# Patient Record
Sex: Female | Born: 1960 | Race: White | Hispanic: No | Marital: Married | Smoking: Never smoker
Health system: Southern US, Community
[De-identification: ages and names within clinical notes are randomized; demographics above are authoritative.]

## PROBLEM LIST (undated history)

## (undated) DIAGNOSIS — C50919 Malignant neoplasm of unspecified site of unspecified female breast: Secondary | ICD-10-CM

## (undated) DIAGNOSIS — D259 Leiomyoma of uterus, unspecified: Secondary | ICD-10-CM

## (undated) DIAGNOSIS — F419 Anxiety disorder, unspecified: Secondary | ICD-10-CM

## (undated) DIAGNOSIS — R9431 Abnormal electrocardiogram [ECG] [EKG]: Secondary | ICD-10-CM

## (undated) DIAGNOSIS — R002 Palpitations: Secondary | ICD-10-CM

## (undated) DIAGNOSIS — Z803 Family history of malignant neoplasm of breast: Secondary | ICD-10-CM

## (undated) DIAGNOSIS — Z973 Presence of spectacles and contact lenses: Secondary | ICD-10-CM

## (undated) DIAGNOSIS — W57XXXA Bitten or stung by nonvenomous insect and other nonvenomous arthropods, initial encounter: Secondary | ICD-10-CM

## (undated) DIAGNOSIS — E079 Disorder of thyroid, unspecified: Secondary | ICD-10-CM

## (undated) DIAGNOSIS — M1990 Unspecified osteoarthritis, unspecified site: Secondary | ICD-10-CM

## (undated) DIAGNOSIS — Z923 Personal history of irradiation: Secondary | ICD-10-CM

## (undated) DIAGNOSIS — E785 Hyperlipidemia, unspecified: Secondary | ICD-10-CM

## (undated) DIAGNOSIS — S70361A Insect bite (nonvenomous), right thigh, initial encounter: Secondary | ICD-10-CM

## (undated) DIAGNOSIS — R232 Flushing: Secondary | ICD-10-CM

## (undated) DIAGNOSIS — K219 Gastro-esophageal reflux disease without esophagitis: Secondary | ICD-10-CM

## (undated) HISTORY — DX: Family history of malignant neoplasm of breast: Z80.3

## (undated) HISTORY — DX: Unspecified osteoarthritis, unspecified site: M19.90

## (undated) HISTORY — DX: Leiomyoma of uterus, unspecified: D25.9

## (undated) HISTORY — DX: Insect bite (nonvenomous), right thigh, initial encounter: S70.361A

## (undated) HISTORY — DX: Palpitations: R00.2

## (undated) HISTORY — DX: Flushing: R23.2

## (undated) HISTORY — DX: Hyperlipidemia, unspecified: E78.5

## (undated) HISTORY — DX: Abnormal electrocardiogram (ECG) (EKG): R94.31

## (undated) HISTORY — DX: Bitten or stung by nonvenomous insect and other nonvenomous arthropods, initial encounter: W57.XXXA

## (undated) HISTORY — PX: DILATION AND CURETTAGE OF UTERUS: SHX78

## (undated) HISTORY — DX: Disorder of thyroid, unspecified: E07.9

## (undated) HISTORY — PX: THYROIDECTOMY, PARTIAL: SHX18

## (undated) HISTORY — DX: Malignant neoplasm of unspecified site of unspecified female breast: C50.919

## (undated) HISTORY — DX: Anxiety disorder, unspecified: F41.9

---

## 1999-05-02 HISTORY — PX: THYROIDECTOMY, PARTIAL: SHX18

## 2004-05-01 HISTORY — PX: ENDOMETRIAL ABLATION: SHX621

## 2007-05-02 DIAGNOSIS — R9431 Abnormal electrocardiogram [ECG] [EKG]: Secondary | ICD-10-CM

## 2007-05-02 DIAGNOSIS — R002 Palpitations: Secondary | ICD-10-CM

## 2007-05-02 HISTORY — DX: Palpitations: R00.2

## 2007-05-02 HISTORY — DX: Abnormal electrocardiogram (ECG) (EKG): R94.31

## 2008-04-16 ENCOUNTER — Ambulatory Visit: Payer: Self-pay | Admitting: Cardiology

## 2008-04-21 ENCOUNTER — Encounter: Payer: Self-pay | Admitting: Cardiology

## 2008-04-30 ENCOUNTER — Ambulatory Visit: Payer: Self-pay | Admitting: Cardiology

## 2008-05-25 ENCOUNTER — Ambulatory Visit: Payer: Self-pay | Admitting: Cardiology

## 2009-02-03 DIAGNOSIS — R5381 Other malaise: Secondary | ICD-10-CM

## 2009-02-03 DIAGNOSIS — R9431 Abnormal electrocardiogram [ECG] [EKG]: Secondary | ICD-10-CM | POA: Insufficient documentation

## 2009-02-03 DIAGNOSIS — R5383 Other fatigue: Secondary | ICD-10-CM

## 2009-02-03 DIAGNOSIS — R002 Palpitations: Secondary | ICD-10-CM

## 2009-02-04 ENCOUNTER — Ambulatory Visit: Payer: Self-pay | Admitting: Cardiology

## 2009-02-04 DIAGNOSIS — F411 Generalized anxiety disorder: Secondary | ICD-10-CM | POA: Insufficient documentation

## 2010-09-13 NOTE — Assessment & Plan Note (Signed)
[facility]                          [facility] OFFICE NOTE   NAME:[name], [name]                          MRN:          [id number]  DATE:05/25/2008                            DOB:          [birth year]    HISTORY OF PRESENT ILLNESS:  The patient is a pleasant 50 year old  female who we recently saw for palpitations.  The patient wore a 24-hour  Holter monitor which demonstrated PVCs, but no atrial fibrillation.  She  was also started on propranolol 40 mg b.i.d.  She has noticed  significant improvement in her palpitations.  She states they are far  less frequent and not as forceful in its nature.  She also is not able  to cope with this realizing that she has no underlying heart disease.  Her stress echocardiographic study was essentially within normal limits.   MEDICATIONS:  1. Citalopram 20 mg p.o. daily.  2. Propranolol 40 mg p.o. b.i.d.   PHYSICAL EXAMINATION:  VITAL SIGNS:  Blood pressure 105/63, heart rate  60, weight 145 pounds.  NECK:  Normal carotid upstroke and no carotid bruits.  LUNGS:  Clear breath sounds bilaterally.  HEART:  Regular rate and rhythm with normal S1 and S2.  No murmur, rubs,  or gallops.  ABDOMEN:  Soft.  EXTREMITIES:  No cyanosis, clubbing, or edema.   PROBLEM LIST:  1. Palpitations.      a.     Improved.      b.     Secondary to premature ventricular contractions.  2. Mild anxiety disorder/mood disorder, treated by her gynecologist      with selective serotonin reuptake inhibitor.  3. Nonspecific EKG abnormality.   PLAN:  1. The patient has no evidence of ischemia.  She has no significant      abnormalities by stress echo.  2. The patient has significantly improved in regard to her symptoms      with propranolol and will continue this for the next 6 months.  At      that point in time, we will try to wean her dose.  3. No further immediate followup is required, and the patient will      call us on an  as-needed basis.     Learta Codding, MD,FACC  Electronically Signed   GED/MedQ  DD: 05/25/2008  DT: 05/25/2008  Job #: [id number]

## 2010-09-13 NOTE — Assessment & Plan Note (Signed)
[facility]                          [facility] OFFICE NOTE   NAME:Maclay, Dazja                          MRN:          [id number]  DATE:04/16/2008                            DOB:          08-17-1960    REASON FOR CONSULTATION:  Palpitations.   HISTORY OF PRESENT ILLNESS:  The patient is a very pleasant 50 year old  female with no prior significant cardiovascular history.  The patient  states that over the last several months she has felt extremely tired  and weak.  She does not experience any hot flashes, however.  Her main  complaint has been palpitations that occurred since this summer.  She  states that at times she feels her heart racing which is usually short  lived without any other symptoms, but other times she feels flip-flop  of her heart with extra heartbeats and pauses suggesting PVCs.  She has  no chest pain, no shortness of breath, orthopnea, or PND.  She does  complain of generalized fatigue and not lack of energy.  She also  reports to me left arm pain radiating down the left arm, but this is  positional and not at all related to exertion.  She also reported  gastroesophageal reflux disease after she had a holiday party yesterday,  which is an unusual symptom for her.  She was referred to my office per  the request of Dr. Cleotis Nipper when she reported palpitations.   ALLERGIES:  No known drug allergies.   MEDICATIONS:  Citalopram 20 mg p.o. daily for mood disorder.   PAST MEDICAL HISTORY:  Partial thyroidectomy, C-section, endometrial  ablation.   SOCIAL HISTORY:  The patient's father had angina and had in 1986 bypass  surgery, eventually died from aortic aneurysm and also had significant  dementia.  Two maternal aunts and an uncle have atrial fibrillation.  Mother is still alive at age 38.   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  She states that she has been under significant amount of stress  over the  last couple of months.  This was the reason that she was started on  citalopram by her gynecologist.   REVIEW OF SYSTEMS:  As per HPI.  No nausea or vomiting.  No fevers,  chills, no melena, no hematuria, no dysuria, frequency, positive for  fatigue, and weakness.  No fever or chills.   PHYSICAL EXAMINATION:  VITAL SIGNS:  Blood pressure 139/85, heart rate  is 81 beats per minute, weight 143 pounds.  GENERAL:  Well-nourished, white female in no apparent distress.  HEENT:  Pupils are equal, conjunctivae clear.  NECK:  Supple.  No carotid bruits.  LUNGS:  Clear breath sounds bilaterally.  HEART:  Regular rate and rhythm with occasional extrasystoles, but  normal S1 and S2 with no pathological murmurs.  ABDOMEN:  Soft and nontender.  No rebound or guarding.  Good bowel  sound.  EXTREMITIES:  No cyanosis, clubbing, or edema.  NEUROLOGIC:  The patient is alert, oriented, and grossly nonfocal.   A 12-lead electrocardiogram demonstrates normal sinus rhythm  with  nonspecific ST-segment depression and occasional PVCs.   PROBLEM LIST:  1. Palpitations.      a.     Rule out atrial fibrillation, unlikely.      b.     Premature ventricular contractions, possibly related to       anxiety.  2. Mild anxiety disorder/mood disorder treated by her gynecologist.  3. Three abnormal electrocardiogram with ST depression.  4. Premature ventricular contractions.   PLAN:  1. I suspect the patient's symptoms may be fed by her anxiety.  She      mainly has PVCs documented, but we will apply a 24-hour Holter      monitor to make sure that she does not have any bursts of atrial      fibrillation.  2. We will start the patient a trial of propranolol 40 mg p.o. b.i.d.      which also would help with her anxiety.  3. Given her abnormal electrocardiogram, will undergo stress      echocardiographic testing, but I suspect these are nonspecific ST      segment changes that were seen on the  electrocardiogram.      Learta Codding, MD,FACC  Electronically Signed    GED/MedQ  DD: 04/16/2008  DT: 04/17/2008  Job #: [id number]   cc:   Zelphia Cairo, MD  Ceasar Mons A. Cleotis Nipper, M.D.

## 2010-10-27 ENCOUNTER — Encounter: Payer: Self-pay | Admitting: Cardiology

## 2011-01-26 NOTE — Patient Instructions (Addendum)
   Your procedure is scheduled on: Tuesday  Enter through the Main Entrance of [facility] at: 6am Pick up the phone at the desk and dial [phone] and inform us of your arrival  Please call this number if you have any problems the morning of surgery: [phone]  Remember: Do not eat food after midnight  Do not drink clear liquids after:midnight Take these medicines the morning of surgery with a SIP OF WATER:none  Do not wear jewelry, make-up, or FINGER nail polish Do not wear lotions, powders, or perfumes.  You may wear deodorant. Do not shave 48 hours prior to surgery. Do not bring valuables to the hospital. Leave suitcase in the car. After Surgery it may be brought to your room. For patients being admitted to the hospital, checkout time is 11:00am the day of discharge.     Remember to use your hibiclens as instructed.Please shower with 1/2 bottle the evening before your surgery and the other 1/2 bottle the morning of surgery.

## 2011-01-30 ENCOUNTER — Encounter (HOSPITAL_COMMUNITY): Payer: Self-pay

## 2011-01-30 ENCOUNTER — Encounter (HOSPITAL_COMMUNITY)
Admission: RE | Admit: 2011-01-30 | Discharge: 2011-01-30 | Disposition: A | Discharge status: Home / Self Care | Payer: BC Managed Care – PPO | Source: Ambulatory Visit | Attending: Obstetrics and Gynecology | Admitting: Obstetrics and Gynecology

## 2011-01-30 HISTORY — DX: Gastro-esophageal reflux disease without esophagitis: K21.9

## 2011-01-30 HISTORY — DX: Unspecified osteoarthritis, unspecified site: M19.90

## 2011-01-30 LAB — CBC
HCT: 39.3 % (ref 36.0–46.0)
MCHC: 34.6 g/dL (ref 30.0–36.0)
MCV: 94 fL (ref 78.0–100.0)
RDW: 12.5 % (ref 11.5–15.5)

## 2011-02-06 MED ORDER — DEXTROSE 5 % IV SOLN
1.0000 g | INTRAVENOUS | Status: AC
  Administered 2011-02-07: 1 g via INTRAVENOUS
  Filled 2011-02-06: qty 1

## 2011-02-07 ENCOUNTER — Encounter (HOSPITAL_COMMUNITY): Payer: Self-pay | Admitting: Obstetrics and Gynecology

## 2011-02-07 ENCOUNTER — Ambulatory Visit (HOSPITAL_COMMUNITY): Payer: BC Managed Care – PPO | Admitting: Anesthesiology

## 2011-02-07 ENCOUNTER — Encounter (HOSPITAL_COMMUNITY): Payer: Self-pay | Admitting: Anesthesiology

## 2011-02-07 ENCOUNTER — Encounter (HOSPITAL_COMMUNITY)
Admission: RE | Disposition: A | Discharge status: Home Health | Payer: Self-pay | Source: Ambulatory Visit | Attending: Obstetrics and Gynecology

## 2011-02-07 ENCOUNTER — Ambulatory Visit (HOSPITAL_COMMUNITY)
Admission: RE | Admit: 2011-02-07 | Discharge: 2011-02-08 | Disposition: A | Discharge status: Home Health | Payer: BC Managed Care – PPO | Source: Ambulatory Visit | Attending: Obstetrics and Gynecology | Admitting: Obstetrics and Gynecology

## 2011-02-07 ENCOUNTER — Other Ambulatory Visit: Payer: Self-pay | Admitting: Obstetrics and Gynecology

## 2011-02-07 DIAGNOSIS — N8000 Endometriosis of the uterus, unspecified: Secondary | ICD-10-CM | POA: Insufficient documentation

## 2011-02-07 DIAGNOSIS — Z01818 Encounter for other preprocedural examination: Secondary | ICD-10-CM | POA: Insufficient documentation

## 2011-02-07 DIAGNOSIS — Z01812 Encounter for preprocedural laboratory examination: Secondary | ICD-10-CM | POA: Insufficient documentation

## 2011-02-07 DIAGNOSIS — D251 Intramural leiomyoma of uterus: Secondary | ICD-10-CM | POA: Insufficient documentation

## 2011-02-07 DIAGNOSIS — D219 Benign neoplasm of connective and other soft tissue, unspecified: Secondary | ICD-10-CM

## 2011-02-07 DIAGNOSIS — D252 Subserosal leiomyoma of uterus: Secondary | ICD-10-CM | POA: Insufficient documentation

## 2011-02-07 HISTORY — PX: LAPAROSCOPIC ASSISTED VAGINAL HYSTERECTOMY: SHX5398

## 2011-02-07 HISTORY — PX: ABDOMINAL HYSTERECTOMY: SHX81

## 2011-02-07 LAB — TYPE AND SCREEN: Antibody Screen: NEGATIVE

## 2011-02-07 LAB — ABO/RH: ABO/RH(D): A POS

## 2011-02-07 SURGERY — HYSTERECTOMY, VAGINAL, LAPAROSCOPY-ASSISTED
Anesthesia: General

## 2011-02-07 MED ORDER — ONDANSETRON HCL 4 MG/2ML IJ SOLN: 4.0000 mg | Freq: Four times a day (QID) | INTRAMUSCULAR | Status: DC | PRN

## 2011-02-07 MED ORDER — LACTATED RINGERS IV SOLN
INTRAVENOUS | Status: DC
  Administered 2011-02-07 (×2): via INTRAVENOUS

## 2011-02-07 MED ORDER — DEXTROSE-NACL 5-0.9 % IV SOLN
INTRAVENOUS | Status: DC
  Administered 2011-02-07: 17:00:00 via INTRAVENOUS

## 2011-02-07 MED ORDER — NEOSTIGMINE METHYLSULFATE 1 MG/ML IJ SOLN
INTRAMUSCULAR | Status: DC | PRN
  Administered 2011-02-07: 2 mg via INTRAVENOUS

## 2011-02-07 MED ORDER — LACTATED RINGERS IR SOLN
Status: DC | PRN
  Administered 2011-02-07: 3000 mL

## 2011-02-07 MED ORDER — HYDROMORPHONE HCL 1 MG/ML IJ SOLN
1.0000 mg | INTRAMUSCULAR | Status: DC | PRN
  Administered 2011-02-07: 1 mg via INTRAVENOUS
  Filled 2011-02-07: qty 1

## 2011-02-07 MED ORDER — KETOROLAC TROMETHAMINE 30 MG/ML IJ SOLN
30.0000 mg | Freq: Four times a day (QID) | INTRAMUSCULAR | Status: AC
  Administered 2011-02-07: 30 mg via INTRAVENOUS
  Filled 2011-02-07: qty 1

## 2011-02-07 MED ORDER — SCOPOLAMINE 1 MG/3DAYS TD PT72
MEDICATED_PATCH | TRANSDERMAL | Status: AC
  Filled 2011-02-07: qty 1

## 2011-02-07 MED ORDER — DEXAMETHASONE SODIUM PHOSPHATE 10 MG/ML IJ SOLN
INTRAMUSCULAR | Status: AC
  Filled 2011-02-07: qty 1

## 2011-02-07 MED ORDER — ROCURONIUM BROMIDE 100 MG/10ML IV SOLN
INTRAVENOUS | Status: DC | PRN
  Administered 2011-02-07: 50 mg via INTRAVENOUS

## 2011-02-07 MED ORDER — HYDROMORPHONE HCL 1 MG/ML IJ SOLN
INTRAMUSCULAR | Status: AC
  Filled 2011-02-07: qty 1

## 2011-02-07 MED ORDER — DOCUSATE SODIUM 100 MG PO CAPS
100.0000 mg | ORAL_CAPSULE | Freq: Every day | ORAL | Status: DC
  Filled 2011-02-07: qty 1

## 2011-02-07 MED ORDER — KETOROLAC TROMETHAMINE 30 MG/ML IJ SOLN
INTRAMUSCULAR | Status: DC | PRN
  Administered 2011-02-07: 30 mg via INTRAVENOUS

## 2011-02-07 MED ORDER — MIDAZOLAM HCL 5 MG/5ML IJ SOLN
INTRAMUSCULAR | Status: DC | PRN
  Administered 2011-02-07: 2 mg via INTRAVENOUS

## 2011-02-07 MED ORDER — PROPOFOL 10 MG/ML IV EMUL
INTRAVENOUS | Status: AC
  Filled 2011-02-07: qty 20

## 2011-02-07 MED ORDER — ROCURONIUM BROMIDE 50 MG/5ML IV SOLN
INTRAVENOUS | Status: AC
  Filled 2011-02-07: qty 1

## 2011-02-07 MED ORDER — SCOPOLAMINE 1 MG/3DAYS TD PT72
1.0000 | MEDICATED_PATCH | TRANSDERMAL | Status: DC
  Administered 2011-02-07: 1.5 mg via TRANSDERMAL

## 2011-02-07 MED ORDER — LIDOCAINE HCL (CARDIAC) 20 MG/ML IV SOLN
INTRAVENOUS | Status: AC
  Filled 2011-02-07: qty 5

## 2011-02-07 MED ORDER — LIDOCAINE HCL (CARDIAC) 20 MG/ML IV SOLN
INTRAVENOUS | Status: DC | PRN
  Administered 2011-02-07: 50 mg via INTRAVENOUS

## 2011-02-07 MED ORDER — MEPERIDINE HCL 25 MG/ML IJ SOLN
25.0000 mg | Freq: Once | INTRAMUSCULAR | Status: AC
  Administered 2011-02-07: 25 mg via INTRAVENOUS

## 2011-02-07 MED ORDER — OXYCODONE-ACETAMINOPHEN 5-325 MG PO TABS: 1.0000 | ORAL_TABLET | ORAL | Status: DC | PRN

## 2011-02-07 MED ORDER — SIMETHICONE 80 MG PO CHEW
80.0000 mg | CHEWABLE_TABLET | Freq: Four times a day (QID) | ORAL | Status: DC | PRN
  Administered 2011-02-08: 80 mg via ORAL

## 2011-02-07 MED ORDER — CITALOPRAM HYDROBROMIDE 20 MG PO TABS
20.0000 mg | ORAL_TABLET | Freq: Every day | ORAL | Status: DC
  Filled 2011-02-07 (×2): qty 1

## 2011-02-07 MED ORDER — ONDANSETRON HCL 4 MG/2ML IJ SOLN
INTRAMUSCULAR | Status: AC
  Filled 2011-02-07: qty 2

## 2011-02-07 MED ORDER — ONDANSETRON HCL 4 MG/2ML IJ SOLN
INTRAMUSCULAR | Status: DC | PRN
  Administered 2011-02-07: 4 mg via INTRAVENOUS

## 2011-02-07 MED ORDER — BUPIVACAINE HCL (PF) 0.25 % IJ SOLN
INTRAMUSCULAR | Status: DC | PRN
  Administered 2011-02-07: 10 mL

## 2011-02-07 MED ORDER — FENTANYL CITRATE 0.05 MG/ML IJ SOLN
INTRAMUSCULAR | Status: DC | PRN
  Administered 2011-02-07: 50 ug via INTRAVENOUS
  Administered 2011-02-07 (×2): 100 ug via INTRAVENOUS

## 2011-02-07 MED ORDER — DEXAMETHASONE SODIUM PHOSPHATE 10 MG/ML IJ SOLN
INTRAMUSCULAR | Status: DC | PRN
  Administered 2011-02-07: 10 mg via INTRAVENOUS

## 2011-02-07 MED ORDER — PROPOFOL 10 MG/ML IV EMUL
INTRAVENOUS | Status: DC | PRN
  Administered 2011-02-07: 150 mg via INTRAVENOUS

## 2011-02-07 MED ORDER — IBUPROFEN 600 MG PO TABS
600.0000 mg | ORAL_TABLET | Freq: Four times a day (QID) | ORAL | Status: DC | PRN
  Administered 2011-02-08: 600 mg via ORAL
  Filled 2011-02-07: qty 1

## 2011-02-07 MED ORDER — FENTANYL CITRATE 0.05 MG/ML IJ SOLN
INTRAMUSCULAR | Status: AC
  Filled 2011-02-07: qty 5

## 2011-02-07 MED ORDER — MENTHOL 3 MG MT LOZG: 1.0000 | LOZENGE | OROMUCOSAL | Status: DC | PRN

## 2011-02-07 MED ORDER — MORPHINE SULFATE 4 MG/ML IJ SOLN: 0.0500 mg/kg | INTRAMUSCULAR | Status: DC | PRN

## 2011-02-07 MED ORDER — GLYCOPYRROLATE 0.2 MG/ML IJ SOLN
INTRAMUSCULAR | Status: DC | PRN
  Administered 2011-02-07: .4 mg via INTRAVENOUS

## 2011-02-07 MED ORDER — ONDANSETRON HCL 4 MG PO TABS: 4.0000 mg | ORAL_TABLET | Freq: Four times a day (QID) | ORAL | Status: DC | PRN

## 2011-02-07 MED ORDER — MEPERIDINE HCL 25 MG/ML IJ SOLN
INTRAMUSCULAR | Status: AC
  Administered 2011-02-07: 25 mg via INTRAVENOUS
  Filled 2011-02-07: qty 1

## 2011-02-07 MED ORDER — MIDAZOLAM HCL 2 MG/2ML IJ SOLN
INTRAMUSCULAR | Status: AC
  Filled 2011-02-07: qty 2

## 2011-02-07 MED ORDER — HYDROMORPHONE HCL 1 MG/ML IJ SOLN
INTRAMUSCULAR | Status: DC | PRN
  Administered 2011-02-07: 1 mg via INTRAVENOUS

## 2011-02-07 SURGICAL SUPPLY — 54 items
CABLE HIGH FREQUENCY MONO STRZ (ELECTRODE) IMPLANT
CANISTER SUCTION 2500CC (MISCELLANEOUS) ×2 IMPLANT
CHLORAPREP W/TINT 26ML (MISCELLANEOUS) ×4 IMPLANT
CLOTH BEACON ORANGE TIMEOUT ST (SAFETY) ×2 IMPLANT
CONT PATH 16OZ SNAP LID 3702 (MISCELLANEOUS) ×2 IMPLANT
COVER TABLE BACK 60X90 (DRAPES) ×2 IMPLANT
DECANTER SPIKE VIAL GLASS SM (MISCELLANEOUS) IMPLANT
DERMABOND ADVANCED (GAUZE/BANDAGES/DRESSINGS) ×2
DERMABOND ADVANCED .7 DNX12 (GAUZE/BANDAGES/DRESSINGS) ×2 IMPLANT
DRAPE UTILITY XL STRL (DRAPES) ×2 IMPLANT
ELECT LIGASURE SHORT 9 REUSE (ELECTRODE) ×2 IMPLANT
ELECT REM PT RETURN 9FT ADLT (ELECTROSURGICAL)
ELECTRODE REM PT RTRN 9FT ADLT (ELECTROSURGICAL) IMPLANT
FORCEPS CUTTING 33CM 5MM (CUTTING FORCEPS) IMPLANT
GLOVE BIO SURGEON STRL SZ 6.5 (GLOVE) ×4 IMPLANT
GLOVE BIOGEL PI IND STRL 6.5 (GLOVE) ×1 IMPLANT
GLOVE BIOGEL PI IND STRL 7.0 (GLOVE) ×2 IMPLANT
GLOVE BIOGEL PI INDICATOR 6.5 (GLOVE) ×1
GLOVE BIOGEL PI INDICATOR 7.0 (GLOVE) ×2
GOWN PREVENTION PLUS LG XLONG (DISPOSABLE) ×8 IMPLANT
HEMOSTAT SURGICEL 4X8 (HEMOSTASIS) IMPLANT
NEEDLE HYPO 25X1 1.5 SAFETY (NEEDLE) IMPLANT
NS IRRIG 1000ML POUR BTL (IV SOLUTION) ×2 IMPLANT
PACK ABDOMINAL GYN (CUSTOM PROCEDURE TRAY) ×2 IMPLANT
PACK LAVH (CUSTOM PROCEDURE TRAY) ×2 IMPLANT
PAD OB MATERNITY 4.3X12.25 (PERSONAL CARE ITEMS) ×2 IMPLANT
SEALER TISSUE G2 CVD JAW 45CM (ENDOMECHANICALS) ×2 IMPLANT
SET IRRIG TUBING LAPAROSCOPIC (IRRIGATION / IRRIGATOR) IMPLANT
SLEEVE Z-THREAD 5X100MM (TROCAR) IMPLANT
SPONGE LAP 18X18 X RAY DECT (DISPOSABLE) ×4 IMPLANT
STAPLER VISISTAT 35W (STAPLE) ×2 IMPLANT
SUT CHROMIC 2 0 TIES 18 (SUTURE) IMPLANT
SUT MNCRL 0 MO-4 VIOLET 18 CR (SUTURE) ×3 IMPLANT
SUT MON AB 2-0 CT1 36 (SUTURE) ×2 IMPLANT
SUT MON AB-0 CT1 36 (SUTURE) ×4 IMPLANT
SUT MONOCRYL 0 MO 4 18  CR/8 (SUTURE) ×3
SUT PDS AB 0 CTX 60 (SUTURE) ×2 IMPLANT
SUT PLAIN 2 0 (SUTURE)
SUT PLAIN 2 0 XLH (SUTURE) IMPLANT
SUT PLAIN ABS 2-0 54XMFL TIE (SUTURE) IMPLANT
SUT VIC AB 0 CT1 18XCR BRD8 (SUTURE) ×4 IMPLANT
SUT VIC AB 0 CT1 8-18 (SUTURE) ×4
SUT VIC AB 3-0 PS2 18 (SUTURE) ×1
SUT VIC AB 3-0 PS2 18XBRD (SUTURE) ×1 IMPLANT
SUT VIC AB 3-0 X1 27 (SUTURE) IMPLANT
SUT VICRYL 0 TIES 12 18 (SUTURE) ×2 IMPLANT
SUT VICRYL 0 UR6 27IN ABS (SUTURE) ×2 IMPLANT
SYR CONTROL 10ML LL (SYRINGE) IMPLANT
TOWEL OR 17X24 6PK STRL BLUE (TOWEL DISPOSABLE) ×4 IMPLANT
TRAY FOLEY CATH 14FR (SET/KITS/TRAYS/PACK) ×2 IMPLANT
TROCAR Z-THREAD BLADED 5X100MM (TROCAR) ×2 IMPLANT
TROCAR Z-THREAD FIOS 11X100 BL (TROCAR) ×2 IMPLANT
WARMER LAPAROSCOPE (MISCELLANEOUS) ×2 IMPLANT
WATER STERILE IRR 1000ML POUR (IV SOLUTION) ×2 IMPLANT

## 2011-02-07 NOTE — Anesthesia Postprocedure Evaluation (Signed)
  Anesthesia Post-op Note  Patient: [name] [name], [name] [name]  Procedure(s) Performed:  LAPAROSCOPIC ASSISTED VAGINAL HYSTERECTOMY; HYSTERECTOMY ABDOMINAL  Patient Location: 316  Anesthesia Type: General  Level of Consciousness: awake, alert  and oriented  Airway and Oxygen Therapy: Patient Spontanous Breathing  Post-op Pain: mild  Post-op Assessment: Post-op Vital signs reviewed and Patient's Cardiovascular Status Stable  Post-op Vital Signs: Reviewed and stable  Complications: No apparent anesthesia complications

## 2011-02-07 NOTE — Progress Notes (Signed)
Day of Surgery Procedure(s): LAPAROSCOPIC ASSISTED VAGINAL HYSTERECTOMY HYSTERECTOMY ABDOMINAL  Subjective: Patient reports tolerating PO.    Objective: I have reviewed patient's vital signs and intake and output.  General: alert and cooperative GI: soft, non-tender; bowel sounds normal; no masses,  no organomegaly  Assessment: s/p Procedure(s): LAPAROSCOPIC ASSISTED VAGINAL HYSTERECTOMY HYSTERECTOMY ABDOMINAL: stable, progressing well and tolerating diet  Plan: Advance diet Encourage ambulation Advance to PO medication  LOS: 0 days    Dionisios Ricci 02/07/2011, 5:35 PM

## 2011-02-07 NOTE — H&P (Signed)
50 yo w/ fibroid uterus presents today for surgical mngt.  PMHx:  Neg PSHx:  Partial thyroidectomy, c-section, SVD All:  None Meds:  None FHx:  N/a Shx:  Neg tobacco, drug use  [place]:  Multiple fibroids, largest 6.4cm.  Normal ovaries  A/P:  Symptomatic fibroids, plan for hysterectomy.  R/b/a d/w pt.  Informed consent.

## 2011-02-07 NOTE — Op Note (Signed)
02/07/2011  9:00 AM  PATIENT:  [name] [name]  50 y.o. female  PRE-OPERATIVE DIAGNOSIS:  fibroid uterus  POST-OPERATIVE DIAGNOSIS:  fibroid uterus  PROCEDURE:  Procedure(s): LAPAROSCOPIC ASSISTED VAGINAL HYSTERECTOMY   SURGEON:  Zelphia Cairo   PHYSICIAN ASSISTANT:  Richarda Overlie  ANESTHESIA:   general  OR FLUID I/O:  Total I/O In: 1000 [I.V.:1000] Out: 400 [Urine:200; Blood:200]  BLOOD ADMINISTERED:none  DRAINS: none   LOCAL MEDICATIONS USED:  MARCAINE  SPECIMEN:  Source of Specimen:  uterus and cervix  DISPOSITION OF SPECIMEN:  PATHOLOGY  COUNTS:  YES  TOURNIQUET:  * No tourniquets in log *  DICTATION: .Other Dictation: Dictation Number [id number]  PLAN OF CARE: Admit for overnight observation  PATIENT DISPOSITION:  PACU - hemodynamically stable.   Delay start of Pharmacological VTE agent (>24hrs) due to surgical blood loss or risk of bleeding:  no

## 2011-02-07 NOTE — Anesthesia Preprocedure Evaluation (Signed)
Anesthesia Evaluation  Name, MR# and DOB Patient awake  General Assessment Comment  Reviewed: Allergy & Precautions, H&P , NPO status , Patient's Chart, lab work & pertinent test results  Airway Mallampati: I TM Distance: >3 FB Neck ROM: full    Dental No notable dental hx. (+) Teeth Intact   Pulmonary    Pulmonary exam normal       Cardiovascular     Neuro/Psych Negative Neurological ROS     GI/Hepatic negative GI ROS Neg liver ROS  GERD Medicated  Endo/Other  Negative Endocrine ROS  Renal/GU negative Renal ROS  Genitourinary negative   Musculoskeletal negative musculoskeletal ROS (+)   Abdominal Normal abdominal exam  (+)   Peds  Hematology negative hematology ROS (+)   Anesthesia Other Findings   Reproductive/Obstetrics negative OB ROS                           Anesthesia Physical Anesthesia Plan  ASA: II  Anesthesia Plan: General   Post-op Pain Management:    Induction: Intravenous  Airway Management Planned: Oral ETT  Additional Equipment:   Intra-op Plan:   Post-operative Plan:   Informed Consent: I have reviewed the patients History and Physical, chart, labs and discussed the procedure including the risks, benefits and alternatives for the proposed anesthesia with the patient or authorized representative who has indicated his/her understanding and acceptance.   Dental Advisory Given  Plan Discussed with: CRNA  Anesthesia Plan Comments:         Anesthesia Quick Evaluation

## 2011-02-07 NOTE — Anesthesia Procedure Notes (Signed)
Procedure Name: Intubation Date/Time: 02/07/2011 7:32 AM Performed by: Madison Hickman Pre-anesthesia Checklist: Patient identified, Patient being monitored, Emergency Drugs available, Timeout performed and Suction available Patient Re-evaluated:Patient Re-evaluated prior to inductionOxygen Delivery Method: Circle System Utilized Preoxygenation: Pre-oxygenation with 100% oxygen Intubation Type: IV induction Ventilation: Mask ventilation without difficulty Laryngoscope Size: Mac and 3 Grade View: Grade I Tube type: Oral Tube size: 7.0 mm Number of attempts: 1 Airway Equipment and Method: stylet Placement Confirmation: ETT inserted through vocal cords under direct vision,  breath sounds checked- equal and bilateral and positive ETCO2 Secured at: 20 cm Tube secured with: Tape Dental Injury: Teeth and Oropharynx as per pre-operative assessment

## 2011-02-07 NOTE — Anesthesia Postprocedure Evaluation (Signed)
  Anesthesia Post-op Note  Patient: [name] [name]  Procedure(s) Performed:  LAPAROSCOPIC ASSISTED VAGINAL HYSTERECTOMY; HYSTERECTOMY ABDOMINAL  Patient is awake and responsive. Pain and nausea are reasonably well controlled. Vital signs are stable and clinically acceptable. Oxygen saturation is clinically acceptable. There are no apparent anesthetic complications at this time. Patient is ready for discharge.

## 2011-02-07 NOTE — Transfer of Care (Signed)
Immediate Anesthesia Transfer of Care Note  Patient: [name] [name]  Procedure(s) Performed:  LAPAROSCOPIC ASSISTED VAGINAL HYSTERECTOMY; HYSTERECTOMY ABDOMINAL  Patient Location: PACU  Anesthesia Type: General  Level of Consciousness: awake, alert  and sedated  Airway & Oxygen Therapy: Patient Spontanous Breathing and Patient connected to nasal cannula oxygen  Post-op Assessment: Report given to PACU RN and Post -op Vital signs reviewed and stable  Post vital signs: Reviewed and stable  Complications: No apparent anesthesia complications

## 2011-02-07 NOTE — Op Note (Signed)
[name] [name], [name] [name]                 ACCOUNT NO.:  [id number]  MEDICAL RECORD NO.:  [id number]  LOCATION:  9316                          FACILITY:  [facility]  PHYSICIAN:  Zelphia Cairo, MD    DATE OF BIRTH:  Sep 04, 1960  DATE OF PROCEDURE:  02/07/2011 DATE OF DISCHARGE:                              OPERATIVE REPORT   PREOPERATIVE DIAGNOSIS:  Symptomatic fibroids.  POSTOPERATIVE DIAGNOSIS:  Symptomatic fibroids, path pending.  PROCEDURE:  Laparoscopic-assisted vaginal hysterectomy.  SURGEON:  Zelphia Cairo, MD  ASSISTANT:  Duke Salvia. Marcelle Overlie, MD  ESTIMATED BLOOD LOSS:  200 mL.  SPECIMEN:  Uterus and cervix to pathology.  ANESTHESIA:  General.  URINE OUTPUT:  Clear.  COMPLICATIONS:  None.  CONDITION:  Stable to recovery room.  PROCEDURE IN DETAIL:  [name] [name] was taken to the operating room.  After informed consent was obtained, she was given general anesthesia and placed in the dorsal lithotomy position using Allen stirrups, prepped and draped in sterile fashion, and a Foley catheter was inserted sterilely.  Bivalve speculum was placed in the vagina and a single-tooth tenaculum was placed on the anterior lip of the cervix.  A Hulka clamp was placed on the cervix.  Single-tooth tenaculum and speculum were removed and our attention was turned to the abdomen.  Marcaine 0.25% was used to provide local anesthesia at the site of the infraumbilical skin incision.  A small infraumbilical skin incision was then made with a scalpel and extended bluntly to the level of the fascia using a Kelly clamp.  Optical trocar was then inserted under direct visualization.  Once intraperitoneal placement was confirmed, CO2 was turned on and the abdomen and pelvis were insufflated.  A survey of the pelvis revealed normal-appearing ovaries bilaterally and enlarged fibroid uterus.  The EnSeal device was then placed through the operative port of the operative laparoscope and the right utero-ovarian  ligament was grasped, cauterized, and cut using the EnSeal device.  The fallopian tube was then grasped, cauterized, and cut with the EnSeal.  This incision was extended down the broad ligament to the level of the round ligament staying just adjacent to the uterus.  The round ligament was grasped, cauterized, and cut using the EnSeal device.  Excellent hemostasis was noted and this procedure was repeated on the opposite side.  Once good hemostasis was noted bilaterally,  all instruments were removed from the abdomen.  CO2 was turned off and our attention was turned to the vagina.  Hulka clamp was removed from the cervix.  A weighted speculum was placed in the posterior vagina and a Deaver was placed anteriorly.  The cervix was grasped with a toothed tenaculum.  A circumferential incision was made around the cervix using the Bovie and the posterior cul-de-sac was entered sharply with curved Mayo scissors.  Bilateral uterosacral ligaments were grasped using curved Haney clamps, cut with curved Mayo scissors, and suture ligated.  The anterior cul-de-sac was entered sharply and a Deaver was placed anteriorly.  The LigaSure device was then used to cauterize bilateral uterine arteries and cardinal ligaments.  The remaining uterine pedicles were then grasped and cauterized with the LigaSure and cut with curved Mayo scissors. Hemostasis  was assured bilaterally.  The posterior uterus was grasped with a thyroid tenaculum and delivered through the posterior cul-de-sac. The uterus and fundus were morcellated until the entire uterus was able to be delivered through the vaginal incision.  One remaining pedicle on the patient's right was grasped with a curved Heaney clamp, and the uterus was transected.  This pedicle was doubly ligated using Monocryl. Uterus and cervix were passed off to be sent to pathology.  The bowel was packed away using a moist lap sponge.  The posterior vaginal cuff was  reapproximated using 0-Monocryl in a running locked fashion. Modified McCall stitch was then placed using a Monocryl.  The remainder of the vaginal cuff was then closed using a series of figure-of-eight stitches of 2-0 Monocryl.  Once the cuff was reapproximated, excellent hemostasis was noted.  All instruments were removed from the vagina and our attention was turned to the abdomen.  The abdomen was reinsufflated with CO2 and all pedicles and the vaginal cuff were reinspected. Hemostasis was assured.  All trocars were then removed from the abdomen. A deep stitch was placed in the infraumbilical skin incision.  The skin was closed with Vicryl.  Dermabond was placed over the skin incision. The patient was extubated and taken to the recovery room in stable condition.  Sponge, lap, needle, and instrument counts were correct x2.     Zelphia Cairo, MD     GA/MEDQ  D:  02/07/2011  T:  02/07/2011  Job:  [id number]

## 2011-02-08 ENCOUNTER — Encounter (HOSPITAL_COMMUNITY): Payer: Self-pay | Admitting: Obstetrics and Gynecology

## 2011-02-08 LAB — CBC
HCT: 34.1 % — ABNORMAL LOW (ref 36.0–46.0)
MCHC: 33.4 g/dL (ref 30.0–36.0)
Platelets: 225 10*3/uL (ref 150–400)
RDW: 12.6 % (ref 11.5–15.5)
WBC: 12.9 10*3/uL — ABNORMAL HIGH (ref 4.0–10.5)

## 2011-02-08 MED ORDER — IBUPROFEN 600 MG PO TABS: 600.0000 mg | ORAL_TABLET | Freq: Four times a day (QID) | ORAL | Status: AC | PRN

## 2011-02-08 MED ORDER — OXYCODONE-ACETAMINOPHEN 5-325 MG PO TABS: 1.0000 | ORAL_TABLET | ORAL | Status: AC | PRN

## 2011-02-08 NOTE — Progress Notes (Signed)
1 Day Post-Op Procedure(s): LAPAROSCOPIC ASSISTED VAGINAL HYSTERECTOMY HYSTERECTOMY ABDOMINAL  Subjective: Patient reports tolerating PO, + flatus and no problems voiding.    Objective: I have reviewed patient's vital signs, intake and output and labs.  General: alert and cooperative Resp: clear to auscultation bilaterally Cardio: regular rate and rhythm GI: soft, non-tender; bowel sounds normal; no masses,  no organomegaly Vaginal Bleeding: none  Assessment: s/p Procedure(s): LAPAROSCOPIC ASSISTED VAGINAL HYSTERECTOMY  progressing well  Plan: Advance diet Encourage ambulation Advance to PO medication Discharge home  LOS: 1 day    [name] [name] 02/08/2011, 8:50 AM

## 2011-02-08 NOTE — Discharge Instructions (Signed)
Hysterectomy °A hysterectomy is a procedure where your womb (uterus) is surgically taken out. It will no longer be possible to have menstrual periods or to become pregnant. Removal of the tubes and ovaries (bilateral salpingo-oopherectomy) can be done during this operation as well.  °· An abdominal hysterectomy is done through a large cut (incision) in the abdomen made by the surgeon.  °· A vaginal hysterectomy is done through the vagina. There are no abdominal incisions, but there will be incisions on the inside of the vagina.  °· A laparoscopic assisted vaginal hysterectomy is done through 2 or 3 small incisions in the abdomen, but the uterus is removed and passed through the vagina.  °Women who are going to have a hysterectomy should be tested first to make sure there is no cancer of the cervix or in the uterus. °INDICATIONS FOR HYSTERECTOMY: °· Persistent abnormal bleeding.  °· Lasting (chronic) pelvic pain.  °· Endometriosis. This is when the lining of the uterus (endometrium) is misplaced outside of its normal location.  °· Adenomyosis. This is when the endometrium tissue grows in the muscle of the uterus.  °· Uterine prolapse. This is when the uterus falls down into the vagina.  °· Cancer of the uterus or cervix that requires a radical hysterectomy, removal of the uterus, tubes, ovaries and surrounding lymph nodes.  °RISKS  °All surgeries can have risks. Some of these risks are: °· A lot of bleeding. °· Injury to surrounding organs.  °· Infection.  · Blood clots of the leg, heart or lung. °· Problems with anesthesia.  °· Early menopause.   °BEFORE SURGERY °· Do not take aspirin or blood thinners for a week before surgery, or as directed by your caregiver.  °· Do not eat or drink anything after midnight the night before surgery, or as directed by your caregiver.  °· Let your caregiver know if you get a cold or other infectious problems before surgery.  °· If you are being admitted the day of surgery, you  should be present *** before your procedure or as told by your caregiver. Check in at the admissions desk for filling out necessary forms if you are not preregistered. There may be consent forms to sign prior to the procedure. If friends or family come with you, there is a waiting area for them while you are having your procedure.  °LET YOUR CAREGIVERS KNOW ABOUT THE FOLLOWING: °· Allergies (especially to medicines). °· Medications taken including herbs, eye drops, over the counter medications, and creams.  °· Use of steroids (by mouth or creams).  °· Past problems with anesthetics or novocaine.  · Possibility of pregnancy, if this applies. °· History of blood clots (thrombophlebitis).  °· History of bleeding or blood problems.  °· Past surgery.  °· Other health problems.   °PROCEDURE °· An IV (intravenous) will be placed in your arm. You will be given a drug to make you sleep (anesthetic) during surgery. You may be given a shot in the spine (spinal anesthesia) that will numb your body from the waist down. This will keep you pain free during surgery.  °· When you wake from surgery, you will have the IV and a long, narrow, hollow tube (Foley catheter) draining the bladder for one or two days after surgery. This will make passing your urine easier. It also helps by keeping your bladder empty during surgery.  °· After surgery, you will be taken to the recovery area where a nurse will watch and check   your progress. Once you wake up, stable and taking fluids well, without other problems, you will be allowed to return to your room. Usually you will remain in the hospital 3 to 5 days. You may be given a medicine that kills germs (antibiotic) during and after the surgery and when you go home. Pain medication will be ordered by your caregiver while you are in the hospital and when you go home.  °HOME CARE INSTRUCTIONS °Healing will take time. You will have discomfort, tenderness, swelling and bruising at the operative site  for a couple of weeks. This is normal and will get better as time goes on.  °· Only take over-the-counter or prescription medicines for pain, discomfort or fever as directed by your caregiver.  °· Do not take aspirin. It can cause bleeding.  °· Do not drive when taking pain medication.  °· Follow your caregiver’s advice regarding diet, exercise, lifting, driving and general activities.  °· Resume your usual diet as directed and allowed.  °· Get plenty of rest and sleep.  °· Do not douche, use tampons, or have sexual intercourse until your caregiver gives you permission.  °· Change your bandages (dressings) as directed.  °· Take your temperature twice a day. Write it down.  °· Your caregiver may recommend showers instead of baths for a few weeks.  °· Do not drink alcohol until your caregiver gives you permission.  °· If you develop constipation, you may take a mild laxative with your caregiver’s permission. Bran foods and drinking fluids helps with constipation problems.  °· Try to have someone home with you for a week or two to help with the household activities.  °· Make sure you and your family understands everything about your operation and recovery.  °· Do not sign any legal documents until you feel normal again.  °· Keep all your follow-up appointments as recommended by your caregiver.  °SEEK MEDICAL CARE IF: °· There is swelling, redness or increasing pain in the wound area.  °· Pus is coming from the wound.  °· You notice a bad smell from the wound or surgical dressing.  °· You have pain, redness and swelling from the intravenous site.  °· The wound is breaking open (the edges are not staying together).  °· You feel dizzy or feel like fainting.  °· You develop pain or bleeding when you urinate.  °· You develop diarrhea.  °· You develop nausea and vomiting.  °· You develop abnormal vaginal discharge.  °· You develop a rash.  °· You have any type of abnormal reaction or develop an allergy to your medication.    °· You need stronger pain medication for your pain.  °SEEK IMMEDIATE MEDICAL CARE: °· You develop a temperature of *** or higher.  °· You develop abdominal pain.  °· You develop chest pain.  °· You develop shortness of breath.  °· You pass out.  °· You develop pain, swelling or redness of your leg.  °· You develop heavy vaginal bleeding with or without blood clots.  °Document Released: 10/11/2000 Document Re-Released: 05/09/2009 °[employer]® Patient Information ©2011 [employer]. °

## 2011-02-08 NOTE — Discharge Summary (Signed)
Physician Discharge Summary  Patient ID: [name] [name] MRN: [id number] DOB/AGE: 1960/06/08 50 y.o.  Admit date: 02/07/2011 Discharge date: 02/08/2011  Admission Diagnoses: fibroids  Discharge Diagnoses:  Active Problems:  Fibroids   Discharged Condition: stable  Hospital Course: was admitted for surgical mngt of fibroids.  Pain was initially controlled with IV medications.  Foley catheter was in place.  As pt was able to ambulate, foley catheter was discontinued.  As diet was advanced, pt was able to tolerate PO meds.  POD1, pt was passing flatus, ambulating and tolerating a regular diet  Consults: none  Significant Diagnostic Studies: labs: CBC stable  Treatments: IV hydration, analgesia:  and surgery: LAVH  Discharge Exam: Blood pressure 108/64, pulse 79, temperature 98.2 F (36.8 C), temperature source Oral, resp. rate 18, height 5\' 6"  (1.676 m), weight 68.04 kg (150 lb), SpO2 95.00%. see progress note  Disposition: stable  Current Discharge Medication List    START taking these medications   Details  ibuprofen (ADVIL,MOTRIN) 600 MG tablet Take 1 tablet (600 mg total) by mouth every 6 (six) hours as needed for pain. Qty: 30 tablet, Refills: 1    oxyCODONE-acetaminophen (PERCOCET) 5-325 MG per tablet Take 1-2 tablets by mouth every 3 (three) hours as needed (moderate to severe pain (when tolerating fluids)). Qty: 30 tablet, Refills: 0      CONTINUE these medications which have NOT CHANGED   Details  calcium carbonate-magnesium hydroxide (ROLAIDS) 334 MG CHEW Chew 1 tablet by mouth 2 (two) times daily as needed. For indigestion     OMEPRAZOLE PO Take 1 capsule by mouth daily as needed. For heartburn/reflux     citalopram (CELEXA) 20 MG tablet Take 20 mg by mouth daily. For mood disorder.     propranolol (INDERAL) 40 MG tablet Take 40 mg by mouth 2 (two) times daily.         Follow-up Information    Follow up in 2 weeks.          Signed: Gwynne Kemnitz 02/08/2011, 8:54 AM

## 2013-07-24 ENCOUNTER — Other Ambulatory Visit: Payer: Self-pay | Admitting: Obstetrics and Gynecology

## 2013-07-24 DIAGNOSIS — N63 Unspecified lump in unspecified breast: Secondary | ICD-10-CM

## 2013-08-01 ENCOUNTER — Ambulatory Visit
Admission: RE | Admit: 2013-08-01 | Discharge: 2013-08-01 | Disposition: A | Discharge status: Home / Self Care | Payer: BC Managed Care – PPO | Source: Ambulatory Visit | Attending: Obstetrics and Gynecology | Admitting: Obstetrics and Gynecology

## 2013-08-01 ENCOUNTER — Other Ambulatory Visit: Payer: Self-pay | Admitting: Obstetrics and Gynecology

## 2013-08-01 DIAGNOSIS — N63 Unspecified lump in unspecified breast: Secondary | ICD-10-CM

## 2013-08-04 ENCOUNTER — Other Ambulatory Visit: Payer: BC Managed Care – PPO

## 2013-08-07 ENCOUNTER — Ambulatory Visit
Admission: RE | Admit: 2013-08-07 | Discharge: 2013-08-07 | Disposition: A | Discharge status: Home / Self Care | Payer: BC Managed Care – PPO | Source: Ambulatory Visit | Attending: Obstetrics and Gynecology | Admitting: Obstetrics and Gynecology

## 2013-08-07 ENCOUNTER — Other Ambulatory Visit: Payer: Self-pay | Admitting: Obstetrics and Gynecology

## 2013-08-07 DIAGNOSIS — N63 Unspecified lump in unspecified breast: Secondary | ICD-10-CM

## 2013-08-07 DIAGNOSIS — C50919 Malignant neoplasm of unspecified site of unspecified female breast: Secondary | ICD-10-CM

## 2013-08-07 HISTORY — DX: Malignant neoplasm of unspecified site of unspecified female breast: C50.919

## 2013-08-08 ENCOUNTER — Other Ambulatory Visit: Payer: Self-pay | Admitting: Obstetrics and Gynecology

## 2013-08-08 DIAGNOSIS — C50919 Malignant neoplasm of unspecified site of unspecified female breast: Secondary | ICD-10-CM

## 2013-08-11 ENCOUNTER — Telehealth: Payer: Self-pay | Admitting: *Deleted

## 2013-08-11 DIAGNOSIS — C50212 Malignant neoplasm of upper-inner quadrant of left female breast: Secondary | ICD-10-CM | POA: Insufficient documentation

## 2013-08-11 DIAGNOSIS — Z170 Estrogen receptor positive status [ER+]: Secondary | ICD-10-CM

## 2013-08-11 NOTE — Telephone Encounter (Signed)
Left message for a return phone call to schedule patient for [facility] 08/20/13.  Awaiting patient response. 

## 2013-08-11 NOTE — Telephone Encounter (Signed)
Confirmed [facility] for 08/20/13 at 12N .  Instructions and contact information given.

## 2013-08-15 ENCOUNTER — Ambulatory Visit
Admission: RE | Admit: 2013-08-15 | Discharge: 2013-08-15 | Disposition: A | Discharge status: Home / Self Care | Payer: BC Managed Care – PPO | Source: Ambulatory Visit | Attending: Obstetrics and Gynecology | Admitting: Obstetrics and Gynecology

## 2013-08-15 DIAGNOSIS — C50919 Malignant neoplasm of unspecified site of unspecified female breast: Secondary | ICD-10-CM

## 2013-08-15 MED ORDER — GADOBENATE DIMEGLUMINE 529 MG/ML IV SOLN: 14.0000 mL | Freq: Once | INTRAVENOUS | Status: AC | PRN

## 2013-08-18 ENCOUNTER — Other Ambulatory Visit: Payer: Self-pay | Admitting: Obstetrics and Gynecology

## 2013-08-18 DIAGNOSIS — R928 Other abnormal and inconclusive findings on diagnostic imaging of breast: Secondary | ICD-10-CM

## 2013-08-19 ENCOUNTER — Other Ambulatory Visit (HOSPITAL_COMMUNITY): Payer: Self-pay | Admitting: Diagnostic Radiology

## 2013-08-19 ENCOUNTER — Ambulatory Visit
Admission: RE | Admit: 2013-08-19 | Discharge: 2013-08-19 | Disposition: A | Discharge status: Home / Self Care | Payer: BC Managed Care – PPO | Source: Ambulatory Visit | Attending: Obstetrics and Gynecology | Admitting: Obstetrics and Gynecology

## 2013-08-19 ENCOUNTER — Other Ambulatory Visit: Payer: BC Managed Care – PPO

## 2013-08-19 ENCOUNTER — Encounter (INDEPENDENT_AMBULATORY_CARE_PROVIDER_SITE_OTHER): Payer: Self-pay

## 2013-08-19 DIAGNOSIS — R928 Other abnormal and inconclusive findings on diagnostic imaging of breast: Secondary | ICD-10-CM

## 2013-08-20 ENCOUNTER — Encounter: Payer: Self-pay | Admitting: Oncology

## 2013-08-20 ENCOUNTER — Ambulatory Visit (HOSPITAL_BASED_OUTPATIENT_CLINIC_OR_DEPARTMENT_OTHER): Payer: BC Managed Care – PPO | Admitting: Oncology

## 2013-08-20 ENCOUNTER — Ambulatory Visit (HOSPITAL_BASED_OUTPATIENT_CLINIC_OR_DEPARTMENT_OTHER): Payer: BC Managed Care – PPO | Admitting: General Surgery

## 2013-08-20 ENCOUNTER — Ambulatory Visit: Payer: BC Managed Care – PPO

## 2013-08-20 ENCOUNTER — Other Ambulatory Visit (HOSPITAL_BASED_OUTPATIENT_CLINIC_OR_DEPARTMENT_OTHER): Payer: BC Managed Care – PPO

## 2013-08-20 ENCOUNTER — Ambulatory Visit: Payer: BC Managed Care – PPO | Attending: General Surgery | Admitting: Physical Therapy

## 2013-08-20 ENCOUNTER — Ambulatory Visit
Admission: RE | Admit: 2013-08-20 | Discharge: 2013-08-20 | Disposition: A | Discharge status: Home / Self Care | Payer: BC Managed Care – PPO | Source: Ambulatory Visit | Attending: Radiation Oncology | Admitting: Radiation Oncology

## 2013-08-20 VITALS — BP 139/87 | HR 80 | Temp 98.5°F | Resp 18 | Ht 66.0 in | Wt 153.3 lb

## 2013-08-20 DIAGNOSIS — C773 Secondary and unspecified malignant neoplasm of axilla and upper limb lymph nodes: Secondary | ICD-10-CM

## 2013-08-20 DIAGNOSIS — C50919 Malignant neoplasm of unspecified site of unspecified female breast: Secondary | ICD-10-CM

## 2013-08-20 DIAGNOSIS — C50212 Malignant neoplasm of upper-inner quadrant of left female breast: Secondary | ICD-10-CM

## 2013-08-20 DIAGNOSIS — IMO0001 Reserved for inherently not codable concepts without codable children: Secondary | ICD-10-CM | POA: Insufficient documentation

## 2013-08-20 DIAGNOSIS — Z170 Estrogen receptor positive status [ER+]: Secondary | ICD-10-CM

## 2013-08-20 DIAGNOSIS — C50219 Malignant neoplasm of upper-inner quadrant of unspecified female breast: Secondary | ICD-10-CM

## 2013-08-20 LAB — CBC WITH DIFFERENTIAL/PLATELET
BASO%: 0.6 % (ref 0.0–2.0)
BASOS ABS: 0 10*3/uL (ref 0.0–0.1)
EOS%: 1.4 % (ref 0.0–7.0)
Eosinophils Absolute: 0.1 10*3/uL (ref 0.0–0.5)
HEMATOCRIT: 39.1 % (ref 34.8–46.6)
HEMOGLOBIN: 13.3 g/dL (ref 11.6–15.9)
LYMPH#: 2 10*3/uL (ref 0.9–3.3)
LYMPH%: 29.7 % (ref 14.0–49.7)
MCH: 31.8 pg (ref 25.1–34.0)
MCHC: 34 g/dL (ref 31.5–36.0)
MCV: 93.5 fL (ref 79.5–101.0)
MONO#: 0.5 10*3/uL (ref 0.1–0.9)
MONO%: 7.8 % (ref 0.0–14.0)
NEUT#: 4 10*3/uL (ref 1.5–6.5)
NEUT%: 60.5 % (ref 38.4–76.8)
PLATELETS: 274 10*3/uL (ref 145–400)
RBC: 4.18 10*6/uL (ref 3.70–5.45)
RDW: 13 % (ref 11.2–14.5)
WBC: 6.6 10*3/uL (ref 3.9–10.3)

## 2013-08-20 LAB — COMPREHENSIVE METABOLIC PANEL (CC13)
ALT: 18 U/L (ref 0–55)
ANION GAP: 8 meq/L (ref 3–11)
AST: 15 U/L (ref 5–34)
Albumin: 3.8 g/dL (ref 3.5–5.0)
Alkaline Phosphatase: 64 U/L (ref 40–150)
BILIRUBIN TOTAL: 0.49 mg/dL (ref 0.20–1.20)
BUN: 9.9 mg/dL (ref 7.0–26.0)
CALCIUM: 8.7 mg/dL (ref 8.4–10.4)
CHLORIDE: 107 meq/L (ref 98–109)
CO2: 24 mEq/L (ref 22–29)
CREATININE: 0.8 mg/dL (ref 0.6–1.1)
Glucose: 114 mg/dl (ref 70–140)
Potassium: 3.5 mEq/L (ref 3.5–5.1)
Sodium: 138 mEq/L (ref 136–145)
Total Protein: 6.9 g/dL (ref 6.4–8.3)

## 2013-08-20 NOTE — Progress Notes (Signed)
Chief Complaint: New diagnosis of breast cancer  History:    [name] [name] is a 53 y.o. postmenopausal female referred by Dr. 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 The patient states that approximately 2 weeks ago she was able to feel what she thought was a subtle lump  Under her left arm. She subsequently saw her primary physician and was referred for further imaging and workup.  Subsequent imaging included diagnostic mamogram showing a 5 mm area of asymmetry in the inner left breast and ultrasound showing ill-defined area of shadowing at the 9:00 position in the left breast 4 cm from the nipple measuring 1.3 cm as well as to left axillary lymph nodes with cortical thickening.   A ultrasound guided biopsy was performed on 08/07/2013 with pathology revealing infiltrating lobular carcinoma of the breast.  Following this ultrasound-guided core biopsy was performed of an abnormal axillary lymph node which was positive for metastatic mammary carcinoma. Subsequent breast MR has been performed revealing a 1.5 cm mass in the medial breast as well as previously noted abnormal axillary lymph nodes.  She is seen now in multidisciplinary breast clinic for initial treatment planning.  She has experienced a  lump on exam in the axilla as above and no other symptoms such as pain or nipple discharge or skin changes..  She does not have a personal history of any previous breast problems.  She does have a strong family history of breast cancer in her mother and 3 maternal aunts.  Findings at that time were the following:  Tumor size: 1.5 cm  Tumor grade: 1  Estrogen Receptor: positive Progesterone Receptor: positive  Her-2 neu: negative  Lymph node status: positive Neurovascular invasion: not mentioned a biopsy Lymphatic invasion: not mentioned on biopsy  Past Medical History  Diagnosis Date  . Palpitations 2009    Improved; Secondary to premature ventricular  contractions. No problems since-pt states was caring for ailing father, anxiety  . Anxiety disorder     Mild: treated by her gynecologist with selective serotonin reuptake inhibitor  . Nonspecific abnormal electrocardiogram (ECG) (EKG) 2009  . GERD (gastroesophageal reflux disease)     rolaids  . Arthritis   . Hot flashes     Past Surgical History  Procedure Laterality Date  . Thyroidectomy, partial    . Cesarean section    . Endometrial ablation    . Dilation and curettage of uterus    . Laparoscopic assisted vaginal hysterectomy  02/07/2011    Procedure: LAPAROSCOPIC ASSISTED VAGINAL HYSTERECTOMY;  Surgeon: [name];  Location: [facility];  Service: Gynecology;  Laterality: N/A;  . Abdominal hysterectomy  02/07/2011    Procedure: HYSTERECTOMY ABDOMINAL;  Surgeon: [name];  Location: [facility];  Service: Gynecology;  Laterality: N/A;    Current Outpatient Prescriptions  Medication Sig Dispense Refill  . calcium carbonate-magnesium hydroxide (ROLAIDS) 334 MG CHEW Chew 1 tablet by mouth 2 (two) times daily as needed. For indigestion       . citalopram (CELEXA) 20 MG tablet Take 20 mg by mouth daily. For mood disorder.       Marland Kitchen OMEPRAZOLE PO Take 1 capsule by mouth daily as needed. For heartburn/reflux       . propranolol (INDERAL) 40 MG tablet Take 40 mg by mouth 2 (two) times daily.         No current facility-administered medications for this visit.    Family History  Problem Relation Age of Onset  . Angina Father   .  Aortic aneurysm Father   . Dementia Father   . Arrhythmia Maternal Aunt     A fib  . Breast cancer Maternal Aunt   . Hypertension Other   . Arrhythmia Other     Uncle- a fib  . Arrhythmia Maternal Aunt     A fib  . Breast cancer Maternal Aunt   . Breast cancer Mother   . Colon cancer Maternal Grandmother   . Colon cancer Maternal Grandfather     History   Social History  . Marital Status: Married    Spouse Name: N/A    Number of Children:  N/A  . Years of Education: N/A   Occupational History  . [employer] deeds     Full time   Social History Main Topics  . Smoking status: Never Smoker   . Smokeless tobacco: Not on file  . Alcohol Use: Yes     Comment: Occasionally  . Drug Use: No  . Sexual Activity: Not on file   Other Topics Concern  . Not on file   Social History Narrative  . No narrative on file     Review of Systems A comprehensive review of systems was negative.     Objective:  There were no vitals taken for this visit.  General: Alert, well-developed Caucasian female, in no distress Skin: Warm and dry without rash or infection. HEENT: No palpable masses or thyromegaly. Sclera nonicteric. Pupils equal round and reactive. Oropharynx clear. Breasts: moderate breast size bilaterally. Low in the left axilla is a small palpable mass, possibly 1 cm and may be related to postbiopsy change. Breast tissue a somewhat dense bilaterally but I cannot feel any definite mass in either breast. No skin changes or nipple inversion. Lymph nodes: No cervical, supraclavicular, or inguinal nodes palpable. Lungs: Breath sounds clear and equal without increased work of breathing Cardiovascular: Regular rate and rhythm without murmur. No JVD or edema. Peripheral pulses intact. Abdomen: Nondistended. Soft and nontender. No masses palpable. No organomegaly. No palpable hernias. Extremities: No edema or joint swelling or deformity. No chronic venous stasis changes. Neurologic: Alert and fully oriented. Gait normal.   Laboratory data:  CBC:  Lab Results  Component Value Date   WBC 6.6 08/20/2013   WBC 12.9* 02/08/2011   RBC 4.18 08/20/2013   RBC 3.59* 02/08/2011   HGB 13.3 08/20/2013   HGB 11.4* 02/08/2011   HCT 39.1 08/20/2013   HCT 34.1* 02/08/2011   PLT 274 08/20/2013   PLT 225 02/08/2011  ]  [facility] Labs:  Lab Results  Component Value Date   NA 138 08/20/2013   K 3.5 08/20/2013   CO2 24 08/20/2013   BUN 9.9 08/20/2013    CREATININE 0.8 08/20/2013   CALCIUM 8.7 08/20/2013   PROT 6.9 08/20/2013   BILITOT 0.49 08/20/2013   ALKPHOS 64 08/20/2013   AST 15 08/20/2013   ALT 18 08/20/2013     Assessment  53 y.o. female with a new diagnosis of cancer of the the left breast upper inner quadrant.  Clinical IIB, estrogen receptor positive, Her2/Neu protein/oncogene negative and Ki 67 - growth factor low. I discussed with the patient and family members present today initial surgical treatment options. We discussed options of breast conservation with lumpectomy or total mastectomy and sentinal lymph node biopsy/dissection. Options for reconstruction were discussed. We discussed the possibility of bilateral total mastectomy should she be genetically positive. Her strong family history genetic testing has been recommended and she desires to proceed with  this. At this point she is unsure if she would want breast conservation or not she be genetically negative and she is not completely sure that she would want bilateral mastectomy if she is genetically positive.Surgical risks including anesthetic complications, cardiorespiratory complications, bleeding, infection, wound healing complications, blood clots, lymphedema, local and distant recurrence and possible need for further surgery based on the final pathology was discussed and understood.  Chemotherapy, hormonal therapy and radiation therapy have been discussed.   Plan Proceed with genetic testing. I will see her back in the office following this for definitive surgical planning. I believe she would be a candidate for breast conservation should she decide to pursue this although we discussed that occasionally lobular carcinoma is under represented on imaging and can lead to a higher rate of positive margins.   [name] MD, FACS  08/20/2013, 2:47 PM

## 2013-08-20 NOTE — Progress Notes (Signed)
[facility]  Telephone:([phone]) [phone] [phone]     ID: [name] [name] Number OB: 1960-12-19  MR#: [id number]  PSA#:[id number]  PCP: Ignatius Specking., MD GYN:  Zelphia Cairo SU: Glenna Fellows OTHER MD: Dorothy Puffer  CHIEF COMPLAINT: "I noted a bump in my left axilla".  BREAST CANCER HISTORY: Making noted a change, like a small hard pea in her left axilla. She brought this to Dr. Vickey Sages attention and bilateral diagnostic mammography and left ultrasonography at the breast Center for 07/18/2013 showed a small area of asymmetry in the inner left breast measuring perhaps 5 mm which was not palpable by physical exam. In the right breast there was some loosely grouped calcifications spanning up to 0.7 cm. Ultrasound of the left breast confirmed an ill-defined area of shadowing at the 9:00 position measuring up to 1.3 cm. There was also a 4 mm circumscribed hypoechoic mass at the 10:00 position. This is felt to be probably benign. In the left axilla there were 2 lymph nodes with slightly increased cortical thickening, although the fatty hila were maintained. These were not related to the patient's feeling of a "pea like mass" in her left axilla, which had by then pretty much resolved.   On 08/07/2013 the patient underwent left breast needle core biopsy and this showed ([id number]) and invasive lobular carcinoma (E-cadherin negative) estrogen receptor 70% positive with strong staining intensity, progesterone receptor 70% positive and strong staining intensity, with a proliferation marker of 7% and no HER-2 amplification, the signals ratio being 0.94 in the number per cell 1.50.  Bilateral breast MRI 08/15/2013 found an irregular spiculated mass in the left breast measuring approximately 1.5 cm. There was no abnormal enhancement of the pectoralis. There were at least 2 left axillary lymph nodes and showed focal cortical thickening. There was a single left internal mammary lymph node at the  level of the biopsy-proven malignancy which was not pathologic in appearance. The right breast and axilla were benign.  The patient's subsequent history is as detailed below   INTERVAL HISTORY:  [name] [name] was evaluated at the multidisciplinary breast cancer clinic accompanied by her husband [name] [name], her son [name] [name], and her daughter [name] [name]   REVIEW OF SYSTEMS: Aside from the mass itself, there were no specific symptoms leading to the diagnostic mammogram. The patient denies unusual headaches, visual changes, nausea, vomiting, stiff neck, dizziness, or gait imbalance. There has been no cough, phlegm production, or pleurisy, no chest pain or pressure, and no change in bowel or bladder habits. The patient denies fever, rash, bleeding, unexplained fatigue or unexplained weight loss. The patient complains of mild pain sometimes in her hands and feet, which is not more persistent or intense than prior. She has mild heartburn. She has moderate hot flashes. A detailed review of systems was otherwise entirely negative.  PAST MEDICAL HISTORY: Past Medical History  Diagnosis Date  . Palpitations 2009    Improved; Secondary to premature ventricular contractions. No problems since-pt states was caring for ailing father, anxiety  . Anxiety disorder     Mild: treated by her gynecologist with selective serotonin reuptake inhibitor  . Nonspecific abnormal electrocardiogram (ECG) (EKG) 2009  . GERD (gastroesophageal reflux disease)     rolaids  . Arthritis     PAST SURGICAL HISTORY: Past Surgical History  Procedure Laterality Date  . Thyroidectomy, partial    . Cesarean section    . Endometrial ablation    . Dilation and curettage of uterus    . Laparoscopic assisted vaginal  hysterectomy  02/07/2011    Procedure: LAPAROSCOPIC ASSISTED VAGINAL HYSTERECTOMY;  Surgeon: Zelphia Cairo;  Location: [facility];  Service: Gynecology;  Laterality: N/A;  . Abdominal hysterectomy  02/07/2011    Procedure: HYSTERECTOMY  ABDOMINAL;  Surgeon: Zelphia Cairo;  Location: [facility];  Service: Gynecology;  Laterality: N/A;    FAMILY HISTORY Family History  Problem Relation Age of Onset  . Angina Father   . Aortic aneurysm Father   . Dementia Father   . Arrhythmia Maternal Aunt     A fib  . Hypertension Other   . Arrhythmia Other     Uncle- a fib  . Arrhythmia Maternal Aunt     A fib  The patient's father died from heart disease in the setting of dementia at the age of 1. The patient's mother died at the age of 26. She had been diagnosed with breast cancer at the age of 38. The patient is a single child. Her mother had 4 sisters, 3 of whom had breast cancer diagnosed at the age of 33, 23, and 57. There is also history of colorectal cancer on the maternal side of the family   GYNECOLOGIC HISTORY:   menarche age 19, first live birth age 27, the patient is GX P2. She underwent a hysterectomy without salpingo-oophorectomy in 2012. She took birth control pills approximately 3 years remotely, with no significant complications   SOCIAL HISTORY:  The patient works as a Systems developer. Her husband [name] [name]         . Son [name] [name] lives in [place] where he works as a Wellsite geologist. Daughter [name] [name] this in [place], where she works as an Public librarian in a [employer]. The patient has no grandchildren. She is a Engineer, water.    ADVANCED DIRECTIVES: Not in place   HEALTH MAINTENANCE: History  Substance Use Topics  . Smoking status: Never Smoker   . Smokeless tobacco: Not on file  . Alcohol Use: Yes     Comment: Occasionally     Colonoscopy:2010   PAP:2012   Bone density:2012   Lipid panel:  No Known Allergies  Current Outpatient Prescriptions  Medication Sig Dispense Refill  . calcium carbonate-magnesium hydroxide (ROLAIDS) 334 MG CHEW Chew 1 tablet by mouth 2 (two) times daily as needed. For indigestion       . citalopram (CELEXA) 20 MG tablet Take 20 mg by mouth daily. For mood disorder.       Marland Kitchen OMEPRAZOLE PO  Take 1 capsule by mouth daily as needed. For heartburn/reflux       . propranolol (INDERAL) 40 MG tablet Take 40 mg by mouth 2 (two) times daily.         No current facility-administered medications for this visit.    OBJECTIVE: middle-aged white woman who appears younger than stated age  36 Vitals:   08/20/13 1247  BP: 139/87  Pulse: 80  Temp: 98.5 F (36.9 C)  Resp: 18     Body mass index is 24.75 kg/(m^2).    ECOG FS:0 - Asymptomatic  Ocular: Sclerae unicteric, pupils equal, round and reactive to light Ear-nose-throat: Oropharynx clear,  teeth in good repair  Lymphatic: No cervical or supraclavicular adenopathy Lungs no rales or rhonchi, good excursion bilaterally Heart regular rate and rhythm, no murmur appreciated Abd soft, nontender, positive bowel sounds MSK no focal spinal tenderness, no joint edema Neuro: non-focal, well-oriented, appropriate affect Breasts: The right breast is unremarkable. The left breast is status post recent biopsy. There is minimal  ecchymosis. I do not see any suspicious skin or nipple changes I do not palpate a well-defined mass. The left axilla is entirely benign.   LAB RESULTS:  CMP     Component Value Date/Time   NA 138 08/20/2013 1232   K 3.5 08/20/2013 1232   CO2 24 08/20/2013 1232   GLUCOSE 114 08/20/2013 1232   BUN 9.9 08/20/2013 1232   CREATININE 0.8 08/20/2013 1232   CALCIUM 8.7 08/20/2013 1232   PROT 6.9 08/20/2013 1232   ALBUMIN 3.8 08/20/2013 1232   AST 15 08/20/2013 1232   ALT 18 08/20/2013 1232   ALKPHOS 64 08/20/2013 1232   BILITOT 0.49 08/20/2013 1232    I No results found for this basename: SPEP, UPEP,  kappa and lambda light chains    Lab Results  Component Value Date   WBC 6.6 08/20/2013   NEUTROABS 4.0 08/20/2013   HGB 13.3 08/20/2013   HCT 39.1 08/20/2013   MCV 93.5 08/20/2013   PLT 274 08/20/2013      Chemistry      Component Value Date/Time   NA 138 08/20/2013 1232   K 3.5 08/20/2013 1232   CO2 24 08/20/2013 1232    BUN 9.9 08/20/2013 1232   CREATININE 0.8 08/20/2013 1232      Component Value Date/Time   CALCIUM 8.7 08/20/2013 1232   ALKPHOS 64 08/20/2013 1232   AST 15 08/20/2013 1232   ALT 18 08/20/2013 1232   BILITOT 0.49 08/20/2013 1232       No results found for this basename: LABCA2    No components found with this basename: LABCA125    No results found for this basename: INR,  in the last 168 hours  Urinalysis No results found for this basename: colorurine, appearanceur, labspec, phurine, glucoseu, hgbur, bilirubinur, ketonesur, proteinur, urobilinogen, nitrite, leukocytesur    STUDIES: Mr Breast Bilateral W Wo Contrast  08/15/2013   CLINICAL DATA:  53 year old female with new diagnosis of 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  LABS:  None current  EXAM: BILATERAL BREAST MRI WITH AND WITHOUT CONTRAST  TECHNIQUE: Multiplanar, multisequence MR images of both breasts were obtained prior to and following the intravenous administration of 59ml of MultiHance.  THREE-DIMENSIONAL MR IMAGE RENDERING ON INDEPENDENT WORKSTATION:  Three-dimensional MR images were rendered by post-processing of the original MR data on an independent workstation. The three-dimensional MR images were interpreted, and findings are reported in the following complete MRI report for this study. Three dimensional images were evaluated at the independent DynaCad workstation  COMPARISON:  08/01/2013 diagnostic mammogram and ultrasound.  FINDINGS: Breast composition: c:  Heterogeneous fibroglandular tissue  Background parenchymal enhancement: Marked background parenchymal enhancement.  Right breast: No abnormal mass or definite abnormal enhancement.  Left breast: An irregular enhancing mass with spiculated margins is identified in the far medial and posterior left breast with associated metallic clip artifact consistent with biopsy-proven malignancy. Exact dimensions are difficult to ascertain given the extensive  background parenchymal enhancement, though this mass measures approximately 1.5 x 1.5 x 1.5 cm. No abnormal enhancement of the pectoralis muscles identified.  Lymph nodes: The right axillary and right internal mammary lymph node chains are unremarkable. There is a single left internal mammary lymph node identified at the level of the biopsy-proven malignancy, though this is nonpathologic in appearance. There are at least 2 left axillary lymph nodes that demonstrate focal cortical thickening or loss of reniform shape. This may be secondary to inflammation related to recent left breast  biopsy, though nodal disease cannot be completely excluded.  Ancillary findings:  None.  IMPRESSION: 1. Biopsy-proven malignancy in the far medial left breast measures approximately 1.5 cm in maximal diameter.  2. Left axillary adenopathy is suspicious for nodal disease versus reactive inflammation.  3.  No MR findings of malignancy in the right breast.  RECOMMENDATION: 1. Recommend surgical consultation for biopsy proven malignancy in the medial left breast. Patient is scheduled for multidisciplinary Clinic on 08/20/2013.  2. Left axillary adenopathy is suspicious for nodal disease versus reactive inflammation. If indicated, second-look left axillary ultrasound and possible biopsy may be performed.  BI-RADS CATEGORY  4: Suspicious.   Electronically Signed   By: Raymondo Band   On: 08/15/2013 14:31   Mm Digital Diagnostic Bilat  08/01/2013   CLINICAL DATA:  Patient with a left axillary lump.  EXAM: DIGITAL DIAGNOSTIC  BILATERAL MAMMOGRAM WITH CAD  ULTRASOUND LEFT BREAST  COMPARISON:  08/24/2011  ACR Breast Density Category b: There are scattered areas of fibroglandular density.  FINDINGS: A 5 mm asymmetry is seen in the inner middle third of the left breast. This persists with spot compression imaging. In the right breast, there are loosely grouped faint punctate calcifications spanning up to 7 mm. No linear or branching forms. No  associated mass or architectural distortion.  Mammographic images were processed with CAD.  On physical exam, no palpable abnormality is identified in the area of mammographic concern in the left breast or in the left axilla.  Targeted ultrasound of the left breast demonstrates an ill-defined area of shadowing at 9 o'clock, 4 cm from the nipple. This spans up to 1.3 cm. Additionally, there is a circumscribed oval hypoechoic mass at 10 o'clock, 4 cm from the nipple. This measures 4 x 4 x 3 mm. No internal vascularity is identified. Examination of the left axilla demonstrate 2 lymph nodes with top normal cortical thickness (3 mm). Fatty hila are maintained. These lymph nodes do not correlate with the patient's area of palpable concern.  IMPRESSION: 1. Ill-defined area of shadowing at 9 o'clock in the left breast. Top-normal cortical thickness of 2 left axillary lymph nodes. 2. Probably benign left breast mass at 10 o'clock, measuring up to 4 mm. 3. Probably benign right breast calcifications.  RECOMMENDATION: Ultrasound-guided biopsy of the area of ill defined shadowing in the left breast is recommended. This has been scheduled for 08/07/2013. Given that the left axillary lymph nodes are top normal in cortical thickness, biopsy is not recommended unless pathology results from the left breast biopsy demonstrate malignancy. If biopsy results are benign, short-term follow-up of the probably benign findings listed above is recommended.  I have discussed the findings and recommendations with the patient. Results were also provided in writing at the conclusion of the visit. If applicable, a reminder letter will be sent to the patient regarding the next appointment.  BI-RADS CATEGORY  4: Suspicious abnormality - biopsy should be considered.   Electronically Signed   By: Jerene Dilling M.D.   On: 08/01/2013 17:55   Mm Digital Diagnostic Unilat L  08/07/2013   CLINICAL DATA:  Left breast mass  EXAM: POST-BIOPSY CLIP  PLACEMENT LEFT DIAGNOSTIC MAMMOGRAM  COMPARISON:  Previous exams.  FINDINGS: Films are performed following ultrasound guided biopsy of a left breast mass. Images show a ribbon type clip in association with the mass in the 9 o'clock position of the left breast, posteriorly.  IMPRESSION: Adequate clip placement following ultrasound-guided left breast biopsy.  Final Assessment: Post  Procedure Mammograms for Marker Placement   Electronically Signed   By: Duke Salvia M.D.   On: 08/07/2013 16:24   US Breast Ltd Uni Left Inc Axilla  08/19/2013   CLINICAL DATA:  Abnormal lymph node seen on MRI.  EXAM: ULTRASOUND OF THE LEFT AXILLA  COMPARISON:  Multiple priors  FINDINGS: On physical exam,I palpate normal tissue in the left axilla.  Ultrasound is performed, showing a lymph node with a diffusely thickened cortex in the area of MRI concern.  IMPRESSION: Diffuse cortical thickening, left axillary lymph node.  RECOMMENDATION: A biopsy is done and reported separately.  I have discussed the findings and recommendations with the patient. Results were also provided in writing at the conclusion of the visit. If applicable, a reminder letter will be sent to the patient regarding the next appointment.  BI-RADS CATEGORY  4: Suspicious.   Electronically Signed   By: Duke Salvia M.D.   On: 08/19/2013 11:04   US Breast Ltd Uni Left Inc Axilla  08/01/2013   CLINICAL DATA:  Patient with a left axillary lump.  EXAM: DIGITAL DIAGNOSTIC  BILATERAL MAMMOGRAM WITH CAD  ULTRASOUND LEFT BREAST  COMPARISON:  08/24/2011  ACR Breast Density Category b: There are scattered areas of fibroglandular density.  FINDINGS: A 5 mm asymmetry is seen in the inner middle third of the left breast. This persists with spot compression imaging. In the right breast, there are loosely grouped faint punctate calcifications spanning up to 7 mm. No linear or branching forms. No associated mass or architectural distortion.  Mammographic images were processed  with CAD.  On physical exam, no palpable abnormality is identified in the area of mammographic concern in the left breast or in the left axilla.  Targeted ultrasound of the left breast demonstrates an ill-defined area of shadowing at 9 o'clock, 4 cm from the nipple. This spans up to 1.3 cm. Additionally, there is a circumscribed oval hypoechoic mass at 10 o'clock, 4 cm from the nipple. This measures 4 x 4 x 3 mm. No internal vascularity is identified. Examination of the left axilla demonstrate 2 lymph nodes with top normal cortical thickness (3 mm). Fatty hila are maintained. These lymph nodes do not correlate with the patient's area of palpable concern.  IMPRESSION: 1. Ill-defined area of shadowing at 9 o'clock in the left breast. Top-normal cortical thickness of 2 left axillary lymph nodes. 2. Probably benign left breast mass at 10 o'clock, measuring up to 4 mm. 3. Probably benign right breast calcifications.  RECOMMENDATION: Ultrasound-guided biopsy of the area of ill defined shadowing in the left breast is recommended. This has been scheduled for 08/07/2013. Given that the left axillary lymph nodes are top normal in cortical thickness, biopsy is not recommended unless pathology results from the left breast biopsy demonstrate malignancy. If biopsy results are benign, short-term follow-up of the probably benign findings listed above is recommended.  I have discussed the findings and recommendations with the patient. Results were also provided in writing at the conclusion of the visit. If applicable, a reminder letter will be sent to the patient regarding the next appointment.  BI-RADS CATEGORY  4: Suspicious abnormality - biopsy should be considered.   Electronically Signed   By: Donavan Burnet M.D.   On: 08/01/2013 17:55   [place] Lt Breast Bx W Loc Dev 1st Lesion Img Bx Spec US Guide  08/19/2013   CLINICAL DATA:  Questionable left axillary lymph node. Known left-sided breast cancer  EXAM: ULTRASOUND GUIDED  LEFT  BREAST CORE NEEDLE BIOPSY  COMPARISON:  Previous exams.  FINDINGS: I met with the patient and we discussed the procedure of ultrasound-guided biopsy, including benefits and alternatives. We discussed the high likelihood of a successful procedure. We discussed the risks of the procedure, including infection, bleeding, tissue injury, and inadequate sampling. Informed written consent was given. The usual time-out protocol was performed immediately prior to the procedure.  Using sterile technique and 2% Lidocaine as local anesthetic, under direct ultrasound visualization, a 14 gauge spring-loaded device was used to perform biopsy of the lymph node using a lateral approach.  IMPRESSION: Ultrasound guided biopsy of a left axillary lymph node. No apparent complications.   Electronically Signed   By: Duke Salvia M.D.   On: 08/19/2013 11:05   [place] Lt Breast Bx W Loc Dev 1st Lesion Img Bx Spec US Guide  08/08/2013   ADDENDUM REPORT: 08/08/2013 13:27  ADDENDUM: Biopsy results revealed invasive ductal carcinoma and carcinoma in situ. This is concordant with the imaging findings. This is discussed with patient by phone on 08/08/2013 at 1:25 p.m. An MRI scheduled on 08/15/2013 at 11:45 a.m. The patient is scheduled for multidisciplinary Clinic on 08/20/2013. She states that biopsy site is healing well without significant pain or bruising.   Electronically Signed   By: Duke Salvia M.D.   On: 08/08/2013 13:27   08/08/2013   CLINICAL DATA:  Left breast mass  EXAM: ULTRASOUND GUIDED LEFT BREAST CORE NEEDLE BIOPSY  COMPARISON:  Previous exams.  FINDINGS: I met with the patient and we discussed the procedure of ultrasound-guided biopsy, including benefits and alternatives. We discussed the high likelihood of a successful procedure. We discussed the risks of the procedure, including infection, bleeding, tissue injury, clip migration, and inadequate sampling. Informed written consent was given. The usual time-out  protocol was performed immediately prior to the procedure.  Using sterile technique and 2% Lidocaine as local anesthetic, under direct ultrasound visualization, a 14 gauge spring-loaded device was used to perform biopsy of the mass using a medial approach. At the conclusion of the procedure a tissue marker clip was deployed into the biopsy cavity. Follow up 2 view mammogram was performed and dictated separately.  IMPRESSION: Ultrasound guided biopsy of a left breast mass. No apparent complications.  Electronically Signed: By: Duke Salvia M.D. On: 08/07/2013 16:23    ASSESSMENT: 53 y.o. [place] woman status post left breast biopsy for 01/18/2014 showing invasive lobular carcinoma (E-cadherin negative), estrogen receptor 70% positive, progesterone receptor 70% positive, with an MIB-1 of 7%, and no HER-2 amplification  (1) ultrasound-guided biopsy of a suspicious lymph node in the left axilla 08/19/2012 was positive  PLAN: We spent the better part of today's hour-long appointment discussing the biology of breast cancer in general, and the specifics of the patient's tumor in particular. Nickey's situation is complex. She has a stage IIB invasive lobular breast cancer which types out as a luminal a. In general we don't do chemotherapy for luminal As, but of course she has a luminal a-like tumor (80% chance of actually being luminal a). In addition, this is a lobular tumor, and the data we use is largely based on ductal tumors . Any statement carried over from. ductal  to lobular cases  has a little bit  of  wobblr in it.  We discussed all this and the fact that chemotherapy is much more effective in rapidly growing rather than very slow growing tumors like hers. Nevertheless, NCCN guidelines clearly call for chemotherapy in this  situation.  I think she would be excellent candidate for the SWOG S1007 study, and I am sending an Oncotype on the expectation that it is going to be low. We are going to give  her a copy of the consent form today, for her to read and discuss with her family further. She will see me again in a couple of weeks after her definitive surgery. By then we should be able to decide whether a) she agrees to be treated according to the standard of care, which would be chemotherapy, b) refuses chemotherapy given the fact that this is a luminal a like tumor and chemotherapy is expected to be less effective, or c) she chooses to participate in the SWOG study, which is my recommendation.  Kenzy has a pretty good understanding of the overall plan (she is still trying to the just the fact that this was not a ductal breast cancer), and she agrees with it. She knows a goal of treatment or cases cure. She also notes her prognosis is good. She will call with any problems that may develop before next visit here.     Chauncey Cruel, MD   08/20/2013 2:01 PM

## 2013-08-20 NOTE — Progress Notes (Signed)
Checked in new pt with no financial concerns. °

## 2013-08-21 ENCOUNTER — Telehealth: Payer: Self-pay | Admitting: Oncology

## 2013-08-21 NOTE — Telephone Encounter (Signed)
lvm for pt regarding to April adn June appt...Marland KitchenMailed pt appt sched/avs adn letter

## 2013-08-21 NOTE — Progress Notes (Signed)
[facility] Psychosocial Distress Screening Clinical Social Work  Clinical Social Work met with Patient and family at breast clinic.  The patient scored a 6 on the Psychosocial Distress Thermometer which indicates moderate distress. Clinical Social Worker Intern spoke with to assess for distress and other psychosocial needs. Patient was given written information about Patient and Family support services.  Patient agrees to contact if further assistance is needed.   Clinical Social Worker follow up needed: no  If yes, follow up plan:   Amy S. Poole Clinical Social Work Intern [facility] [phone]       

## 2013-08-25 ENCOUNTER — Other Ambulatory Visit (INDEPENDENT_AMBULATORY_CARE_PROVIDER_SITE_OTHER): Payer: Self-pay | Admitting: General Surgery

## 2013-08-25 ENCOUNTER — Telehealth: Payer: Self-pay | Admitting: *Deleted

## 2013-08-25 ENCOUNTER — Telehealth (INDEPENDENT_AMBULATORY_CARE_PROVIDER_SITE_OTHER): Payer: Self-pay | Admitting: General Surgery

## 2013-08-25 DIAGNOSIS — C50919 Malignant neoplasm of unspecified site of unspecified female breast: Secondary | ICD-10-CM

## 2013-08-25 NOTE — Telephone Encounter (Signed)
Called patient had left a message regarding scheduling her surgery

## 2013-08-25 NOTE — Telephone Encounter (Signed)
Pt called to discuss email she sent me to forward to the physicians.  I discussed with her how oncotype testing would be sent and the difference b/t local and systemic treatment.  Informed pt that Dr. Lear Ng office will call to schedule surgery.  Encourage pt to call with further questions.  Contact information given.

## 2013-08-27 NOTE — Progress Notes (Signed)
Radiation Oncology         [phone]) [phone] ________________________________  Name: [name] [name] MRN: [id number]  Date: 08/20/2013  DOB: 1961-01-10  UM:PNTI,RWERX B., MD  Hoxworth, Darene Lamer, MD     REFERRING PHYSICIAN: Excell Seltzer Darene Lamer, MD   DIAGNOSIS: The encounter diagnosis was Breast cancer of upper-inner quadrant of left female breast.   HISTORY OF PRESENT ILLNESS::[name] [name] is a 53 y.o. female who is seen for an initial consultation visit. The patient is seen today in multidisciplinary breast clinic for a new diagnosis of invasive mammary carcinoma of the left breast. The patient indicates that she noticed a palpable abnormality within the left breast approximately 3 weeks ago. She has no history of prior breast issues or biopsies. The patient proceeded to undergo further workup which included a diagnostic mammogram. This showed an area of asymmetry within the inner aspect of the left breast which was concerning. She then proceeded to undergo an ultrasound which showed an ill-defined area measuring 1.3 cm. This was suspicious for possible malignancy. Abnormal lymph nodes were also present within the left axilla.   The patient underwent an ultrasound-guided biopsy. This returned positive for invasive lobular carcinoma. Receptor studies have indicated that the tumor is ER positive and PR positive as well as HER-2/neu negative. The Ki-67 staining was 7%. The patient has also undergone and ultrasound-guided biopsy of a suspicious left axillary lymph node and this returned positive for carcinoma.  The patient has undergone an MRI scan of the breasts bilaterally. This revealed findings consistent with biopsy-proven malignancy within the medial aspect of the left breast. This measured 1.5 cm in maximum diameter. Left-sided axillary adenopathy was present which was suspicious for nodal disease. No concerning findings on the right.   PREVIOUS RADIATION THERAPY: No   PAST MEDICAL HISTORY:  has  a past medical history of Palpitations (2009); Anxiety disorder; Nonspecific abnormal electrocardiogram (ECG) (EKG) (2009); GERD (gastroesophageal reflux disease); Arthritis; and Hot flashes.     PAST SURGICAL HISTORY: Past Surgical History  Procedure Laterality Date  . Thyroidectomy, partial    . Cesarean section    . Endometrial ablation    . Dilation and curettage of uterus    . Laparoscopic assisted vaginal hysterectomy  02/07/2011    Procedure: LAPAROSCOPIC ASSISTED VAGINAL HYSTERECTOMY;  Surgeon: [name];  Location: [facility];  Service: Gynecology;  Laterality: N/A;  . Abdominal hysterectomy  02/07/2011    Procedure: HYSTERECTOMY ABDOMINAL;  Surgeon: [name];  Location: [facility];  Service: Gynecology;  Laterality: N/A;     FAMILY HISTORY: family history includes Angina in her father; Aortic aneurysm in her father; Arrhythmia in her maternal aunt, maternal aunt, and other; Breast cancer in her maternal aunt, maternal aunt, and mother; Colon cancer in her maternal grandfather and maternal grandmother; Dementia in her father; Hypertension in her other.   SOCIAL HISTORY:  reports that she has never smoked. She does not have any smokeless tobacco history on file. She reports that she drinks alcohol. She reports that she does not use illicit drugs.   ALLERGIES: Review of patient's allergies indicates no known allergies.   MEDICATIONS:  Current Outpatient Prescriptions  Medication Sig Dispense Refill  . calcium carbonate-magnesium hydroxide (ROLAIDS) 334 MG CHEW Chew 1 tablet by mouth 2 (two) times daily as needed. For indigestion       . citalopram (CELEXA) 20 MG tablet Take 20 mg by mouth daily. For mood disorder.       Marland Kitchen OMEPRAZOLE PO Take 1 capsule  by mouth daily as needed. For heartburn/reflux       . propranolol (INDERAL) 40 MG tablet Take 40 mg by mouth 2 (two) times daily.         No current facility-administered medications for this encounter.     REVIEW OF  SYSTEMS:  A 15 point review of systems is documented in the electronic medical record. This was obtained by the nursing staff. However, I reviewed this with the patient to discuss relevant findings and make appropriate changes.  Pertinent items are noted in HPI.    PHYSICAL EXAM:  vitals were not taken for this visit.  ECOG = 0  0 - Asymptomatic (Fully active, able to carry on all predisease activities without restriction)  1 - Symptomatic but completely ambulatory (Restricted in physically strenuous activity but ambulatory and able to carry out work of a light or sedentary nature. For example, light housework, office work)  2 - Symptomatic, <50% in bed during the day (Ambulatory and capable of all self care but unable to carry out any work activities. Up and about more than 50% of waking hours)  3 - Symptomatic, >50% in bed, but not bedbound (Capable of only limited self-care, confined to bed or chair 50% or more of waking hours)  4 - Bedbound (Completely disabled. Cannot carry on any self-care. Totally confined to bed or chair)  5 - Death   Eustace Pen MM, Creech RH, Tormey DC, et al. [phone]). "Toxicity and response criteria of the [facility]". [place] Oncol. 5 (6): 649-55  General: Well-developed, in no acute distress HEENT: Normocephalic, atraumatic; oral cavity clear Neck: Supple without any lymphadenopathy Cardiovascular: Regular rate and rhythm Respiratory: Clear to auscultation bilaterally Breasts:  I do not feel any discrete mass within the left breast. Small nodule is present within the lower left axilla. Negative exam on the right. GI: Soft, nontender, normal bowel sounds Extremities: No edema present Neuro: No focal deficits     LABORATORY DATA:  Lab Results  Component Value Date   WBC 6.6 08/20/2013   HGB 13.3 08/20/2013   HCT 39.1 08/20/2013   MCV 93.5 08/20/2013   PLT 274 08/20/2013   Lab Results  Component Value Date   NA 138 08/20/2013   K  3.5 08/20/2013   CO2 24 08/20/2013   Lab Results  Component Value Date   ALT 18 08/20/2013   AST 15 08/20/2013   ALKPHOS 64 08/20/2013   BILITOT 0.49 08/20/2013      RADIOGRAPHY: Mr Breast Bilateral W Wo Contrast  08/15/2013   CLINICAL DATA:  53 year old female with new diagnosis of 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  LABS:  None current  EXAM: BILATERAL BREAST MRI WITH AND WITHOUT CONTRAST  TECHNIQUE: Multiplanar, multisequence MR images of both breasts were obtained prior to and following the intravenous administration of 31m of MultiHance.  THREE-DIMENSIONAL MR IMAGE RENDERING ON INDEPENDENT WORKSTATION:  Three-dimensional MR images were rendered by post-processing of the original MR data on an independent workstation. The three-dimensional MR images were interpreted, and findings are reported in the following complete MRI report for this study. Three dimensional images were evaluated at the independent DynaCad workstation  COMPARISON:  08/01/2013 diagnostic mammogram and ultrasound.  FINDINGS: Breast composition: c:  Heterogeneous fibroglandular tissue  Background parenchymal enhancement: Marked background parenchymal enhancement.  Right breast: No abnormal mass or definite abnormal enhancement.  Left breast: An irregular enhancing mass with spiculated margins is identified in the far  medial and posterior left breast with associated metallic clip artifact consistent with biopsy-proven malignancy. Exact dimensions are difficult to ascertain given the extensive background parenchymal enhancement, though this mass measures approximately 1.5 x 1.5 x 1.5 cm. No abnormal enhancement of the pectoralis muscles identified.  Lymph nodes: The right axillary and right internal mammary lymph node chains are unremarkable. There is a single left internal mammary lymph node identified at the level of the biopsy-proven malignancy, though this is nonpathologic in appearance. There are at least 2  left axillary lymph nodes that demonstrate focal cortical thickening or loss of reniform shape. This may be secondary to inflammation related to recent left breast biopsy, though nodal disease cannot be completely excluded.  Ancillary findings:  None.  IMPRESSION: 1. Biopsy-proven malignancy in the far medial left breast measures approximately 1.5 cm in maximal diameter.  2. Left axillary adenopathy is suspicious for nodal disease versus reactive inflammation.  3.  No MR findings of malignancy in the right breast.  RECOMMENDATION: 1. Recommend surgical consultation for biopsy proven malignancy in the medial left breast. Patient is scheduled for multidisciplinary Clinic on 08/20/2013.  2. Left axillary adenopathy is suspicious for nodal disease versus reactive inflammation. If indicated, second-look left axillary ultrasound and possible biopsy may be performed.  BI-RADS CATEGORY  4: Suspicious.   Electronically Signed   By: Andres Shad   On: 08/15/2013 14:31   Mm Digital Diagnostic Bilat  08/01/2013   CLINICAL DATA:  Patient with a left axillary lump.  EXAM: DIGITAL DIAGNOSTIC  BILATERAL MAMMOGRAM WITH CAD  ULTRASOUND LEFT BREAST  COMPARISON:  08/24/2011  ACR Breast Density Category b: There are scattered areas of fibroglandular density.  FINDINGS: A 5 mm asymmetry is seen in the inner middle third of the left breast. This persists with spot compression imaging. In the right breast, there are loosely grouped faint punctate calcifications spanning up to 7 mm. No linear or branching forms. No associated mass or architectural distortion.  Mammographic images were processed with CAD.  On physical exam, no palpable abnormality is identified in the area of mammographic concern in the left breast or in the left axilla.  Targeted ultrasound of the left breast demonstrates an ill-defined area of shadowing at 9 o'clock, 4 cm from the nipple. This spans up to 1.3 cm. Additionally, there is a circumscribed oval hypoechoic  mass at 10 o'clock, 4 cm from the nipple. This measures 4 x 4 x 3 mm. No internal vascularity is identified. Examination of the left axilla demonstrate 2 lymph nodes with top normal cortical thickness (3 mm). Fatty hila are maintained. These lymph nodes do not correlate with the patient's area of palpable concern.  IMPRESSION: 1. Ill-defined area of shadowing at 9 o'clock in the left breast. Top-normal cortical thickness of 2 left axillary lymph nodes. 2. Probably benign left breast mass at 10 o'clock, measuring up to 4 mm. 3. Probably benign right breast calcifications.  RECOMMENDATION: Ultrasound-guided biopsy of the area of ill defined shadowing in the left breast is recommended. This has been scheduled for 08/07/2013. Given that the left axillary lymph nodes are top normal in cortical thickness, biopsy is not recommended unless pathology results from the left breast biopsy demonstrate malignancy. If biopsy results are benign, short-term follow-up of the probably benign findings listed above is recommended.  I have discussed the findings and recommendations with the patient. Results were also provided in writing at the conclusion of the visit. If applicable, a reminder letter will be sent to  the patient regarding the next appointment.  BI-RADS CATEGORY  4: Suspicious abnormality - biopsy should be considered.   Electronically Signed   By: Donavan Burnet M.D.   On: 08/01/2013 17:55   Mm Digital Diagnostic Unilat L  08/07/2013   CLINICAL DATA:  Left breast mass  EXAM: POST-BIOPSY CLIP PLACEMENT LEFT DIAGNOSTIC MAMMOGRAM  COMPARISON:  Previous exams.  FINDINGS: Films are performed following ultrasound guided biopsy of a left breast mass. Images show a ribbon type clip in association with the mass in the 9 o'clock position of the left breast, posteriorly.  IMPRESSION: Adequate clip placement following ultrasound-guided left breast biopsy.  Final Assessment: Post Procedure Mammograms for Marker Placement    Electronically Signed   By: Duke Salvia M.D.   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 This is concordant with the imaging findings. Susa Raring, RN, called results to the [facility] as the patient had an appointment in the [facility] today, 08/20/2013.   Electronically Signed   By: Ulyess Blossom M.D.   On: 08/20/2013 14:58   08/20/2013   CLINICAL DATA:  Abnormal lymph node seen on MRI.  EXAM: ULTRASOUND OF THE LEFT AXILLA  COMPARISON:  Multiple priors  FINDINGS: On physical exam,I palpate normal tissue in the left axilla.  Ultrasound is performed, showing a lymph node with a diffusely thickened cortex in the area of MRI concern.  IMPRESSION: Diffuse cortical thickening, left axillary lymph node.  RECOMMENDATION: A biopsy is done and reported separately.  I have discussed the findings and recommendations with the patient. Results were also provided in writing at the conclusion of the visit. If applicable, a reminder letter will be sent to the patient regarding the next appointment.  BI-RADS CATEGORY  4: Suspicious.  Electronically Signed: By: Duke Salvia M.D. On: 08/19/2013 11:04   US Breast Ltd Uni Left Inc Axilla  08/01/2013   CLINICAL DATA:  Patient with a left axillary lump.  EXAM: DIGITAL DIAGNOSTIC  BILATERAL MAMMOGRAM WITH CAD  ULTRASOUND LEFT BREAST  COMPARISON:  08/24/2011  ACR Breast Density Category b: There are scattered areas of fibroglandular density.  FINDINGS: A 5 mm asymmetry is seen in the inner middle third of the left breast. This persists with spot compression imaging. In the right breast, there are loosely grouped faint punctate calcifications spanning up to 7 mm. No linear or branching forms. No associated mass or architectural distortion.  Mammographic images were processed with CAD.  On  physical exam, no palpable abnormality is identified in the area of mammographic concern in the left breast or in the left axilla.  Targeted ultrasound of the left breast demonstrates an ill-defined area of shadowing at 9 o'clock, 4 cm from the nipple. This spans up to 1.3 cm. Additionally, there is a circumscribed oval hypoechoic mass at 10 o'clock, 4 cm from the nipple. This measures 4 x 4 x 3 mm. No internal vascularity is identified. Examination of the left axilla demonstrate 2 lymph nodes with top normal cortical thickness (3 mm). Fatty hila are maintained. These lymph nodes do not correlate with the patient's area of palpable concern.  IMPRESSION: 1. Ill-defined area of shadowing at 9 o'clock in the left breast. Top-normal cortical thickness of 2 left axillary lymph nodes. 2. Probably benign left breast mass at 10 o'clock, measuring up to 4 mm. 3.  Probably benign right breast calcifications.  RECOMMENDATION: Ultrasound-guided biopsy of the area of ill defined shadowing in the left breast is recommended. This has been scheduled for 08/07/2013. Given that the left axillary lymph nodes are top normal in cortical thickness, biopsy is not recommended unless pathology results from the left breast biopsy demonstrate malignancy. If biopsy results are benign, short-term follow-up of the probably benign findings listed above is recommended.  I have discussed the findings and recommendations with the patient. Results were also provided in writing at the conclusion of the visit. If applicable, a reminder letter will be sent to the patient regarding the next appointment.  BI-RADS CATEGORY  4: Suspicious abnormality - biopsy should be considered.   Electronically Signed   By: Donavan Burnet M.D.   On: 08/01/2013 17:55   [place] Lt Breast Bx W Loc Dev 1st Lesion Img Bx Spec US Guide  08/19/2013   CLINICAL DATA:  Questionable left axillary lymph node. Known left-sided breast cancer  EXAM: ULTRASOUND GUIDED LEFT BREAST CORE  NEEDLE BIOPSY  COMPARISON:  Previous exams.  FINDINGS: I met with the patient and we discussed the procedure of ultrasound-guided biopsy, including benefits and alternatives. We discussed the high likelihood of a successful procedure. We discussed the risks of the procedure, including infection, bleeding, tissue injury, and inadequate sampling. Informed written consent was given. The usual time-out protocol was performed immediately prior to the procedure.  Using sterile technique and 2% Lidocaine as local anesthetic, under direct ultrasound visualization, a 14 gauge spring-loaded device was used to perform biopsy of the lymph node using a lateral approach.  IMPRESSION: Ultrasound guided biopsy of a left axillary lymph node. No apparent complications.   Electronically Signed   By: Duke Salvia M.D.   On: 08/19/2013 11:05   [place] Lt Breast Bx W Loc Dev 1st Lesion Img Bx Spec US Guide  08/08/2013   ADDENDUM REPORT: 08/08/2013 13:27  ADDENDUM: Biopsy results revealed invasive ductal carcinoma and carcinoma in situ. This is concordant with the imaging findings. This is discussed with patient by phone on 08/08/2013 at 1:25 p.m. An MRI scheduled on 08/15/2013 at 11:45 a.m. The patient is scheduled for multidisciplinary Clinic on 08/20/2013. She states that biopsy site is healing well without significant pain or bruising.   Electronically Signed   By: Duke Salvia M.D.   On: 08/08/2013 13:27   08/08/2013   CLINICAL DATA:  Left breast mass  EXAM: ULTRASOUND GUIDED LEFT BREAST CORE NEEDLE BIOPSY  COMPARISON:  Previous exams.  FINDINGS: I met with the patient and we discussed the procedure of ultrasound-guided biopsy, including benefits and alternatives. We discussed the high likelihood of a successful procedure. We discussed the risks of the procedure, including infection, bleeding, tissue injury, clip migration, and inadequate sampling. Informed written consent was given. The usual time-out protocol was performed  immediately prior to the procedure.  Using sterile technique and 2% Lidocaine as local anesthetic, under direct ultrasound visualization, a 14 gauge spring-loaded device was used to perform biopsy of the mass using a medial approach. At the conclusion of the procedure a tissue marker clip was deployed into the biopsy cavity. Follow up 2 view mammogram was performed and dictated separately.  IMPRESSION: Ultrasound guided biopsy of a left breast mass. No apparent complications.  Electronically Signed: By: Duke Salvia M.D. On: 08/07/2013 16:23       IMPRESSION: The patient has a new diagnosis of invasive lobular carcinoma of the left breast. At this time she has  a diagnosis of T1 N1 M0 invasive mammary carcinoma. The patient had her case discussed in breast conference this morning and she is seen today in multidisciplinary breast clinic. The patient has discussed possible surgical options in clinic today. Options related to breast conservation treatment versus mastectomy were discussed with her by the surgeon. The patient also has a strong family history of breast cancer and has been referred for genetic testing. This may help to guide her decision making process. With regards to systemic treatment, an Oncotype test has been ordered.  I discussed with the patient a potential role for radiation treatment in this setting. I discussed with her the standard role for radiation treatment to improve local and regional control in the setting of breast conservation treatment. We also discussed the potential benefit of postmastectomy radiation treatment as well. I would recommend this and the patient's case given her age and positive nodal status at this time. We discussed the possible side effects and risks of treatment. All of her questions were answered.   PLAN: The patient is going to proceed with genetic testing and further testing in terms of Oncotype. She will followup initially with medical oncology and  surgery to make final decisions. I look forward to seeing the patient postoperatively to review her status at that time and to coordinate an anticipated course of adjuvant radiation treatment.      ________________________________   Jodelle Gross, MD, PhD

## 2013-08-28 ENCOUNTER — Encounter: Payer: Self-pay | Admitting: Genetic Counselor

## 2013-08-28 ENCOUNTER — Other Ambulatory Visit: Payer: BC Managed Care – PPO

## 2013-08-28 ENCOUNTER — Telehealth (INDEPENDENT_AMBULATORY_CARE_PROVIDER_SITE_OTHER): Payer: Self-pay

## 2013-08-28 ENCOUNTER — Ambulatory Visit (HOSPITAL_BASED_OUTPATIENT_CLINIC_OR_DEPARTMENT_OTHER): Payer: BC Managed Care – PPO | Admitting: Genetic Counselor

## 2013-08-28 DIAGNOSIS — Z803 Family history of malignant neoplasm of breast: Secondary | ICD-10-CM | POA: Insufficient documentation

## 2013-08-28 DIAGNOSIS — IMO0002 Reserved for concepts with insufficient information to code with codable children: Secondary | ICD-10-CM

## 2013-08-28 DIAGNOSIS — C50919 Malignant neoplasm of unspecified site of unspecified female breast: Secondary | ICD-10-CM

## 2013-08-28 NOTE — Progress Notes (Signed)
Patient Name: [name] [name] Patient Age: 53 y.o. Encounter Date: 08/28/2013  Referring Physician: [name], MD [place], [place] [address]  Primary Care Provider: [name]., MD   [name] [name], a 52 y.o. female, is being seen at the [facility] due to a personal and family history of breast cancer.  She presents to clinic today to discuss the possibility of a hereditary predisposition to cancer and discuss whether genetic testing is warranted.  HISTORY OF PRESENT ILLNESS: [name] [name] was recently diagnosed with left breast cancer (invasive lobular carcinoma) at the age of 29. The breast tumor is ER/PR positive and HER2 negative. Surgical decisions are not yet finalized.   Past Medical History  Diagnosis Date  . Palpitations 2009    Improved; Secondary to premature ventricular contractions. No problems since-pt states was caring for ailing father, anxiety  . Anxiety disorder     Mild: treated by her gynecologist with selective serotonin reuptake inhibitor  . Nonspecific abnormal electrocardiogram (ECG) (EKG) 2009  . GERD (gastroesophageal reflux disease)     rolaids  . Arthritis   . Hot flashes   . Malignant neoplasm of breast (female), unspecified site   . Family history of malignant neoplasm of breast     Past Surgical History  Procedure Laterality Date  . Thyroidectomy, partial    . Cesarean section    . Endometrial ablation    . Dilation and curettage of uterus    . Laparoscopic assisted vaginal hysterectomy  02/07/2011    Procedure: LAPAROSCOPIC ASSISTED VAGINAL HYSTERECTOMY;  Surgeon: [name];  Location: [facility];  Service: Gynecology;  Laterality: N/A;  . Abdominal hysterectomy  02/07/2011    Procedure: HYSTERECTOMY ABDOMINAL;  Surgeon: [name];  Location: [facility];  Service: Gynecology;  Laterality: N/A;    History   Social History  . Marital Status: Married    Spouse Name: N/A    Number of Children: N/A  . Years of Education:  N/A   Occupational History  . [employer] of deeds     Full time   Social History Main Topics  . Smoking status: Never Smoker   . Smokeless tobacco: Not on file  . Alcohol Use: Yes     Comment: Occasionally  . Drug Use: No  . Sexual Activity: Not on file   Other Topics Concern  . Not on file   Social History Narrative  . No narrative on file     FAMILY HISTORY:   During the visit, a 4-generation pedigree was obtained. Significant diagnoses include the following:  Family History  Problem Relation Age of Onset  . Angina Father   . Aortic aneurysm Father   . Dementia Father   . Arrhythmia Maternal Aunt     A fib  . Breast cancer Maternal Aunt 56    currently 37  . Hypertension Other   . Arrhythmia Other     Uncle- a fib  . Arrhythmia Maternal Aunt     A fib  . Breast cancer Maternal Aunt     dx 82s; currently 50s  . Breast cancer Mother     dx 36s. Died at 41  . Colon cancer Maternal Grandmother     dx 28s; died at 15  . Colon cancer Maternal Grandfather     dx 49s; died in 76s  . Cancer Paternal Uncle     unknown primary in early 24s  . Breast cancer Maternal Aunt     dx 15s;  died in 69s    Additionally, it is important to note that her father had no sisters.  [name] [name] ancestry is English. There is no known Jewish ancestry and no consanguinity.  ASSESSMENT AND PLAN: [name] [name] is a 53 y.o. female with a personal and family history of breast cancer. This history is suggestive of a hereditary predisposition to cancer even though the ages at diagnosis were not particularly young. Of her mother's generation, there are 4 out of 5 women with breast cancer. We reviewed the characteristics, features and inheritance patterns of hereditary cancer syndromes. We also discussed genetic testing, including the process of testing, insurance coverage and implications of results.   [name] [name] wished to pursue genetic testing and a blood sample will be sent to [facility] for analysis of the 5 genes on the BRCAplus panel (BRCA1, BRCA2, CDH1, PTEN, and p53). If no mutations are detected, we have asked the lab to reflex to BreastNext, which includes 12 other moderately penetrant genes. Results from [name] should be available in two weeks or less. We asked for a STAT turn-around time due to [name] [name] wanting to make surgical decisions. We discussed the implications of a positive, negative and/ or Variant of Uncertain Significance (VUS) result. Once we have results of each test, we will contact her and address implications for her as well as address genetic testing for at-risk family members, if needed.    We encouraged [name] to remain in contact with Cancer Genetics annually so that we can update the family history and inform her of any changes in cancer genetics and testing that may be of benefit for this family. [name] questions were answered to her satisfaction today.   Thank you for the referral and allowing [place] to share in the care of your patient.   The patient was seen for a total of 35 minutes, greater than 50% of which was spent face-to-face counseling. This patient was discussed with the referring provider who agrees with the above.

## 2013-08-28 NOTE — Telephone Encounter (Signed)
Called and spoke to patient to give pre-op appointment for 09/05/13 @ 8:45 am w/Dr. Excell Seltzer.

## 2013-09-01 ENCOUNTER — Telehealth (INDEPENDENT_AMBULATORY_CARE_PROVIDER_SITE_OTHER): Payer: Self-pay | Admitting: General Surgery

## 2013-09-01 NOTE — Telephone Encounter (Signed)
Tried to call pt re question, no answer

## 2013-09-03 ENCOUNTER — Other Ambulatory Visit (INDEPENDENT_AMBULATORY_CARE_PROVIDER_SITE_OTHER): Payer: Self-pay | Admitting: General Surgery

## 2013-09-03 DIAGNOSIS — C50919 Malignant neoplasm of unspecified site of unspecified female breast: Secondary | ICD-10-CM

## 2013-09-05 ENCOUNTER — Encounter (HOSPITAL_BASED_OUTPATIENT_CLINIC_OR_DEPARTMENT_OTHER): Payer: Self-pay | Admitting: *Deleted

## 2013-09-05 ENCOUNTER — Other Ambulatory Visit: Payer: BC Managed Care – PPO

## 2013-09-05 ENCOUNTER — Encounter (INDEPENDENT_AMBULATORY_CARE_PROVIDER_SITE_OTHER): Payer: Self-pay | Admitting: General Surgery

## 2013-09-05 ENCOUNTER — Ambulatory Visit (INDEPENDENT_AMBULATORY_CARE_PROVIDER_SITE_OTHER): Payer: BC Managed Care – PPO | Admitting: General Surgery

## 2013-09-05 VITALS — BP 118/72 | HR 66 | Temp 98.3°F | Resp 16 | Ht 67.0 in | Wt 152.8 lb

## 2013-09-05 DIAGNOSIS — C50212 Malignant neoplasm of upper-inner quadrant of left female breast: Secondary | ICD-10-CM

## 2013-09-05 DIAGNOSIS — C50219 Malignant neoplasm of upper-inner quadrant of unspecified female breast: Secondary | ICD-10-CM

## 2013-09-05 NOTE — Progress Notes (Signed)
Chief complaint: Followup left breast cancer  History: Patient returns for followup of her clinical stage IIA(T1 N1) invasive lobular cancer of the left breast. She has gone through genetic testing but the results are pending. She had questions regarding surgical treatment and we brought her in to discuss these. She was still questioning whether she would want bilateral mastectomy based on genetic testing. We went over the pros and cons of this in detail. She understands there is no survival benefit bilateral mastectomy. We discussed the possible various risks of genetic mutations. After our discussion she feels that she was to go ahead with treatment of her primary cancer regardless of genetic testing, with breast conservation. We discussed the procedure again in detail of needle localized lumpectomy and axillary dissection. We discussed risks of lymphedema and bleeding and infection in possible need for further surgery of margins were positive. All her questions were answered  Exam: Breasts: Again noted is a probable but very subtle medial left breast mass. I cannot feel any axillary adenopathy.  Assessment and plan: Stage II a invasive lobular cancer of the left breast. We plan to proceed with needle localized lumpectomy and axillary dissection week. Adjuvant treatment will depend on the final pathologic stage and Oncotype. All of her and her husband's questions were answered.

## 2013-09-08 ENCOUNTER — Encounter: Payer: Self-pay | Admitting: *Deleted

## 2013-09-09 ENCOUNTER — Ambulatory Visit
Admission: RE | Admit: 2013-09-09 | Discharge: 2013-09-09 | Disposition: A | Discharge status: Home / Self Care | Payer: BC Managed Care – PPO | Source: Ambulatory Visit | Attending: General Surgery | Admitting: General Surgery

## 2013-09-09 ENCOUNTER — Ambulatory Visit (HOSPITAL_BASED_OUTPATIENT_CLINIC_OR_DEPARTMENT_OTHER)
Admission: RE | Admit: 2013-09-09 | Discharge: 2013-09-10 | Disposition: A | Discharge status: Home / Self Care | Payer: BC Managed Care – PPO | Source: Ambulatory Visit | Attending: General Surgery | Admitting: General Surgery

## 2013-09-09 ENCOUNTER — Ambulatory Visit (HOSPITAL_BASED_OUTPATIENT_CLINIC_OR_DEPARTMENT_OTHER): Payer: BC Managed Care – PPO | Admitting: Anesthesiology

## 2013-09-09 ENCOUNTER — Other Ambulatory Visit (INDEPENDENT_AMBULATORY_CARE_PROVIDER_SITE_OTHER): Payer: Self-pay | Admitting: General Surgery

## 2013-09-09 ENCOUNTER — Encounter (HOSPITAL_BASED_OUTPATIENT_CLINIC_OR_DEPARTMENT_OTHER): Payer: Self-pay | Admitting: *Deleted

## 2013-09-09 ENCOUNTER — Encounter (HOSPITAL_BASED_OUTPATIENT_CLINIC_OR_DEPARTMENT_OTHER): Payer: BC Managed Care – PPO | Admitting: Anesthesiology

## 2013-09-09 ENCOUNTER — Encounter (HOSPITAL_BASED_OUTPATIENT_CLINIC_OR_DEPARTMENT_OTHER)
Admission: RE | Disposition: A | Discharge status: Home / Self Care | Payer: Self-pay | Source: Ambulatory Visit | Attending: General Surgery

## 2013-09-09 DIAGNOSIS — C50919 Malignant neoplasm of unspecified site of unspecified female breast: Secondary | ICD-10-CM

## 2013-09-09 DIAGNOSIS — R928 Other abnormal and inconclusive findings on diagnostic imaging of breast: Secondary | ICD-10-CM

## 2013-09-09 DIAGNOSIS — C773 Secondary and unspecified malignant neoplasm of axilla and upper limb lymph nodes: Secondary | ICD-10-CM | POA: Insufficient documentation

## 2013-09-09 DIAGNOSIS — Z79899 Other long term (current) drug therapy: Secondary | ICD-10-CM | POA: Insufficient documentation

## 2013-09-09 DIAGNOSIS — C50912 Malignant neoplasm of unspecified site of left female breast: Secondary | ICD-10-CM

## 2013-09-09 DIAGNOSIS — K219 Gastro-esophageal reflux disease without esophagitis: Secondary | ICD-10-CM | POA: Insufficient documentation

## 2013-09-09 DIAGNOSIS — Z170 Estrogen receptor positive status [ER+]: Secondary | ICD-10-CM | POA: Insufficient documentation

## 2013-09-09 HISTORY — PX: BREAST LUMPECTOMY: SHX2

## 2013-09-09 HISTORY — DX: Malignant neoplasm of unspecified site of unspecified female breast: C50.919

## 2013-09-09 HISTORY — PX: BREAST LUMPECTOMY WITH NEEDLE LOCALIZATION AND AXILLARY LYMPH NODE DISSECTION: SHX5758

## 2013-09-09 SURGERY — BREAST LUMPECTOMY WITH NEEDLE LOCALIZATION AND AXILLARY LYMPH NODE DISSECTION
Anesthesia: General | Site: Breast | Laterality: Left

## 2013-09-09 MED ORDER — ONDANSETRON HCL 4 MG/2ML IJ SOLN
INTRAMUSCULAR | Status: DC | PRN
  Administered 2013-09-09: 4 mg via INTRAVENOUS

## 2013-09-09 MED ORDER — LACTATED RINGERS IV SOLN
INTRAVENOUS | Status: DC
  Administered 2013-09-09 (×2): via INTRAVENOUS

## 2013-09-09 MED ORDER — CHLORHEXIDINE GLUCONATE 4 % EX LIQD: 1.0000 "application " | Freq: Once | CUTANEOUS | Status: DC

## 2013-09-09 MED ORDER — DEXAMETHASONE SODIUM PHOSPHATE 4 MG/ML IJ SOLN
INTRAMUSCULAR | Status: DC | PRN
  Administered 2013-09-09: 10 mg via INTRAVENOUS

## 2013-09-09 MED ORDER — BUPIVACAINE-EPINEPHRINE (PF) 0.5% -1:200000 IJ SOLN
INTRAMUSCULAR | Status: AC
  Filled 2013-09-09: qty 30

## 2013-09-09 MED ORDER — MIDAZOLAM HCL 5 MG/5ML IJ SOLN
INTRAMUSCULAR | Status: DC | PRN
  Administered 2013-09-09: 2 mg via INTRAVENOUS

## 2013-09-09 MED ORDER — MIDAZOLAM HCL 2 MG/2ML IJ SOLN
INTRAMUSCULAR | Status: AC
  Filled 2013-09-09: qty 2

## 2013-09-09 MED ORDER — FENTANYL CITRATE 0.05 MG/ML IJ SOLN
INTRAMUSCULAR | Status: AC
  Filled 2013-09-09: qty 6

## 2013-09-09 MED ORDER — FENTANYL CITRATE 0.05 MG/ML IJ SOLN
INTRAMUSCULAR | Status: AC
  Filled 2013-09-09: qty 2

## 2013-09-09 MED ORDER — HYDROMORPHONE HCL PF 1 MG/ML IJ SOLN
INTRAMUSCULAR | Status: AC
  Filled 2013-09-09: qty 1

## 2013-09-09 MED ORDER — FENTANYL CITRATE 0.05 MG/ML IJ SOLN
INTRAMUSCULAR | Status: DC | PRN
  Administered 2013-09-09 (×2): 50 ug via INTRAVENOUS

## 2013-09-09 MED ORDER — SUCCINYLCHOLINE CHLORIDE 20 MG/ML IJ SOLN
INTRAMUSCULAR | Status: AC
  Filled 2013-09-09: qty 1

## 2013-09-09 MED ORDER — SCOPOLAMINE 1 MG/3DAYS TD PT72
1.0000 | MEDICATED_PATCH | TRANSDERMAL | Status: DC
  Administered 2013-09-09: 1.5 mg via TRANSDERMAL

## 2013-09-09 MED ORDER — HEPARIN SODIUM (PORCINE) 5000 UNIT/ML IJ SOLN
5000.0000 [IU] | Freq: Three times a day (TID) | INTRAMUSCULAR | Status: DC
  Administered 2013-09-09 – 2013-09-10 (×2): 5000 [IU] via SUBCUTANEOUS

## 2013-09-09 MED ORDER — HEPARIN SODIUM (PORCINE) 5000 UNIT/ML IJ SOLN
INTRAMUSCULAR | Status: AC
  Filled 2013-09-09: qty 1

## 2013-09-09 MED ORDER — OXYCODONE-ACETAMINOPHEN 5-325 MG PO TABS
1.0000 | ORAL_TABLET | ORAL | Status: DC | PRN
  Administered 2013-09-09 (×2): 1 via ORAL
  Filled 2013-09-09 (×2): qty 1

## 2013-09-09 MED ORDER — PROPOFOL 10 MG/ML IV BOLUS
INTRAVENOUS | Status: DC | PRN
  Administered 2013-09-09: 200 mg via INTRAVENOUS

## 2013-09-09 MED ORDER — CEFAZOLIN SODIUM-DEXTROSE 2-3 GM-% IV SOLR
2.0000 g | INTRAVENOUS | Status: AC
  Administered 2013-09-09: 2 g via INTRAVENOUS

## 2013-09-09 MED ORDER — SCOPOLAMINE 1 MG/3DAYS TD PT72: 1.0000 | MEDICATED_PATCH | TRANSDERMAL | Status: DC

## 2013-09-09 MED ORDER — MORPHINE SULFATE 2 MG/ML IJ SOLN: 2.0000 mg | INTRAMUSCULAR | Status: DC | PRN

## 2013-09-09 MED ORDER — LIDOCAINE HCL (CARDIAC) 20 MG/ML IV SOLN
INTRAVENOUS | Status: DC | PRN
Start: 2013-09-09 — End: 2013-09-09
  Administered 2013-09-09: 50 mg via INTRAVENOUS

## 2013-09-09 MED ORDER — BUPIVACAINE-EPINEPHRINE (PF) 0.5% -1:200000 IJ SOLN
INTRAMUSCULAR | Status: DC | PRN
  Administered 2013-09-09: 20 mL

## 2013-09-09 MED ORDER — OXYCODONE HCL 5 MG PO TABS: 5.0000 mg | ORAL_TABLET | Freq: Once | ORAL | Status: AC | PRN

## 2013-09-09 MED ORDER — BUPIVACAINE-EPINEPHRINE (PF) 0.25% -1:200000 IJ SOLN
INTRAMUSCULAR | Status: AC
  Filled 2013-09-09: qty 30

## 2013-09-09 MED ORDER — HEPARIN SODIUM (PORCINE) 5000 UNIT/ML IJ SOLN: 5000.0000 [IU] | Freq: Three times a day (TID) | INTRAMUSCULAR | Status: DC

## 2013-09-09 MED ORDER — FENTANYL CITRATE 0.05 MG/ML IJ SOLN
50.0000 ug | INTRAMUSCULAR | Status: DC | PRN
  Administered 2013-09-09: 100 ug via INTRAVENOUS

## 2013-09-09 MED ORDER — ONDANSETRON HCL 4 MG PO TABS: 4.0000 mg | ORAL_TABLET | Freq: Four times a day (QID) | ORAL | Status: DC | PRN

## 2013-09-09 MED ORDER — ONDANSETRON HCL 4 MG/2ML IJ SOLN: 4.0000 mg | Freq: Four times a day (QID) | INTRAMUSCULAR | Status: DC | PRN

## 2013-09-09 MED ORDER — LACTATED RINGERS IV SOLN
INTRAVENOUS | Status: DC
  Administered 2013-09-09: 13:00:00 via INTRAVENOUS

## 2013-09-09 MED ORDER — OXYCODONE HCL 5 MG/5ML PO SOLN: 5.0000 mg | Freq: Once | ORAL | Status: AC | PRN

## 2013-09-09 MED ORDER — HYDROMORPHONE HCL PF 1 MG/ML IJ SOLN
0.2500 mg | INTRAMUSCULAR | Status: DC | PRN
  Administered 2013-09-09 (×3): 0.5 mg via INTRAVENOUS

## 2013-09-09 MED ORDER — PROPOFOL 10 MG/ML IV BOLUS
INTRAVENOUS | Status: AC
  Filled 2013-09-09: qty 20

## 2013-09-09 MED ORDER — SCOPOLAMINE 1 MG/3DAYS TD PT72
MEDICATED_PATCH | TRANSDERMAL | Status: AC
  Filled 2013-09-09: qty 1

## 2013-09-09 MED ORDER — OXYCODONE-ACETAMINOPHEN 5-325 MG PO TABS: 1.0000 | ORAL_TABLET | ORAL | Status: DC | PRN

## 2013-09-09 MED ORDER — MIDAZOLAM HCL 2 MG/2ML IJ SOLN
1.0000 mg | INTRAMUSCULAR | Status: DC | PRN
  Administered 2013-09-09: 2 mg via INTRAVENOUS

## 2013-09-09 SURGICAL SUPPLY — 53 items
APPLIER CLIP 9.375 MED OPEN (MISCELLANEOUS) ×9
BINDER BREAST LRG (GAUZE/BANDAGES/DRESSINGS) ×3 IMPLANT
BLADE 15 SAFETY STRL DISP (BLADE) ×3 IMPLANT
BLADE SURG 15 STRL LF DISP TIS (BLADE) ×1 IMPLANT
BLADE SURG 15 STRL SS (BLADE) ×2
CANISTER SUCT 1200ML W/VALVE (MISCELLANEOUS) ×3 IMPLANT
CHLORAPREP W/TINT 26ML (MISCELLANEOUS) ×3 IMPLANT
CLIP APPLIE 9.375 MED OPEN (MISCELLANEOUS) ×3 IMPLANT
COVER MAYO STAND STRL (DRAPES) ×3 IMPLANT
COVER TABLE BACK 60X90 (DRAPES) ×3 IMPLANT
DECANTER SPIKE VIAL GLASS SM (MISCELLANEOUS) IMPLANT
DERMABOND ADVANCED (GAUZE/BANDAGES/DRESSINGS) ×4
DERMABOND ADVANCED .7 DNX12 (GAUZE/BANDAGES/DRESSINGS) ×2 IMPLANT
DEVICE DUBIN W/COMP PLATE 8390 (MISCELLANEOUS) ×3 IMPLANT
DRAIN CHANNEL 19F RND (DRAIN) ×3 IMPLANT
DRAIN HEMOVAC 1/8 X 5 (WOUND CARE) IMPLANT
DRAPE LAPAROSCOPIC ABDOMINAL (DRAPES) ×3 IMPLANT
DRAPE UTILITY XL STRL (DRAPES) ×3 IMPLANT
ELECT COATED BLADE 2.86 ST (ELECTRODE) ×3 IMPLANT
ELECT REM PT RETURN 9FT ADLT (ELECTROSURGICAL) ×3
ELECTRODE REM PT RTRN 9FT ADLT (ELECTROSURGICAL) ×1 IMPLANT
EVACUATOR SILICONE 100CC (DRAIN) ×3 IMPLANT
GLOVE BIO SURGEON STRL SZ7 (GLOVE) ×3 IMPLANT
GLOVE BIO SURGEON STRL SZ7.5 (GLOVE) ×3 IMPLANT
GLOVE BIOGEL PI IND STRL 7.5 (GLOVE) ×1 IMPLANT
GLOVE BIOGEL PI IND STRL 8 (GLOVE) ×2 IMPLANT
GLOVE BIOGEL PI INDICATOR 7.5 (GLOVE) ×2
GLOVE BIOGEL PI INDICATOR 8 (GLOVE) ×4
GLOVE SS BIOGEL STRL SZ 7.5 (GLOVE) ×2 IMPLANT
GLOVE SUPERSENSE BIOGEL SZ 7.5 (GLOVE) ×4
GOWN STRL REUS W/ TWL LRG LVL3 (GOWN DISPOSABLE) ×1 IMPLANT
GOWN STRL REUS W/ TWL XL LVL3 (GOWN DISPOSABLE) ×2 IMPLANT
GOWN STRL REUS W/TWL LRG LVL3 (GOWN DISPOSABLE) ×2
GOWN STRL REUS W/TWL XL LVL3 (GOWN DISPOSABLE) ×4
KIT MARKER MARGIN INK (KITS) ×3 IMPLANT
NEEDLE HYPO 25X1 1.5 SAFETY (NEEDLE) ×3 IMPLANT
PACK BASIN DAY SURGERY FS (CUSTOM PROCEDURE TRAY) ×3 IMPLANT
PENCIL BUTTON HOLSTER BLD 10FT (ELECTRODE) ×3 IMPLANT
PIN SAFETY STERILE (MISCELLANEOUS) ×3 IMPLANT
SLEEVE SCD COMPRESS KNEE MED (MISCELLANEOUS) ×3 IMPLANT
SPONGE LAP 4X18 X RAY DECT (DISPOSABLE) ×6 IMPLANT
STAPLER VISISTAT 35W (STAPLE) ×3 IMPLANT
SUT ETHILON 3 0 PS 1 (SUTURE) ×3 IMPLANT
SUT MNCRL AB 4-0 PS2 18 (SUTURE) ×6 IMPLANT
SUT VICRYL 3-0 CR8 SH (SUTURE) ×6 IMPLANT
SUT VICRYL AB 3 0 TIES (SUTURE) ×3 IMPLANT
SYR BULB 3OZ (MISCELLANEOUS) IMPLANT
SYR CONTROL 10ML LL (SYRINGE) ×3 IMPLANT
TOWEL OR 17X24 6PK STRL BLUE (TOWEL DISPOSABLE) ×6 IMPLANT
TOWEL OR NON WOVEN STRL DISP B (DISPOSABLE) ×3 IMPLANT
TUBE CONNECTING 20'X1/4 (TUBING) ×1
TUBE CONNECTING 20X1/4 (TUBING) ×2 IMPLANT
YANKAUER SUCT BULB TIP NO VENT (SUCTIONS) ×6 IMPLANT

## 2013-09-09 NOTE — Discharge Instructions (Signed)
Post Anesthesia Home Care Instructions  Activity: Get plenty of rest for the remainder of the day. A responsible adult should stay with you for 24 hours following the procedure.  For the next 24 hours, DO NOT: -Drive a car -Paediatric nurse -Drink alcoholic beverages -Take any medication unless instructed by your physician -Make any legal decisions or sign important papers.  Meals: Start with liquid foods such as gelatin or soup. Progress to regular foods as tolerated. Avoid greasy, spicy, heavy foods. If nausea and/or vomiting occur, drink only clear liquids until the nausea and/or vomiting subsides. Call your physician if vomiting continues.  Special Instructions/Symptoms: Your throat may feel dry or sore from the anesthesia or the breathing tube placed in your throat during surgery. If this causes discomfort, gargle with warm salt water. The discomfort should disappear within 24 hours.   [facility] [phone]  LUMPECTOMY: POST OP INSTRUCTIONS  Always review your discharge instruction sheet given to you by the facility where your surgery was performed. IF YOU HAVE DISABILITY OR FAMILY LEAVE FORMS, YOU MUST BRING THEM TO THE OFFICE FOR PROCESSING.   DO NOT GIVE THEM TO YOUR DOCTOR. A prescription for pain medication may be given to you upon discharge.  Take your pain medication as prescribed, if needed.  If narcotic pain medicine is not needed, then you may take acetaminophen (Tylenol) or ibuprofen (Advil) as needed. 1. Take your usually prescribed medications unless otherwise directed. 2. If you need a refill on your pain medication, please contact your pharmacy.  They will contact our office to request authorization.  Prescriptions will not be filled after 5pm or on week-ends. 3. You should follow a light diet the first few days after arrival home, such as soup and crackers, etc.  Resume your normal diet the day after surgery. 4. Most patients will  experience some swelling and bruising on the chest and underarm.  Ice packs will help.  Swelling and bruising can take several days to resolve.  5. It is common to experience some constipation if taking pain medication after surgery.  Increasing fluid intake and taking a stool softener (such as Colace) will usually help or prevent this problem from occurring.  A mild laxative (Milk of Magnesia or Miralax) should be taken according to package instructions if there are no bowel movements after 48 hours. 6. Unless discharge instructions indicate otherwise, leave your bandage dry and in place until your next appointment in 3-5 days.  You may take a limited sponge bath.  No tube baths or showers until the drains are removed.  You may have steri-strips (small skin tapes) in place directly over the incision.  These strips should be left on the skin for 7-10 days.  If your surgeon used skin glue on the incision, you may shower in 24 hours.  The glue will flake off over the next 2-3 weeks.  Any sutures or staples will be removed at the office during your follow-up visit. 7. DRAINS:  If you have drains in place, it is important to keep a list of the amount of drainage produced each day in your drains.  Before leaving the hospital, you should be instructed on drain care.  Call our office if you have any questions about your drains. 8. ACTIVITIES:  You may resume regular (light) daily activities beginning the next day--such as daily self-care, walking, climbing stairs--gradually increasing activities as tolerated.  You may have sexual intercourse when it is comfortable.  Refrain from any heavy  lifting or straining until approved by your doctor. a. You may drive when you are no longer taking prescription pain medication, you can comfortably wear a seatbelt, and you can safely maneuver your car and apply brakes. b. RETURN TO WORK:  __________________________________________________________ 9. You should see your doctor in  the office for a follow-up appointment approximately 3-5 days after your surgery.  Your doctors nurse will typically make your follow-up appointment when she calls you with your pathology report.  Expect your pathology report 2-3 business days after your surgery.  You may call to check if you do not hear from [place] after three days.   10. OTHER INSTRUCTIONS: ______________________________________________________________________________________________ ____________________________________________________________________________________________   WHEN TO CALL YOUR DOCTOR: 1. Fever over 101.0 2. Nausea and/or vomiting 3. Extreme swelling or bruising 4. Continued bleeding from incision. 5. Increased pain, redness, or drainage from the incision.   The clinic staff is available to answer your questions during regular business hours.  Please dont hesitate to call and ask to speak to one of the nurses for clinical concerns.  If you have a medical emergency, go to the nearest emergency room or call 911.  A surgeon from [facility] is always on call at the hospital. [address], [place], [place], [place]  [address] ? [address] [place], [place], [place]   [address] [phone] ? [phone] ? FAX [phone] About my Jackson-Pratt Bulb Drain  What is a Jackson-Pratt bulb? A Jackson-Pratt is a soft, round device used to collect drainage. It is connected to a long, thin drainage catheter, which is held in place by one or two small stiches near your surgical incision site. When the bulb is squeezed, it forms a vacuum, forcing the drainage to empty into the bulb.  Emptying the Jackson-Pratt bulb- To empty the bulb: 1. Release the plug on the top of the bulb. 2. Pour the bulb's contents into a measuring container which your nurse will provide. 3. Record the time emptied and amount of drainage. Empty the drain(s) as often as your     doctor or nurse recommends.  Date                   Time                    Amount (Drain 1)                 Amount (Drain 2)  _____________________________________________________________________  _____________________________________________________________________  _____________________________________________________________________  _____________________________________________________________________  _____________________________________________________________________  _____________________________________________________________________  _____________________________________________________________________  _____________________________________________________________________  Squeezing the Jackson-Pratt Bulb- To squeeze the bulb: 1. Make sure the plug at the top of the bulb is open. 2. Squeeze the bulb tightly in your fist. You will hear air squeezing from the bulb. 3. Replace the plug while the bulb is squeezed. 4. Use a safety pin to attach the bulb to your clothing. This will keep the catheter from     pulling at the bulb insertion site.  When to call your doctor- Call your doctor if:  Drain site becomes red, swollen or hot.  You have a fever greater than 101 degrees F.  There is oozing at the drain site.  Drain falls out (apply a guaze bandage over the drain hole and secure it with tape).  Drainage increases daily not related to activity patterns. (You will usually have more drainage when you are active than when you are resting.)  Drainage has a bad odor.

## 2013-09-09 NOTE — Op Note (Signed)
Preoperative Diagnosis: left breast cancer  Postoprative Diagnosis: left breast cancer  Procedure: Procedure(s): LEFT BREAST LUMPECTOMY WITH NEEDLE LOCALIZATION AND LEFT AXILLARY LYMPH NODE DISSECTION   Surgeon: Excell Seltzer T   Assistants:  None   Anesthesia:  General LMA anesthesia  Indications: patient is a 53 year old female with a recent diagnosis of stage IIA  (T1 N1) invasive lobular carcinoma of the left breast. After extensive workup and discussion regarding treatment and risks detailed elsewhere we elect to proceed with left breast lumpectomy, needle localized, and left axillary dissection as her initial surgical treatment. Following accurate needle localization at the breast center the patient is brought to the operating room for this procedure.  Procedure Detail:  Patient was brought to the operating room, placed in the supine position on the operating table, and laryngeal mask general anesthesia induced. The left breast and axilla and upper arm and chest were widely sterilely prepped and draped. She received preoperative IV antibiotics. PAS were in place. Patient time out was performed and correct procedure verified. The lumpectomy was approached initially. I made a curvilinear transversely oriented incision in the medial left breast over the area of the tumor and dissection was carried down through the subcutaneous tissue to the breast capsule. The tumor was located fairly posteriorly. The wire was brought into the incision. I was able to palpate an indistinct firm area. A generous lumpectomy was performed around this area encompassing the shaft and tip of the wire in order to obtain negative margins. The mass appeared posterior on imaging and felt posterior and I took the fascia off the chest wall as the posterior margin and the specimen was removed. It was inked for orientation. Specimen mammography was obtained showing the wire and clip well within the specimen although on 2  views it appeared somewhat closer to the superior than other margins. I therefore took an additional 1/2 cm superior margin which was oriented and sent as a separate specimen. The wound was irrigated and hemostasis obtained. The cavity was marked with clips. I mobilized the breast tissue off of the chest wall inferiorly and superiorly to bring it into the lumpectomy cavity and minimize the dead space. The breast tissue was closed with interrupted 3-0 Vicryl and subcutaneous was closed with interrupted 3-0 Vicryl and the skin with subcuticular 4-0 Monocryl and Dermabond. Attention was then turned to the axilla. A transverse incision was made in the mid axilla between the pectoralis and latissimus muscles. Dissection was carried down through the subcutaneous tissue. Dissection was deepened down to the pectoralis major anteriorly and its lateral border was defined. I came across the tail of Spence of the breast just below the low axilla. There was a firm palpable node in the low axilla, likely the previously biopsied lymph node. Posteriorly dissection was deepened down to the anterior border of the latissimus which was defined along its length. The pectoralis was retracted medially and the clavipectoral fascia incised. The pectoralis minor was retracted medially. Beginning at the medial border the pectoralis minor lymphatic and fibrofatty tissue was stripped down inferiorly and laterally. The axillary vein was identified early in the dissection was carefully protected. Dissection progressed laterally and perforating branches of the axillary artery and vein were divided between clips. The dissection continued laterally and attachments anterior lateral to the axillary vein were divided with cautery and the dissection was deepened laterally and the thoracodorsal vessels and nerve root were identified and protected. The specimen was dissected off the chest wall inferiorly. Identified what appeared  to be the long thoracic  nerve in appropriate position along the chest wall opposite the thoracodorsal nerve but was unable to get I nerve twitched from this. However it appeared anatomically correct and this was left with some surrounding tissue against the medial chest wall. All fibrofatty tissue between the 2 structures down to the subscapularis was swept inferiorly. There didn't appear to be a few firm slightly enlarged lymph nodes in the specimen. Dissection was carried inferiorly and final attachments along this radius and latissimus were divided and the specimen removed. The wound was thoroughly irrigated and hemostasis assured. A 19 Blake closed suction drain was left through a separate stab wound in the axilla. The subcutaneous tissue was closed with interrupted 3-0 Vicryl and the skin with subcuticular 4-0 Monocryl and Dermabond. Sponge needles and counts were correct. Gauze dressing was placed around the drain.    Findings: As above  Estimated Blood Loss:  Minimal         Drains: 19 round Blake drain in axilla  Blood Given: none          Specimens: #1 left breast lumpectomy    #2 further superior margin left breast lumpectomy     #3 left axillary contents        Complications:  * No complications entered in OR log *         Disposition: PACU - hemodynamically stable.         Condition: stable

## 2013-09-09 NOTE — Anesthesia Preprocedure Evaluation (Signed)
Anesthesia Evaluation  Patient identified by MRN, date of birth, ID band Patient awake    Reviewed: Allergy & Precautions, H&P , NPO status , Patient's Chart, lab work & pertinent test results  Airway Mallampati: I TM Distance: >3 FB Neck ROM: Full    Dental  (+) Teeth Intact, Dental Advisory Given   Pulmonary  breath sounds clear to auscultation        Cardiovascular Rhythm:Regular Rate:Normal     Neuro/Psych    GI/Hepatic GERD-  Medicated and Controlled,  Endo/Other    Renal/GU      Musculoskeletal   Abdominal   Peds  Hematology   Anesthesia Other Findings   Reproductive/Obstetrics                           Anesthesia Physical Anesthesia Plan  ASA: II  Anesthesia Plan: General   Post-op Pain Management:    Induction: Intravenous  Airway Management Planned: LMA  Additional Equipment:   Intra-op Plan:   Post-operative Plan: Extubation in OR  Informed Consent: I have reviewed the patients History and Physical, chart, labs and discussed the procedure including the risks, benefits and alternatives for the proposed anesthesia with the patient or authorized representative who has indicated his/her understanding and acceptance.   Dental advisory given  Plan Discussed with: Anesthesiologist, CRNA and Surgeon  Anesthesia Plan Comments:         Anesthesia Quick Evaluation

## 2013-09-09 NOTE — H&P (View-Only) (Signed)
Chief complaint: Followup left breast cancer  History: Patient returns for followup of her clinical stage IIA(T1 N1) invasive lobular cancer of the left breast. She has gone through genetic testing but the results are pending. She had questions regarding surgical treatment and we brought her in to discuss these. She was still questioning whether she would want bilateral mastectomy based on genetic testing. We went over the pros and cons of this in detail. She understands there is no survival benefit bilateral mastectomy. We discussed the possible various risks of genetic mutations. After our discussion she feels that she was to go ahead with treatment of her primary cancer regardless of genetic testing, with breast conservation. We discussed the procedure again in detail of needle localized lumpectomy and axillary dissection. We discussed risks of lymphedema and bleeding and infection in possible need for further surgery of margins were positive. All her questions were answered  Exam: Breasts: Again noted is a probable but very subtle medial left breast mass. I cannot feel any axillary adenopathy.  Assessment and plan: Stage II a invasive lobular cancer of the left breast. We plan to proceed with needle localized lumpectomy and axillary dissection week. Adjuvant treatment will depend on the final pathologic stage and Oncotype. All of her and her husband's questions were answered. 

## 2013-09-09 NOTE — Anesthesia Procedure Notes (Addendum)
Procedure Name: LMA Insertion Date/Time: 09/09/2013 9:54 AM Performed by: [name] Ripple D Pre-anesthesia Checklist: Patient identified, Emergency Drugs available, Suction available and Patient being monitored Patient Re-evaluated:Patient Re-evaluated prior to inductionOxygen Delivery Method: Circle System Utilized Preoxygenation: Pre-oxygenation with 100% oxygen Intubation Type: IV induction Ventilation: Mask ventilation without difficulty LMA: LMA inserted LMA Size: 4.0 Number of attempts: 1 Airway Equipment and Method: bite block Placement Confirmation: positive ETCO2 Tube secured with: Tape Dental Injury: Teeth and Oropharynx as per pre-operative assessment    Anesthesia Regional Block:  Pectoralis block  Pre-Anesthetic Checklist: ,, timeout performed, Correct Patient, Correct Site, Correct Laterality, Correct Procedure, Correct Position, site marked, Risks and benefits discussed,  Surgical consent,  Pre-op evaluation,  At surgeon's request and post-op pain management  Laterality: Left and Upper  Prep: chloraprep       Needles:  Injection technique: Single-shot  Needle Type: Echogenic Needle     Needle Length: 9cm 9 cm Needle Gauge: 21 and 21 G    Additional Needles:  Procedures: ultrasound guided (picture in chart) Pectoralis block Narrative:  Start time: 09/09/2013 9:31 AM End time: 09/09/2013 9:39 AM Injection made incrementally with aspirations every 5 mL.  Performed by: Personally  Anesthesiologist: Lorrene Reid, MD

## 2013-09-09 NOTE — Transfer of Care (Signed)
Immediate Anesthesia Transfer of Care Note  Patient: [name] [name]  Procedure(s) Performed: Procedure(s): LEFT BREAST LUMPECTOMY WITH NEEDLE LOCALIZATION AND LEFT AXILLARY LYMPH NODE DISSECTION (Left)  Patient Location: PACU  Anesthesia Type:General and Regional  Level of Consciousness: awake, alert  and oriented  Airway & Oxygen Therapy: Patient Spontanous Breathing and Patient connected to face mask oxygen  Post-op Assessment: Report given to PACU RN and Post -op Vital signs reviewed and stable  Post vital signs: Reviewed and stable  Complications: No apparent anesthesia complications

## 2013-09-09 NOTE — Anesthesia Postprocedure Evaluation (Signed)
  Anesthesia Post-op Note  Patient: Management consultant  Procedure(s) Performed: Procedure(s): LEFT BREAST LUMPECTOMY WITH NEEDLE LOCALIZATION AND LEFT AXILLARY LYMPH NODE DISSECTION (Left)  Patient Location: PACU  Anesthesia Type:General and GA combined with regional for post-op pain  Level of Consciousness: awake, alert  and oriented  Airway and Oxygen Therapy: Patient Spontanous Breathing  Post-op Pain: mild  Post-op Assessment: Post-op Vital signs reviewed  Post-op Vital Signs: Reviewed  Last Vitals:  Filed Vitals:   09/09/13 1330  BP: 135/84  Pulse: 68  Temp: 36.6 C  Resp: 14    Complications: No apparent anesthesia complications

## 2013-09-09 NOTE — Progress Notes (Signed)
Assisted Dr. Crews with left, ultrasound guided, pectoralis block. Side rails up, monitors on throughout procedure. See vital signs in flow sheet. Tolerated Procedure well. 

## 2013-09-09 NOTE — Interval H&P Note (Signed)
History and Physical Interval Note:  09/09/2013 9:18 AM  [name] [name]  has presented today for surgery, with the diagnosis of left breast cancer  The various methods of treatment have been discussed with the patient and family. After consideration of risks, benefits and other options for treatment, the patient has consented to  Procedure(s): LEFT BREAST LUMPECTOMY WITH NEEDLE LOCALIZATION AND LEFT AXILLARY NODE DISSECTION (Left) as a surgical intervention .  The patient's history has been reviewed, patient examined, no change in status, stable for surgery.  I have reviewed the patient's chart and labs.  Questions were answered to the patient's satisfaction.     Darene Lamer Amari Burnsworth

## 2013-09-10 ENCOUNTER — Encounter: Payer: Self-pay | Admitting: Genetic Counselor

## 2013-09-10 MED ORDER — HEPARIN SODIUM (PORCINE) 5000 UNIT/ML IJ SOLN
INTRAMUSCULAR | Status: AC
  Filled 2013-09-10: qty 1

## 2013-09-10 NOTE — Progress Notes (Signed)
Referring Physician:  Excell Seltzer, MD   [name] [name] was called today to discuss the first of her genetic test results. Please see the Genetics note from her visit on 08/28/13.   GENETIC TESTING: At the time of [name]' visit, we recommended she pursue genetic testing of multiple genes. Given the use of this information for surgical management, BRCAplus was ordered first. This test includs sequencing and deletion/duplication analysis of BRCA1, BRCA2, CDH1, PTEN, and p53 genes, and was performed at [facility]. Testing was normal and did not reveal a mutation in these genes. Testing for the other genes on the BreastNext panel is pending. Once results are obtained, [name] [name] will be called again.   [name] [name] stated that she had a lumpectomy on 09/09/13.   Steele Berg, MS, [place]  Certified Genetic Counseor  phone: [phone]  [email or web address]

## 2013-09-11 ENCOUNTER — Encounter (HOSPITAL_BASED_OUTPATIENT_CLINIC_OR_DEPARTMENT_OTHER): Payer: Self-pay | Admitting: General Surgery

## 2013-09-12 ENCOUNTER — Telehealth (INDEPENDENT_AMBULATORY_CARE_PROVIDER_SITE_OTHER): Payer: Self-pay | Admitting: General Surgery

## 2013-09-12 NOTE — Telephone Encounter (Signed)
Called the patient and discussed path report  

## 2013-09-15 ENCOUNTER — Telehealth: Payer: Self-pay | Admitting: *Deleted

## 2013-09-15 ENCOUNTER — Other Ambulatory Visit: Payer: Self-pay | Admitting: *Deleted

## 2013-09-15 DIAGNOSIS — C50212 Malignant neoplasm of upper-inner quadrant of left female breast: Secondary | ICD-10-CM

## 2013-09-15 NOTE — Telephone Encounter (Signed)
Called and spoke with patient to discuss starting chemotherapy.  Informed her she would need staging scans, echo and chemotherapy class.  I have put these orders in per Dr. Jana Hakim. Briefly discussed port a cath insertion and she states she will be seeing Dr. Excell Seltzer tomorrow.  I have sent him and his nurse an in basket to discuss further with her.  She is somewhat anxious but eager to get started.  Encouraged her to call with any needs or concerns.

## 2013-09-16 ENCOUNTER — Telehealth: Payer: Self-pay | Admitting: Oncology

## 2013-09-16 ENCOUNTER — Encounter (INDEPENDENT_AMBULATORY_CARE_PROVIDER_SITE_OTHER): Payer: Self-pay | Admitting: General Surgery

## 2013-09-16 ENCOUNTER — Ambulatory Visit (INDEPENDENT_AMBULATORY_CARE_PROVIDER_SITE_OTHER): Payer: BC Managed Care – PPO | Admitting: General Surgery

## 2013-09-16 VITALS — BP 114/74 | HR 71 | Temp 97.1°F | Resp 16 | Wt 151.2 lb

## 2013-09-16 DIAGNOSIS — Z09 Encounter for follow-up examination after completed treatment for conditions other than malignant neoplasm: Secondary | ICD-10-CM

## 2013-09-16 NOTE — Progress Notes (Signed)
History: Patient returns for her first postop check following left breast lumpectomy and axillary dissection. She's got a long well postoperatively without complication. Drain remains in place it is still draining about 20 cc per day of serosanguineous fluid and we elected to leave it in place today.  Exam: BP 114/74  Pulse 71  Temp(Src) 97.1 F (36.2 C) (Temporal)  Resp 16  Wt 151 lb 3.2 oz (68.584 kg) General: Appears well Breasts: Incision healing nicely without complication. Left axillary incision clean. Drainage of serosanguineous  We have previously discussed her pathology: Diagnosis 1. Breast, lumpectomy, Left - INVASIVE LOBULAR CARCINOMA, SEE COMMENT. - POSITIVE FOR LYMPH VASCULAR INVASION. - POSITIVE FOR PERINEURAL INVASION. - INVASIVE TUMOR IS 1 MM FROM NEAREST MARGIN (ANTERIOR). 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 - PREVIOUS BIOPSY SITE. - SEE TUMOR SYNOPTIC TEMPLATE BELOW. 2. Breast, lumpectomy, Left additional superior margin - LOBULAR NEOPLASIA (ATYPICAL LOBULAR HYPERPLASIA), SEE COMMENT. - MICROCALCIFICATIONS IDENTIFIED. 3. Lymph nodes, regional resection, Left axilla contents - EIGHT TOTAL LYMPH NODES, POSITIVE FOR MAMMARY CARCINOMA (8/23) SEE COMMENT - SIX LYMPH NODES, POSITIVE FOR METASTATIC MAMMARY CARCINOMA. - INTRANODAL TUMOR DEPOSITS ARE 3 MM, 8 MM, 8 MM, 8MM, 9 MM AND 10 MM. - EXTRACAPSULAR TUMOR EXTENSION IDENTIFIED. - TWO LYMPH NODES, POSITIVE FOR MICROMETASTATIC MAMMARY CARCINOMA. - ONE LYMPH NODE, POSITIVE FOR ISOLATED TUMOR CELLS.   Assessment and plan: Doing well following lumpectomy and axillary dissection as above. Leave drain for now. Dr. Magrinat is planning chemotherapy a Port-A-Cath was requested. We have scheduled this for June 1. I discussed the procedure in detail the patient and her husband including the indications and nature of the procedure as well as risks of bleeding, infection, anesthetic  complications, pneumothorax, DVT or catheter occlusion or displacement. All their questions were answered. I will see her back next week to consider drain removal 

## 2013-09-16 NOTE — Telephone Encounter (Signed)
, °

## 2013-09-16 NOTE — Telephone Encounter (Signed)
s/w pt re appt for ched 6/2 and confirmed 6/5. f/u also confirmed w/pt she has been contacted re pet/ct. pt aware i will call agian re echo. echo to managed care for preauth. left for linda - she will return tomorrow.

## 2013-09-17 ENCOUNTER — Telehealth: Payer: Self-pay | Admitting: Oncology

## 2013-09-17 ENCOUNTER — Encounter: Payer: Self-pay | Admitting: Oncology

## 2013-09-17 NOTE — Telephone Encounter (Signed)
lmonvm for pt re echo appt for 6/2 @ 10am. @ [facility]. also moved ched time on 6/2 from 5pm to 12pm. pt was made aware that i would call again w/echo appt and adjust ched if needed to accomadate as many appts on the same day as poss (per pt). confirmed 6/5 appt and mailed schedule. per ebony in managed care preauth# for echo [id number].

## 2013-09-18 ENCOUNTER — Encounter: Payer: Self-pay | Admitting: Oncology

## 2013-09-18 ENCOUNTER — Other Ambulatory Visit: Payer: Self-pay | Admitting: Oncology

## 2013-09-24 ENCOUNTER — Encounter (INDEPENDENT_AMBULATORY_CARE_PROVIDER_SITE_OTHER): Payer: Self-pay | Admitting: General Surgery

## 2013-09-24 ENCOUNTER — Ambulatory Visit (HOSPITAL_COMMUNITY)
Admission: RE | Admit: 2013-09-24 | Discharge: 2013-09-24 | Disposition: A | Discharge status: Home / Self Care | Payer: BC Managed Care – PPO | Source: Ambulatory Visit | Attending: Oncology | Admitting: Oncology

## 2013-09-24 ENCOUNTER — Encounter (HOSPITAL_COMMUNITY): Payer: Self-pay

## 2013-09-24 ENCOUNTER — Encounter (HOSPITAL_BASED_OUTPATIENT_CLINIC_OR_DEPARTMENT_OTHER): Payer: Self-pay | Admitting: *Deleted

## 2013-09-24 ENCOUNTER — Encounter (HOSPITAL_COMMUNITY)
Admission: RE | Admit: 2013-09-24 | Discharge: 2013-09-24 | Disposition: A | Discharge status: Home / Self Care | Payer: BC Managed Care – PPO | Source: Ambulatory Visit | Attending: Oncology | Admitting: Oncology

## 2013-09-24 ENCOUNTER — Ambulatory Visit (INDEPENDENT_AMBULATORY_CARE_PROVIDER_SITE_OTHER): Payer: BC Managed Care – PPO | Admitting: General Surgery

## 2013-09-24 VITALS — BP 122/72 | HR 84 | Temp 97.6°F | Ht 67.0 in | Wt 151.0 lb

## 2013-09-24 DIAGNOSIS — C50212 Malignant neoplasm of upper-inner quadrant of left female breast: Secondary | ICD-10-CM

## 2013-09-24 DIAGNOSIS — N200 Calculus of kidney: Secondary | ICD-10-CM | POA: Insufficient documentation

## 2013-09-24 DIAGNOSIS — C779 Secondary and unspecified malignant neoplasm of lymph node, unspecified: Secondary | ICD-10-CM | POA: Insufficient documentation

## 2013-09-24 DIAGNOSIS — C50919 Malignant neoplasm of unspecified site of unspecified female breast: Secondary | ICD-10-CM | POA: Insufficient documentation

## 2013-09-24 DIAGNOSIS — IMO0002 Reserved for concepts with insufficient information to code with codable children: Secondary | ICD-10-CM | POA: Insufficient documentation

## 2013-09-24 DIAGNOSIS — Y836 Removal of other organ (partial) (total) as the cause of abnormal reaction of the patient, or of later complication, without mention of misadventure at the time of the procedure: Secondary | ICD-10-CM | POA: Insufficient documentation

## 2013-09-24 DIAGNOSIS — Z9010 Acquired absence of unspecified breast and nipple: Secondary | ICD-10-CM | POA: Insufficient documentation

## 2013-09-24 DIAGNOSIS — C50219 Malignant neoplasm of upper-inner quadrant of unspecified female breast: Secondary | ICD-10-CM

## 2013-09-24 LAB — GLUCOSE, CAPILLARY: Glucose-Capillary: 95 mg/dL (ref 70–99)

## 2013-09-24 MED ORDER — FLUDEOXYGLUCOSE F - 18 (FDG) INJECTION
8.2000 | Freq: Once | INTRAVENOUS | Status: AC | PRN
Start: 2013-09-24 — End: 2013-09-24
  Administered 2013-09-24: 8.2 via INTRAVENOUS

## 2013-09-24 MED ORDER — IOHEXOL 300 MG/ML  SOLN
80.0000 mL | Freq: Once | INTRAMUSCULAR | Status: AC | PRN
  Administered 2013-09-24: 80 mL via INTRAVENOUS

## 2013-09-24 NOTE — Progress Notes (Signed)
                                                                                                                                                                                                                                                                                                                                                                                                                                                                                                                                                                                                                                                                     History: The patient returns for further followup status post left breast lumpectomy and axillary lymph node dissection. Her JP drainage is now minimal.  Exam: BP 122/72  Pulse 84  Temp(Src) 97.6 F (36.4 C)  Ht 5\' 7"  (1.702 m)  Wt 151 lb (68.493 kg)  BMI 23.64 kg/m2 Her wounds are nicely healed. I removed the JP drain. No evidence of infection.  Assessment and plan: Doing well following surgery as above. Port-A-Cath placement is planned next week. All her questions were answered.

## 2013-09-25 ENCOUNTER — Other Ambulatory Visit: Payer: BC Managed Care – PPO

## 2013-09-26 ENCOUNTER — Telehealth (INDEPENDENT_AMBULATORY_CARE_PROVIDER_SITE_OTHER): Payer: Self-pay

## 2013-09-26 ENCOUNTER — Encounter: Payer: Self-pay | Admitting: Genetic Counselor

## 2013-09-26 NOTE — Telephone Encounter (Signed)
CDS calling requesting surgery orders be placed in epic for Surgery. I advised her I will send request to Dr Excell Seltzer.

## 2013-09-26 NOTE — Progress Notes (Signed)
Referring Physician: Benjamin Hoxworth, MD   Ms. Rumpf was called today to discuss genetic test results. Please see the Genetics note from her visit on 08/28/13.  GENETIC TESTING: At the time of Ms. Berrios' visit, we recommended she pursue genetic testing of multiple genes. We started with the BRCAplus panel and proceeded to the BreastNext panel since the first test was negative. These tests, which included sequencing and deletion/duplication analysis of a total of 17 genes, were performed at [facility]. Testing was normal and did not reveal a mutation in any of these genes. The genes tested were ATM, BARD1, BRCA1, BRCA2, BRIP1, CDH1, CHEK2, MRE11A, MUTYH, NBN, NF1, PALB2, PTEN, RAD50, RAD51C, RAD51D, and TP53.  We discussed with Ms. Spohr that since the current tests are not perfect, it is possible there may be a gene mutation that current testing cannot detect, but that chance is small. We also discussed that it is possible that a different genetic factor, which is not on this panel or has not yet been discovered, is responsible for the cancer diagnoses in the family. While all the breast cancers seem to be occuring post-menopause, 4 of the 5 woomen in her mother's generation had breast cancer.    CANCER SCREENING: Given Ms. Vogler' personal and family histories, we must interpret these negative results with some caution. Families with features suggestive of hereditary risk tend to have multiple family members with cancer, diagnosed in multiple generations. Ms. Hynes' family exhibits this and her result may simply reflect our limited knowledge of all genes involved in hereditary breast cancer risk. We will defer her cancer screenings to her overseeing providers.  FAMILY MEMBERS: Women in the family are at increased risk of developing breast cancer, over the general population risk, simply due to the family history. We recommended they have a yearly mammogram beginning at age 40, a yearly clinical  breast exam, and perform monthly breast self-exams. A gynecologic exam is recommended yearly. Colon cancer screening is recommended to begin by age 50.  Lastly, we discussed with Ms. Recker that cancer genetics is a rapidly advancing field and it is possible that new genetic tests will be appropriate for her in the future. We encouraged her to remain in contact with us on an annual basis so we can update her personal and family histories, and let her know of advances in cancer genetics that may benefit the family. Our contact number was provided. Ms. Vaccaro's questions were answered to her satisfaction today, and she knows she is welcome to call anytime with additional questions.    Ofri Leitner, MS, CGC Certified Genetic Counseor phone: [phone] [email or web address]  

## 2013-09-28 ENCOUNTER — Other Ambulatory Visit: Payer: Self-pay | Admitting: Oncology

## 2013-09-29 ENCOUNTER — Encounter (HOSPITAL_BASED_OUTPATIENT_CLINIC_OR_DEPARTMENT_OTHER): Payer: BC Managed Care – PPO | Admitting: Anesthesiology

## 2013-09-29 ENCOUNTER — Encounter (HOSPITAL_BASED_OUTPATIENT_CLINIC_OR_DEPARTMENT_OTHER)
Admission: RE | Disposition: A | Discharge status: Home / Self Care | Payer: Self-pay | Source: Ambulatory Visit | Attending: General Surgery

## 2013-09-29 ENCOUNTER — Encounter (HOSPITAL_BASED_OUTPATIENT_CLINIC_OR_DEPARTMENT_OTHER): Payer: Self-pay

## 2013-09-29 ENCOUNTER — Ambulatory Visit (HOSPITAL_BASED_OUTPATIENT_CLINIC_OR_DEPARTMENT_OTHER)
Admission: RE | Admit: 2013-09-29 | Discharge: 2013-09-29 | Disposition: A | Discharge status: Home / Self Care | Payer: BC Managed Care – PPO | Source: Ambulatory Visit | Attending: General Surgery | Admitting: General Surgery

## 2013-09-29 ENCOUNTER — Other Ambulatory Visit (INDEPENDENT_AMBULATORY_CARE_PROVIDER_SITE_OTHER): Payer: Self-pay | Admitting: General Surgery

## 2013-09-29 ENCOUNTER — Ambulatory Visit (HOSPITAL_COMMUNITY): Payer: BC Managed Care – PPO

## 2013-09-29 ENCOUNTER — Ambulatory Visit (HOSPITAL_BASED_OUTPATIENT_CLINIC_OR_DEPARTMENT_OTHER): Payer: BC Managed Care – PPO | Admitting: Anesthesiology

## 2013-09-29 DIAGNOSIS — K219 Gastro-esophageal reflux disease without esophagitis: Secondary | ICD-10-CM | POA: Insufficient documentation

## 2013-09-29 DIAGNOSIS — Z87891 Personal history of nicotine dependence: Secondary | ICD-10-CM | POA: Insufficient documentation

## 2013-09-29 DIAGNOSIS — C50212 Malignant neoplasm of upper-inner quadrant of left female breast: Secondary | ICD-10-CM

## 2013-09-29 DIAGNOSIS — C50919 Malignant neoplasm of unspecified site of unspecified female breast: Secondary | ICD-10-CM | POA: Insufficient documentation

## 2013-09-29 HISTORY — DX: Presence of spectacles and contact lenses: Z97.3

## 2013-09-29 HISTORY — PX: PORTACATH PLACEMENT: SHX2246

## 2013-09-29 SURGERY — INSERTION, TUNNELED CENTRAL VENOUS DEVICE, WITH PORT
Anesthesia: General | Site: Chest | Laterality: Right

## 2013-09-29 MED ORDER — FENTANYL CITRATE 0.05 MG/ML IJ SOLN
INTRAMUSCULAR | Status: AC
  Filled 2013-09-29: qty 4

## 2013-09-29 MED ORDER — ONDANSETRON HCL 4 MG/2ML IJ SOLN
INTRAMUSCULAR | Status: DC | PRN
  Administered 2013-09-29: 4 mg via INTRAVENOUS

## 2013-09-29 MED ORDER — PROPOFOL 10 MG/ML IV BOLUS
INTRAVENOUS | Status: DC | PRN
  Administered 2013-09-29: 150 mg via INTRAVENOUS

## 2013-09-29 MED ORDER — LIDOCAINE HCL (CARDIAC) 20 MG/ML IV SOLN
INTRAVENOUS | Status: DC | PRN
  Administered 2013-09-29: 30 mg via INTRAVENOUS

## 2013-09-29 MED ORDER — HEPARIN SOD (PORK) LOCK FLUSH 100 UNIT/ML IV SOLN
INTRAVENOUS | Status: AC
  Filled 2013-09-29: qty 5

## 2013-09-29 MED ORDER — MIDAZOLAM HCL 2 MG/2ML IJ SOLN: 1.0000 mg | INTRAMUSCULAR | Status: DC | PRN

## 2013-09-29 MED ORDER — MIDAZOLAM HCL 2 MG/2ML IJ SOLN: 0.5000 mg | Freq: Once | INTRAMUSCULAR | Status: DC | PRN

## 2013-09-29 MED ORDER — PROPOFOL 10 MG/ML IV BOLUS
INTRAVENOUS | Status: AC
Start: 2013-09-29 — End: 2013-09-29
  Filled 2013-09-29: qty 20

## 2013-09-29 MED ORDER — LACTATED RINGERS IV SOLN
INTRAVENOUS | Status: DC
  Administered 2013-09-29 (×2): via INTRAVENOUS

## 2013-09-29 MED ORDER — OXYCODONE HCL 5 MG PO TABS: 5.0000 mg | ORAL_TABLET | Freq: Once | ORAL | Status: DC | PRN

## 2013-09-29 MED ORDER — MIDAZOLAM HCL 2 MG/2ML IJ SOLN
INTRAMUSCULAR | Status: AC
  Filled 2013-09-29: qty 2

## 2013-09-29 MED ORDER — BUPIVACAINE-EPINEPHRINE 0.25% -1:200000 IJ SOLN
INTRAMUSCULAR | Status: DC | PRN
  Administered 2013-09-29: 13 mL

## 2013-09-29 MED ORDER — OXYCODONE HCL 5 MG PO TABS: 5.0000 mg | ORAL_TABLET | Freq: Four times a day (QID) | ORAL | Status: DC | PRN

## 2013-09-29 MED ORDER — LIDOCAINE HCL (PF) 1 % IJ SOLN
INTRAMUSCULAR | Status: AC
  Filled 2013-09-29: qty 30

## 2013-09-29 MED ORDER — DEXAMETHASONE SODIUM PHOSPHATE 4 MG/ML IJ SOLN
INTRAMUSCULAR | Status: DC | PRN
  Administered 2013-09-29: 8 mg via INTRAVENOUS

## 2013-09-29 MED ORDER — FENTANYL CITRATE 0.05 MG/ML IJ SOLN
INTRAMUSCULAR | Status: DC | PRN
  Administered 2013-09-29: 100 ug via INTRAVENOUS

## 2013-09-29 MED ORDER — HEPARIN (PORCINE) IN NACL 2-0.9 UNIT/ML-% IJ SOLN
INTRAMUSCULAR | Status: DC | PRN
  Administered 2013-09-29: 1 via INTRAVENOUS

## 2013-09-29 MED ORDER — PROMETHAZINE HCL 25 MG/ML IJ SOLN: 6.2500 mg | INTRAMUSCULAR | Status: DC | PRN

## 2013-09-29 MED ORDER — CEFAZOLIN SODIUM-DEXTROSE 2-3 GM-% IV SOLR
INTRAVENOUS | Status: AC
  Filled 2013-09-29: qty 50

## 2013-09-29 MED ORDER — MEPERIDINE HCL 25 MG/ML IJ SOLN: 6.2500 mg | INTRAMUSCULAR | Status: DC | PRN

## 2013-09-29 MED ORDER — CEFAZOLIN SODIUM-DEXTROSE 2-3 GM-% IV SOLR
2.0000 g | INTRAVENOUS | Status: AC
  Administered 2013-09-29: 2 g via INTRAVENOUS

## 2013-09-29 MED ORDER — SODIUM BICARBONATE 4 % IV SOLN
INTRAVENOUS | Status: AC
  Filled 2013-09-29: qty 5

## 2013-09-29 MED ORDER — MIDAZOLAM HCL 5 MG/5ML IJ SOLN
INTRAMUSCULAR | Status: DC | PRN
  Administered 2013-09-29: 2 mg via INTRAVENOUS

## 2013-09-29 MED ORDER — OXYCODONE HCL 5 MG/5ML PO SOLN: 5.0000 mg | Freq: Once | ORAL | Status: DC | PRN

## 2013-09-29 MED ORDER — HEPARIN (PORCINE) IN NACL 2-0.9 UNIT/ML-% IJ SOLN
INTRAMUSCULAR | Status: AC
  Filled 2013-09-29: qty 500

## 2013-09-29 MED ORDER — HEPARIN SOD (PORK) LOCK FLUSH 100 UNIT/ML IV SOLN
INTRAVENOUS | Status: DC | PRN
  Administered 2013-09-29: 500 [IU] via INTRAVENOUS

## 2013-09-29 MED ORDER — FENTANYL CITRATE 0.05 MG/ML IJ SOLN: 25.0000 ug | INTRAMUSCULAR | Status: DC | PRN

## 2013-09-29 MED ORDER — FENTANYL CITRATE 0.05 MG/ML IJ SOLN: 50.0000 ug | INTRAMUSCULAR | Status: DC | PRN

## 2013-09-29 MED ORDER — BUPIVACAINE-EPINEPHRINE (PF) 0.25% -1:200000 IJ SOLN
INTRAMUSCULAR | Status: AC
  Filled 2013-09-29: qty 30

## 2013-09-29 SURGICAL SUPPLY — 41 items
BAG DECANTER FOR FLEXI CONT (MISCELLANEOUS) ×3 IMPLANT
BANDAGE ADH SHEER 1  50/CT (GAUZE/BANDAGES/DRESSINGS) ×3 IMPLANT
BLADE 15 SAFETY STRL DISP (BLADE) ×3 IMPLANT
CHLORAPREP W/TINT 26ML (MISCELLANEOUS) ×3 IMPLANT
COVER MAYO STAND STRL (DRAPES) ×3 IMPLANT
COVER TABLE BACK 60X90 (DRAPES) ×3 IMPLANT
DECANTER SPIKE VIAL GLASS SM (MISCELLANEOUS) IMPLANT
DERMABOND ADVANCED (GAUZE/BANDAGES/DRESSINGS) ×2
DERMABOND ADVANCED .7 DNX12 (GAUZE/BANDAGES/DRESSINGS) ×1 IMPLANT
DRAPE C-ARM 42X72 X-RAY (DRAPES) ×3 IMPLANT
DRAPE LAPAROTOMY T 102X78X121 (DRAPES) ×3 IMPLANT
DRAPE UTILITY XL STRL (DRAPES) ×3 IMPLANT
ELECT REM PT RETURN 9FT ADLT (ELECTROSURGICAL) ×3
ELECTRODE REM PT RTRN 9FT ADLT (ELECTROSURGICAL) ×1 IMPLANT
GLOVE BIOGEL PI IND STRL 8 (GLOVE) ×1 IMPLANT
GLOVE BIOGEL PI INDICATOR 8 (GLOVE) ×2
GLOVE SS BIOGEL STRL SZ 7.5 (GLOVE) ×1 IMPLANT
GLOVE SUPERSENSE BIOGEL SZ 7.5 (GLOVE) ×2
GLOVE SURG SS PI 7.0 STRL IVOR (GLOVE) ×3 IMPLANT
GOWN STRL REUS W/ TWL LRG LVL3 (GOWN DISPOSABLE) ×1 IMPLANT
GOWN STRL REUS W/ TWL XL LVL3 (GOWN DISPOSABLE) ×1 IMPLANT
GOWN STRL REUS W/TWL LRG LVL3 (GOWN DISPOSABLE) ×2
GOWN STRL REUS W/TWL XL LVL3 (GOWN DISPOSABLE) ×2
IV CATH PLACEMENT UNIT 16 GA (IV SOLUTION) IMPLANT
IV KIT MINILOC 20X1 SAFETY (NEEDLE) IMPLANT
KIT BARDPORT ISP (Port) IMPLANT
KIT PORT POWER 8FR ISP CVUE (Catheter) ×3 IMPLANT
NEEDLE HYPO 22GX1.5 SAFETY (NEEDLE) ×3 IMPLANT
NEEDLE HYPO 25X1 1.5 SAFETY (NEEDLE) ×3 IMPLANT
PACK BASIN DAY SURGERY FS (CUSTOM PROCEDURE TRAY) ×3 IMPLANT
PENCIL BUTTON HOLSTER BLD 10FT (ELECTRODE) ×3 IMPLANT
SET SHEATH INTRODUCER 10FR (MISCELLANEOUS) IMPLANT
SHEATH COOK PEEL AWAY SET 9F (SHEATH) IMPLANT
SLEEVE SCD COMPRESS KNEE MED (MISCELLANEOUS) ×3 IMPLANT
SUT MON AB 4-0 PC3 18 (SUTURE) ×3 IMPLANT
SUT PROLENE 2 0 CT2 30 (SUTURE) ×3 IMPLANT
SUT SILK 4 0 TIES 17X18 (SUTURE) IMPLANT
SYR 5ML LUER SLIP (SYRINGE) ×3 IMPLANT
SYR CONTROL 10ML LL (SYRINGE) ×3 IMPLANT
TOWEL OR 17X24 6PK STRL BLUE (TOWEL DISPOSABLE) ×6 IMPLANT
TOWEL OR NON WOVEN STRL DISP B (DISPOSABLE) ×3 IMPLANT

## 2013-09-29 NOTE — Anesthesia Preprocedure Evaluation (Addendum)
Anesthesia Evaluation  Patient identified by MRN, date of birth, ID band Patient awake    Reviewed: Allergy & Precautions, H&P , NPO status , Patient's Chart, lab work & pertinent test results  History of Anesthesia Complications Negative for: history of anesthetic complications  Airway Mallampati: I TM Distance: >3 FB Neck ROM: Full    Dental  (+) Teeth Intact, Dental Advisory Given   Pulmonary former smoker (remote history),  breath sounds clear to auscultation  Pulmonary exam normal       Cardiovascular negative cardio ROS  Rhythm:Regular Rate:Normal     Neuro/Psych negative neurological ROS     GI/Hepatic Neg liver ROS, GERD-  Poorly Controlled,  Endo/Other  negative endocrine ROS  Renal/GU negative Renal ROS     Musculoskeletal   Abdominal   Peds  Hematology negative hematology ROS (+)   Anesthesia Other Findings Breast cancer  Reproductive/Obstetrics S/p ablation                         Anesthesia Physical Anesthesia Plan  ASA: II  Anesthesia Plan: General   Post-op Pain Management:    Induction: Intravenous  Airway Management Planned: Oral ETT  Additional Equipment:   Intra-op Plan:   Post-operative Plan: Extubation in OR  Informed Consent: I have reviewed the patients History and Physical, chart, labs and discussed the procedure including the risks, benefits and alternatives for the proposed anesthesia with the patient or authorized representative who has indicated his/her understanding and acceptance.   Dental advisory given  Plan Discussed with: CRNA and Surgeon  Anesthesia Plan Comments: (Plan routine monitors, GETA )        Anesthesia Quick Evaluation

## 2013-09-29 NOTE — Interval H&P Note (Signed)
History and Physical Interval Note:  09/29/2013 1:37 PM  [name] [name]  has presented today for surgery, with the diagnosis of breast cancer  The various methods of treatment have been discussed with the patient and family. After consideration of risks, benefits and other options for treatment, the patient has consented to  Procedure(s): INSERTION PORT-A-CATH (N/A) as a surgical intervention .  The patient's history has been reviewed, patient examined, no change in status, stable for surgery.  I have reviewed the patient's chart and labs.  Questions were answered to the patient's satisfaction.     Darene Lamer Chaston Bradburn

## 2013-09-29 NOTE — Anesthesia Postprocedure Evaluation (Signed)
  Anesthesia Post-op Note  Patient: Management consultant  Procedure(s) Performed: Procedure(s): INSERTION PORT-A-CATH (Right)  Patient Location: PACU  Anesthesia Type:General  Level of Consciousness: awake, alert , oriented and patient cooperative  Airway and Oxygen Therapy: Patient Spontanous Breathing  Post-op Pain: mild  Post-op Assessment: Post-op Vital signs reviewed, Patient's Cardiovascular Status Stable, Respiratory Function Stable, Patent Airway, No signs of Nausea or vomiting and Pain level controlled  Post-op Vital Signs: Reviewed and stable  Last Vitals:  Filed Vitals:   09/29/13 1500  BP: 138/85  Pulse: 91  Temp:   Resp: 14    Complications: No apparent anesthesia complications

## 2013-09-29 NOTE — Anesthesia Procedure Notes (Signed)
Procedure Name: Intubation Date/Time: 09/29/2013 1:56 PM Performed by: Lyndee Leo Pre-anesthesia Checklist: Patient identified, Emergency Drugs available, Suction available and Patient being monitored Patient Re-evaluated:Patient Re-evaluated prior to inductionOxygen Delivery Method: Circle System Utilized Preoxygenation: Pre-oxygenation with 100% oxygen Intubation Type: IV induction Ventilation: Mask ventilation without difficulty Laryngoscope Size: Mac and 3 Grade View: Grade II Tube type: Oral Tube size: 7.0 mm Number of attempts: 1 Airway Equipment and Method: stylet and oral airway Placement Confirmation: ETT inserted through vocal cords under direct vision,  positive ETCO2 and breath sounds checked- equal and bilateral Secured at: 20 cm Tube secured with: Tape Dental Injury: Teeth and Oropharynx as per pre-operative assessment

## 2013-09-29 NOTE — Op Note (Signed)
Preoperative diagnosis: Cancer of the breast and the poor venous access  Postoperative diagnosis: Same  Procedure: Placement of ClearVue subcutaneous venous port  Surgeon: Excell Seltzer M.D.  Anesthesia: LMA General  Description of procedure: Patient is brought to the operating room and placed in the supine position on the operating table. IV sedation was administered. The entire upper chest and neck were widely sterilely prepped and draped. Local anesthesia was used to infiltrate the insertion of port site. The right IJ vein was cannulated with a needle and guidewire without difficulty after I was unable to cannulate the right subclavian veiv and position in the superior vena cava was confirmed by fluoroscopy. The introducer was then placed over the guidewire and the flushed catheter placed via the introducer which was stripped away and the tip of the catheter positioned near the cavoatrial junction. A small transverse incision was made in the anterior chest wall and subcutaneous pocket created. The catheter was tunneled into the pocket, trimmed to length, and attached to the flushed port which was positioned in the pocket. The port was sutured to the chest wall with interrupted 2-0 Prolene. The incisions were closed with subcutaneous interrupted Monocryl and the skin incisions closed with subcuticular Monocryl and Dermabond. The port was accessed and flushed and aspirated easily and was left flushed with concentrated heparin solution. Sponge needle as the counts were correct. The patient was taken to recovery in good condition.  Darene Lamer Louann Hopson  09/29/2013

## 2013-09-29 NOTE — H&P (View-Only) (Signed)
History: Patient returns for her first postop check following left breast lumpectomy and axillary dissection. She's got a long well postoperatively without complication. Drain remains in place it is still draining about 20 cc per day of serosanguineous fluid and we elected to leave it in place today.  Exam: BP 114/74  Pulse 71  Temp(Src) 97.1 F (36.2 C) (Temporal)  Resp 16  Wt 151 lb 3.2 oz (68.584 kg) General: Appears well Breasts: Incision healing nicely without complication. Left axillary incision clean. Drainage of serosanguineous  We have previously discussed her pathology: Diagnosis 1. Breast, lumpectomy, Left - INVASIVE LOBULAR CARCINOMA, SEE COMMENT. - POSITIVE FOR LYMPH VASCULAR INVASION. - POSITIVE FOR PERINEURAL INVASION. - INVASIVE TUMOR IS 1 MM FROM NEAREST MARGIN (ANTERIOR). 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 - PREVIOUS BIOPSY SITE. - SEE TUMOR SYNOPTIC TEMPLATE BELOW. 2. Breast, lumpectomy, Left additional superior margin - LOBULAR NEOPLASIA (ATYPICAL LOBULAR HYPERPLASIA), SEE COMMENT. - MICROCALCIFICATIONS IDENTIFIED. 3. Lymph nodes, regional resection, Left axilla contents - EIGHT TOTAL LYMPH NODES, POSITIVE FOR MAMMARY CARCINOMA (8/23) SEE COMMENT - SIX LYMPH NODES, POSITIVE FOR METASTATIC MAMMARY CARCINOMA. - INTRANODAL TUMOR DEPOSITS ARE 3 MM, 8 MM, 8 MM, 8MM, 9 MM AND 10 MM. - EXTRACAPSULAR TUMOR EXTENSION IDENTIFIED. - TWO LYMPH NODES, POSITIVE FOR MICROMETASTATIC MAMMARY CARCINOMA. - ONE LYMPH NODE, POSITIVE FOR ISOLATED TUMOR CELLS.   Assessment and plan: Doing well following lumpectomy and axillary dissection as above. Leave drain for now. Dr. Jana Hakim is planning chemotherapy a Port-A-Cath was requested. We have scheduled this for June 1. I discussed the procedure in detail the patient and her husband including the indications and nature of the procedure as well as risks of bleeding, infection, anesthetic  complications, pneumothorax, DVT or catheter occlusion or displacement. All their questions were answered. I will see her back next week to consider drain removal

## 2013-09-29 NOTE — Transfer of Care (Signed)
Immediate Anesthesia Transfer of Care Note  Patient: [name] [name]  Procedure(s) Performed: Procedure(s): INSERTION PORT-A-CATH (Right)  Patient Location: PACU  Anesthesia Type:General  Level of Consciousness: awake, alert  and oriented  Airway & Oxygen Therapy: Patient Spontanous Breathing and Patient connected to face mask oxygen  Post-op Assessment: Report given to PACU RN and Post -op Vital signs reviewed and stable  Post vital signs: Reviewed and stable  Complications: No apparent anesthesia complications

## 2013-09-29 NOTE — Discharge Instructions (Signed)
° ° °PORT-A-CATH: POST OP INSTRUCTIONS ° °Always review your discharge instruction sheet given to you by the facility where your surgery was performed.  ° °1. A prescription for pain medication may be given to you upon discharge. Take your pain medication as prescribed, if needed. If narcotic pain medicine is not needed, then you make take acetaminophen (Tylenol) or ibuprofen (Advil) as needed.  °2. Take your usually prescribed medications unless otherwise directed. °3. If you need a refill on your pain medication, please contact our office. All narcotic pain medicine now requires a paper prescription.  Phoned in and fax refills are no longer allowed by law.  Prescriptions will not be filled after 5 pm or on weekends.  °4. You should follow a light diet for the remainder of the day after your procedure. °5. Most patients will experience some mild swelling and/or bruising in the area of the incision. It may take several days to resolve. °6. It is common to experience some constipation if taking pain medication after surgery. Increasing fluid intake and taking a stool softener (such as Colace) will usually help or prevent this problem from occurring. A mild laxative (Milk of Magnesia or Miralax) should be taken according to package directions if there are no bowel movements after 48 hours.  °7. Unless discharge instructions indicate otherwise, you may remove your bandages 48 hours after surgery, and you may shower at that time. You may have steri-strips (small white skin tapes) in place directly over the incision.  These strips should be left on the skin for 7-10 days.  If your surgeon used Dermabond (skin glue) on the incision, you may shower in 24 hours.  The glue will flake off over the next 2-3 weeks.  °8. If your port is left accessed at the end of surgery (needle left in port), the dressing cannot get wet and should only by changed by a healthcare professional. When the port is no longer accessed (when the  needle has been removed), follow step 7.   °9. ACTIVITIES:  Limit activity involving your arms for the next 72 hours. Do no strenuous exercise or activity for 1 week. You may drive when you are no longer taking prescription pain medication, you can comfortably wear a seatbelt, and you can maneuver your car. °10.You may need to see your doctor in the office for a follow-up appointment.  Please °      check with your doctor.  °11.When you receive a new Port-a-Cath, you will get a product guide and  °      ID card.  Please keep them in case you need them. ° °WHEN TO CALL YOUR DOCTOR ([phone]): °1. Fever over 101.0 °2. Chills °3. Continued bleeding from incision °4. Increased redness and tenderness at the site °5. Shortness of breath, difficulty breathing ° ° °The clinic staff is available to answer your questions during regular business hours. Please don’t hesitate to call and ask to speak to one of the nurses or medical assistants for clinical concerns. If you have a medical emergency, go to the nearest emergency room or call 911.  A surgeon from [facility] is always on call at the hospital.  ° ° ° °For further information, please visit [email or web address] ° ° °Post Anesthesia Home Care Instructions ° °Activity: °Get plenty of rest for the remainder of the day. A responsible adult should stay with you for 24 hours following the procedure.  °For the next 24 hours, DO NOT: °-Drive a car °-  Operate machinery °-Drink alcoholic beverages °-Take any medication unless instructed by your physician °-Make any legal decisions or sign important papers. ° °Meals: °Start with liquid foods such as gelatin or soup. Progress to regular foods as tolerated. Avoid greasy, spicy, heavy foods. If nausea and/or vomiting occur, drink only clear liquids until the nausea and/or vomiting subsides. Call your physician if vomiting continues. ° °Special Instructions/Symptoms: °Your throat may feel dry or sore from the  anesthesia or the breathing tube placed in your throat during surgery. If this causes discomfort, gargle with warm salt water. The discomfort should disappear within 24 hours. ° ° ° ° ° ° °

## 2013-09-30 ENCOUNTER — Other Ambulatory Visit: Payer: BC Managed Care – PPO

## 2013-09-30 ENCOUNTER — Ambulatory Visit (HOSPITAL_COMMUNITY): Payer: BC Managed Care – PPO

## 2013-09-30 ENCOUNTER — Encounter: Payer: Self-pay | Admitting: Oncology

## 2013-09-30 ENCOUNTER — Ambulatory Visit (HOSPITAL_COMMUNITY)
Admission: RE | Admit: 2013-09-30 | Discharge: 2013-09-30 | Disposition: A | Discharge status: Home / Self Care | Payer: BC Managed Care – PPO | Source: Ambulatory Visit | Attending: Oncology | Admitting: Oncology

## 2013-09-30 ENCOUNTER — Encounter: Payer: Self-pay | Admitting: *Deleted

## 2013-09-30 DIAGNOSIS — C50219 Malignant neoplasm of upper-inner quadrant of unspecified female breast: Secondary | ICD-10-CM | POA: Insufficient documentation

## 2013-09-30 DIAGNOSIS — C50212 Malignant neoplasm of upper-inner quadrant of left female breast: Secondary | ICD-10-CM

## 2013-09-30 DIAGNOSIS — C50919 Malignant neoplasm of unspecified site of unspecified female breast: Secondary | ICD-10-CM

## 2013-09-30 LAB — POCT HEMOGLOBIN-HEMACUE: HEMOGLOBIN: 15.4 g/dL — AB (ref 12.0–15.0)

## 2013-09-30 NOTE — Progress Notes (Signed)
No date for treatment as of today. See notes from Dr. Jana Hakim She will see me again in a couple of weeks after her definitive surgery. By then we should be able to decide whether a) she agrees to be treated according to the standard of care, which would be chemotherapy, b) refuses chemotherapy given the fact that this is a luminal a like tumor and chemotherapy is expected to be less effective, or c) she chooses to participate in the SWOG study, which is my recommendation.

## 2013-09-30 NOTE — Progress Notes (Signed)
  Echocardiogram 2D Echocardiogram has been performed.  [name] [name] 09/30/2013, 12:12 PM

## 2013-10-01 ENCOUNTER — Encounter (HOSPITAL_BASED_OUTPATIENT_CLINIC_OR_DEPARTMENT_OTHER): Payer: Self-pay | Admitting: General Surgery

## 2013-10-02 ENCOUNTER — Encounter: Payer: Self-pay | Admitting: Oncology

## 2013-10-02 NOTE — Progress Notes (Signed)
Put fmla form in registration desk °

## 2013-10-03 ENCOUNTER — Telehealth: Payer: Self-pay | Admitting: *Deleted

## 2013-10-03 ENCOUNTER — Telehealth: Payer: Self-pay | Admitting: Oncology

## 2013-10-03 ENCOUNTER — Ambulatory Visit (HOSPITAL_BASED_OUTPATIENT_CLINIC_OR_DEPARTMENT_OTHER): Payer: BC Managed Care – PPO | Admitting: Oncology

## 2013-10-03 VITALS — BP 130/89 | HR 94 | Temp 98.0°F | Resp 18 | Ht 67.0 in | Wt 151.6 lb

## 2013-10-03 DIAGNOSIS — Z170 Estrogen receptor positive status [ER+]: Secondary | ICD-10-CM

## 2013-10-03 DIAGNOSIS — C50919 Malignant neoplasm of unspecified site of unspecified female breast: Secondary | ICD-10-CM

## 2013-10-03 DIAGNOSIS — C773 Secondary and unspecified malignant neoplasm of axilla and upper limb lymph nodes: Secondary | ICD-10-CM

## 2013-10-03 DIAGNOSIS — F411 Generalized anxiety disorder: Secondary | ICD-10-CM

## 2013-10-03 DIAGNOSIS — C50212 Malignant neoplasm of upper-inner quadrant of left female breast: Secondary | ICD-10-CM

## 2013-10-03 MED ORDER — PROCHLORPERAZINE MALEATE 10 MG PO TABS: 10.0000 mg | ORAL_TABLET | Freq: Four times a day (QID) | ORAL | Status: DC | PRN

## 2013-10-03 MED ORDER — LIDOCAINE-PRILOCAINE 2.5-2.5 % EX CREA: 1.0000 "application " | TOPICAL_CREAM | CUTANEOUS | Status: DC | PRN

## 2013-10-03 MED ORDER — ONDANSETRON HCL 8 MG PO TABS: 8.0000 mg | ORAL_TABLET | Freq: Two times a day (BID) | ORAL | Status: DC | PRN

## 2013-10-03 MED ORDER — DEXAMETHASONE 4 MG PO TABS: ORAL_TABLET | ORAL | Status: DC

## 2013-10-03 MED ORDER — LORAZEPAM 0.5 MG PO TABS: 0.5000 mg | ORAL_TABLET | Freq: Every evening | ORAL | Status: DC | PRN

## 2013-10-03 NOTE — Progress Notes (Signed)
[name] [name]  [phone] [phone]) [phone]     ID: [name] [name] OB: [birth year]  MR#: [id number]  EEF#:[id number]  PCP: [name]., MD GYN:  [name] SU: Excell Seltzer OTHER MD: [name]  CHIEF COMPLAINT: Locally advanced breast cancer TREATMENT: To start chemotherapy July 2015  BREAST CANCER HISTORY: From the original intake note 08/20/2013:  Making noted a change, like a small hard pea in her left axilla. She brought this to Dr. [name] attention and bilateral diagnostic mammography and left ultrasonography at the breast Center for 07/18/2013 showed a small area of asymmetry in the inner left breast measuring perhaps 5 mm which was not palpable by physical exam. In the right breast there was some loosely grouped calcifications spanning up to 0.7 cm. Ultrasound of the left breast confirmed an ill-defined area of shadowing at the 9:00 position measuring up to 1.3 cm. There was also a 4 mm circumscribed hypoechoic mass at the 10:00 position. This is felt to be probably benign. In the left axilla there were 2 lymph nodes with slightly increased cortical thickening, although the fatty hila were maintained. These were not related to the patient's feeling of a "pea like mass" in her left axilla, which had by then pretty much resolved.   On 08/07/2013 the patient underwent left breast needle core biopsy and this showed ([id number]) and invasive lobular carcinoma (E-cadherin negative) estrogen receptor 70% positive with strong staining intensity, progesterone receptor 70% positive and strong staining intensity, with a proliferation marker of 7% and no HER-2 amplification, the signals ratio being 0.94 in the number per cell 1.50.  Bilateral breast MRI 08/15/2013 found an irregular spiculated mass in the left breast measuring approximately 1.5 cm. There was no abnormal enhancement of the pectoralis. There were at least 2 left axillary lymph nodes and showed focal  cortical thickening. There was a single left internal mammary lymph node at the level of the biopsy-proven malignancy which was not pathologic in appearance. The right breast and axilla were benign.  The patient's subsequent history is as detailed below   INTERVAL HISTORY:  [name] [name] returns today accompanied by her husband [name] to discuss results of her definitive surgery. She underwent left lumpectomy and axillary lymph node dissection 09/09/2013. The final pathology ([id number]) showed a 1.6 cm invasive lobular carcinoma, grade 1, involving 9 of the 23 lymph nodes removed. Margins were negative but close at 1 mm. Repeat HER-2 testing was again negative. Since her last visit here the patient also had a port placed, after discussion of the multidisciplinary breast cancer conference confirmed that she will need adjuvant chemotherapy. She has also come to "chemotherapy school". She is here today to finalize plans regarding her treatment.  REVIEW OF SYSTEMS: [name] is doing well with her complex situation. She has set her appointment this coming week to get her hair cut. She is exploring weeks. She also has a job interview next week and she thinks a job she likely will be offered will be easier on her. She has not concealed the fact that she is going to be underactive treatments, and they appear willing to facilitate her taking time off as needed. She is concerned about a possible change in insurance and is looking into that. Otherwise aside from anxiety and forgetfulness (she feels very distracted and is having a little bit of difficulty concentrating) a detailed review of systems today was noncontributory  PAST MEDICAL HISTORY: Past Medical History  Diagnosis Date  . Palpitations 2009  Improved; Secondary to premature ventricular contractions. No problems since-pt states was caring for ailing father, anxiety  . Anxiety disorder     Mild: treated by her gynecologist with selective serotonin reuptake  inhibitor  . Nonspecific abnormal electrocardiogram (ECG) (EKG) 2009  . GERD (gastroesophageal reflux disease)     rolaids  . Hot flashes   . Malignant neoplasm of breast (female), unspecified site   . Family history of malignant neoplasm of breast   . Arthritis     hands and feet  . Wears glasses     PAST SURGICAL HISTORY: Past Surgical History  Procedure Laterality Date  . Thyroidectomy, partial    . Cesarean section    . Endometrial ablation    . Dilation and curettage of uterus    . Laparoscopic assisted vaginal hysterectomy  02/07/2011    Procedure: LAPAROSCOPIC ASSISTED VAGINAL HYSTERECTOMY;  Surgeon: [name];  Location: [facility];  Service: Gynecology;  Laterality: N/A;  . Abdominal hysterectomy  02/07/2011    Procedure: HYSTERECTOMY ABDOMINAL;  Surgeon: [name];  Location: [facility];  Service: Gynecology;  Laterality: N/A;  . Breast lumpectomy with needle localization and axillary lymph node dissection Left 09/09/2013    Procedure: LEFT BREAST LUMPECTOMY WITH NEEDLE LOCALIZATION AND LEFT AXILLARY LYMPH NODE DISSECTION;  Surgeon: [name], MD;  Location: [facility];  Service: General;  Laterality: Left;  . Portacath placement Right 09/29/2013    Procedure: INSERTION PORT-A-CATH;  Surgeon: [name], MD;  Location: [facility];  Service: General;  Laterality: Right;    FAMILY HISTORY Family History  Problem Relation Age of Onset  . Angina Father   . Aortic aneurysm Father   . Dementia Father   . Arrhythmia Maternal Aunt     A fib  . Breast cancer Maternal Aunt 56    currently 99  . Hypertension Other   . Arrhythmia Other     Uncle- a fib  . Arrhythmia Maternal Aunt     A fib  . Breast cancer Maternal Aunt     dx 82s; currently 31s  . Breast cancer Mother     dx 17s. Died at 29  . Colon cancer Maternal Grandmother     dx 21s; died at 60  . Colon cancer Maternal Grandfather     dx 7s; died in 55s  . Cancer  Paternal Uncle     unknown primary in early 93s  . Breast cancer Maternal Aunt     dx 26s; died in 43s  The patient's father died from heart disease in the setting of dementia at the age of 78. The patient's mother died at the age of 55. She had been diagnosed with breast cancer at the age of 15. The patient is a single child. Her mother had 4 sisters, 3 of whom had breast cancer diagnosed at the age of 48, 25, and 47. There is also history of colorectal cancer on the maternal side of the family   GYNECOLOGIC HISTORY:   menarche age 89, first live birth age 81, the patient is [place] P2. She underwent a hysterectomy without salpingo-oophorectomy in 2012. She took birth control pills approximately 3 years remotely, with no significant complications   SOCIAL HISTORY:  The patient works as a Publishing copy. Her husband [name]         . Son [name] lives in [place] where he works as a Public relations account executive. Daughter Darrick Penna this in [place], where she works  as an Engineering geologist in a medical spa. The patient has no grandchildren. She is a Information systems manager.    ADVANCED DIRECTIVES: Not in place   HEALTH MAINTENANCE: History  Substance Use Topics  . Smoking status: Never Smoker   . Smokeless tobacco: Not on file  . Alcohol Use: Yes     Comment: drinks 1-2 beers a week     Colonoscopy:2010   PAP:2012   Bone OQHUTML:4650   Lipid panel:  No Known Allergies  Current Outpatient Prescriptions  Medication Sig Dispense Refill  . calcium carbonate-magnesium hydroxide (ROLAIDS) 334 MG CHEW Chew 1 tablet by mouth 2 (two) times daily as needed. For indigestion       . Cholecalciferol (VITAMIN D) 1000 UNITS capsule Take 1,000 Units by mouth daily.      . diclofenac (VOLTAREN) 75 MG EC tablet Take 75 mg by mouth as needed.      . NON FORMULARY       . oxyCODONE (OXY IR/ROXICODONE) 5 MG immediate release tablet Take 1-2 tablets (5-10 mg total) by mouth every 6 (six) hours as needed.  30 tablet  0   No current  facility-administered medications for this visit.    OBJECTIVE: middle-aged white woman in no acute distress Filed Vitals:   10/03/13 1004  BP: 130/89  Pulse: 94  Temp: 98 F (36.7 C)  Resp: 18     Body mass index is 23.74 kg/(m^2).    ECOG FS:0 - Asymptomatic  Ocular: Sclerae unicteric, pupils round and equal Ear-nose-throat: Oropharynx clear,  teeth in good repair  Lymphatic: No cervical or supraclavicular adenopathy Lungs no rales or rhonchi Heart regular rate and rhythm Abd soft, nontender, positive bowel sounds MSK no focal spinal tenderness, no upper extremity edema Neuro: non-focal, well-oriented, appropriate affect Breasts: The right breast is unremarkable. The left breast is status post lumpectomy. The incision is healing very nicely and the cosmetic result is excellent. There are no skin changes of concern and no palpable masses. The left axilla is benign  LAB RESULTS:  CMP     Component Value Date/Time   NA 138 08/20/2013 1232   K 3.5 08/20/2013 1232   CO2 24 08/20/2013 1232   GLUCOSE 114 08/20/2013 1232   BUN 9.9 08/20/2013 1232   CREATININE 0.8 08/20/2013 1232   CALCIUM 8.7 08/20/2013 1232   PROT 6.9 08/20/2013 1232   ALBUMIN 3.8 08/20/2013 1232   AST 15 08/20/2013 1232   ALT 18 08/20/2013 1232   ALKPHOS 64 08/20/2013 1232   BILITOT 0.49 08/20/2013 1232    I No results found for this basename: SPEP,  UPEP,   kappa and lambda light chains    Lab Results  Component Value Date   WBC 6.6 08/20/2013   NEUTROABS 4.0 08/20/2013   HGB 15.4* 09/29/2013   HCT 39.1 08/20/2013   MCV 93.5 08/20/2013   PLT 274 08/20/2013      Chemistry      Component Value Date/Time   NA 138 08/20/2013 1232   K 3.5 08/20/2013 1232   CO2 24 08/20/2013 1232   BUN 9.9 08/20/2013 1232   CREATININE 0.8 08/20/2013 1232      Component Value Date/Time   CALCIUM 8.7 08/20/2013 1232   ALKPHOS 64 08/20/2013 1232   AST 15 08/20/2013 1232   ALT 18 08/20/2013 1232   BILITOT 0.49 08/20/2013 1232        No results found for this basename: LABCA2    No components found with this basename:  [id number]    No results found for this basename: INR,  in the last 168 hours  Urinalysis No results found for this basename: colorurine,  appearanceur,  labspec,  phurine,  glucoseu,  hgbur,  bilirubinur,  ketonesur,  proteinur,  urobilinogen,  nitrite,  leukocytesur    STUDIES: ------------------------------------------------------------------- Transthoracic Echocardiography  Patient: Vaishnavi, Dalby MR #: [id number] Study Date: 09/30/2013 Gender: F Age: 88 Height: 170.2 cm Weight: 67.7 kg BSA: 1.79 m^2 Pt. Status: Room:  Faylene Million ATTENDING Anaiah Mcmannis, Valli Glance Trinisha Paget, Virgie Dad REFERRING Adabelle Griffiths, Virgie Dad SONOGRAPHER Diamond Nickel PERFORMING [facility], Outpatient  cc:  ------------------------------------------------------------------- LV EF: 55%  ------------------------------------------------------------------- History: PMH: Left breast cancer. Left breast lumpectomy. Pre-op evaluation for chemotherapy - 174.9. Risk factors: Family history of coronary artery disease. Dyslipidemia.  ------------------------------------------------------------------- Study Conclusions  - Left ventricle: The cavity size was normal. Wall thickness was normal. The estimated ejection fraction was 55%. Although no diagnostic regional wall motion abnormality was identified, this possibility cannot be completely excluded on the basis of this study. Features are consistent with a pseudonormal left ventricular filling pattern, with concomitant abnormal relaxation and increased filling pressure (grade 2 diastolic dysfunction). Lateral S&' 11.8 cm/sec. Unable to measure strain due to inadequate images.  Ct Chest W Contrast  09/24/2013   CLINICAL DATA:  Newly diagnosed left breast cancer with nodal metastases  EXAM: CT CHEST WITH CONTRAST  TECHNIQUE: Multidetector CT imaging of  the chest was performed during intravenous contrast administration.  CONTRAST:  6m OMNIPAQUE IOHEXOL 300 MG/ML  SOLN  COMPARISON:  Concurrent PET-CT dated 09/24/2013  FINDINGS: Lungs are clear. No suspicious pulmonary nodules. No pleural effusion or pneumothorax.  Status post left thyroidectomy. Visualized right thyroid is notable for a tiny nodule (series 2/image 30), likely benign.  The heart is normal in size.  No pericardial effusion.  No suspicious mediastinal, hilar, or right axillary lymphadenopathy.  Status post left breast lumpectomy with associated 2.6 x 5.2 cm seroma in the medial left breast (series 2/ image 27) and adjacent surgical clips. Multiple postsurgical clips in the left axilla with associated indwelling surgical drain. No residual mass or lymph node is seen in the surgical beds.  Visualized upper abdomen is unremarkable.  Visualized osseous structures are within normal limits.  IMPRESSION: Status post left breast lumpectomy with associated seroma in the medial left breast.  Status post left axillary lymphadenectomy with indwelling surgical drain.  No findings to suggest metastatic disease in the chest.   Electronically Signed   By: SJulian HyM.D.   On: 09/24/2013 09:13   Nm Pet Image Initial (pi) Skull Base To Thigh  09/24/2013   CLINICAL DATA:  Initial treatment strategy for left breast cancer with nodal metastases. Status post left lumpectomy with left axillary lymph node dissection.  EXAM: NUCLEAR MEDICINE PET SKULL BASE TO THIGH  TECHNIQUE: 8.2 mCi F-18 FDG was injected intravenously. Full-ring PET imaging was performed from the skull base to thigh after the radiotracer. CT data was obtained and used for attenuation correction and anatomic localization.  FASTING BLOOD GLUCOSE:  Value: 95 mg/dl  COMPARISON:  Concurrent CT chest dated 09/24/2013  FINDINGS: NECK  No hypermetabolic lymph nodes in the neck.  Status post left thyroidectomy.  CHEST  No suspicious pulmonary nodules on  the CT scan.  Status post left lumpectomy with 2.5 x 5.4 cm postoperative seroma in the medial left breast (series 4/ image 80). Adjacent surgical clips. Very mild residual hypermetabolism inferiorly (PET image 89), favored to  be post surgical.  No hypermetabolic mediastinal or hilar nodes.  Status post left axillary lymph node dissection. Associated surgical clips with indwelling surgical drain. Associated focal hypermetabolism with max SUV 4.2 (PET image 70), although without underlying node on CT, favored to be postsurgical.  ABDOMEN/PELVIS  No abnormal hypermetabolic activity within the liver, pancreas, adrenal glands, or spleen.  2 mm nonobstructing right lower pole renal calculus (series 4/image 127). Status post hysterectomy.  No hypermetabolic lymph nodes in the abdomen or pelvis.  SKELETON  No focal hypermetabolic activity to suggest skeletal metastasis.  IMPRESSION: Status post left lumpectomy with postoperative seroma.  Status post left axillary lymph node dissection with indwelling surgical drain. Associated mild hypermetabolism, favored to be postsurgical.  No evidence of distant metastases.   Electronically Signed   By: Julian Hy M.D.   On: 09/24/2013 09:24   Mm Breast Surgical Specimen  09/09/2013   CLINICAL DATA:  Surgical excision of left breast invasive mammary carcinoma.  EXAM: SPECIMEN RADIOGRAPH OF THE left BREAST  COMPARISON:  Previous exam(s)  FINDINGS: Status post excision of the mass located at the 9 o'clock position within the left breast. The wire, mass, and biopsy marker clip are present in the specimen. The mass and clip are marked for pathology.  IMPRESSION: Specimen radiograph of the left breast.   Electronically Signed   By: Luberta Robertson M.D.   On: 09/09/2013 10:59   Dg Chest Portable 1 View  09/29/2013   CLINICAL DATA:  Status post Port-A-Cath insertion.  EXAM: PORTABLE CHEST - 1 VIEW  COMPARISON:  CT 09/24/2013.  FINDINGS: A right-sided Port-A-Cath from an internal  jugular approach. This terminates at the mid SVC.  Left axillary node dissection. Left lumpectomy. Patient rotated minimally left. Midline trachea. Normal heart size. No pleural effusion or pneumothorax. Clear lungs.  IMPRESSION: Right-sided Port-A-Cath terminating at mid SVC, without pneumothorax or other acute finding.   Electronically Signed   By: Abigail Miyamoto M.D.   On: 09/29/2013 15:07   Dg Fluoro Guide Cv Line-no Report  09/29/2013   CLINICAL DATA: port placement   FLOURO GUIDE CV LINE  Fluoroscopy was utilized by the requesting physician.  No radiographic  interpretation.    [place] Lt Plc Breast Loc Dev   1st Lesion  Inc US Guide  09/09/2013   CLINICAL DATA:  Known left breast invasive mammary carcinoma. Preoperative needle localization.  EXAM: NEEDLE LOCALIZATION OF THE left BREAST WITH ULTRASOUND GUIDANCE  COMPARISON:  Previous exams.  FINDINGS: Patient presents for needle localization prior to surgical excision of left breast mass located at the 9 o'clock position. I met with the patient and we discussed the procedure of needle localization including benefits and alternatives. We discussed the high likelihood of a successful procedure. We discussed the risks of the procedure, including infection, bleeding, tissue injury, and further surgery. Informed, written consent was given. The usual time-out protocol was performed immediately prior to the procedure.  Using ultrasound guidance, sterile technique, 2% lidocaine and a 7 cm Ultra wire, the left breast mass with clip was localized using a medial/inferior approach. The films were marked for Dr. Excell Seltzer.  IMPRESSION: Needle localization of the mass located at the 9 o'clock position within the left breast. No apparent complications.   Electronically Signed   By: Luberta Robertson M.D.   On: 09/09/2013 10:55   ASSESSMENT: 53 y.o. [place] woman status post left breast biopsy for 01/18/2014 showing invasive lobular carcinoma (E-cadherin negative), estrogen  receptor 70% positive, progesterone receptor  70% positive, with an MIB-1 of 7%, and no HER-2 amplification  (1) ultrasound-guided biopsy of a suspicious lymph node in the left axilla 08/19/2012 was positive  (2) genetics testing may 2015 was normal and did not reveal a mutation in the genes tested: ATM, BARD1, BRCA1, BRCA2, BRIP1, CDH1, CHEK2, MRE11A, MUTYH, NBN, NF1, PALB2, PTEN, RAD50, RAD51C, RAD51D, and TP53.  (3) status post left lumpectomy and axillary lymph node dissection 09/09/2013 for a pT1c pN2, stage IIIa invasive lobular carcinoma, grade 1, repeat HER-2 again negative  (4) adjuvant chemotherapy will consist of doxorubicin and cyclophosphamide in dose dense fashion x4, with Neulasta support, followed by paclitaxel in dose dense fashion x4, again with Neulasta support  (5) radiation to follow chemotherapy  (6) antiestrogen therapy to follow radiation   PLAN: I spent approximately 50 minutes today with Mitzy and her husband going over her situation. She turned out to have a total of 6 positive lymph nodes and therefore will not qualify for this walks study. We have canceled the Oncotype, which is the relevant at this point, and the plan is to proceed with adjuvant chemotherapy, which will consist of cyclophosphamide and doxorubicin in dose dense fashion x4, followed most likely by paclitaxel x4 in the same schedule, although she will have the option of switching to weekly paclitaxel x12, with equivalent results.  We discussed the possible toxicities, side effects and complications of treatment, which is scheduled to start right after the July 4 holidays. I also entered the appropriate prescriptions and gave the patient a "map" on how to take her antinausea and other supportive medications.  She would like to start her chemotherapy here, but receive Neulasta at our [facility] office. She may wish to switch all treatments to the [facility] office, but she also wishes to keep me as her  primary oncologist and of course we have no problems with that.  The patient has a good understanding of the overall plan. She agrees with it. She knows the goal of treatment in her case is cure. She will call with any problems that may develop before her next visit here.  Chauncey Cruel, MD   10/03/2013 10:28 AM

## 2013-10-03 NOTE — Telephone Encounter (Signed)
, °

## 2013-10-03 NOTE — Telephone Encounter (Signed)
Per Dr. Jana Hakim, I informed patient that the PET scan was fine. This message was left on patient's cell phone. Instructed patient to call the office if she had any questions.

## 2013-10-03 NOTE — Telephone Encounter (Signed)
Per staff message and POF I have scheduled appts.  JMW  

## 2013-10-04 NOTE — Addendum Note (Signed)
Addended by: Lurline Del C on: 10/04/2013 11:20 AM   Modules accepted: Orders, SmartSet

## 2013-10-06 ENCOUNTER — Telehealth: Payer: Self-pay | Admitting: *Deleted

## 2013-10-06 ENCOUNTER — Telehealth: Payer: Self-pay | Admitting: Oncology

## 2013-10-06 NOTE — Telephone Encounter (Signed)
, °

## 2013-10-06 NOTE — Telephone Encounter (Signed)
Received vm call from pt stating that she discussed with Dr Jana Hakim giving herself neulasta inj & she checked with her pharmacy & was told that this would be OK.  Informed pt that I would discuss with our managed care dept to check on this for her due to cost of med. Message left with Eliz/Managed Care.  She also had a question about nausea meds that Dr Jana Hakim was to send in & pharmacy had not received yet.  She uses [facility] in [place].  The scripts were sent to [facility].  The pt will have scripts transferred to [facility].

## 2013-10-20 ENCOUNTER — Encounter (INDEPENDENT_AMBULATORY_CARE_PROVIDER_SITE_OTHER): Payer: Self-pay | Admitting: General Surgery

## 2013-10-20 ENCOUNTER — Encounter: Payer: Self-pay | Admitting: Adult Health

## 2013-10-20 DIAGNOSIS — I890 Lymphedema, not elsewhere classified: Secondary | ICD-10-CM

## 2013-10-20 DIAGNOSIS — C50919 Malignant neoplasm of unspecified site of unspecified female breast: Secondary | ICD-10-CM

## 2013-10-21 NOTE — Telephone Encounter (Signed)
Called and left message for patient to make aware that Dr. Excell Seltzer would like for patient to be seen and evaluated by Physical Therapy for Lymphedema.  Referral to Physical Therapy for Lymphedema.  Order has been placed in EPIC.  Patient should expect a call with consultation appointment.

## 2013-11-02 ENCOUNTER — Encounter: Payer: Self-pay | Admitting: Oncology

## 2013-11-04 ENCOUNTER — Telehealth: Payer: Self-pay | Admitting: Oncology

## 2013-11-04 NOTE — Telephone Encounter (Signed)
pt called today to schedule inj for 7/10. per pt she talked w/GM re doing inj at home but that will not work out and she will need to have inj here. pt given appt for 7/10. inj in care plan - see last pof from 6/6.

## 2013-11-06 ENCOUNTER — Encounter: Payer: Self-pay | Admitting: Adult Health

## 2013-11-06 ENCOUNTER — Ambulatory Visit (HOSPITAL_BASED_OUTPATIENT_CLINIC_OR_DEPARTMENT_OTHER): Payer: BC Managed Care – PPO

## 2013-11-06 ENCOUNTER — Ambulatory Visit (HOSPITAL_BASED_OUTPATIENT_CLINIC_OR_DEPARTMENT_OTHER): Payer: BC Managed Care – PPO | Admitting: Adult Health

## 2013-11-06 ENCOUNTER — Other Ambulatory Visit (HOSPITAL_BASED_OUTPATIENT_CLINIC_OR_DEPARTMENT_OTHER): Payer: BC Managed Care – PPO

## 2013-11-06 VITALS — BP 120/81 | HR 87 | Temp 98.0°F | Resp 18 | Ht 67.0 in | Wt 150.7 lb

## 2013-11-06 DIAGNOSIS — C50919 Malignant neoplasm of unspecified site of unspecified female breast: Secondary | ICD-10-CM

## 2013-11-06 DIAGNOSIS — Z5111 Encounter for antineoplastic chemotherapy: Secondary | ICD-10-CM

## 2013-11-06 DIAGNOSIS — C50212 Malignant neoplasm of upper-inner quadrant of left female breast: Secondary | ICD-10-CM

## 2013-11-06 DIAGNOSIS — C773 Secondary and unspecified malignant neoplasm of axilla and upper limb lymph nodes: Secondary | ICD-10-CM

## 2013-11-06 DIAGNOSIS — Z170 Estrogen receptor positive status [ER+]: Secondary | ICD-10-CM

## 2013-11-06 DIAGNOSIS — F411 Generalized anxiety disorder: Secondary | ICD-10-CM

## 2013-11-06 LAB — CBC WITH DIFFERENTIAL/PLATELET
BASO%: 0.5 % (ref 0.0–2.0)
Basophils Absolute: 0 10*3/uL (ref 0.0–0.1)
EOS ABS: 0.3 10*3/uL (ref 0.0–0.5)
EOS%: 4.7 % (ref 0.0–7.0)
HEMATOCRIT: 41.5 % (ref 34.8–46.6)
HGB: 14.1 g/dL (ref 11.6–15.9)
LYMPH#: 1.7 10*3/uL (ref 0.9–3.3)
LYMPH%: 28.2 % (ref 14.0–49.7)
MCH: 31.8 pg (ref 25.1–34.0)
MCHC: 34.1 g/dL (ref 31.5–36.0)
MCV: 93.4 fL (ref 79.5–101.0)
MONO#: 0.4 10*3/uL (ref 0.1–0.9)
MONO%: 7.1 % (ref 0.0–14.0)
NEUT%: 59.5 % (ref 38.4–76.8)
NEUTROS ABS: 3.6 10*3/uL (ref 1.5–6.5)
Platelets: 258 10*3/uL (ref 145–400)
RBC: 4.44 10*6/uL (ref 3.70–5.45)
RDW: 12.6 % (ref 11.2–14.5)
WBC: 6.1 10*3/uL (ref 3.9–10.3)

## 2013-11-06 LAB — COMPREHENSIVE METABOLIC PANEL (CC13)
ALT: 24 U/L (ref 0–55)
ANION GAP: 10 meq/L (ref 3–11)
AST: 15 U/L (ref 5–34)
Albumin: 4.1 g/dL (ref 3.5–5.0)
Alkaline Phosphatase: 61 U/L (ref 40–150)
BILIRUBIN TOTAL: 0.78 mg/dL (ref 0.20–1.20)
BUN: 17.7 mg/dL (ref 7.0–26.0)
CO2: 28 meq/L (ref 22–29)
Calcium: 9.7 mg/dL (ref 8.4–10.4)
Chloride: 105 mEq/L (ref 98–109)
Creatinine: 0.9 mg/dL (ref 0.6–1.1)
GLUCOSE: 85 mg/dL (ref 70–140)
Potassium: 4.5 mEq/L (ref 3.5–5.1)
SODIUM: 143 meq/L (ref 136–145)
Total Protein: 7.4 g/dL (ref 6.4–8.3)

## 2013-11-06 MED ORDER — SODIUM CHLORIDE 0.9 % IV SOLN
150.0000 mg | Freq: Once | INTRAVENOUS | Status: AC
  Administered 2013-11-06: 150 mg via INTRAVENOUS
  Filled 2013-11-06: qty 5

## 2013-11-06 MED ORDER — DEXAMETHASONE SODIUM PHOSPHATE 20 MG/5ML IJ SOLN
12.0000 mg | Freq: Once | INTRAMUSCULAR | Status: AC
  Administered 2013-11-06: 12 mg via INTRAVENOUS

## 2013-11-06 MED ORDER — PALONOSETRON HCL INJECTION 0.25 MG/5ML
0.2500 mg | Freq: Once | INTRAVENOUS | Status: AC
  Administered 2013-11-06: 0.25 mg via INTRAVENOUS

## 2013-11-06 MED ORDER — HEPARIN SOD (PORK) LOCK FLUSH 100 UNIT/ML IV SOLN
500.0000 [IU] | Freq: Once | INTRAVENOUS | Status: AC | PRN
  Administered 2013-11-06: 500 [IU]
  Filled 2013-11-06: qty 5

## 2013-11-06 MED ORDER — PALONOSETRON HCL INJECTION 0.25 MG/5ML
INTRAVENOUS | Status: AC
  Filled 2013-11-06: qty 5

## 2013-11-06 MED ORDER — DEXAMETHASONE SODIUM PHOSPHATE 20 MG/5ML IJ SOLN
INTRAMUSCULAR | Status: AC
  Filled 2013-11-06: qty 5

## 2013-11-06 MED ORDER — SODIUM CHLORIDE 0.9 % IV SOLN
600.0000 mg/m2 | Freq: Once | INTRAVENOUS | Status: AC
  Administered 2013-11-06: 1080 mg via INTRAVENOUS
  Filled 2013-11-06: qty 54

## 2013-11-06 MED ORDER — SODIUM CHLORIDE 0.9 % IV SOLN
Freq: Once | INTRAVENOUS | Status: AC
  Administered 2013-11-06: 11:00:00 via INTRAVENOUS

## 2013-11-06 MED ORDER — DOXORUBICIN HCL CHEMO IV INJECTION 2 MG/ML
60.0000 mg/m2 | Freq: Once | INTRAVENOUS | Status: AC
  Administered 2013-11-06: 108 mg via INTRAVENOUS
  Filled 2013-11-06: qty 54

## 2013-11-06 MED ORDER — SODIUM CHLORIDE 0.9 % IJ SOLN
10.0000 mL | INTRAMUSCULAR | Status: DC | PRN
  Administered 2013-11-06: 10 mL
  Filled 2013-11-06: qty 10

## 2013-11-06 NOTE — Progress Notes (Signed)
[place]  [phone] [phone]     ID: [name] [name] OB: [birth year]  MR#: [id number]  XTK#:[id number]  PCP: [name]., MD GYN:  [name] SU: Excell Seltzer OTHER MD: [name]  CHIEF COMPLAINT: Locally advanced breast cancer TREATMENT: To start chemotherapy July 2015  BREAST CANCER HISTORY: From the original intake note 08/20/2013:  Making noted a change, like a small hard pea in her left axilla. She brought this to Dr. [name] attention and bilateral diagnostic mammography and left ultrasonography at the breast Center for 07/18/2013 showed a small area of asymmetry in the inner left breast measuring perhaps 5 mm which was not palpable by physical exam. In the right breast there was some loosely grouped calcifications spanning up to 0.7 cm. Ultrasound of the left breast confirmed an ill-defined area of shadowing at the 9:00 position measuring up to 1.3 cm. There was also a 4 mm circumscribed hypoechoic mass at the 10:00 position. This is felt to be probably benign. In the left axilla there were 2 lymph nodes with slightly increased cortical thickening, although the fatty hila were maintained. These were not related to the patient's feeling of a "pea like mass" in her left axilla, which had by then pretty much resolved.   On 08/07/2013 the patient underwent left breast needle core biopsy and this showed ([id number]) and invasive lobular carcinoma (E-cadherin negative) estrogen receptor 70% positive with strong staining intensity, progesterone receptor 70% positive and strong staining intensity, with a proliferation marker of 7% and no HER-2 amplification, the signals ratio being 0.94 in the number per cell 1.50.  Bilateral breast MRI 08/15/2013 found an irregular spiculated mass in the left breast measuring approximately 1.5 cm. There was no abnormal enhancement of the pectoralis. There were at least 2 left axillary lymph nodes and showed focal  cortical thickening. There was a single left internal mammary lymph node at the level of the biopsy-proven malignancy which was not pathologic in appearance. The right breast and axilla were benign.  The patient's subsequent history is as detailed below   INTERVAL HISTORY: [name] [name] is here today prior to initiating Doxorubicin and Cyclophosphamide.  She will receive this treatment on day 1 of a 14 day cycle with Neulasta given on day 2 for granulocyte support.  A total of 4 cycles are planned.  She is currently cycle 1 day 1.  She has decided to receive her [place] [facility] here rather than at [facility].    She is doing well today and is ready to proceed with her treatments.  She does note a small thread like feeling in her neck since her port was placed.  She is experiencing hot flashes that began a few years ago, and have waxed and waned and in the past few days have become more frequent.  She is concerned that the chemotherapy drugs can make them increase.  She does occasionally have mild constipation, and she typically waits, and here recently she has been increasing her fruit and water intake.  She does have a stool softener and miralax on hand if needed.  Otherwise, she denies fevers, chills, nausea, vomiting, diarrhea, numbness, or any further concerns.   REVIEW OF SYSTEMS: A 10 point review of systems was conducted and is otherwise negative except for what is noted above.     PAST MEDICAL HISTORY: Past Medical History  Diagnosis Date  . Palpitations 2009    Improved; Secondary to premature ventricular contractions. No problems since-pt states was caring  for ailing father, anxiety  . Anxiety disorder     Mild: treated by her gynecologist with selective serotonin reuptake inhibitor  . Nonspecific abnormal electrocardiogram (ECG) (EKG) 2009  . GERD (gastroesophageal reflux disease)     rolaids  . Hot flashes   . Malignant neoplasm of breast (female), unspecified site   . Family history of  malignant neoplasm of breast   . Arthritis     hands and feet  . Wears glasses     PAST SURGICAL HISTORY: Past Surgical History  Procedure Laterality Date  . Thyroidectomy, partial    . Cesarean section    . Endometrial ablation    . Dilation and curettage of uterus    . Laparoscopic assisted vaginal hysterectomy  02/07/2011    Procedure: LAPAROSCOPIC ASSISTED VAGINAL HYSTERECTOMY;  Surgeon: [name];  Location: [facility];  Service: Gynecology;  Laterality: N/A;  . Abdominal hysterectomy  02/07/2011    Procedure: HYSTERECTOMY ABDOMINAL;  Surgeon: [name];  Location: [facility];  Service: Gynecology;  Laterality: N/A;  . Breast lumpectomy with needle localization and axillary lymph node dissection Left 09/09/2013    Procedure: LEFT BREAST LUMPECTOMY WITH NEEDLE LOCALIZATION AND LEFT AXILLARY LYMPH NODE DISSECTION;  Surgeon: [name], MD;  Location: [facility];  Service: General;  Laterality: Left;  . Portacath placement Right 09/29/2013    Procedure: INSERTION PORT-A-CATH;  Surgeon: [name], MD;  Location: [facility];  Service: General;  Laterality: Right;    FAMILY HISTORY Family History  Problem Relation Age of Onset  . Angina Father   . Aortic aneurysm Father   . Dementia Father   . Arrhythmia Maternal Aunt     A fib  . Breast cancer Maternal Aunt 56    currently 74  . Hypertension Other   . Arrhythmia Other     Uncle- a fib  . Arrhythmia Maternal Aunt     A fib  . Breast cancer Maternal Aunt     dx 85s; currently 57s  . Breast cancer Mother     dx 83s. Died at 65  . Colon cancer Maternal Grandmother     dx 79s; died at 24  . Colon cancer Maternal Grandfather     dx 61s; died in 71s  . Cancer Paternal Uncle     unknown primary in early 52s  . Breast cancer Maternal Aunt     dx 26s; died in 72s  The patient's father died from heart disease in the setting of dementia at the age of 76. The patient's mother died  at the age of 75. She had been diagnosed with breast cancer at the age of 66. The patient is a single child. Her mother had 4 sisters, 3 of whom had breast cancer diagnosed at the age of 44, 34, and 44. There is also history of colorectal cancer on the maternal side of the family   GYNECOLOGIC HISTORY:   menarche age 29, first live birth age 22, the patient is [place] P2. She underwent a hysterectomy without salpingo-oophorectomy in 2012. She took birth control pills approximately 3 years remotely, with no significant complications   SOCIAL HISTORY:  The patient works as a Publishing copy. Her husband [name] [name]         . Son [name] [name] lives in [place] where he works as a Public relations account executive. Daughter [name] [name] this in [place], where she works as an Engineering geologist in a [employer]. The patient has no grandchildren.  She is a Information systems manager.    ADVANCED DIRECTIVES: Not in place   HEALTH MAINTENANCE: History  Substance Use Topics  . Smoking status: Never Smoker   . Smokeless tobacco: Not on file  . Alcohol Use: Yes     Comment: drinks 1-2 beers a week     Colonoscopy:2010   PAP:2012   Bone UKGURKY:[id number]   Lipid panel:  No Known Allergies  Current Outpatient Prescriptions  Medication Sig Dispense Refill  . calcium carbonate-magnesium hydroxide (ROLAIDS) 334 MG CHEW Chew 1 tablet by mouth 2 (two) times daily as needed. For indigestion       . Cholecalciferol (VITAMIN D) 2000 UNITS tablet Take 2,000 Units by mouth daily.      Marland Kitchen dexamethasone (DECADRON) 4 MG tablet Take 2 tablets by mouth once a day on the day after chemotherapy and then take 2 tablets two times a day for 2 days. Take with food.  30 tablet  1  . lidocaine-prilocaine (EMLA) cream Apply 1 application topically as needed. Apply over port 1-2 hours before chemo, cover with plastic wrap  30 g  0  . NON FORMULARY       . diclofenac (VOLTAREN) 75 MG EC tablet Take 75 mg by mouth as needed.      Marland Kitchen LORazepam (ATIVAN) 0.5 MG tablet Take 1 tablet (0.5 mg total)  by mouth at bedtime as needed (Nausea or vomiting).  30 tablet  0  . ondansetron (ZOFRAN) 8 MG tablet Take 1 tablet (8 mg total) by mouth 2 (two) times daily as needed. Start on the third day after chemotherapy.  30 tablet  1  . oxyCODONE (OXY IR/ROXICODONE) 5 MG immediate release tablet Take 1-2 tablets (5-10 mg total) by mouth every 6 (six) hours as needed.  30 tablet  0  . prochlorperazine (COMPAZINE) 10 MG tablet Take 1 tablet (10 mg total) by mouth every 6 (six) hours as needed (Nausea or vomiting).  30 tablet  1   No current facility-administered medications for this visit.    OBJECTIVE: middle-aged white woman in no acute distress Filed Vitals:   11/06/13 0838  BP: 120/81  Pulse: 87  Temp: 98 F (36.7 C)  Resp: 18     Body mass index is 23.6 kg/(m^2).    ECOG FS:0 - Asymptomatic GENERAL: Patient is a well appearing female in no acute distress HEENT:  Sclerae anicteric.  Oropharynx clear and moist. No ulcerations or evidence of oropharyngeal candidiasis. Neck is supple.  NODES:  No cervical, supraclavicular, or axillary lymphadenopathy palpated.  BREAST EXAM:  Deferred. LUNGS:  Clear to auscultation bilaterally.  No wheezes or rhonchi. HEART:  Regular rate and rhythm. No murmur appreciated. ABDOMEN:  Soft, nontender.  Positive, normoactive bowel sounds. No organomegaly palpated. MSK:  No focal spinal tenderness to palpation. Full range of motion bilaterally in the upper extremities. EXTREMITIES:  No peripheral edema.   SKIN:  Clear with no obvious rashes or skin changes. No nail dyscrasia.  Port in right chest wall is intact, small mm suture poking through skin NEURO:  Nonfocal. Well oriented.  Appropriate affect.    LAB RESULTS:  CMP     Component Value Date/Time   NA 143 11/06/2013 0818   K 4.5 11/06/2013 0818   CO2 28 11/06/2013 0818   GLUCOSE 85 11/06/2013 0818   BUN 17.7 11/06/2013 0818   CREATININE 0.9 11/06/2013 0818   CALCIUM 9.7 11/06/2013 0818   PROT 7.4 11/06/2013 0818    ALBUMIN 4.1 11/06/2013  0818   AST 15 11/06/2013 0818   ALT 24 11/06/2013 0818   ALKPHOS 61 11/06/2013 0818   BILITOT 0.78 11/06/2013 0818    I No results found for this basename: SPEP,  UPEP,   kappa and lambda light chains    Lab Results  Component Value Date   WBC 6.1 11/06/2013   NEUTROABS 3.6 11/06/2013   HGB 14.1 11/06/2013   HCT 41.5 11/06/2013   MCV 93.4 11/06/2013   PLT 258 11/06/2013      Chemistry      Component Value Date/Time   NA 143 11/06/2013 0818   K 4.5 11/06/2013 0818   CO2 28 11/06/2013 0818   BUN 17.7 11/06/2013 0818   CREATININE 0.9 11/06/2013 0818      Component Value Date/Time   CALCIUM 9.7 11/06/2013 0818   ALKPHOS 61 11/06/2013 0818   AST 15 11/06/2013 0818   ALT 24 11/06/2013 0818   BILITOT 0.78 11/06/2013 0818       No results found for this basename: LABCA2    No components found with this basename: LABCA125    No results found for this basename: INR,  in the last 168 hours  Urinalysis No results found for this basename: colorurine,  appearanceur,  labspec,  phurine,  glucoseu,  hgbur,  bilirubinur,  ketonesur,  proteinur,  urobilinogen,  nitrite,  leukocytesur    STUDIES: ------------------------------------------------------------------- Transthoracic Echocardiography  Patient: [name] [name], [name] [name] MR #: [id number] Study Date: 09/30/2013 Gender: F Age: 2 Height: 170.2 cm Weight: 67.7 kg BSA: 1.79 m^2 Pt. Status: Room:  Faylene Million ATTENDING Magrinat, Valli Glance Magrinat, Virgie Dad REFERRING Magrinat, Virgie Dad SONOGRAPHER Diamond Nickel PERFORMING [facility], Outpatient  cc:  ------------------------------------------------------------------- LV EF: 55%  ------------------------------------------------------------------- History: PMH: Left breast cancer. Left breast lumpectomy. Pre-op evaluation for chemotherapy - 174.9. Risk factors: Family history of coronary artery disease.  Dyslipidemia.  ------------------------------------------------------------------- Study Conclusions  - Left ventricle: The cavity size was normal. Wall thickness was normal. The estimated ejection fraction was 55%. Although no diagnostic regional wall motion abnormality was identified, this possibility cannot be completely excluded on the basis of this study. Features are consistent with a pseudonormal left ventricular filling pattern, with concomitant abnormal relaxation and increased filling pressure (grade 2 diastolic dysfunction). Lateral S&' 11.8 cm/sec. Unable to measure strain due to inadequate images.  Ct Chest W Contrast  09/24/2013   CLINICAL DATA:  Newly diagnosed left breast cancer with nodal metastases  EXAM: CT CHEST WITH CONTRAST  TECHNIQUE: Multidetector CT imaging of the chest was performed during intravenous contrast administration.  CONTRAST:  78m OMNIPAQUE IOHEXOL 300 MG/ML  SOLN  COMPARISON:  Concurrent PET-CT dated 09/24/2013  FINDINGS: Lungs are clear. No suspicious pulmonary nodules. No pleural effusion or pneumothorax.  Status post left thyroidectomy. Visualized right thyroid is notable for a tiny nodule (series 2/image 30), likely benign.  The heart is normal in size.  No pericardial effusion.  No suspicious mediastinal, hilar, or right axillary lymphadenopathy.  Status post left breast lumpectomy with associated 2.6 x 5.2 cm seroma in the medial left breast (series 2/ image 27) and adjacent surgical clips. Multiple postsurgical clips in the left axilla with associated indwelling surgical drain. No residual mass or lymph node is seen in the surgical beds.  Visualized upper abdomen is unremarkable.  Visualized osseous structures are within normal limits.  IMPRESSION: Status post left breast lumpectomy with associated seroma in the medial left breast.  Status post left axillary lymphadenectomy with indwelling  surgical drain.  No findings to suggest metastatic disease in the  chest.   Electronically Signed   By: Julian Hy M.D.   On: 09/24/2013 09:13   Nm Pet Image Initial (pi) Skull Base To Thigh  09/24/2013   CLINICAL DATA:  Initial treatment strategy for left breast cancer with nodal metastases. Status post left lumpectomy with left axillary lymph node dissection.  EXAM: NUCLEAR MEDICINE PET SKULL BASE TO THIGH  TECHNIQUE: 8.2 mCi F-18 FDG was injected intravenously. Full-ring PET imaging was performed from the skull base to thigh after the radiotracer. CT data was obtained and used for attenuation correction and anatomic localization.  FASTING BLOOD GLUCOSE:  Value: 95 mg/dl  COMPARISON:  Concurrent CT chest dated 09/24/2013  FINDINGS: NECK  No hypermetabolic lymph nodes in the neck.  Status post left thyroidectomy.  CHEST  No suspicious pulmonary nodules on the CT scan.  Status post left lumpectomy with 2.5 x 5.4 cm postoperative seroma in the medial left breast (series 4/ image 80). Adjacent surgical clips. Very mild residual hypermetabolism inferiorly (PET image 89), favored to be post surgical.  No hypermetabolic mediastinal or hilar nodes.  Status post left axillary lymph node dissection. Associated surgical clips with indwelling surgical drain. Associated focal hypermetabolism with max SUV 4.2 (PET image 70), although without underlying node on CT, favored to be postsurgical.  ABDOMEN/PELVIS  No abnormal hypermetabolic activity within the liver, pancreas, adrenal glands, or spleen.  2 mm nonobstructing right lower pole renal calculus (series 4/image 127). Status post hysterectomy.  No hypermetabolic lymph nodes in the abdomen or pelvis.  SKELETON  No focal hypermetabolic activity to suggest skeletal metastasis.  IMPRESSION: Status post left lumpectomy with postoperative seroma.  Status post left axillary lymph node dissection with indwelling surgical drain. Associated mild hypermetabolism, favored to be postsurgical.  No evidence of distant metastases.    Electronically Signed   By: Julian Hy M.D.   On: 09/24/2013 09:24   Mm Breast Surgical Specimen  09/09/2013   CLINICAL DATA:  Surgical excision of left breast invasive mammary carcinoma.  EXAM: SPECIMEN RADIOGRAPH OF THE left BREAST  COMPARISON:  Previous exam(s)  FINDINGS: Status post excision of the mass located at the 9 o'clock position within the left breast. The wire, mass, and biopsy marker clip are present in the specimen. The mass and clip are marked for pathology.  IMPRESSION: Specimen radiograph of the left breast.   Electronically Signed   By: Luberta Robertson M.D.   On: 09/09/2013 10:59   Dg Chest Portable 1 View  09/29/2013   CLINICAL DATA:  Status post Port-A-Cath insertion.  EXAM: PORTABLE CHEST - 1 VIEW  COMPARISON:  CT 09/24/2013.  FINDINGS: A right-sided Port-A-Cath from an internal jugular approach. This terminates at the mid SVC.  Left axillary node dissection. Left lumpectomy. Patient rotated minimally left. Midline trachea. Normal heart size. No pleural effusion or pneumothorax. Clear lungs.  IMPRESSION: Right-sided Port-A-Cath terminating at mid SVC, without pneumothorax or other acute finding.   Electronically Signed   By: Abigail Miyamoto M.D.   On: 09/29/2013 15:07   Dg Fluoro Guide Cv Line-no Report  09/29/2013   CLINICAL DATA: port placement   FLOURO GUIDE CV LINE  Fluoroscopy was utilized by the requesting physician.  No radiographic  interpretation.    [place] [facility] Breast Loc Dev   1st Lesion  Inc US Guide  09/09/2013   CLINICAL DATA:  Known left breast invasive mammary carcinoma. Preoperative needle localization.  EXAM:  NEEDLE LOCALIZATION OF THE left BREAST WITH ULTRASOUND GUIDANCE  COMPARISON:  Previous exams.  FINDINGS: Patient presents for needle localization prior to surgical excision of left breast mass located at the 9 o'clock position. I met with the patient and we discussed the procedure of needle localization including benefits and alternatives. We discussed the high  likelihood of a successful procedure. We discussed the risks of the procedure, including infection, bleeding, tissue injury, and further surgery. Informed, written consent was given. The usual time-out protocol was performed immediately prior to the procedure.  Using ultrasound guidance, sterile technique, 2% lidocaine and a 7 cm Ultra wire, the left breast mass with clip was localized using a medial/inferior approach. The films were marked for Dr. Excell Seltzer.  IMPRESSION: Needle localization of the mass located at the 9 o'clock position within the left breast. No apparent complications.   Electronically Signed   By: Luberta Robertson M.D.   On: 09/09/2013 10:55   ASSESSMENT: 53 y.o. [place] woman status post left breast biopsy for 01/18/2014 showing invasive lobular carcinoma (E-cadherin negative), estrogen receptor 70% positive, progesterone receptor 70% positive, with an MIB-1 of 7%, and no HER-2 amplification  (1) ultrasound-guided biopsy of a suspicious lymph node in the left axilla 08/19/2012 was positive  (2) genetics testing may 2015 was normal and did not reveal a mutation in the genes tested: ATM, BARD1, BRCA1, BRCA2, BRIP1, CDH1, CHEK2, MRE11A, MUTYH, NBN, NF1, PALB2, PTEN, RAD50, RAD51C, RAD51D, and TP53.  (3) status post left lumpectomy and axillary lymph node dissection 09/09/2013 for a pT1c pN2, stage IIIa invasive lobular carcinoma, grade 1, repeat HER-2 again negative  (4) adjuvant chemotherapy will consist of doxorubicin and cyclophosphamide in dose dense fashion x4, with Neulasta support, followed by paclitaxel in dose dense fashion x4, again with Neulasta support  (5) radiation to follow chemotherapy  (6) antiestrogen therapy to follow radiation   PLAN:  Lashawne is doing well today.  Her labs are normal.  I reviewed them with her in detail.  She will proceed with cycle 1 day 1 of Doxorubicin and cyclophosphamide.  She did have an echocardiogram on 09/30/2013 that demonstrated a LVEF  of 55%.  I reviewed her chemotherapy regimen with her.  She understands her anti-emetic regimen and has a handout on how to take these after treatment.  I gave her detailed information on Doxorubicin, Cyclophosphamide, and Neulasta in her AVS.  I instructed her to begin Claritin 23m daily along with Tylenol 5022mor 65078maily for 5 days tomorrow.  I counseled her on sun exposure.  I did recommend she see Dr. HoxExcell Seltzerout her port and the thread feeling on her neck.    Nyela will return tomorrow for Neulasta and in one week for labs and evaluation for any chemotoxicities.    The patient has a good understanding of the overall plan. She agrees with it. She knows the goal of treatment in her case is cure. She will call with any problems that may develop before her next visit here.  I spent 25 minutes counseling the patient face to face.  The total time spent in the appointment was 30 minutes.  LinMinette HeadlandP [id number]/2014 1:39 PM

## 2013-11-06 NOTE — Patient Instructions (Addendum)
Take Claritin 10mg  daily starting tomorrow and Tylenol 500mg  or 650mg  daily starting tomorrow morning for 5 days.     Pegfilgrastim injection What is this medicine? PEGFILGRASTIM (peg fil GRA stim) helps the body make more white blood cells. It is used to prevent infection in people with low amounts of white blood cells following cancer treatment. This medicine may be used for other purposes; ask your health care provider or pharmacist if you have questions. COMMON BRAND NAME(S): Neulasta What should I tell my health care provider before I take this medicine? They need to know if you have any of these conditions: -sickle cell disease -an unusual or allergic reaction to pegfilgrastim, filgrastim, E.coli protein, other medicines, foods, dyes, or preservatives -pregnant or trying to get pregnant -breast-feeding How should I use this medicine? This medicine is for injection under the skin. It is usually given by a health care professional in a hospital or clinic setting. If you get this medicine at home, you will be taught how to prepare and give this medicine. Do not shake this medicine. Use exactly as directed. Take your medicine at regular intervals. Do not take your medicine more often than directed. It is important that you put your used needles and syringes in a special sharps container. Do not put them in a trash can. If you do not have a sharps container, call your pharmacist or healthcare provider to get one. Talk to your pediatrician regarding the use of this medicine in children. While this drug may be prescribed for children who weigh more than 45 kg for selected conditions, precautions do apply Overdosage: If you think you have taken too much of this medicine contact a poison control center or emergency room at once. NOTE: This medicine is only for you. Do not share this medicine with others. What if I miss a dose? If you miss a dose, take it as soon as you can. If it is almost time  for your next dose, take only that dose. Do not take double or extra doses. What may interact with this medicine? -lithium -medicines for growth therapy This list may not describe all possible interactions. Give your health care provider a list of all the medicines, herbs, non-prescription drugs, or dietary supplements you use. Also tell them if you smoke, drink alcohol, or use illegal drugs. Some items may interact with your medicine. What should I watch for while using this medicine? Visit your doctor for regular check ups. You will need important blood work done while you are taking this medicine. What side effects may I notice from receiving this medicine? Side effects that you should report to your doctor or health care professional as soon as possible: -allergic reactions like skin rash, itching or hives, swelling of the face, lips, or tongue -breathing problems -fever -pain, redness, or swelling where injected -shoulder pain -stomach or side pain Side effects that usually do not require medical attention (report to your doctor or health care professional if they continue or are bothersome): -aches, pains -headache -loss of appetite -nausea, vomiting -unusually tired This list may not describe all possible side effects. Call your doctor for medical advice about side effects. You may report side effects to FDA at 1-800-FDA-1088. Where should I keep my medicine? Keep out of the reach of children. Store in a refrigerator between 2 and 8 degrees C (36 and 46 degrees F). Do not freeze. Keep in carton to protect from light. Throw away this medicine if it is left  out of the refrigerator for more than 48 hours. Throw away any unused medicine after the expiration date. NOTE: This sheet is a summary. It may not cover all possible information. If you have questions about this medicine, talk to your doctor, pharmacist, or health care provider.  2015, [employer]. (2007-11-18  15:41:44) Cyclophosphamide injection What is this medicine? CYCLOPHOSPHAMIDE (sye kloe FOSS fa mide) is a chemotherapy drug. It slows the growth of cancer cells. This medicine is used to treat many types of cancer like lymphoma, myeloma, leukemia, breast cancer, and ovarian cancer, to name a few. This medicine may be used for other purposes; ask your health care provider or pharmacist if you have questions. COMMON BRAND NAME(S): Cytoxan, Neosar What should I tell my health care provider before I take this medicine? They need to know if you have any of these conditions: -blood disorders -history of other chemotherapy -infection -kidney disease -liver disease -recent or ongoing radiation therapy -tumors in the bone marrow -an unusual or allergic reaction to cyclophosphamide, other chemotherapy, other medicines, foods, dyes, or preservatives -pregnant or trying to get pregnant -breast-feeding How should I use this medicine? This drug is usually given as an injection into a vein or muscle or by infusion into a vein. It is administered in a hospital or clinic by a specially trained health care professional. Talk to your pediatrician regarding the use of this medicine in children. Special care may be needed. Overdosage: If you think you have taken too much of this medicine contact a poison control center or emergency room at once. NOTE: This medicine is only for you. Do not share this medicine with others. What if I miss a dose? It is important not to miss your dose. Call your doctor or health care professional if you are unable to keep an appointment. What may interact with this medicine? This medicine may interact with the following medications: -amiodarone -amphotericin B -azathioprine -certain antiviral medicines for HIV or AIDS such as protease inhibitors (e.g., indinavir, ritonavir) and zidovudine -certain blood pressure medications such as benazepril, captopril, enalapril, fosinopril,  lisinopril, moexipril, monopril, perindopril, quinapril, ramipril, trandolapril -certain cancer medications such as anthracyclines (e.g., daunorubicin, doxorubicin), busulfan, cytarabine, paclitaxel, pentostatin, tamoxifen, trastuzumab -certain diuretics such as chlorothiazide, chlorthalidone, hydrochlorothiazide, indapamide, metolazone -certain medicines that treat or prevent blood clots like warfarin -certain muscle relaxants such as succinylcholine -cyclosporine -etanercept -indomethacin -medicines to increase blood counts like filgrastim, pegfilgrastim, sargramostim -medicines used as general anesthesia -metronidazole -natalizumab This list may not describe all possible interactions. Give your health care provider a list of all the medicines, herbs, non-prescription drugs, or dietary supplements you use. Also tell them if you smoke, drink alcohol, or use illegal drugs. Some items may interact with your medicine. What should I watch for while using this medicine? Visit your doctor for checks on your progress. This drug may make you feel generally unwell. This is not uncommon, as chemotherapy can affect healthy cells as well as cancer cells. Report any side effects. Continue your course of treatment even though you feel ill unless your doctor tells you to stop. Drink water or other fluids as directed. Urinate often, even at night. In some cases, you may be given additional medicines to help with side effects. Follow all directions for their use. Call your doctor or health care professional for advice if you get a fever, chills or sore throat, or other symptoms of a cold or flu. Do not treat yourself. This drug decreases your body's ability to  fight infections. Try to avoid being around people who are sick. This medicine may increase your risk to bruise or bleed. Call your doctor or health care professional if you notice any unusual bleeding. Be careful brushing and flossing your teeth or using a  toothpick because you may get an infection or bleed more easily. If you have any dental work done, tell your dentist you are receiving this medicine. You may get drowsy or dizzy. Do not drive, use machinery, or do anything that needs mental alertness until you know how this medicine affects you. Do not become pregnant while taking this medicine or for 1 year after stopping it. Women should inform their doctor if they wish to become pregnant or think they might be pregnant. Men should not father a child while taking this medicine and for 4 months after stopping it. There is a potential for serious side effects to an unborn child. Talk to your health care professional or pharmacist for more information. Do not breast-feed an infant while taking this medicine. This medicine may interfere with the ability to have a child. This medicine has caused ovarian failure in some women. This medicine has caused reduced sperm counts in some men. You should talk with your doctor or health care professional if you are concerned about your fertility. If you are going to have surgery, tell your doctor or health care professional that you have taken this medicine. What side effects may I notice from receiving this medicine? Side effects that you should report to your doctor or health care professional as soon as possible: -allergic reactions like skin rash, itching or hives, swelling of the face, lips, or tongue -low blood counts - this medicine may decrease the number of white blood cells, red blood cells and platelets. You may be at increased risk for infections and bleeding. -signs of infection - fever or chills, cough, sore throat, pain or difficulty passing urine -signs of decreased platelets or bleeding - bruising, pinpoint red spots on the skin, black, tarry stools, blood in the urine -signs of decreased red blood cells - unusually weak or tired, fainting spells, lightheadedness -breathing problems -dark  urine -dizziness -palpitations -swelling of the ankles, feet, hands -trouble passing urine or change in the amount of urine -weight gain -yellowing of the eyes or skin Side effects that usually do not require medical attention (report to your doctor or health care professional if they continue or are bothersome): -changes in nail or skin color -hair loss -missed menstrual periods -mouth sores -nausea, vomiting This list may not describe all possible side effects. Call your doctor for medical advice about side effects. You may report side effects to FDA at 1-800-FDA-1088. Where should I keep my medicine? This drug is given in a hospital or clinic and will not be stored at home. NOTE: This sheet is a summary. It may not cover all possible information. If you have questions about this medicine, talk to your doctor, pharmacist, or health care provider.  2015, [employer]. (2012-03-01 16:22:58) Doxorubicin injection What is this medicine? DOXORUBICIN (dox oh ROO bi sin) is a chemotherapy drug. It is used to treat many kinds of cancer like Hodgkin's disease, leukemia, non-Hodgkin's lymphoma, neuroblastoma, sarcoma, and Wilms' tumor. It is also used to treat bladder cancer, breast cancer, lung cancer, ovarian cancer, stomach cancer, and thyroid cancer. This medicine may be used for other purposes; ask your health care provider or pharmacist if you have questions. COMMON BRAND NAME(S): Adriamycin, Adriamycin PFS, Adriamycin  RDF, Rubex What should I tell my health care provider before I take this medicine? They need to know if you have any of these conditions: -blood disorders -heart disease, recent heart attack -infection (especially a virus infection such as chickenpox, cold sores, or herpes) -irregular heartbeat -liver disease -recent or ongoing radiation therapy -an unusual or allergic reaction to doxorubicin, other chemotherapy agents, other medicines, foods, dyes, or  preservatives -pregnant or trying to get pregnant -breast-feeding How should I use this medicine? This drug is given as an infusion into a vein. It is administered in a hospital or clinic by a specially trained health care professional. If you have pain, swelling, burning or any unusual feeling around the site of your injection, tell your health care professional right away. Talk to your pediatrician regarding the use of this medicine in children. Special care may be needed. Overdosage: If you think you have taken too much of this medicine contact a poison control center or emergency room at once. NOTE: This medicine is only for you. Do not share this medicine with others. What if I miss a dose? It is important not to miss your dose. Call your doctor or health care professional if you are unable to keep an appointment. What may interact with this medicine? Do not take this medicine with any of the following medications: -cisapride -droperidol -halofantrine -pimozide -zidovudine This medicine may also interact with the following medications: -chloroquine -chlorpromazine -clarithromycin -cyclophosphamide -cyclosporine -erythromycin -medicines for depression, anxiety, or psychotic disturbances -medicines for irregular heart beat like amiodarone, bepridil, dofetilide, encainide, flecainide, propafenone, quinidine -medicines for seizures like ethotoin, fosphenytoin, phenytoin -medicines for nausea, vomiting like dolasetron, ondansetron, palonosetron -medicines to increase blood counts like filgrastim, pegfilgrastim, sargramostim -methadone -methotrexate -pentamidine -progesterone -vaccines -verapamil Talk to your doctor or health care professional before taking any of these medicines: -acetaminophen -aspirin -ibuprofen -ketoprofen -naproxen This list may not describe all possible interactions. Give your health care provider a list of all the medicines, herbs, non-prescription  drugs, or dietary supplements you use. Also tell them if you smoke, drink alcohol, or use illegal drugs. Some items may interact with your medicine. What should I watch for while using this medicine? Your condition will be monitored carefully while you are receiving this medicine. You will need important blood work done while you are taking this medicine. This drug may make you feel generally unwell. This is not uncommon, as chemotherapy can affect healthy cells as well as cancer cells. Report any side effects. Continue your course of treatment even though you feel ill unless your doctor tells you to stop. Your urine may turn red for a few days after your dose. This is not blood. If your urine is dark or brown, call your doctor. In some cases, you may be given additional medicines to help with side effects. Follow all directions for their use. Call your doctor or health care professional for advice if you get a fever, chills or sore throat, or other symptoms of a cold or flu. Do not treat yourself. This drug decreases your body's ability to fight infections. Try to avoid being around people who are sick. This medicine may increase your risk to bruise or bleed. Call your doctor or health care professional if you notice any unusual bleeding. Be careful brushing and flossing your teeth or using a toothpick because you may get an infection or bleed more easily. If you have any dental work done, tell your dentist you are receiving this medicine. Avoid taking  products that contain aspirin, acetaminophen, ibuprofen, naproxen, or ketoprofen unless instructed by your doctor. These medicines may hide a fever. Men and women of childbearing age should use effective birth control methods while using taking this medicine. Do not become pregnant while taking this medicine. There is a potential for serious side effects to an unborn child. Talk to your health care professional or pharmacist for more information. Do not  breast-feed an infant while taking this medicine. Do not let others touch your urine or other body fluids for 5 days after each treatment with this medicine. Caregivers should wear latex gloves to avoid touching body fluids during this time. There is a maximum amount of this medicine you should receive throughout your life. The amount depends on the medical condition being treated and your overall health. Your doctor will watch how much of this medicine you receive in your lifetime. Tell your doctor if you have taken this medicine before. What side effects may I notice from receiving this medicine? Side effects that you should report to your doctor or health care professional as soon as possible: -allergic reactions like skin rash, itching or hives, swelling of the face, lips, or tongue -low blood counts - this medicine may decrease the number of white blood cells, red blood cells and platelets. You may be at increased risk for infections and bleeding. -signs of infection - fever or chills, cough, sore throat, pain or difficulty passing urine -signs of decreased platelets or bleeding - bruising, pinpoint red spots on the skin, black, tarry stools, blood in the urine -signs of decreased red blood cells - unusually weak or tired, fainting spells, lightheadedness -breathing problems -chest pain -fast, irregular heartbeat -mouth sores -nausea, vomiting -pain, swelling, redness at site where injected -pain, tingling, numbness in the hands or feet -swelling of ankles, feet, or hands -unusual bleeding or bruising Side effects that usually do not require medical attention (report to your doctor or health care professional if they continue or are bothersome): -diarrhea -facial flushing -hair loss -loss of appetite -missed menstrual periods -nail discoloration or damage -red or watery eyes -red colored urine -stomach upset This list may not describe all possible side effects. Call your doctor for  medical advice about side effects. You may report side effects to FDA at 1-800-FDA-1088. Where should I keep my medicine? This drug is given in a hospital or clinic and will not be stored at home. NOTE: This sheet is a summary. It may not cover all possible information. If you have questions about this medicine, talk to your doctor, pharmacist, or health care provider.  2015, [employer]. (2012-08-13 09:54:34)

## 2013-11-06 NOTE — Patient Instructions (Addendum)
[facility] Discharge Instructions for Patients Receiving Chemotherapy  Today you received the following chemotherapy agents:   To help prevent nausea and vomiting after your treatment, we encourage you to take your nausea medication.  Take it as often as prescribed.     If you develop nausea and vomiting that is not controlled by your nausea medication, call the clinic. If it is after clinic hours your family physician or the after hours number for the clinic or go to the Emergency Department.   BELOW ARE SYMPTOMS THAT SHOULD BE REPORTED IMMEDIATELY:  *FEVER GREATER THAN 100.5 F  *CHILLS WITH OR WITHOUT FEVER  NAUSEA AND VOMITING THAT IS NOT CONTROLLED WITH YOUR NAUSEA MEDICATION  *UNUSUAL SHORTNESS OF BREATH  *UNUSUAL BRUISING OR BLEEDING  TENDERNESS IN MOUTH AND THROAT WITH OR WITHOUT PRESENCE OF ULCERS  *URINARY PROBLEMS  *BOWEL PROBLEMS  UNUSUAL RASH Items with * indicate a potential emergency and should be followed up as soon as possible.  Feel free to call the clinic you have any questions or concerns. The clinic phone number is [phone].   I have been informed and understand all the instructions given to me. I know to contact the clinic, my physician, or go to the Emergency Department if any problems should occur. I do not have any questions at this time, but understand that I may call the clinic during office hours   should I have any questions or need assistance in obtaining follow up care.    __________________________________________  _____________  __________ Signature of Patient or Authorized Representative            Date                   Time    __________________________________________ Nurse's Signature   Cyclophosphamide injection (cytoxan) What is this medicine? CYCLOPHOSPHAMIDE (sye kloe FOSS fa mide) is a chemotherapy drug. It slows the growth of cancer cells. This medicine is used to treat many types of cancer like lymphoma,  myeloma, leukemia, breast cancer, and ovarian cancer, to name a few. This medicine may be used for other purposes; ask your health care provider or pharmacist if you have questions. COMMON BRAND NAME(S): Cytoxan, Neosar What should I tell my health care provider before I take this medicine? They need to know if you have any of these conditions: -blood disorders -history of other chemotherapy -infection -kidney disease -liver disease -recent or ongoing radiation therapy -tumors in the bone marrow -an unusual or allergic reaction to cyclophosphamide, other chemotherapy, other medicines, foods, dyes, or preservatives -pregnant or trying to get pregnant -breast-feeding How should I use this medicine? This drug is usually given as an injection into a vein or muscle or by infusion into a vein. It is administered in a hospital or clinic by a specially trained health care professional. Talk to your pediatrician regarding the use of this medicine in children. Special care may be needed. Overdosage: If you think you have taken too much of this medicine contact a poison control center or emergency room at once. NOTE: This medicine is only for you. Do not share this medicine with others. What if I miss a dose? It is important not to miss your dose. Call your doctor or health care professional if you are unable to keep an appointment. What may interact with this medicine? This medicine may interact with the following medications: -amiodarone -amphotericin B -azathioprine -certain antiviral medicines for HIV or AIDS such as protease inhibitors (  e.g., indinavir, ritonavir) and zidovudine -certain blood pressure medications such as benazepril, captopril, enalapril, fosinopril, lisinopril, moexipril, monopril, perindopril, quinapril, ramipril, trandolapril -certain cancer medications such as anthracyclines (e.g., daunorubicin, doxorubicin), busulfan, cytarabine, paclitaxel, pentostatin, tamoxifen,  trastuzumab -certain diuretics such as chlorothiazide, chlorthalidone, hydrochlorothiazide, indapamide, metolazone -certain medicines that treat or prevent blood clots like warfarin -certain muscle relaxants such as succinylcholine -cyclosporine -etanercept -indomethacin -medicines to increase blood counts like filgrastim, pegfilgrastim, sargramostim -medicines used as general anesthesia -metronidazole -natalizumab This list may not describe all possible interactions. Give your health care provider a list of all the medicines, herbs, non-prescription drugs, or dietary supplements you use. Also tell them if you smoke, drink alcohol, or use illegal drugs. Some items may interact with your medicine. What should I watch for while using this medicine? Visit your doctor for checks on your progress. This drug may make you feel generally unwell. This is not uncommon, as chemotherapy can affect healthy cells as well as cancer cells. Report any side effects. Continue your course of treatment even though you feel ill unless your doctor tells you to stop. Drink water or other fluids as directed. Urinate often, even at night. In some cases, you may be given additional medicines to help with side effects. Follow all directions for their use. Call your doctor or health care professional for advice if you get a fever, chills or sore throat, or other symptoms of a cold or flu. Do not treat yourself. This drug decreases your body's ability to fight infections. Try to avoid being around people who are sick. This medicine may increase your risk to bruise or bleed. Call your doctor or health care professional if you notice any unusual bleeding. Be careful brushing and flossing your teeth or using a toothpick because you may get an infection or bleed more easily. If you have any dental work done, tell your dentist you are receiving this medicine. You may get drowsy or dizzy. Do not drive, use machinery, or do anything  that needs mental alertness until you know how this medicine affects you. Do not become pregnant while taking this medicine or for 1 year after stopping it. Women should inform their doctor if they wish to become pregnant or think they might be pregnant. Men should not father a child while taking this medicine and for 4 months after stopping it. There is a potential for serious side effects to an unborn child. Talk to your health care professional or pharmacist for more information. Do not breast-feed an infant while taking this medicine. This medicine may interfere with the ability to have a child. This medicine has caused ovarian failure in some women. This medicine has caused reduced sperm counts in some men. You should talk with your doctor or health care professional if you are concerned about your fertility. If you are going to have surgery, tell your doctor or health care professional that you have taken this medicine. What side effects may I notice from receiving this medicine? Side effects that you should report to your doctor or health care professional as soon as possible: -allergic reactions like skin rash, itching or hives, swelling of the face, lips, or tongue -low blood counts - this medicine may decrease the number of white blood cells, red blood cells and platelets. You may be at increased risk for infections and bleeding. -signs of infection - fever or chills, cough, sore throat, pain or difficulty passing urine -signs of decreased platelets or bleeding - bruising, pinpoint red spots  on the skin, black, tarry stools, blood in the urine -signs of decreased red blood cells - unusually weak or tired, fainting spells, lightheadedness -breathing problems -dark urine -dizziness -palpitations -swelling of the ankles, feet, hands -trouble passing urine or change in the amount of urine -weight gain -yellowing of the eyes or skin Side effects that usually do not require medical attention  (report to your doctor or health care professional if they continue or are bothersome): -changes in nail or skin color -hair loss -missed menstrual periods -mouth sores -nausea, vomiting This list may not describe all possible side effects. Call your doctor for medical advice about side effects. You may report side effects to FDA at 1-800-FDA-1088. Where should I keep my medicine? This drug is given in a hospital or clinic and will not be stored at home. NOTE: This sheet is a summary. It may not cover all possible information. If you have questions about this medicine, talk to your doctor, pharmacist, or health care provider.  2015, [employer]. (2012-03-01 16:22:58)   Doxorubicin injection (adriamycin) What is this medicine? DOXORUBICIN (dox oh ROO bi sin) is a chemotherapy drug. It is used to treat many kinds of cancer like Hodgkin's disease, leukemia, non-Hodgkin's lymphoma, neuroblastoma, sarcoma, and Wilms' tumor. It is also used to treat bladder cancer, breast cancer, lung cancer, ovarian cancer, stomach cancer, and thyroid cancer. This medicine may be used for other purposes; ask your health care provider or pharmacist if you have questions. COMMON BRAND NAME(S): Adriamycin, Adriamycin PFS, Adriamycin RDF, Rubex What should I tell my health care provider before I take this medicine? They need to know if you have any of these conditions: -blood disorders -heart disease, recent heart attack -infection (especially a virus infection such as chickenpox, cold sores, or herpes) -irregular heartbeat -liver disease -recent or ongoing radiation therapy -an unusual or allergic reaction to doxorubicin, other chemotherapy agents, other medicines, foods, dyes, or preservatives -pregnant or trying to get pregnant -breast-feeding How should I use this medicine? This drug is given as an infusion into a vein. It is administered in a hospital or clinic by a specially trained health care  professional. If you have pain, swelling, burning or any unusual feeling around the site of your injection, tell your health care professional right away. Talk to your pediatrician regarding the use of this medicine in children. Special care may be needed. Overdosage: If you think you have taken too much of this medicine contact a poison control center or emergency room at once. NOTE: This medicine is only for you. Do not share this medicine with others. What if I miss a dose? It is important not to miss your dose. Call your doctor or health care professional if you are unable to keep an appointment. What may interact with this medicine? Do not take this medicine with any of the following medications: -cisapride -droperidol -halofantrine -pimozide -zidovudine This medicine may also interact with the following medications: -chloroquine -chlorpromazine -clarithromycin -cyclophosphamide -cyclosporine -erythromycin -medicines for depression, anxiety, or psychotic disturbances -medicines for irregular heart beat like amiodarone, bepridil, dofetilide, encainide, flecainide, propafenone, quinidine -medicines for seizures like ethotoin, fosphenytoin, phenytoin -medicines for nausea, vomiting like dolasetron, ondansetron, palonosetron -medicines to increase blood counts like filgrastim, pegfilgrastim, sargramostim -methadone -methotrexate -pentamidine -progesterone -vaccines -verapamil Talk to your doctor or health care professional before taking any of these medicines: -acetaminophen -aspirin -ibuprofen -ketoprofen -naproxen This list may not describe all possible interactions. Give your health care provider a list of all the medicines,  herbs, non-prescription drugs, or dietary supplements you use. Also tell them if you smoke, drink alcohol, or use illegal drugs. Some items may interact with your medicine. What should I watch for while using this medicine? Your condition will be  monitored carefully while you are receiving this medicine. You will need important blood work done while you are taking this medicine. This drug may make you feel generally unwell. This is not uncommon, as chemotherapy can affect healthy cells as well as cancer cells. Report any side effects. Continue your course of treatment even though you feel ill unless your doctor tells you to stop. Your urine may turn red for a few days after your dose. This is not blood. If your urine is dark or brown, call your doctor. In some cases, you may be given additional medicines to help with side effects. Follow all directions for their use. Call your doctor or health care professional for advice if you get a fever, chills or sore throat, or other symptoms of a cold or flu. Do not treat yourself. This drug decreases your body's ability to fight infections. Try to avoid being around people who are sick. This medicine may increase your risk to bruise or bleed. Call your doctor or health care professional if you notice any unusual bleeding. Be careful brushing and flossing your teeth or using a toothpick because you may get an infection or bleed more easily. If you have any dental work done, tell your dentist you are receiving this medicine. Avoid taking products that contain aspirin, acetaminophen, ibuprofen, naproxen, or ketoprofen unless instructed by your doctor. These medicines may hide a fever. Men and women of childbearing age should use effective birth control methods while using taking this medicine. Do not become pregnant while taking this medicine. There is a potential for serious side effects to an unborn child. Talk to your health care professional or pharmacist for more information. Do not breast-feed an infant while taking this medicine. Do not let others touch your urine or other body fluids for 5 days after each treatment with this medicine. Caregivers should wear latex gloves to avoid touching body fluids  during this time. There is a maximum amount of this medicine you should receive throughout your life. The amount depends on the medical condition being treated and your overall health. Your doctor will watch how much of this medicine you receive in your lifetime. Tell your doctor if you have taken this medicine before. What side effects may I notice from receiving this medicine? Side effects that you should report to your doctor or health care professional as soon as possible: -allergic reactions like skin rash, itching or hives, swelling of the face, lips, or tongue -low blood counts - this medicine may decrease the number of white blood cells, red blood cells and platelets. You may be at increased risk for infections and bleeding. -signs of infection - fever or chills, cough, sore throat, pain or difficulty passing urine -signs of decreased platelets or bleeding - bruising, pinpoint red spots on the skin, black, tarry stools, blood in the urine -signs of decreased red blood cells - unusually weak or tired, fainting spells, lightheadedness -breathing problems -chest pain -fast, irregular heartbeat -mouth sores -nausea, vomiting -pain, swelling, redness at site where injected -pain, tingling, numbness in the hands or feet -swelling of ankles, feet, or hands -unusual bleeding or bruising Side effects that usually do not require medical attention (report to your doctor or health care professional if they continue  or are bothersome): -diarrhea -facial flushing -hair loss -loss of appetite -missed menstrual periods -nail discoloration or damage -red or watery eyes -red colored urine -stomach upset This list may not describe all possible side effects. Call your doctor for medical advice about side effects. You may report side effects to FDA at 1-800-FDA-1088. Where should I keep my medicine? This drug is given in a hospital or clinic and will not be stored at home. NOTE: This sheet is a  summary. It may not cover all possible information. If you have questions about this medicine, talk to your doctor, pharmacist, or health care provider.  2015, [employer]. (2012-08-13 09:54:34)   Pegfilgrastim injection What is this medicine? PEGFILGRASTIM (peg fil GRA stim) helps the body make more white blood cells. It is used to prevent infection in people with low amounts of white blood cells following cancer treatment. This medicine may be used for other purposes; ask your health care provider or pharmacist if you have questions. COMMON BRAND NAME(S): Neulasta What should I tell my health care provider before I take this medicine? They need to know if you have any of these conditions: -sickle cell disease -an unusual or allergic reaction to pegfilgrastim, filgrastim, E.coli protein, other medicines, foods, dyes, or preservatives -pregnant or trying to get pregnant -breast-feeding How should I use this medicine? This medicine is for injection under the skin. It is usually given by a health care professional in a hospital or clinic setting. If you get this medicine at home, you will be taught how to prepare and give this medicine. Do not shake this medicine. Use exactly as directed. Take your medicine at regular intervals. Do not take your medicine more often than directed. It is important that you put your used needles and syringes in a special sharps container. Do not put them in a trash can. If you do not have a sharps container, call your pharmacist or healthcare provider to get one. Talk to your pediatrician regarding the use of this medicine in children. While this drug may be prescribed for children who weigh more than 45 kg for selected conditions, precautions do apply Overdosage: If you think you have taken too much of this medicine contact a poison control center or emergency room at once. NOTE: This medicine is only for you. Do not share this medicine with  others. What if I miss a dose? If you miss a dose, take it as soon as you can. If it is almost time for your next dose, take only that dose. Do not take double or extra doses. What may interact with this medicine? -lithium -medicines for growth therapy This list may not describe all possible interactions. Give your health care provider a list of all the medicines, herbs, non-prescription drugs, or dietary supplements you use. Also tell them if you smoke, drink alcohol, or use illegal drugs. Some items may interact with your medicine. What should I watch for while using this medicine? Visit your doctor for regular check ups. You will need important blood work done while you are taking this medicine. What side effects may I notice from receiving this medicine? Side effects that you should report to your doctor or health care professional as soon as possible: -allergic reactions like skin rash, itching or hives, swelling of the face, lips, or tongue -breathing problems -fever -pain, redness, or swelling where injected -shoulder pain -stomach or side pain Side effects that usually do not require medical attention (report to your doctor or  health care professional if they continue or are bothersome): -aches, pains -headache -loss of appetite -nausea, vomiting -unusually tired This list may not describe all possible side effects. Call your doctor for medical advice about side effects. You may report side effects to FDA at 1-800-FDA-1088. Where should I keep my medicine? Keep out of the reach of children. Store in a refrigerator between 2 and 8 degrees C (36 and 46 degrees F). Do not freeze. Keep in carton to protect from light. Throw away this medicine if it is left out of the refrigerator for more than 48 hours. Throw away any unused medicine after the expiration date. NOTE: This sheet is a summary. It may not cover all possible information. If you have questions about this medicine, talk to  your doctor, pharmacist, or health care provider.  2015, [employer] Standard. (2007-11-18 15:41:44)

## 2013-11-07 ENCOUNTER — Ambulatory Visit (INDEPENDENT_AMBULATORY_CARE_PROVIDER_SITE_OTHER): Payer: BC Managed Care – PPO | Admitting: General Surgery

## 2013-11-07 ENCOUNTER — Ambulatory Visit: Payer: BC Managed Care – PPO

## 2013-11-07 ENCOUNTER — Telehealth: Payer: Self-pay | Admitting: *Deleted

## 2013-11-07 ENCOUNTER — Ambulatory Visit (HOSPITAL_BASED_OUTPATIENT_CLINIC_OR_DEPARTMENT_OTHER): Payer: BC Managed Care – PPO

## 2013-11-07 ENCOUNTER — Encounter (INDEPENDENT_AMBULATORY_CARE_PROVIDER_SITE_OTHER): Payer: Self-pay | Admitting: General Surgery

## 2013-11-07 VITALS — BP 119/66 | HR 97 | Temp 97.9°F

## 2013-11-07 VITALS — BP 122/78 | HR 73 | Temp 97.0°F | Ht 66.0 in | Wt 152.0 lb

## 2013-11-07 DIAGNOSIS — C50219 Malignant neoplasm of upper-inner quadrant of unspecified female breast: Secondary | ICD-10-CM

## 2013-11-07 DIAGNOSIS — C50212 Malignant neoplasm of upper-inner quadrant of left female breast: Secondary | ICD-10-CM

## 2013-11-07 DIAGNOSIS — C50919 Malignant neoplasm of unspecified site of unspecified female breast: Secondary | ICD-10-CM

## 2013-11-07 DIAGNOSIS — Z5189 Encounter for other specified aftercare: Secondary | ICD-10-CM

## 2013-11-07 DIAGNOSIS — C773 Secondary and unspecified malignant neoplasm of axilla and upper limb lymph nodes: Secondary | ICD-10-CM

## 2013-11-07 MED ORDER — PEGFILGRASTIM INJECTION 6 MG/0.6ML
6.0000 mg | Freq: Once | SUBCUTANEOUS | Status: AC
  Administered 2013-11-07: 6 mg via SUBCUTANEOUS
  Filled 2013-11-07: qty 0.6

## 2013-11-07 NOTE — Telephone Encounter (Signed)
[name] [name] here for Neulasta injection following 1st a/c chemo treatment.  States that she is doing great.  No nausea, vomiting or diarrhea.  Is drinking plenty of fluids and eating well.  All questions answered.  Knows to call if she has any problems or concerns.

## 2013-11-07 NOTE — Progress Notes (Signed)
Patient comes into the office as she noticed a stitch that was protruding slightly through the skin in her right neck and the Port-A-Cath insertion site  Exam: Wounds are well healed. There is a single Monocryl suture just above the skin which I removed. No infection. She had her first chemotherapy treatment yesterday.  I will plan to see her back in approximately 4 months.

## 2013-11-07 NOTE — Patient Instructions (Signed)

## 2013-11-12 ENCOUNTER — Ambulatory Visit (HOSPITAL_BASED_OUTPATIENT_CLINIC_OR_DEPARTMENT_OTHER): Payer: BC Managed Care – PPO | Admitting: Oncology

## 2013-11-12 ENCOUNTER — Other Ambulatory Visit (HOSPITAL_BASED_OUTPATIENT_CLINIC_OR_DEPARTMENT_OTHER): Payer: BC Managed Care – PPO

## 2013-11-12 ENCOUNTER — Ambulatory Visit: Payer: BC Managed Care – PPO | Attending: General Surgery | Admitting: Physical Therapy

## 2013-11-12 ENCOUNTER — Telehealth: Payer: Self-pay | Admitting: Oncology

## 2013-11-12 VITALS — BP 125/73 | HR 87 | Temp 98.3°F | Resp 18 | Ht 66.0 in | Wt 146.9 lb

## 2013-11-12 DIAGNOSIS — C50919 Malignant neoplasm of unspecified site of unspecified female breast: Secondary | ICD-10-CM

## 2013-11-12 DIAGNOSIS — C50212 Malignant neoplasm of upper-inner quadrant of left female breast: Secondary | ICD-10-CM

## 2013-11-12 DIAGNOSIS — Z170 Estrogen receptor positive status [ER+]: Secondary | ICD-10-CM

## 2013-11-12 DIAGNOSIS — IMO0001 Reserved for inherently not codable concepts without codable children: Secondary | ICD-10-CM | POA: Insufficient documentation

## 2013-11-12 DIAGNOSIS — F411 Generalized anxiety disorder: Secondary | ICD-10-CM

## 2013-11-12 DIAGNOSIS — R5381 Other malaise: Secondary | ICD-10-CM

## 2013-11-12 DIAGNOSIS — C773 Secondary and unspecified malignant neoplasm of axilla and upper limb lymph nodes: Secondary | ICD-10-CM

## 2013-11-12 DIAGNOSIS — R5383 Other fatigue: Secondary | ICD-10-CM

## 2013-11-12 LAB — COMPREHENSIVE METABOLIC PANEL (CC13)
ALT: 26 U/L (ref 0–55)
AST: 15 U/L (ref 5–34)
Albumin: 3.7 g/dL (ref 3.5–5.0)
Alkaline Phosphatase: 78 U/L (ref 40–150)
Anion Gap: 9 mEq/L (ref 3–11)
BUN: 18.9 mg/dL (ref 7.0–26.0)
CALCIUM: 8.9 mg/dL (ref 8.4–10.4)
CO2: 26 meq/L (ref 22–29)
CREATININE: 0.8 mg/dL (ref 0.6–1.1)
Chloride: 103 mEq/L (ref 98–109)
Glucose: 85 mg/dl (ref 70–140)
POTASSIUM: 3.9 meq/L (ref 3.5–5.1)
Sodium: 138 mEq/L (ref 136–145)
TOTAL PROTEIN: 6.8 g/dL (ref 6.4–8.3)
Total Bilirubin: 0.57 mg/dL (ref 0.20–1.20)

## 2013-11-12 LAB — CBC WITH DIFFERENTIAL/PLATELET
BASO%: 0.7 % (ref 0.0–2.0)
Basophils Absolute: 0 10*3/uL (ref 0.0–0.1)
EOS%: 4.7 % (ref 0.0–7.0)
Eosinophils Absolute: 0.1 10*3/uL (ref 0.0–0.5)
HCT: 38.9 % (ref 34.8–46.6)
HGB: 12.9 g/dL (ref 11.6–15.9)
LYMPH#: 1.2 10*3/uL (ref 0.9–3.3)
LYMPH%: 61.9 % — ABNORMAL HIGH (ref 14.0–49.7)
MCH: 31.2 pg (ref 25.1–34.0)
MCHC: 33.2 g/dL (ref 31.5–36.0)
MCV: 93.8 fL (ref 79.5–101.0)
MONO#: 0.1 10*3/uL (ref 0.1–0.9)
MONO%: 3.7 % (ref 0.0–14.0)
NEUT#: 0.6 10*3/uL — ABNORMAL LOW (ref 1.5–6.5)
NEUT%: 29 % — AB (ref 38.4–76.8)
Platelets: 167 10*3/uL (ref 145–400)
RBC: 4.15 10*6/uL (ref 3.70–5.45)
RDW: 12.4 % (ref 11.2–14.5)
WBC: 1.9 10*3/uL — AB (ref 3.9–10.3)

## 2013-11-12 MED ORDER — GABAPENTIN 300 MG PO CAPS: 300.0000 mg | ORAL_CAPSULE | Freq: Every day | ORAL | Status: DC

## 2013-11-12 NOTE — Telephone Encounter (Signed)
per pof to sch appt-sch and gave pt copy of sch °

## 2013-11-12 NOTE — Progress Notes (Signed)
[name] [name]  [phone] [phone]     ID: [name] [name] OB: [birth year]  MR#: [id number]  TIR#:[id number]  PCP: [name]., MD GYN:  [name] SU: Excell Seltzer OTHER MD: [name]  CHIEF COMPLAINT: Locally advanced breast cancer CURRENT TREATMENT: To start chemotherapy July 2015  BREAST CANCER HISTORY: From the original intake note 08/20/2013:  Making noted a change, like a small hard pea in her left axilla. She brought this to Dr. [name] attention and bilateral diagnostic mammography and left ultrasonography at the [facility] Center for 07/18/2013 showed a small area of asymmetry in the inner left breast measuring perhaps 5 mm which was not palpable by physical exam. In the right breast there was some loosely grouped calcifications spanning up to 0.7 cm. Ultrasound of the left breast confirmed an ill-defined area of shadowing at the 9:00 position measuring up to 1.3 cm. There was also a 4 mm circumscribed hypoechoic mass at the 10:00 position. This is felt to be probably benign. In the left axilla there were 2 lymph nodes with slightly increased cortical thickening, although the fatty hila were maintained. These were not related to the patient's feeling of a "pea like mass" in her left axilla, which had by then pretty much resolved.   On 08/07/2013 the patient underwent left breast needle core biopsy and this showed ([id number]) and invasive lobular carcinoma (E-cadherin negative) estrogen receptor 70% positive with strong staining intensity, progesterone receptor 70% positive and strong staining intensity, with a proliferation marker of 7% and no HER-2 amplification, the signals ratio being 0.94 in the number per cell 1.50.  Bilateral breast MRI 08/15/2013 found an irregular spiculated mass in the left breast measuring approximately 1.5 cm. There was no abnormal enhancement of the pectoralis. There were at least 2 left axillary lymph nodes and  showed focal cortical thickening. There was a single left internal mammary lymph node at the level of the biopsy-proven malignancy which was not pathologic in appearance. The right breast and axilla were benign.  The patient's subsequent history is as detailed below   INTERVAL HISTORY: Icy is here today for followup of her stage III lobular breast cancer. Today is day 7 cycle one of 4 planned cycles of dose dense doxorubicin and cyclophosphamide, with Neulasta given day 2, to be followed by paclitaxel.  REVIEW OF SYSTEMS: She did very well the first 2 days of chemotherapy. On days 3 though she was very fatigued. She had trouble just getting through minimal housework like doing laundry. She also became constipated. The fatigue persisted through day 4. By day 5 she was able to get back to work, although "I was and sharp". She had a little bit of blurred vision which bothered her at work. By day 6, which was yesterday, she was a ready feeling better and she had resolved the constipation problem with MiraLAX and Colace. Her hot flashes have considerably increased and while the daytime once are bothersome, the nighttime once wake her up 3 or 4 times a night. Of course adds to the fatigue. She'll little bit of epigastric pain which was fleeting. She does have a history of reflux. She is concerned that she still has a "bump" in her left axilla "which is exactly what I found the first time leading to the diagnosis". She would like me to examine that today. Aside from this a detailed review of systems today was noncontributory   PAST MEDICAL HISTORY: Past Medical History  Diagnosis Date  .  Palpitations 2009    Improved; Secondary to premature ventricular contractions. No problems since-pt states was caring for ailing father, anxiety  . Anxiety disorder     Mild: treated by her gynecologist with selective serotonin reuptake inhibitor  . Nonspecific abnormal electrocardiogram (ECG) (EKG) 2009  . GERD  (gastroesophageal reflux disease)     rolaids  . Hot flashes   . Malignant neoplasm of breast (female), unspecified site   . Family history of malignant neoplasm of breast   . Arthritis     hands and feet  . Wears glasses     PAST SURGICAL HISTORY: Past Surgical History  Procedure Laterality Date  . Thyroidectomy, partial    . Cesarean section    . Endometrial ablation    . Dilation and curettage of uterus    . Laparoscopic assisted vaginal hysterectomy  02/07/2011    Procedure: LAPAROSCOPIC ASSISTED VAGINAL HYSTERECTOMY;  Surgeon: [name];  Location: [facility];  Service: Gynecology;  Laterality: N/A;  . Abdominal hysterectomy  02/07/2011    Procedure: HYSTERECTOMY ABDOMINAL;  Surgeon: [name];  Location: [facility];  Service: Gynecology;  Laterality: N/A;  . Breast lumpectomy with needle localization and axillary lymph node dissection Left 09/09/2013    Procedure: LEFT BREAST LUMPECTOMY WITH NEEDLE LOCALIZATION AND LEFT AXILLARY LYMPH NODE DISSECTION;  Surgeon: [name], MD;  Location: [facility];  Service: General;  Laterality: Left;  . Portacath placement Right 09/29/2013    Procedure: INSERTION PORT-A-CATH;  Surgeon: [name], MD;  Location: [facility];  Service: General;  Laterality: Right;    FAMILY HISTORY Family History  Problem Relation Age of Onset  . Angina Father   . Aortic aneurysm Father   . Dementia Father   . Arrhythmia Maternal Aunt     A fib  . Breast cancer Maternal Aunt 56    currently 74  . Hypertension Other   . Arrhythmia Other     Uncle- a fib  . Arrhythmia Maternal Aunt     A fib  . Breast cancer Maternal Aunt     dx 24s; currently 39s  . Breast cancer Mother     dx 3s. Died at 31  . Colon cancer Maternal Grandmother     dx 42s; died at 55  . Colon cancer Maternal Grandfather     dx 25s; died in 31s  . Cancer Paternal Uncle     unknown primary in early 80s  . Breast cancer Maternal  Aunt     dx 9s; died in 28s  The patient's father died from heart disease in the setting of dementia at the age of 75. The patient's mother died at the age of 54. She had been diagnosed with breast cancer at the age of 40. The patient is a single child. Her mother had 4 sisters, 3 of whom had breast cancer diagnosed at the age of 74, 33, and 45. There is also history of colorectal cancer on the maternal side of the family   GYNECOLOGIC HISTORY:   menarche age 54, first live birth age 56, the patient is [place] P2. She underwent a hysterectomy without salpingo-oophorectomy in 2012. She took birth control pills approximately 3 years remotely, with no significant complications   SOCIAL HISTORY:  The patient works as a Publishing copy. Her husband [name]         . Son [name] lives in [place] where he works as a Public relations account executive. Daughter Darrick Penna this in  [place], where she works as an Engineering geologist in a [employer]. The patient has no grandchildren. She is a Information systems manager.    ADVANCED DIRECTIVES: Not in place   HEALTH MAINTENANCE: History  Substance Use Topics  . Smoking status: Never Smoker   . Smokeless tobacco: Not on file  . Alcohol Use: Yes     Comment: drinks 1-2 beers a week     Colonoscopy:2010   PAP:2012   Bone JOACZYS:0630   Lipid panel:  No Known Allergies  Current Outpatient Prescriptions  Medication Sig Dispense Refill  . calcium carbonate-magnesium hydroxide (ROLAIDS) 334 MG CHEW Chew 1 tablet by mouth 2 (two) times daily as needed. For indigestion       . Cholecalciferol (VITAMIN D) 2000 UNITS tablet Take 2,000 Units by mouth daily.      Marland Kitchen dexamethasone (DECADRON) 4 MG tablet Take 2 tablets by mouth once a day on the day after chemotherapy and then take 2 tablets two times a day for 2 days. Take with food.  30 tablet  1  . diclofenac (VOLTAREN) 75 MG EC tablet Take 75 mg by mouth as needed.      . lidocaine-prilocaine (EMLA) cream Apply 1 application topically as needed. Apply over  port 1-2 hours before chemo, cover with plastic wrap  30 g  0  . LORazepam (ATIVAN) 0.5 MG tablet Take 1 tablet (0.5 mg total) by mouth at bedtime as needed (Nausea or vomiting).  30 tablet  0  . NON FORMULARY       . ondansetron (ZOFRAN) 8 MG tablet Take 1 tablet (8 mg total) by mouth 2 (two) times daily as needed. Start on the third day after chemotherapy.  30 tablet  1  . oxyCODONE (OXY IR/ROXICODONE) 5 MG immediate release tablet Take 1-2 tablets (5-10 mg total) by mouth every 6 (six) hours as needed.  30 tablet  0  . prochlorperazine (COMPAZINE) 10 MG tablet Take 1 tablet (10 mg total) by mouth every 6 (six) hours as needed (Nausea or vomiting).  30 tablet  1   No current facility-administered medications for this visit.    OBJECTIVE: middle-aged white woman  Filed Vitals:   11/12/13 1605  BP: 125/73  Pulse: 87  Temp: 98.3 F (36.8 C)  Resp: 18     Body mass index is 23.72 kg/(m^2).    ECOG FS:1 - Symptomatic but completely ambulatory  Sclerae unicteric, pupils equal and reactive Oropharynx clear and moist-- no thrush or other lesions No cervical or supraclavicular adenopathy Lungs no rales or rhonchi Heart regular rate and rhythm Abd soft, nontender, positive bowel sounds MSK no focal spinal tenderness, no upper extremity lymphedema Neuro: nonfocal, well oriented, appropriate affect Breasts: The right breast is unremarkable. The left breast is status post lumpectomy. There is no evidence of local recurrence. In the left axilla just above the scar, there is a 1-1/2 cm firm slightly movable mass, which is very consistent with scar tissue. There is no erythema or tenderness associated with this. It is not fluctuant. Skin: The port is intact in the upper anterior right chest    LAB RESULTS:  CMP     Component Value Date/Time   NA 143 11/06/2013 0818   K 4.5 11/06/2013 0818   CO2 28 11/06/2013 0818   GLUCOSE 85 11/06/2013 0818   BUN 17.7 11/06/2013 0818   CREATININE 0.9 11/06/2013  0818   CALCIUM 9.7 11/06/2013 0818   PROT 7.4 11/06/2013 0818   ALBUMIN 4.1  11/06/2013 0818   AST 15 11/06/2013 0818   ALT 24 11/06/2013 0818   ALKPHOS 61 11/06/2013 0818   BILITOT 0.78 11/06/2013 0818    I No results found for this basename: SPEP,  UPEP,   kappa and lambda light chains    Lab Results  Component Value Date   WBC 1.9* 11/12/2013   NEUTROABS 0.6* 11/12/2013   HGB 12.9 11/12/2013   HCT 38.9 11/12/2013   MCV 93.8 11/12/2013   PLT 167 11/12/2013      Chemistry      Component Value Date/Time   NA 143 11/06/2013 0818   K 4.5 11/06/2013 0818   CO2 28 11/06/2013 0818   BUN 17.7 11/06/2013 0818   CREATININE 0.9 11/06/2013 0818      Component Value Date/Time   CALCIUM 9.7 11/06/2013 0818   ALKPHOS 61 11/06/2013 0818   AST 15 11/06/2013 0818   ALT 24 11/06/2013 0818   BILITOT 0.78 11/06/2013 0818       No results found for this basename: LABCA2    No components found with this basename: LABCA125    No results found for this basename: INR,  in the last 168 hours  Urinalysis No results found for this basename: colorurine,  appearanceur,  labspec,  phurine,  glucoseu,  hgbur,  bilirubinur,  ketonesur,  proteinur,  urobilinogen,  nitrite,  leukocytesur    STUDIES: -No results found.  ASSESSMENT: 53 y.o. [place] woman status post left breast biopsy for 01/18/2014 showing invasive lobular carcinoma (E-cadherin negative), estrogen receptor 70% positive, progesterone receptor 70% positive, with an MIB-1 of 7%, and no HER-2 amplification  (1) ultrasound-guided biopsy of a suspicious lymph node in the left axilla 08/19/2012 was positive  (2) genetics testing may 2015 was normal and did not reveal a mutation in the genes tested: ATM, BARD1, BRCA1, BRCA2, BRIP1, CDH1, CHEK2, MRE11A, MUTYH, NBN, NF1, PALB2, PTEN, RAD50, RAD51C, RAD51D, and TP53.  (3) status post left lumpectomy and axillary lymph node dissection 09/09/2013 for a pT1c pN2, stage IIIa invasive lobular carcinoma, grade 1, repeat  HER-2 again negative  (4) adjuvant chemotherapy will consist of doxorubicin and cyclophosphamide in dose dense fashion x4, with Neulasta support, followed by paclitaxel in dose dense fashion x4, again with Neulasta support  (5) radiation to follow chemotherapy  (6) antiestrogen therapy to follow radiation   PLAN: Jaymi did generally quite well with her first cycle and I am not making any changes in terms of her doses. I think part of the fatigue may have been due to the prochlorperazine, and I have asked her to change that medication to "as needed", particularly since she had absolutely no problems with nausea.  She knows she is going to get constipated we each cycle, so she will intervene as of day 1 with MiraLAX and Colace as needed. I think the epigastric discomfort she experienced briefly may be related to reflux. I have asked her to get some Mylanta and treated symptomatically.  His nighttime hot flashes will be I think well-controlled with gabapentin 300 mg at bedtime and I put in a prescription for her.  I reassured her that the mass in her left axilla is going to be scar tissue. We discussed "breast self-awareness", and she will learn the contour and feeling of the mass to the cheek allergists regarding any changes. We should examine this every visit to confirm that there has been no change.  Otherwise I reminded her to take her temperature if she feels  hot and call us if the temperature is about 100.5.  I reviewed her schedule here and added the missing visits and treatment dates. She will see me after the end of the 4 a.c. treatments at which time I will set her up for the paclitaxel treatments. Chauncey Cruel, MD   11/12/2013 4:14 PM

## 2013-11-13 NOTE — Addendum Note (Signed)
Addended by: Prentiss Bells on: 11/13/2013 08:56 AM   Modules accepted: Orders

## 2013-11-14 ENCOUNTER — Telehealth: Payer: Self-pay | Admitting: *Deleted

## 2013-11-14 NOTE — Telephone Encounter (Signed)
Message left by pt stating she has developed bone discomfort from the neulasta ( given 1 week ago )-  " I don't have tylenol but I do have ibuprophen- can I take it?"  This RN returned call to pt and obtained identified VM- message left informing pt she may take ibuprophen.

## 2013-11-20 ENCOUNTER — Ambulatory Visit (HOSPITAL_BASED_OUTPATIENT_CLINIC_OR_DEPARTMENT_OTHER): Payer: BC Managed Care – PPO

## 2013-11-20 ENCOUNTER — Other Ambulatory Visit: Payer: Self-pay | Admitting: Oncology

## 2013-11-20 ENCOUNTER — Ambulatory Visit (HOSPITAL_BASED_OUTPATIENT_CLINIC_OR_DEPARTMENT_OTHER): Payer: BC Managed Care – PPO | Admitting: Adult Health

## 2013-11-20 ENCOUNTER — Other Ambulatory Visit (HOSPITAL_BASED_OUTPATIENT_CLINIC_OR_DEPARTMENT_OTHER): Payer: BC Managed Care – PPO

## 2013-11-20 ENCOUNTER — Encounter: Payer: Self-pay | Admitting: Adult Health

## 2013-11-20 VITALS — BP 128/74 | HR 86 | Temp 98.1°F | Resp 18 | Ht 66.0 in | Wt 150.0 lb

## 2013-11-20 DIAGNOSIS — C50212 Malignant neoplasm of upper-inner quadrant of left female breast: Secondary | ICD-10-CM

## 2013-11-20 DIAGNOSIS — C50219 Malignant neoplasm of upper-inner quadrant of unspecified female breast: Secondary | ICD-10-CM

## 2013-11-20 DIAGNOSIS — F411 Generalized anxiety disorder: Secondary | ICD-10-CM

## 2013-11-20 DIAGNOSIS — C773 Secondary and unspecified malignant neoplasm of axilla and upper limb lymph nodes: Secondary | ICD-10-CM

## 2013-11-20 DIAGNOSIS — N951 Menopausal and female climacteric states: Secondary | ICD-10-CM

## 2013-11-20 DIAGNOSIS — C50919 Malignant neoplasm of unspecified site of unspecified female breast: Secondary | ICD-10-CM

## 2013-11-20 DIAGNOSIS — Z170 Estrogen receptor positive status [ER+]: Secondary | ICD-10-CM

## 2013-11-20 LAB — COMPREHENSIVE METABOLIC PANEL (CC13)
ALT: 60 U/L — AB (ref 0–55)
AST: 20 U/L (ref 5–34)
Albumin: 3.9 g/dL (ref 3.5–5.0)
Alkaline Phosphatase: 74 U/L (ref 40–150)
Anion Gap: 8 mEq/L (ref 3–11)
BUN: 12.5 mg/dL (ref 7.0–26.0)
CO2: 28 mEq/L (ref 22–29)
CREATININE: 1 mg/dL (ref 0.6–1.1)
Calcium: 9.3 mg/dL (ref 8.4–10.4)
Chloride: 106 mEq/L (ref 98–109)
Glucose: 106 mg/dl (ref 70–140)
Potassium: 4.1 mEq/L (ref 3.5–5.1)
Sodium: 142 mEq/L (ref 136–145)
Total Bilirubin: 0.32 mg/dL (ref 0.20–1.20)
Total Protein: 7.3 g/dL (ref 6.4–8.3)

## 2013-11-20 LAB — CBC WITH DIFFERENTIAL/PLATELET
BASO%: 0.4 % (ref 0.0–2.0)
BASOS ABS: 0 10*3/uL (ref 0.0–0.1)
EOS%: 0.2 % (ref 0.0–7.0)
Eosinophils Absolute: 0 10*3/uL (ref 0.0–0.5)
HCT: 38.7 % (ref 34.8–46.6)
HEMOGLOBIN: 12.9 g/dL (ref 11.6–15.9)
LYMPH%: 18.5 % (ref 14.0–49.7)
MCH: 31.3 pg (ref 25.1–34.0)
MCHC: 33.3 g/dL (ref 31.5–36.0)
MCV: 94 fL (ref 79.5–101.0)
MONO#: 0.5 10*3/uL (ref 0.1–0.9)
MONO%: 5.5 % (ref 0.0–14.0)
NEUT#: 7.4 10*3/uL — ABNORMAL HIGH (ref 1.5–6.5)
NEUT%: 75.4 % (ref 38.4–76.8)
Platelets: 251 10*3/uL (ref 145–400)
RBC: 4.12 10*6/uL (ref 3.70–5.45)
RDW: 12.4 % (ref 11.2–14.5)
WBC: 9.9 10*3/uL (ref 3.9–10.3)
lymph#: 1.8 10*3/uL (ref 0.9–3.3)

## 2013-11-20 MED ORDER — DOXORUBICIN HCL CHEMO IV INJECTION 2 MG/ML
60.0000 mg/m2 | Freq: Once | INTRAVENOUS | Status: AC
  Administered 2013-11-20: 108 mg via INTRAVENOUS
  Filled 2013-11-20: qty 54

## 2013-11-20 MED ORDER — PALONOSETRON HCL INJECTION 0.25 MG/5ML
0.2500 mg | Freq: Once | INTRAVENOUS | Status: AC
  Administered 2013-11-20: 0.25 mg via INTRAVENOUS

## 2013-11-20 MED ORDER — SODIUM CHLORIDE 0.9 % IV SOLN
Freq: Once | INTRAVENOUS | Status: AC
  Administered 2013-11-20: 15:00:00 via INTRAVENOUS

## 2013-11-20 MED ORDER — DEXAMETHASONE SODIUM PHOSPHATE 20 MG/5ML IJ SOLN
INTRAMUSCULAR | Status: AC
  Filled 2013-11-20: qty 5

## 2013-11-20 MED ORDER — DEXAMETHASONE SODIUM PHOSPHATE 20 MG/5ML IJ SOLN
12.0000 mg | Freq: Once | INTRAMUSCULAR | Status: AC
  Administered 2013-11-20: 12 mg via INTRAVENOUS

## 2013-11-20 MED ORDER — SODIUM CHLORIDE 0.9 % IJ SOLN
10.0000 mL | INTRAMUSCULAR | Status: DC | PRN
  Administered 2013-11-20: 10 mL
  Filled 2013-11-20: qty 10

## 2013-11-20 MED ORDER — CYCLOPHOSPHAMIDE CHEMO INJECTION 1 GM
600.0000 mg/m2 | Freq: Once | INTRAMUSCULAR | Status: AC
  Administered 2013-11-20: 1080 mg via INTRAVENOUS
  Filled 2013-11-20: qty 54

## 2013-11-20 MED ORDER — HEPARIN SOD (PORK) LOCK FLUSH 100 UNIT/ML IV SOLN
500.0000 [IU] | Freq: Once | INTRAVENOUS | Status: AC | PRN
  Administered 2013-11-20: 500 [IU]
  Filled 2013-11-20: qty 5

## 2013-11-20 MED ORDER — PALONOSETRON HCL INJECTION 0.25 MG/5ML
INTRAVENOUS | Status: AC
  Filled 2013-11-20: qty 5

## 2013-11-20 MED ORDER — SODIUM CHLORIDE 0.9 % IV SOLN
150.0000 mg | Freq: Once | INTRAVENOUS | Status: AC
  Administered 2013-11-20: 150 mg via INTRAVENOUS
  Filled 2013-11-20: qty 5

## 2013-11-20 NOTE — Progress Notes (Signed)
[name] [name]  [phone] [phone]     ID: [name] OB: [birth year]  MR#: [id number]  TWS#:[id number]  PCP: [name]., MD GYN:  [name] SU: Excell Seltzer OTHER MD: [name]  CHIEF COMPLAINT: Locally advanced breast cancer CURRENT TREATMENT: To start chemotherapy July 2015  BREAST CANCER HISTORY: From the original intake note 08/20/2013:  Making noted a change, like a small hard pea in her left axilla. She brought this to Dr. [name] attention and bilateral diagnostic mammography and left ultrasonography at the breast Center for 07/18/2013 showed a small area of asymmetry in the inner left breast measuring perhaps 5 mm which was not palpable by physical exam. In the right breast there was some loosely grouped calcifications spanning up to 0.7 cm. Ultrasound of the left breast confirmed an ill-defined area of shadowing at the 9:00 position measuring up to 1.3 cm. There was also a 4 mm circumscribed hypoechoic mass at the 10:00 position. This is felt to be probably benign. In the left axilla there were 2 lymph nodes with slightly increased cortical thickening, although the fatty hila were maintained. These were not related to the patient's feeling of a "pea like mass" in her left axilla, which had by then pretty much resolved.   On 08/07/2013 the patient underwent left breast needle core biopsy and this showed ([id number]) and invasive lobular carcinoma (E-cadherin negative) estrogen receptor 70% positive with strong staining intensity, progesterone receptor 70% positive and strong staining intensity, with a proliferation marker of 7% and no HER-2 amplification, the signals ratio being 0.94 in the number per cell 1.50.  Bilateral breast MRI 08/15/2013 found an irregular spiculated mass in the left breast measuring approximately 1.5 cm. There was no abnormal enhancement of the pectoralis. There were at least 2 left axillary lymph nodes and  showed focal cortical thickening. There was a single left internal mammary lymph node at the level of the biopsy-proven malignancy which was not pathologic in appearance. The right breast and axilla were benign.  The patient's subsequent history is as detailed below   INTERVAL HISTORY: [name] [name] is here today for followup of her stage III lobular breast cancer. Today is day 1 cycle 2 of 4 planned cycles of dose dense doxorubicin and cyclophosphamide, with Neulasta given day 2 for granulocyte support.   [name] is doing well today.  She is here prior to treatment.  She was prescribed Gabapentin last week for hot flashes, and she will start taking them this weekend.  She did have some pain related to Neulasta that began one week after her treatment.  She is planning on taking Claritin slightly longer following her next treatment.  She did take some Ibuprofen and it did relieve her pain.  Otherwise, she denies fevers, chills, nausea, vomiting, diarrhea, numbness/tingling, skin/mouth changes, or any further concerns.    REVIEW OF SYSTEMS: A 10 point review of systems was conducted and is otherwise negative except for what is noted above.      PAST MEDICAL HISTORY: Past Medical History  Diagnosis Date  . Palpitations 2009    Improved; Secondary to premature ventricular contractions. No problems since-pt states was caring for ailing father, anxiety  . Anxiety disorder     Mild: treated by her gynecologist with selective serotonin reuptake inhibitor  . Nonspecific abnormal electrocardiogram (ECG) (EKG) 2009  . GERD (gastroesophageal reflux disease)     rolaids  . Hot flashes   . Malignant neoplasm of breast (female), unspecified site   .  Family history of malignant neoplasm of breast   . Arthritis     hands and feet  . Wears glasses     PAST SURGICAL HISTORY: Past Surgical History  Procedure Laterality Date  . Thyroidectomy, partial    . Cesarean section    . Endometrial ablation    .  Dilation and curettage of uterus    . Laparoscopic assisted vaginal hysterectomy  02/07/2011    Procedure: LAPAROSCOPIC ASSISTED VAGINAL HYSTERECTOMY;  Surgeon: [name];  Location: [facility];  Service: Gynecology;  Laterality: N/A;  . Abdominal hysterectomy  02/07/2011    Procedure: HYSTERECTOMY ABDOMINAL;  Surgeon: [name];  Location: [facility];  Service: Gynecology;  Laterality: N/A;  . Breast lumpectomy with needle localization and axillary lymph node dissection Left 09/09/2013    Procedure: LEFT BREAST LUMPECTOMY WITH NEEDLE LOCALIZATION AND LEFT AXILLARY LYMPH NODE DISSECTION;  Surgeon: [name], MD;  Location: [facility];  Service: General;  Laterality: Left;  . Portacath placement Right 09/29/2013    Procedure: INSERTION PORT-A-CATH;  Surgeon: [name], MD;  Location: [facility];  Service: General;  Laterality: Right;    FAMILY HISTORY Family History  Problem Relation Age of Onset  . Angina Father   . Aortic aneurysm Father   . Dementia Father   . Arrhythmia Maternal Aunt     A fib  . Breast cancer Maternal Aunt 56    currently 80  . Hypertension Other   . Arrhythmia Other     Uncle- a fib  . Arrhythmia Maternal Aunt     A fib  . Breast cancer Maternal Aunt     dx 58s; currently 62s  . Breast cancer Mother     dx 24s. Died at 89  . Colon cancer Maternal Grandmother     dx 30s; died at 58  . Colon cancer Maternal Grandfather     dx 41s; died in 76s  . Cancer Paternal Uncle     unknown primary in early 66s  . Breast cancer Maternal Aunt     dx 51s; died in 45s  The patient's father died from heart disease in the setting of dementia at the age of 27. The patient's mother died at the age of 55. She had been diagnosed with breast cancer at the age of 58. The patient is a single child. Her mother had 4 sisters, 3 of whom had breast cancer diagnosed at the age of 5, 89, and 62. There is also history of colorectal cancer  on the maternal side of the family   GYNECOLOGIC HISTORY:   menarche age 35, first live birth age 25, the patient is [place] P2. She underwent a hysterectomy without salpingo-oophorectomy in 2012. She took birth control pills approximately 3 years remotely, with no significant complications   SOCIAL HISTORY:  The patient works as a Publishing copy. Her husband [name]         . Son [name] lives in [place] where he works as a Public relations account executive. Daughter Darrick Penna this in [place], where she works as an Engineering geologist in a [employer]. The patient has no grandchildren. She is a Information systems manager.    ADVANCED DIRECTIVES: Not in place   HEALTH MAINTENANCE: History  Substance Use Topics  . Smoking status: Never Smoker   . Smokeless tobacco: Not on file  . Alcohol Use: Yes     Comment: drinks 1-2 beers a week     Colonoscopy:2010   PAP:2012  Bone density:2012   Lipid panel:  No Known Allergies  Current Outpatient Prescriptions  Medication Sig Dispense Refill  . Cholecalciferol (VITAMIN D) 2000 UNITS tablet Take 2,000 Units by mouth daily.      Marland Kitchen lidocaine-prilocaine (EMLA) cream Apply 1 application topically as needed. Apply over port 1-2 hours before chemo, cover with plastic wrap  30 g  0  . LORazepam (ATIVAN) 0.5 MG tablet Take 1 tablet (0.5 mg total) by mouth at bedtime as needed (Nausea or vomiting).  30 tablet  0  . NON FORMULARY       . calcium carbonate-magnesium hydroxide (ROLAIDS) 334 MG CHEW Chew 1 tablet by mouth 2 (two) times daily as needed. For indigestion       . diclofenac (VOLTAREN) 75 MG EC tablet Take 75 mg by mouth as needed.      . gabapentin (NEURONTIN) 300 MG capsule Take 1 capsule (300 mg total) by mouth at bedtime.  30 capsule  6  . oxyCODONE (OXY IR/ROXICODONE) 5 MG immediate release tablet Take 1-2 tablets (5-10 mg total) by mouth every 6 (six) hours as needed.  30 tablet  0  . prochlorperazine (COMPAZINE) 10 MG tablet Take 1 tablet (10 mg total) by mouth every 6 (six) hours as  needed (Nausea or vomiting).  30 tablet  1   No current facility-administered medications for this visit.    OBJECTIVE: middle-aged white woman  Filed Vitals:   11/20/13 1408  BP: 128/74  Pulse: 86  Temp: 98.1 F (36.7 C)  Resp: 18     Body mass index is 24.22 kg/(m^2).    ECOG FS:1 - Symptomatic but completely ambulatory  GENERAL: Patient is a well appearing female in no acute distress HEENT:  Sclerae anicteric.  Oropharynx clear and moist. No ulcerations or evidence of oropharyngeal candidiasis. Neck is supple.  NODES:  No cervical, supraclavicular, or axillary lymphadenopathy palpated.  BREAST EXAM:  Deferred. LUNGS:  Clear to auscultation bilaterally.  No wheezes or rhonchi. HEART:  Regular rate and rhythm. No murmur appreciated. ABDOMEN:  Soft, nontender.  Positive, normoactive bowel sounds. No organomegaly palpated. MSK:  No focal spinal tenderness to palpation. Full range of motion bilaterally in the upper extremities. EXTREMITIES:  No peripheral edema.   SKIN:  Clear with no obvious rashes or skin changes. No nail dyscrasia. NEURO:  Nonfocal. Well oriented.  Appropriate affect.      LAB RESULTS:  CMP     Component Value Date/Time   NA 142 11/20/2013 1359   K 4.1 11/20/2013 1359   CO2 28 11/20/2013 1359   GLUCOSE 106 11/20/2013 1359   BUN 12.5 11/20/2013 1359   CREATININE 1.0 11/20/2013 1359   CALCIUM 9.3 11/20/2013 1359   PROT 7.3 11/20/2013 1359   ALBUMIN 3.9 11/20/2013 1359   AST 20 11/20/2013 1359   ALT 60* 11/20/2013 1359   ALKPHOS 74 11/20/2013 1359   BILITOT 0.32 11/20/2013 1359    I No results found for this basename: SPEP,  UPEP,   kappa and lambda light chains    Lab Results  Component Value Date   WBC 9.9 11/20/2013   NEUTROABS 7.4* 11/20/2013   HGB 12.9 11/20/2013   HCT 38.7 11/20/2013   MCV 94.0 11/20/2013   PLT 251 11/20/2013      Chemistry      Component Value Date/Time   NA 142 11/20/2013 1359   K 4.1 11/20/2013 1359   CO2 28 11/20/2013 1359    BUN 12.5 11/20/2013 1359  CREATININE 1.0 11/20/2013 1359      Component Value Date/Time   CALCIUM 9.3 11/20/2013 1359   ALKPHOS 74 11/20/2013 1359   AST 20 11/20/2013 1359   ALT 60* 11/20/2013 1359   BILITOT 0.32 11/20/2013 1359       No results found for this basename: LABCA2    No components found with this basename: LABCA125    No results found for this basename: INR,  in the last 168 hours  Urinalysis No results found for this basename: colorurine,  appearanceur,  labspec,  phurine,  glucoseu,  hgbur,  bilirubinur,  ketonesur,  proteinur,  urobilinogen,  nitrite,  leukocytesur    STUDIES: -No results found.  ASSESSMENT: 53 y.o. [place] woman status post left breast biopsy for 01/18/2014 showing invasive lobular carcinoma (E-cadherin negative), estrogen receptor 70% positive, progesterone receptor 70% positive, with an MIB-1 of 7%, and no HER-2 amplification  (1) ultrasound-guided biopsy of a suspicious lymph node in the left axilla 08/19/2012 was positive  (2) genetics testing may 2015 was normal and did not reveal a mutation in the genes tested: ATM, BARD1, BRCA1, BRCA2, BRIP1, CDH1, CHEK2, MRE11A, MUTYH, NBN, NF1, PALB2, PTEN, RAD50, RAD51C, RAD51D, and TP53.  (3) status post left lumpectomy and axillary lymph node dissection 09/09/2013 for a pT1c pN2, stage IIIa invasive lobular carcinoma, grade 1, repeat HER-2 again negative  (4) adjuvant chemotherapy will consist of doxorubicin and cyclophosphamide in dose dense fashion x4, with Neulasta support, followed by paclitaxel in dose dense fashion x4, again with Neulasta support  (5) radiation to follow chemotherapy  (6) antiestrogen therapy to follow radiation   PLAN:  Soundra is doing well today.  Her lab work is stable.  I reviewed it with her in detail.  She will proceed with her second cycle of doxorubicin and cyclophosphamide today.  She will take Claritin daily to help prevent the bone pain associated with Neulasta.     She will start taking Gabapentin for her hot flashes tonight.    Irais will return tomorrow for Neulasta, and in one week for labs and evaluation of chemotoxicities.   She knows to call us in the interim for any questions or concerns.  We can certainly see her sooner if needed.  I spent 25 minutes counseling the patient face to face.  The total time spent in the appointment was 30 minutes.   Minette Headland, [place] [phone]    11/20/2013 2:43 PM

## 2013-11-20 NOTE — Patient Instructions (Signed)
[facility] Discharge Instructions for Patients Receiving Chemotherapy  Today you received the following chemotherapy agents: Adriamycin, Cytoxan  To help prevent nausea and vomiting after your treatment, we encourage you to take your nausea medication as prescribed by your physician.   If you develop nausea and vomiting that is not controlled by your nausea medication, call the clinic.   BELOW ARE SYMPTOMS THAT SHOULD BE REPORTED IMMEDIATELY:  *FEVER GREATER THAN 100.5 F  *CHILLS WITH OR WITHOUT FEVER  NAUSEA AND VOMITING THAT IS NOT CONTROLLED WITH YOUR NAUSEA MEDICATION  *UNUSUAL SHORTNESS OF BREATH  *UNUSUAL BRUISING OR BLEEDING  TENDERNESS IN MOUTH AND THROAT WITH OR WITHOUT PRESENCE OF ULCERS  *URINARY PROBLEMS  *BOWEL PROBLEMS  UNUSUAL RASH Items with * indicate a potential emergency and should be followed up as soon as possible.  Feel free to call the clinic you have any questions or concerns. The clinic phone number is [phone].    

## 2013-11-21 ENCOUNTER — Ambulatory Visit (HOSPITAL_BASED_OUTPATIENT_CLINIC_OR_DEPARTMENT_OTHER): Payer: BC Managed Care – PPO

## 2013-11-21 VITALS — BP 134/72 | HR 91 | Temp 97.7°F

## 2013-11-21 DIAGNOSIS — C50919 Malignant neoplasm of unspecified site of unspecified female breast: Secondary | ICD-10-CM

## 2013-11-21 DIAGNOSIS — C50212 Malignant neoplasm of upper-inner quadrant of left female breast: Secondary | ICD-10-CM

## 2013-11-21 DIAGNOSIS — C773 Secondary and unspecified malignant neoplasm of axilla and upper limb lymph nodes: Secondary | ICD-10-CM

## 2013-11-21 DIAGNOSIS — Z5189 Encounter for other specified aftercare: Secondary | ICD-10-CM

## 2013-11-21 MED ORDER — PEGFILGRASTIM INJECTION 6 MG/0.6ML
6.0000 mg | Freq: Once | SUBCUTANEOUS | Status: AC
  Administered 2013-11-21: 6 mg via SUBCUTANEOUS
  Filled 2013-11-21: qty 0.6

## 2013-11-24 ENCOUNTER — Telehealth: Payer: Self-pay | Admitting: Oncology

## 2013-11-24 ENCOUNTER — Other Ambulatory Visit: Payer: Self-pay | Admitting: *Deleted

## 2013-11-24 NOTE — Telephone Encounter (Signed)
lmonvm for pt re appt for 7/31 and mailed schedule. other appts remain the same.

## 2013-11-28 ENCOUNTER — Telehealth: Payer: Self-pay | Admitting: *Deleted

## 2013-11-28 ENCOUNTER — Telehealth: Payer: Self-pay | Admitting: Hematology

## 2013-11-28 ENCOUNTER — Other Ambulatory Visit (HOSPITAL_BASED_OUTPATIENT_CLINIC_OR_DEPARTMENT_OTHER): Payer: BC Managed Care – PPO

## 2013-11-28 ENCOUNTER — Ambulatory Visit (HOSPITAL_BASED_OUTPATIENT_CLINIC_OR_DEPARTMENT_OTHER): Payer: BC Managed Care – PPO | Admitting: Hematology

## 2013-11-28 VITALS — BP 128/70 | HR 88 | Temp 98.2°F | Resp 18 | Ht 66.0 in | Wt 150.6 lb

## 2013-11-28 DIAGNOSIS — C50212 Malignant neoplasm of upper-inner quadrant of left female breast: Secondary | ICD-10-CM

## 2013-11-28 DIAGNOSIS — C50919 Malignant neoplasm of unspecified site of unspecified female breast: Secondary | ICD-10-CM

## 2013-11-28 DIAGNOSIS — C773 Secondary and unspecified malignant neoplasm of axilla and upper limb lymph nodes: Secondary | ICD-10-CM

## 2013-11-28 DIAGNOSIS — R7402 Elevation of levels of lactic acid dehydrogenase (LDH): Secondary | ICD-10-CM

## 2013-11-28 DIAGNOSIS — Z170 Estrogen receptor positive status [ER+]: Secondary | ICD-10-CM

## 2013-11-28 DIAGNOSIS — D702 Other drug-induced agranulocytosis: Secondary | ICD-10-CM

## 2013-11-28 DIAGNOSIS — R740 Nonspecific elevation of levels of transaminase and lactic acid dehydrogenase [LDH]: Secondary | ICD-10-CM

## 2013-11-28 DIAGNOSIS — D709 Neutropenia, unspecified: Secondary | ICD-10-CM

## 2013-11-28 DIAGNOSIS — F411 Generalized anxiety disorder: Secondary | ICD-10-CM

## 2013-11-28 LAB — COMPREHENSIVE METABOLIC PANEL (CC13)
ALT: 80 U/L — AB (ref 0–55)
AST: 25 U/L (ref 5–34)
Albumin: 3.4 g/dL — ABNORMAL LOW (ref 3.5–5.0)
Alkaline Phosphatase: 67 U/L (ref 40–150)
Anion Gap: 7 mEq/L (ref 3–11)
BILIRUBIN TOTAL: 0.33 mg/dL (ref 0.20–1.20)
BUN: 11.6 mg/dL (ref 7.0–26.0)
CO2: 28 mEq/L (ref 22–29)
CREATININE: 0.8 mg/dL (ref 0.6–1.1)
Calcium: 8.8 mg/dL (ref 8.4–10.4)
Chloride: 105 mEq/L (ref 98–109)
Glucose: 95 mg/dl (ref 70–140)
Potassium: 4.1 mEq/L (ref 3.5–5.1)
Sodium: 140 mEq/L (ref 136–145)
TOTAL PROTEIN: 6.4 g/dL (ref 6.4–8.3)

## 2013-11-28 LAB — CBC WITH DIFFERENTIAL/PLATELET
BASO%: 1 % (ref 0.0–2.0)
Basophils Absolute: 0 10*3/uL (ref 0.0–0.1)
EOS%: 0.3 % (ref 0.0–7.0)
Eosinophils Absolute: 0 10*3/uL (ref 0.0–0.5)
HCT: 35.4 % (ref 34.8–46.6)
HGB: 11.9 g/dL (ref 11.6–15.9)
LYMPH%: 37.1 % (ref 14.0–49.7)
MCH: 31.6 pg (ref 25.1–34.0)
MCHC: 33.7 g/dL (ref 31.5–36.0)
MCV: 93.8 fL (ref 79.5–101.0)
MONO#: 0.4 10*3/uL (ref 0.1–0.9)
MONO%: 17.4 % — AB (ref 0.0–14.0)
NEUT#: 0.9 10*3/uL — ABNORMAL LOW (ref 1.5–6.5)
NEUT%: 44.2 % (ref 38.4–76.8)
PLATELETS: 270 10*3/uL (ref 145–400)
RBC: 3.77 10*6/uL (ref 3.70–5.45)
RDW: 12.4 % (ref 11.2–14.5)
WBC: 2.1 10*3/uL — AB (ref 3.9–10.3)
lymph#: 0.8 10*3/uL — ABNORMAL LOW (ref 0.9–3.3)

## 2013-11-28 NOTE — Telephone Encounter (Signed)
Per [facility] staff phone call scheduled appts. Advised schedulers 

## 2013-11-28 NOTE — Telephone Encounter (Signed)
per [facility] to sch trmt-Lakeisha sch trmt-pt aware of sch

## 2013-11-30 ENCOUNTER — Encounter: Payer: Self-pay | Admitting: Hematology

## 2013-11-30 DIAGNOSIS — D702 Other drug-induced agranulocytosis: Secondary | ICD-10-CM | POA: Insufficient documentation

## 2013-11-30 NOTE — Progress Notes (Signed)
[name] [name]  [phone] [phone]     ID: [name] [name] OB: 04-04-1961  MR#: [id number]  JOA#:[id number]  PCP: [name]., MD GYN:  [name] [name] SU: Excell Seltzer OTHER MD: [name]  CHIEF COMPLAINT: Locally advanced breast cancer for chemotherapy f/u. CURRENT TREATMENT: Adjuvant Chemotherapy Cytoxan/Adriamycin s/p 2 cycles.  BREAST CANCER HISTORY:  [name] [name] is 53 years old and she first noted a change, like a small hard pea in her left axilla. She brought this to Dr. [name] attention and bilateral diagnostic mammography and left ultrasonography at the [facility] Center for 07/18/2013 showed a small area of asymmetry in the inner left breast measuring perhaps 5 mm which was not palpable by physical exam. In the right breast there was some loosely grouped calcifications spanning up to 0.7 cm. Ultrasound of the left breast confirmed an ill-defined area of shadowing at the 9:00 position measuring up to 1.3 cm. There was also a 4 mm circumscribed hypoechoic mass at the 10:00 position. This is felt to be probably benign. In the left axilla there were 2 lymph nodes with slightly increased cortical thickening, although the fatty hila were maintained. These were not related to the patient's feeling of a "pea like mass" in her left axilla, which had by then pretty much resolved.   On 08/07/2013 the patient underwent left breast needle core biopsy and this showed ([id number]) and invasive lobular carcinoma (E-cadherin negative) estrogen receptor 70% positive with strong staining intensity, progesterone receptor 70% positive and strong staining intensity, with a proliferation marker of 7% and no HER-2 amplification, the signals ratio being 0.94 in the number per cell 1.50.  Bilateral breast MRI 08/15/2013 found an irregular spiculated mass in the left breast measuring approximately 1.5 cm. There was no abnormal enhancement of the pectoralis. There were at least 2  left axillary lymph nodes and showed focal cortical thickening. There was a single left internal mammary lymph node at the level of the biopsy-proven malignancy which was not pathologic in appearance. The right breast and axilla were benign.  Genetics testing may 2015 was normal and did not reveal a mutation in the genes tested: ATM, BARD1, BRCA1, BRCA2, BRIP1, CDH1, CHEK2, MRE11A, MUTYH, NBN, NF1, PALB2, PTEN, RAD50, RAD51C, RAD51D, and TP53. She is status post left lumpectomy and axillary lymph node dissection by Dr Excell Seltzer 09/09/2013 for a pT1c pN2, stage IIIa invasive lobular carcinoma, grade 1, repeat HER-2 again negative. Patient had 8/23 LN positive and also evidence of LVI (lymphovascular invasion) and PNI (perineural invasion).  Adjuvant chemotherapy was started with Doxorubicin and Cyclophosphamide in dose dense fashion with planned 4 cycles, with Neulasta support, followed by paclitaxel in dose dense fashion x4, again with Neulasta support. Patient has now completed 2/4 planned cycles of AC regimen given on 11/06/13 and 11/20/13.Radiation will follow chemotherapy and antiestrogen therapy to follow radiation.   INTERVAL HISTORY:  [name] is here today for followup of her stage III lobular breast cancer. Today, she is here for follow up after her second cycle AC being given on 7/23 and neulasta injection on 7/24.  Biggest side effect she noticed is fatigue and asthenia grade I-II. She is still working full time. She has a slight elevation in her ALT related to use of chemotherapy and when I asked her, she denies drinking alcohol, taking tylenol os statins or eating mushrooms which can also effect ALT/AST. Her BRCA testing was negative.She felt little queasy but no major nausea or vomiting. Patient is s/p hysterecomy in  2012 for her fibroids and postmenopausal for many years.       REVIEW OF SYSTEMS: A 10 point review of systems was conducted and is otherwise negative except for what is noted  above.     PAST MEDICAL HISTORY: Past Medical History  Diagnosis Date  . Palpitations 2009    Improved; Secondary to premature ventricular contractions. No problems since-pt states was caring for ailing father, anxiety  . Anxiety disorder     Mild: treated by her gynecologist with selective serotonin reuptake inhibitor  . Nonspecific abnormal electrocardiogram (ECG) (EKG) 2009  . GERD (gastroesophageal reflux disease)     rolaids  . Hot flashes   . Malignant neoplasm of breast (female), unspecified site   . Family history of malignant neoplasm of breast   . Arthritis     hands and feet  . Wears glasses     PAST SURGICAL HISTORY: Past Surgical History  Procedure Laterality Date  . Thyroidectomy, partial    . Cesarean section    . Endometrial ablation    . Dilation and curettage of uterus    . Laparoscopic assisted vaginal hysterectomy  02/07/2011    Procedure: LAPAROSCOPIC ASSISTED VAGINAL HYSTERECTOMY;  Surgeon: [name] [name];  Location: [facility];  Service: Gynecology;  Laterality: N/A;  . Abdominal hysterectomy  02/07/2011    Procedure: HYSTERECTOMY ABDOMINAL;  Surgeon: [name] [name];  Location: [facility];  Service: Gynecology;  Laterality: N/A;  . Breast lumpectomy with needle localization and axillary lymph node dissection Left 09/09/2013    Procedure: LEFT BREAST LUMPECTOMY WITH NEEDLE LOCALIZATION AND LEFT AXILLARY LYMPH NODE DISSECTION;  Surgeon: [name], MD;  Location: [facility];  Service: General;  Laterality: Left;  . Portacath placement Right 09/29/2013    Procedure: INSERTION PORT-A-CATH;  Surgeon: [name], MD;  Location: [facility];  Service: General;  Laterality: Right;    FAMILY HISTORY Family History  Problem Relation Age of Onset  . Angina Father   . Aortic aneurysm Father   . Dementia Father   . Arrhythmia Maternal Aunt     A fib  . Breast cancer Maternal Aunt 56    currently 21  . Hypertension Other     . Arrhythmia Other     Uncle- a fib  . Arrhythmia Maternal Aunt     A fib  . Breast cancer Maternal Aunt     dx 57s; currently 37s  . Breast cancer Mother     dx 45s. Died at 41  . Colon cancer Maternal Grandmother     dx 19s; died at 33  . Colon cancer Maternal Grandfather     dx 69s; died in 65s  . Cancer Paternal Uncle     unknown primary in early 66s  . Breast cancer Maternal Aunt     dx 36s; died in 66s  The patient's father died from heart disease in the setting of dementia at the age of 32. The patient's mother died at the age of 60. She had been diagnosed with breast cancer at the age of 4. The patient is a single child. Her mother had 4 sisters, 3 of whom had breast cancer diagnosed at the age of 70, 38, and 16. There is also history of colorectal cancer on the maternal side of the family. BRCA testing negative.  GYNECOLOGIC HISTORY:   menarche age 62, first live birth age 71, the patient is [place] P2. She underwent a hysterectomy without salpingo-oophorectomy in 2012.  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 Her husband [name]. Son [name] lives in [place] where he works as a Public relations account executive. Daughter Darrick Penna this in [place], where she works as an Engineering geologist in a [employer]. The patient has no grandchildren. She is a Information systems manager.   ADVANCED DIRECTIVES: Not in place   HEALTH MAINTENANCE: History  Substance Use Topics  . Smoking status: Never Smoker   . Smokeless tobacco: Not on file  . Alcohol Use: Yes     Comment: drinks 1-2 beers a week     Colonoscopy:2010   PAP:2012, s/p hysterectomy.  Bone density:2012   Lipid panel: 11/05/2013 TC 219, HDL 41, LDL 137, TG 204  No Known Allergies  Current Outpatient Prescriptions  Medication Sig Dispense Refill  . calcium carbonate-magnesium hydroxide (ROLAIDS) 334 MG CHEW Chew 1 tablet by mouth 2  (two) times daily as needed. For indigestion       . Cholecalciferol (VITAMIN D) 2000 UNITS tablet Take 2,000 Units by mouth daily.      Marland Kitchen gabapentin (NEURONTIN) 300 MG capsule Take 1 capsule (300 mg total) by mouth at bedtime.  30 capsule  6  . lidocaine-prilocaine (EMLA) cream Apply 1 application topically as needed. Apply over port 1-2 hours before chemo, cover with plastic wrap  30 g  0  . LORazepam (ATIVAN) 0.5 MG tablet Take 1 tablet (0.5 mg total) by mouth at bedtime as needed (Nausea or vomiting).  30 tablet  0  . NON FORMULARY       . oxyCODONE (OXY IR/ROXICODONE) 5 MG immediate release tablet Take 1-2 tablets (5-10 mg total) by mouth every 6 (six) hours as needed.  30 tablet  0  . prochlorperazine (COMPAZINE) 10 MG tablet Take 1 tablet (10 mg total) by mouth every 6 (six) hours as needed (Nausea or vomiting).  30 tablet  1  . diclofenac (VOLTAREN) 75 MG EC tablet Take 75 mg by mouth as needed.       No current facility-administered medications for this visit.    OBJECTIVE: middle-aged white woman  Filed Vitals:   11/28/13 1020  BP: 128/70  Pulse: 88  Temp: 98.2 F (36.8 C)  Resp: 18     Body mass index is 24.32 kg/(m^2).    ECOG FS:1 - Symptomatic but completely ambulatory  GENERAL: Patient is a well appearing female in no acute distress HEENT:  Sclerae anicteric.  Oropharynx clear and moist. No ulcerations or evidence of oropharyngeal candidiasis. Neck is supple.  NODES:  No cervical, supraclavicular, or axillary lymphadenopathy palpated.  BREAST EXAM:  Deferred. LUNGS:  Clear to auscultation bilaterally.  No wheezes or rhonchi. HEART:  Regular rate and rhythm. No murmur appreciated. ABDOMEN:  Soft, nontender.  Positive, normoactive bowel sounds. No organomegaly palpated. MSK:  No focal spinal tenderness to palpation. Full range of motion bilaterally in the upper extremities. EXTREMITIES:  No peripheral edema.   SKIN:  Clear with no obvious rashes or skin changes. No nail  dyscrasia. NEURO:  Nonfocal. Well oriented.  Appropriate affect.      LAB RESULTS:  CMP     Component Value Date/Time   NA 140 11/28/2013 1011   K 4.1 11/28/2013 1011   CO2 28 11/28/2013 1011   GLUCOSE 95 11/28/2013 1011   BUN 11.6 11/28/2013 1011   CREATININE 0.8 11/28/2013 1011   CALCIUM 8.8 11/28/2013  1011   PROT 6.4 11/28/2013 1011   ALBUMIN 3.4* 11/28/2013 1011   AST 25 11/28/2013 1011   ALT 80* 11/28/2013 1011   ALKPHOS 67 11/28/2013 1011   BILITOT 0.33 11/28/2013 1011    I No results found for this basename: SPEP,  UPEP,   kappa and lambda light chains    Lab Results  Component Value Date   WBC 2.1* 11/28/2013   NEUTROABS 0.9* 11/28/2013   HGB 11.9 11/28/2013   HCT 35.4 11/28/2013   MCV 93.8 11/28/2013   PLT 270 11/28/2013      Chemistry      Component Value Date/Time   NA 140 11/28/2013 1011   K 4.1 11/28/2013 1011   CO2 28 11/28/2013 1011   BUN 11.6 11/28/2013 1011   CREATININE 0.8 11/28/2013 1011      Component Value Date/Time   CALCIUM 8.8 11/28/2013 1011   ALKPHOS 67 11/28/2013 1011   AST 25 11/28/2013 1011   ALT 80* 11/28/2013 1011   BILITOT 0.33 11/28/2013 1011      STUDIES:  ECHO 09/30/13 EF 39%, grade 2 diastolic dysfunction.  ASSESSMENT: 52 y.o. BRCA negative [place] woman status post left breast biopsy for 01/18/2014 showing invasive lobular carcinoma (E-cadherin negative), estrogen receptor 70% positive, progesterone receptor 70% positive, with an MIB-1 of 7%, and no HER-2 amplification  (1) ultrasound-guided biopsy of a suspicious lymph node in the left axilla 08/19/2012 was positive  (2) status post left lumpectomy and axillary lymph node dissection 09/09/2013 for a pT1c pN2, stage IIIa invasive lobular carcinoma, grade 1, repeat HER-2 again negative. She has 8/23 LN +, LVI+,PNI+.  (4) Adjuvant chemotherapy consists of doxorubicin and cyclophosphamide in dose dense fashion x4, with Neulasta support, followed by paclitaxel in dose dense fashion x4, again  with Neulasta support. She has completed first 2 cycles of AC regimen, cycle 2 given on 7/23 and neulasta on 11/21/13.  (5) radiation to follow chemotherapy  (6) antiestrogen therapy to follow radiation   PLAN:  1. Allien is doing well today.  Her lab work is stable.  I reviewed it with her in detail.  She will proceed with her third cycle of doxorubicin and cyclophosphamide on 12/04/13.  She will take Claritin daily to help prevent the bone pain associated with Neulasta and it did help her this time. There was some discussion today that when it comes to Paclitaxel part of chemotherapy, we can use a weekly regimen x 12 weeks to avoid the Neulasta related discomfort and she is agreeable to that although she knows it can extend her chemo duration by 1 month.  2. Her physical exam and labs were normal except with mild elevation of ALT and we will continue to monitor that. It is secondary to chemotherapy.    3. Patient is neutropenic and neutropenic precautions were discussed with patient. Overall she is doing well and maintaining a positive frame of mind.  I spent 25 minutes counseling the patient face to face.  The total time spent in the appointment was 30 minutes.  Bernadene Bell, MD Medical Hematologist/Oncologist [place] Pager: [phone] Office No: [phone]

## 2013-12-03 ENCOUNTER — Encounter: Payer: Self-pay | Admitting: Hematology

## 2013-12-03 ENCOUNTER — Encounter: Payer: Self-pay | Admitting: *Deleted

## 2013-12-03 NOTE — Progress Notes (Signed)
Called and left the message for the patient she needs to make sure no referral/pre Josem Kaufmann is needed for our visit. I advised her we are a specialist and make sure she brings her new card in. She said no longer have [employer].

## 2013-12-04 ENCOUNTER — Other Ambulatory Visit (HOSPITAL_BASED_OUTPATIENT_CLINIC_OR_DEPARTMENT_OTHER): Payer: BC Managed Care – PPO

## 2013-12-04 ENCOUNTER — Other Ambulatory Visit: Payer: Self-pay | Admitting: Oncology

## 2013-12-04 ENCOUNTER — Encounter: Payer: Self-pay | Admitting: Adult Health

## 2013-12-04 ENCOUNTER — Ambulatory Visit (HOSPITAL_BASED_OUTPATIENT_CLINIC_OR_DEPARTMENT_OTHER): Payer: BC Managed Care – PPO

## 2013-12-04 ENCOUNTER — Ambulatory Visit (HOSPITAL_BASED_OUTPATIENT_CLINIC_OR_DEPARTMENT_OTHER): Payer: BC Managed Care – PPO | Admitting: Adult Health

## 2013-12-04 VITALS — BP 122/74 | HR 92 | Temp 98.3°F | Resp 18 | Ht 66.0 in | Wt 150.5 lb

## 2013-12-04 DIAGNOSIS — C50919 Malignant neoplasm of unspecified site of unspecified female breast: Secondary | ICD-10-CM

## 2013-12-04 DIAGNOSIS — F411 Generalized anxiety disorder: Secondary | ICD-10-CM

## 2013-12-04 DIAGNOSIS — Z5111 Encounter for antineoplastic chemotherapy: Secondary | ICD-10-CM

## 2013-12-04 DIAGNOSIS — C50212 Malignant neoplasm of upper-inner quadrant of left female breast: Secondary | ICD-10-CM

## 2013-12-04 DIAGNOSIS — C773 Secondary and unspecified malignant neoplasm of axilla and upper limb lymph nodes: Secondary | ICD-10-CM

## 2013-12-04 DIAGNOSIS — Z170 Estrogen receptor positive status [ER+]: Secondary | ICD-10-CM

## 2013-12-04 LAB — CBC WITH DIFFERENTIAL/PLATELET
BASO%: 0.5 % (ref 0.0–2.0)
BASOS ABS: 0.1 10*3/uL (ref 0.0–0.1)
EOS%: 0.1 % (ref 0.0–7.0)
Eosinophils Absolute: 0 10*3/uL (ref 0.0–0.5)
HEMATOCRIT: 35.9 % (ref 34.8–46.6)
HEMOGLOBIN: 12.1 g/dL (ref 11.6–15.9)
LYMPH%: 10.2 % — AB (ref 14.0–49.7)
MCH: 31.6 pg (ref 25.1–34.0)
MCHC: 33.6 g/dL (ref 31.5–36.0)
MCV: 93.9 fL (ref 79.5–101.0)
MONO#: 0.5 10*3/uL (ref 0.1–0.9)
MONO%: 4.6 % (ref 0.0–14.0)
NEUT#: 9.8 10*3/uL — ABNORMAL HIGH (ref 1.5–6.5)
NEUT%: 84.6 % — ABNORMAL HIGH (ref 38.4–76.8)
PLATELETS: 197 10*3/uL (ref 145–400)
RBC: 3.82 10*6/uL (ref 3.70–5.45)
RDW: 13 % (ref 11.2–14.5)
WBC: 11.6 10*3/uL — ABNORMAL HIGH (ref 3.9–10.3)
lymph#: 1.2 10*3/uL (ref 0.9–3.3)

## 2013-12-04 LAB — COMPREHENSIVE METABOLIC PANEL (CC13)
ALT: 47 U/L (ref 0–55)
AST: 19 U/L (ref 5–34)
Albumin: 3.8 g/dL (ref 3.5–5.0)
Alkaline Phosphatase: 68 U/L (ref 40–150)
Anion Gap: 9 mEq/L (ref 3–11)
BILIRUBIN TOTAL: 0.48 mg/dL (ref 0.20–1.20)
BUN: 10.6 mg/dL (ref 7.0–26.0)
CO2: 27 meq/L (ref 22–29)
Calcium: 9.2 mg/dL (ref 8.4–10.4)
Chloride: 106 mEq/L (ref 98–109)
Creatinine: 1.1 mg/dL (ref 0.6–1.1)
Glucose: 92 mg/dl (ref 70–140)
Potassium: 4 mEq/L (ref 3.5–5.1)
Sodium: 141 mEq/L (ref 136–145)
Total Protein: 7 g/dL (ref 6.4–8.3)

## 2013-12-04 MED ORDER — DEXAMETHASONE SODIUM PHOSPHATE 20 MG/5ML IJ SOLN
12.0000 mg | Freq: Once | INTRAMUSCULAR | Status: AC
  Administered 2013-12-04: 12 mg via INTRAVENOUS

## 2013-12-04 MED ORDER — PALONOSETRON HCL INJECTION 0.25 MG/5ML
0.2500 mg | Freq: Once | INTRAVENOUS | Status: AC
  Administered 2013-12-04: 0.25 mg via INTRAVENOUS

## 2013-12-04 MED ORDER — SODIUM CHLORIDE 0.9 % IJ SOLN
10.0000 mL | INTRAMUSCULAR | Status: DC | PRN
  Administered 2013-12-04: 10 mL
  Filled 2013-12-04: qty 10

## 2013-12-04 MED ORDER — SODIUM CHLORIDE 0.9 % IV SOLN
Freq: Once | INTRAVENOUS | Status: AC
  Administered 2013-12-04: 15:00:00 via INTRAVENOUS

## 2013-12-04 MED ORDER — CYCLOPHOSPHAMIDE CHEMO INJECTION 1 GM
600.0000 mg/m2 | Freq: Once | INTRAMUSCULAR | Status: AC
  Administered 2013-12-04: 1080 mg via INTRAVENOUS
  Filled 2013-12-04: qty 54

## 2013-12-04 MED ORDER — HEPARIN SOD (PORK) LOCK FLUSH 100 UNIT/ML IV SOLN
500.0000 [IU] | Freq: Once | INTRAVENOUS | Status: AC | PRN
  Administered 2013-12-04: 500 [IU]
  Filled 2013-12-04: qty 5

## 2013-12-04 MED ORDER — SODIUM CHLORIDE 0.9 % IV SOLN
150.0000 mg | Freq: Once | INTRAVENOUS | Status: AC
  Administered 2013-12-04: 150 mg via INTRAVENOUS
  Filled 2013-12-04: qty 5

## 2013-12-04 MED ORDER — DOXORUBICIN HCL CHEMO IV INJECTION 2 MG/ML
60.0000 mg/m2 | Freq: Once | INTRAVENOUS | Status: AC
  Administered 2013-12-04: 108 mg via INTRAVENOUS
  Filled 2013-12-04: qty 54

## 2013-12-04 MED ORDER — PALONOSETRON HCL INJECTION 0.25 MG/5ML
INTRAVENOUS | Status: AC
  Filled 2013-12-04: qty 5

## 2013-12-04 MED ORDER — DEXAMETHASONE SODIUM PHOSPHATE 20 MG/5ML IJ SOLN
INTRAMUSCULAR | Status: AC
  Filled 2013-12-04: qty 5

## 2013-12-04 NOTE — Progress Notes (Signed)
[place]  [phone] [phone]     ID: [name] [name] OB: October 24, 1960  MR#: [id number]  PFX#:[id number]  PCP: [name]., MD GYN:  [name] SU: Excell Seltzer OTHER MD: [name]  CHIEF COMPLAINT: Locally advanced breast cancer for chemotherapy f/u. CURRENT TREATMENT: adjuvant chemotherapy  BREAST CANCER HISTORY:  [name] [name] is 53 years old and she first noted a change, like a small hard pea in her left axilla. She brought this to Dr. [name] attention and bilateral diagnostic mammography and left ultrasonography at the breast Center for 07/18/2013 showed a small area of asymmetry in the inner left breast measuring perhaps 5 mm which was not palpable by physical exam. In the right breast there was some loosely grouped calcifications spanning up to 0.7 cm. Ultrasound of the left breast confirmed an ill-defined area of shadowing at the 9:00 position measuring up to 1.3 cm. There was also a 4 mm circumscribed hypoechoic mass at the 10:00 position. This is felt to be probably benign. In the left axilla there were 2 lymph nodes with slightly increased cortical thickening, although the fatty hila were maintained. These were not related to the patient's feeling of a "pea like mass" in her left axilla, which had by then pretty much resolved.   On 08/07/2013 the patient underwent left breast needle core biopsy and this showed ([id number]) and invasive lobular carcinoma (E-cadherin negative) estrogen receptor 70% positive with strong staining intensity, progesterone receptor 70% positive and strong staining intensity, with a proliferation marker of 7% and no HER-2 amplification, the signals ratio being 0.94 in the number per cell 1.50.  Bilateral breast MRI 08/15/2013 found an irregular spiculated mass in the left breast measuring approximately 1.5 cm. There was no abnormal enhancement of the pectoralis. There were at least 2 left axillary lymph nodes and showed  focal cortical thickening. There was a single left internal mammary lymph node at the level of the biopsy-proven malignancy which was not pathologic in appearance. The right breast and axilla were benign.  Genetics testing may 2015 was normal and did not reveal a mutation in the genes tested: ATM, BARD1, BRCA1, BRCA2, BRIP1, CDH1, CHEK2, MRE11A, MUTYH, NBN, NF1, PALB2, PTEN, RAD50, RAD51C, RAD51D, and TP53. She is status post left lumpectomy and axillary lymph node dissection by Dr Excell Seltzer 09/09/2013 for a pT1c pN2, stage IIIa invasive lobular carcinoma, grade 1, repeat HER-2 again negative. Patient had 8/23 LN positive and also evidence of LVI (lymphovascular invasion) and PNI (perineural invasion).  INTERVAL HISTORY:  [name] [name] is here today for evaluation prior to receiving Doxorubicin and cyclophosphamide.  She receives treatment on day 1 of a 14 day cycle with Neulasta given on day 2 for granulocyte support.  A total of four cycles are planned.    [name] [name] is here for evaluation prior to receiving cycle 3 of Doxorubicin and Cyclophosphamide.  She is doing well today.  She denies fevers, chills, nausea, vomiting, constipation,diarrhea, numbness/tingling or any further concerns.   REVIEW OF SYSTEMS: A 10 point review of systems was conducted and is otherwise negative except for what is noted above.     PAST MEDICAL HISTORY: Past Medical History  Diagnosis Date  . Palpitations 2009    Improved; Secondary to premature ventricular contractions. No problems since-pt states was caring for ailing father, anxiety  . Anxiety disorder     Mild: treated by her gynecologist with selective serotonin reuptake inhibitor  . Nonspecific abnormal electrocardiogram (ECG) (EKG) 2009  . GERD (gastroesophageal  reflux disease)     rolaids  . Hot flashes   . Malignant neoplasm of breast (female), unspecified site   . Family history of malignant neoplasm of breast   . Arthritis     hands and feet  . Wears glasses      PAST SURGICAL HISTORY: Past Surgical History  Procedure Laterality Date  . Thyroidectomy, partial    . Cesarean section    . Endometrial ablation    . Dilation and curettage of uterus    . Laparoscopic assisted vaginal hysterectomy  02/07/2011    Procedure: LAPAROSCOPIC ASSISTED VAGINAL HYSTERECTOMY;  Surgeon: [name];  Location: [facility];  Service: Gynecology;  Laterality: N/A;  . Abdominal hysterectomy  02/07/2011    Procedure: HYSTERECTOMY ABDOMINAL;  Surgeon: [name];  Location: [facility];  Service: Gynecology;  Laterality: N/A;  . Breast lumpectomy with needle localization and axillary lymph node dissection Left 09/09/2013    Procedure: LEFT BREAST LUMPECTOMY WITH NEEDLE LOCALIZATION AND LEFT AXILLARY LYMPH NODE DISSECTION;  Surgeon: [name], MD;  Location: [facility];  Service: General;  Laterality: Left;  . Portacath placement Right 09/29/2013    Procedure: INSERTION PORT-A-CATH;  Surgeon: [name], MD;  Location: [facility];  Service: General;  Laterality: Right;    FAMILY HISTORY Family History  Problem Relation Age of Onset  . Angina Father   . Aortic aneurysm Father   . Dementia Father   . Arrhythmia Maternal Aunt     A fib  . Breast cancer Maternal Aunt 56    currently 69  . Hypertension Other   . Arrhythmia Other     Uncle- a fib  . Arrhythmia Maternal Aunt     A fib  . Breast cancer Maternal Aunt     dx 2s; currently 64s  . Breast cancer Mother     dx 73s. Died at 62  . Colon cancer Maternal Grandmother     dx 67s; died at 6  . Colon cancer Maternal Grandfather     dx 91s; died in 57s  . Cancer Paternal Uncle     unknown primary in early 23s  . Breast cancer Maternal Aunt     dx 43s; died in 63s  The patient's father died from heart disease in the setting of dementia at the age of 23. The patient's mother died at the age of 63. She had been diagnosed with breast cancer at the age of 32. The  patient is a single child. Her mother had 4 sisters, 3 of whom had breast cancer diagnosed at the age of 37, 18, and 75. There is also history of colorectal cancer on the maternal side of the family. BRCA testing negative.  GYNECOLOGIC HISTORY:   menarche age 18, first live birth age 53, the patient is [place] P2. She underwent a hysterectomy without salpingo-oophorectomy in 2012. 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 Her husband [name]. Son [name] lives in [place] where he works as a Public relations account executive. Daughter Darrick Penna this in [place], where she works as an Engineering geologist in a [employer]. The patient has no grandchildren. She is a Information systems manager.   ADVANCED DIRECTIVES: Not in place   HEALTH MAINTENANCE: History  Substance Use Topics  . Smoking status: Never Smoker   . Smokeless tobacco: Not on file  . Alcohol Use:  Yes     Comment: drinks 1-2 beers a week     Colonoscopy:2010   PAP:2012, s/p hysterectomy.  Bone density:2012   Lipid panel: 11/05/2013 TC 219, HDL 41, LDL 137, TG 204  No Known Allergies  Current Outpatient Prescriptions  Medication Sig Dispense Refill  . calcium carbonate-magnesium hydroxide (ROLAIDS) 334 MG CHEW Chew 1 tablet by mouth 2 (two) times daily as needed. For indigestion       . Cholecalciferol (VITAMIN D) 2000 UNITS tablet Take 2,000 Units by mouth daily.      . diclofenac (VOLTAREN) 75 MG EC tablet Take 75 mg by mouth as needed.      . gabapentin (NEURONTIN) 300 MG capsule Take 1 capsule (300 mg total) by mouth at bedtime.  30 capsule  6  . lidocaine-prilocaine (EMLA) cream Apply 1 application topically as needed. Apply over port 1-2 hours before chemo, cover with plastic wrap  30 g  0  . LORazepam (ATIVAN) 0.5 MG tablet Take 1 tablet (0.5 mg total) by mouth at bedtime as needed (Nausea or vomiting).  30 tablet  0  . NON FORMULARY         . oxyCODONE (OXY IR/ROXICODONE) 5 MG immediate release tablet Take 1-2 tablets (5-10 mg total) by mouth every 6 (six) hours as needed.  30 tablet  0  . prochlorperazine (COMPAZINE) 10 MG tablet Take 1 tablet (10 mg total) by mouth every 6 (six) hours as needed (Nausea or vomiting).  30 tablet  1   No current facility-administered medications for this visit.    OBJECTIVE: middle-aged white woman  Filed Vitals:   12/04/13 1354  BP: 122/74  Pulse: 92  Temp: 98.3 F (36.8 C)  Resp: 18     Body mass index is 24.3 kg/(m^2).    ECOG FS:1 - Symptomatic but completely ambulatory  GENERAL: Patient is a well appearing female in no acute distress HEENT:  Sclerae anicteric.  Oropharynx clear and moist. No ulcerations or evidence of oropharyngeal candidiasis. Neck is supple.  NODES:  No cervical, supraclavicular, or axillary lymphadenopathy palpated.  BREAST EXAM:  Deferred. LUNGS:  Clear to auscultation bilaterally.  No wheezes or rhonchi. HEART:  Regular rate and rhythm. No murmur appreciated. ABDOMEN:  Soft, nontender.  Positive, normoactive bowel sounds. No organomegaly palpated. MSK:  No focal spinal tenderness to palpation. Full range of motion bilaterally in the upper extremities. EXTREMITIES:  No peripheral edema.   SKIN:  Clear with no obvious rashes or skin changes. No nail dyscrasia. NEURO:  Nonfocal. Well oriented.  Appropriate affect.      LAB RESULTS:  CMP     Component Value Date/Time   NA 141 12/04/2013 1336   K 4.0 12/04/2013 1336   CO2 27 12/04/2013 1336   GLUCOSE 92 12/04/2013 1336   BUN 10.6 12/04/2013 1336   CREATININE 1.1 12/04/2013 1336   CALCIUM 9.2 12/04/2013 1336   PROT 7.0 12/04/2013 1336   ALBUMIN 3.8 12/04/2013 1336   AST 19 12/04/2013 1336   ALT 47 12/04/2013 1336   ALKPHOS 68 12/04/2013 1336   BILITOT 0.48 12/04/2013 1336    I No results found for this basename: SPEP,  UPEP,   kappa and lambda light chains    Lab Results  Component Value Date   WBC 11.6* 12/04/2013    NEUTROABS 9.8* 12/04/2013   HGB 12.1 12/04/2013   HCT 35.9 12/04/2013   MCV 93.9 12/04/2013   PLT 197 12/04/2013      Chemistry  Component Value Date/Time   NA 141 12/04/2013 1336   K 4.0 12/04/2013 1336   CO2 27 12/04/2013 1336   BUN 10.6 12/04/2013 1336   CREATININE 1.1 12/04/2013 1336      Component Value Date/Time   CALCIUM 9.2 12/04/2013 1336   ALKPHOS 68 12/04/2013 1336   AST 19 12/04/2013 1336   ALT 47 12/04/2013 1336   BILITOT 0.48 12/04/2013 1336      STUDIES:  ECHO 09/30/13 EF 96%, grade 2 diastolic dysfunction.  ASSESSMENT: 52 y.o. BRCA negative [place] woman status post left breast biopsy for 01/18/2014 showing invasive lobular carcinoma (E-cadherin negative), estrogen receptor 70% positive, progesterone receptor 70% positive, with an MIB-1 of 7%, and no HER-2 amplification  (1) ultrasound-guided biopsy of a suspicious lymph node in the left axilla 08/19/2012 was positive  (2) status post left lumpectomy and axillary lymph node dissection 09/09/2013 for a pT1c pN2, stage IIIa invasive lobular carcinoma, grade 1, repeat HER-2 again negative. She has 8/23 LN +, LVI+,PNI+.  (4) Adjuvant chemotherapy consists of doxorubicin and cyclophosphamide in dose dense fashion x4, with Neulasta support, followed by paclitaxel in dose dense fashion x4, again with Neulasta support.   (5) radiation to follow chemotherapy  (6) antiestrogen therapy to follow radiation   PLAN:  Posie is doing well today.  Her CBC is stable.  Her CMP is pending.  She will proceed with chemotherapy today.  I reviewed her lab work with her in detail.  She is tolerating treatment moderately well.    Matilda will return tomorrow for Neulasta and in one week for labs and evaluation of chemo toxicities.   I spent 15 minutes counseling the patient face to face.  The total time spent in the appointment was 30 minutes.  Minette Headland, [place] [phone]

## 2013-12-04 NOTE — Progress Notes (Signed)
Positive blood return throughout the adriamycin infusion.  No cc of pain at porta cath insertion site during  administration of adriamycin.

## 2013-12-04 NOTE — Patient Instructions (Addendum)
[facility] Discharge Instructions for Patients Receiving Chemotherapy  Today you received the following chemotherapy agents: Adriamycin and Cytoxan.  To help prevent nausea and vomiting after your treatment, we encourage you to take your nausea medication as prescribed.   If you develop nausea and vomiting that is not controlled by your nausea medication, call the clinic.   BELOW ARE SYMPTOMS THAT SHOULD BE REPORTED IMMEDIATELY:  *FEVER GREATER THAN 100.5 F  *CHILLS WITH OR WITHOUT FEVER  NAUSEA AND VOMITING THAT IS NOT CONTROLLED WITH YOUR NAUSEA MEDICATION  *UNUSUAL SHORTNESS OF BREATH  *UNUSUAL BRUISING OR BLEEDING  TENDERNESS IN MOUTH AND THROAT WITH OR WITHOUT PRESENCE OF ULCERS  *URINARY PROBLEMS  *BOWEL PROBLEMS  UNUSUAL RASH Items with * indicate a potential emergency and should be followed up as soon as possible.  Feel free to call the clinic you have any questions or concerns. The clinic phone number is [phone].   Cyclophosphamide injection What is this medicine? CYCLOPHOSPHAMIDE (sye kloe FOSS fa mide) is a chemotherapy drug. It slows the growth of cancer cells. This medicine is used to treat many types of cancer like lymphoma, myeloma, leukemia, breast cancer, and ovarian cancer, to name a few. This medicine may be used for other purposes; ask your health care provider or pharmacist if you have questions. COMMON BRAND NAME(S): Cytoxan, Neosar What should I tell my health care provider before I take this medicine? They need to know if you have any of these conditions: -blood disorders -history of other chemotherapy -infection -kidney disease -liver disease -recent or ongoing radiation therapy -tumors in the bone marrow -an unusual or allergic reaction to cyclophosphamide, other chemotherapy, other medicines, foods, dyes, or preservatives -pregnant or trying to get pregnant -breast-feeding How should I use this medicine? This drug is  usually given as an injection into a vein or muscle or by infusion into a vein. It is administered in a hospital or clinic by a specially trained health care professional. Talk to your pediatrician regarding the use of this medicine in children. Special care may be needed. Overdosage: If you think you have taken too much of this medicine contact a poison control center or emergency room at once. NOTE: This medicine is only for you. Do not share this medicine with others. What if I miss a dose? It is important not to miss your dose. Call your doctor or health care professional if you are unable to keep an appointment. What may interact with this medicine? This medicine may interact with the following medications: -amiodarone -amphotericin B -azathioprine -certain antiviral medicines for HIV or AIDS such as protease inhibitors (e.g., indinavir, ritonavir) and zidovudine -certain blood pressure medications such as benazepril, captopril, enalapril, fosinopril, lisinopril, moexipril, monopril, perindopril, quinapril, ramipril, trandolapril -certain cancer medications such as anthracyclines (e.g., daunorubicin, doxorubicin), busulfan, cytarabine, paclitaxel, pentostatin, tamoxifen, trastuzumab -certain diuretics such as chlorothiazide, chlorthalidone, hydrochlorothiazide, indapamide, metolazone -certain medicines that treat or prevent blood clots like warfarin -certain muscle relaxants such as succinylcholine -cyclosporine -etanercept -indomethacin -medicines to increase blood counts like filgrastim, pegfilgrastim, sargramostim -medicines used as general anesthesia -metronidazole -natalizumab This list may not describe all possible interactions. Give your health care provider a list of all the medicines, herbs, non-prescription drugs, or dietary supplements you use. Also tell them if you smoke, drink alcohol, or use illegal drugs. Some items may interact with your medicine. What should I watch for  while using this medicine? Visit your doctor for checks on your progress. This drug may   make you feel generally unwell. This is not uncommon, as chemotherapy can affect healthy cells as well as cancer cells. Report any side effects. Continue your course of treatment even though you feel ill unless your doctor tells you to stop. Drink water or other fluids as directed. Urinate often, even at night. In some cases, you may be given additional medicines to help with side effects. Follow all directions for their use. Call your doctor or health care professional for advice if you get a fever, chills or sore throat, or other symptoms of a cold or flu. Do not treat yourself. This drug decreases your body's ability to fight infections. Try to avoid being around people who are sick. This medicine may increase your risk to bruise or bleed. Call your doctor or health care professional if you notice any unusual bleeding. Be careful brushing and flossing your teeth or using a toothpick because you may get an infection or bleed more easily. If you have any dental work done, tell your dentist you are receiving this medicine. You may get drowsy or dizzy. Do not drive, use machinery, or do anything that needs mental alertness until you know how this medicine affects you. Do not become pregnant while taking this medicine or for 1 year after stopping it. Women should inform their doctor if they wish to become pregnant or think they might be pregnant. Men should not father a child while taking this medicine and for 4 months after stopping it. There is a potential for serious side effects to an unborn child. Talk to your health care professional or pharmacist for more information. Do not breast-feed an infant while taking this medicine. This medicine may interfere with the ability to have a child. This medicine has caused ovarian failure in some women. This medicine has caused reduced sperm counts in some men. You should talk  with your doctor or health care professional if you are concerned about your fertility. If you are going to have surgery, tell your doctor or health care professional that you have taken this medicine. What side effects may I notice from receiving this medicine? Side effects that you should report to your doctor or health care professional as soon as possible: -allergic reactions like skin rash, itching or hives, swelling of the face, lips, or tongue -low blood counts - this medicine may decrease the number of white blood cells, red blood cells and platelets. You may be at increased risk for infections and bleeding. -signs of infection - fever or chills, cough, sore throat, pain or difficulty passing urine -signs of decreased platelets or bleeding - bruising, pinpoint red spots on the skin, black, tarry stools, blood in the urine -signs of decreased red blood cells - unusually weak or tired, fainting spells, lightheadedness -breathing problems -dark urine -dizziness -palpitations -swelling of the ankles, feet, hands -trouble passing urine or change in the amount of urine -weight gain -yellowing of the eyes or skin Side effects that usually do not require medical attention (report to your doctor or health care professional if they continue or are bothersome): -changes in nail or skin color -hair loss -missed menstrual periods -mouth sores -nausea, vomiting This list may not describe all possible side effects. Call your doctor for medical advice about side effects. You may report side effects to FDA at 1-800-FDA-1088. Where should I keep my medicine? This drug is given in a hospital or clinic and will not be stored at home. NOTE: This sheet is a summary. It   may not cover all possible information. If you have questions about this medicine, talk to your doctor, pharmacist, or health care provider.  2015, [employer]. (2012-03-01 16:22:58) Doxorubicin injection What is this  medicine? DOXORUBICIN (dox oh ROO bi sin) is a chemotherapy drug. It is used to treat many kinds of cancer like Hodgkin's disease, leukemia, non-Hodgkin's lymphoma, neuroblastoma, sarcoma, and Wilms' tumor. It is also used to treat bladder cancer, breast cancer, lung cancer, ovarian cancer, stomach cancer, and thyroid cancer. This medicine may be used for other purposes; ask your health care provider or pharmacist if you have questions. COMMON BRAND NAME(S): Adriamycin, Adriamycin PFS, Adriamycin RDF, Rubex What should I tell my health care provider before I take this medicine? They need to know if you have any of these conditions: -blood disorders -heart disease, recent heart attack -infection (especially a virus infection such as chickenpox, cold sores, or herpes) -irregular heartbeat -liver disease -recent or ongoing radiation therapy -an unusual or allergic reaction to doxorubicin, other chemotherapy agents, other medicines, foods, dyes, or preservatives -pregnant or trying to get pregnant -breast-feeding How should I use this medicine? This drug is given as an infusion into a vein. It is administered in a hospital or clinic by a specially trained health care professional. If you have pain, swelling, burning or any unusual feeling around the site of your injection, tell your health care professional right away. Talk to your pediatrician regarding the use of this medicine in children. Special care may be needed. Overdosage: If you think you have taken too much of this medicine contact a poison control center or emergency room at once. NOTE: This medicine is only for you. Do not share this medicine with others. What if I miss a dose? It is important not to miss your dose. Call your doctor or health care professional if you are unable to keep an appointment. What may interact with this medicine? Do not take this medicine with any of the following  medications: -cisapride -droperidol -halofantrine -pimozide -zidovudine This medicine may also interact with the following medications: -chloroquine -chlorpromazine -clarithromycin -cyclophosphamide -cyclosporine -erythromycin -medicines for depression, anxiety, or psychotic disturbances -medicines for irregular heart beat like amiodarone, bepridil, dofetilide, encainide, flecainide, propafenone, quinidine -medicines for seizures like ethotoin, fosphenytoin, phenytoin -medicines for nausea, vomiting like dolasetron, ondansetron, palonosetron -medicines to increase blood counts like filgrastim, pegfilgrastim, sargramostim -methadone -methotrexate -pentamidine -progesterone -vaccines -verapamil Talk to your doctor or health care professional before taking any of these medicines: -acetaminophen -aspirin -ibuprofen -ketoprofen -naproxen This list may not describe all possible interactions. Give your health care provider a list of all the medicines, herbs, non-prescription drugs, or dietary supplements you use. Also tell them if you smoke, drink alcohol, or use illegal drugs. Some items may interact with your medicine. What should I watch for while using this medicine? Your condition will be monitored carefully while you are receiving this medicine. You will need important blood work done while you are taking this medicine. This drug may make you feel generally unwell. This is not uncommon, as chemotherapy can affect healthy cells as well as cancer cells. Report any side effects. Continue your course of treatment even though you feel ill unless your doctor tells you to stop. Your urine may turn red for a few days after your dose. This is not blood. If your urine is dark or brown, call your doctor. In some cases, you may be given additional medicines to help with side effects. Follow all directions for their use.   Call your doctor or health care professional for advice if you get a  fever, chills or sore throat, or other symptoms of a cold or flu. Do not treat yourself. This drug decreases your body's ability to fight infections. Try to avoid being around people who are sick. This medicine may increase your risk to bruise or bleed. Call your doctor or health care professional if you notice any unusual bleeding. Be careful brushing and flossing your teeth or using a toothpick because you may get an infection or bleed more easily. If you have any dental work done, tell your dentist you are receiving this medicine. Avoid taking products that contain aspirin, acetaminophen, ibuprofen, naproxen, or ketoprofen unless instructed by your doctor. These medicines may hide a fever. Men and women of childbearing age should use effective birth control methods while using taking this medicine. Do not become pregnant while taking this medicine. There is a potential for serious side effects to an unborn child. Talk to your health care professional or pharmacist for more information. Do not breast-feed an infant while taking this medicine. Do not let others touch your urine or other body fluids for 5 days after each treatment with this medicine. Caregivers should wear latex gloves to avoid touching body fluids during this time. There is a maximum amount of this medicine you should receive throughout your life. The amount depends on the medical condition being treated and your overall health. Your doctor will watch how much of this medicine you receive in your lifetime. Tell your doctor if you have taken this medicine before. What side effects may I notice from receiving this medicine? Side effects that you should report to your doctor or health care professional as soon as possible: -allergic reactions like skin rash, itching or hives, swelling of the face, lips, or tongue -low blood counts - this medicine may decrease the number of white blood cells, red blood cells and platelets. You may be at  increased risk for infections and bleeding. -signs of infection - fever or chills, cough, sore throat, pain or difficulty passing urine -signs of decreased platelets or bleeding - bruising, pinpoint red spots on the skin, black, tarry stools, blood in the urine -signs of decreased red blood cells - unusually weak or tired, fainting spells, lightheadedness -breathing problems -chest pain -fast, irregular heartbeat -mouth sores -nausea, vomiting -pain, swelling, redness at site where injected -pain, tingling, numbness in the hands or feet -swelling of ankles, feet, or hands -unusual bleeding or bruising Side effects that usually do not require medical attention (report to your doctor or health care professional if they continue or are bothersome): -diarrhea -facial flushing -hair loss -loss of appetite -missed menstrual periods -nail discoloration or damage -red or watery eyes -red colored urine -stomach upset This list may not describe all possible side effects. Call your doctor for medical advice about side effects. You may report side effects to FDA at 1-800-FDA-1088. Where should I keep my medicine? This drug is given in a hospital or clinic and will not be stored at home. NOTE: This sheet is a summary. It may not cover all possible information. If you have questions about this medicine, talk to your doctor, pharmacist, or health care provider.  2015, [employer]. (2012-08-13 09:54:34)  

## 2013-12-05 ENCOUNTER — Ambulatory Visit (HOSPITAL_BASED_OUTPATIENT_CLINIC_OR_DEPARTMENT_OTHER): Payer: BC Managed Care – PPO

## 2013-12-05 ENCOUNTER — Other Ambulatory Visit: Payer: Self-pay | Admitting: *Deleted

## 2013-12-05 VITALS — BP 130/72 | HR 101 | Temp 98.2°F

## 2013-12-05 DIAGNOSIS — C50919 Malignant neoplasm of unspecified site of unspecified female breast: Secondary | ICD-10-CM

## 2013-12-05 DIAGNOSIS — C50212 Malignant neoplasm of upper-inner quadrant of left female breast: Secondary | ICD-10-CM

## 2013-12-05 DIAGNOSIS — Z5189 Encounter for other specified aftercare: Secondary | ICD-10-CM

## 2013-12-05 DIAGNOSIS — C773 Secondary and unspecified malignant neoplasm of axilla and upper limb lymph nodes: Secondary | ICD-10-CM

## 2013-12-05 MED ORDER — PEGFILGRASTIM INJECTION 6 MG/0.6ML
6.0000 mg | Freq: Once | SUBCUTANEOUS | Status: AC
  Administered 2013-12-05: 6 mg via SUBCUTANEOUS
  Filled 2013-12-05: qty 0.6

## 2013-12-09 ENCOUNTER — Telehealth: Payer: Self-pay | Admitting: *Deleted

## 2013-12-09 ENCOUNTER — Telehealth: Payer: Self-pay | Admitting: Oncology

## 2013-12-09 NOTE — Telephone Encounter (Signed)
Per [facility] staff message scheduled appts. Advised scheduler 

## 2013-12-09 NOTE — Telephone Encounter (Signed)
LVM ADVISING INFUSION APPT ADDED ON 8/20 AND ASKED PT TO PICK UP A REVISED APPT CALENDAR WHEN SHE COMES IN ON 8/13.

## 2013-12-11 ENCOUNTER — Ambulatory Visit (HOSPITAL_BASED_OUTPATIENT_CLINIC_OR_DEPARTMENT_OTHER): Payer: BC Managed Care – PPO | Admitting: Adult Health

## 2013-12-11 ENCOUNTER — Encounter: Payer: Self-pay | Admitting: Adult Health

## 2013-12-11 ENCOUNTER — Other Ambulatory Visit (HOSPITAL_BASED_OUTPATIENT_CLINIC_OR_DEPARTMENT_OTHER): Payer: BC Managed Care – PPO

## 2013-12-11 VITALS — BP 126/66 | HR 96 | Temp 98.1°F | Resp 20 | Ht 66.0 in | Wt 150.2 lb

## 2013-12-11 DIAGNOSIS — Z170 Estrogen receptor positive status [ER+]: Secondary | ICD-10-CM

## 2013-12-11 DIAGNOSIS — D649 Anemia, unspecified: Secondary | ICD-10-CM

## 2013-12-11 DIAGNOSIS — C50212 Malignant neoplasm of upper-inner quadrant of left female breast: Secondary | ICD-10-CM

## 2013-12-11 DIAGNOSIS — D702 Other drug-induced agranulocytosis: Secondary | ICD-10-CM

## 2013-12-11 DIAGNOSIS — C773 Secondary and unspecified malignant neoplasm of axilla and upper limb lymph nodes: Secondary | ICD-10-CM

## 2013-12-11 DIAGNOSIS — R21 Rash and other nonspecific skin eruption: Secondary | ICD-10-CM

## 2013-12-11 DIAGNOSIS — F411 Generalized anxiety disorder: Secondary | ICD-10-CM

## 2013-12-11 DIAGNOSIS — C50919 Malignant neoplasm of unspecified site of unspecified female breast: Secondary | ICD-10-CM

## 2013-12-11 LAB — COMPREHENSIVE METABOLIC PANEL (CC13)
ALBUMIN: 3.5 g/dL (ref 3.5–5.0)
ALT: 26 U/L (ref 0–55)
ANION GAP: 8 meq/L (ref 3–11)
AST: 13 U/L (ref 5–34)
Alkaline Phosphatase: 74 U/L (ref 40–150)
BUN: 12.6 mg/dL (ref 7.0–26.0)
CO2: 26 meq/L (ref 22–29)
Calcium: 8.8 mg/dL (ref 8.4–10.4)
Chloride: 107 mEq/L (ref 98–109)
Creatinine: 0.8 mg/dL (ref 0.6–1.1)
Glucose: 102 mg/dl (ref 70–140)
POTASSIUM: 3.7 meq/L (ref 3.5–5.1)
SODIUM: 142 meq/L (ref 136–145)
TOTAL PROTEIN: 6.5 g/dL (ref 6.4–8.3)
Total Bilirubin: 0.27 mg/dL (ref 0.20–1.20)

## 2013-12-11 LAB — CBC WITH DIFFERENTIAL/PLATELET
BASO%: 1.1 % (ref 0.0–2.0)
Basophils Absolute: 0 10*3/uL (ref 0.0–0.1)
EOS ABS: 0 10*3/uL (ref 0.0–0.5)
EOS%: 0.5 % (ref 0.0–7.0)
HCT: 31.3 % — ABNORMAL LOW (ref 34.8–46.6)
HGB: 10.5 g/dL — ABNORMAL LOW (ref 11.6–15.9)
LYMPH#: 0.5 10*3/uL — AB (ref 0.9–3.3)
LYMPH%: 35.4 % (ref 14.0–49.7)
MCH: 31.6 pg (ref 25.1–34.0)
MCHC: 33.5 g/dL (ref 31.5–36.0)
MCV: 94.4 fL (ref 79.5–101.0)
MONO#: 0.2 10*3/uL (ref 0.1–0.9)
MONO%: 10.5 % (ref 0.0–14.0)
NEUT%: 52.5 % (ref 38.4–76.8)
NEUTROS ABS: 0.8 10*3/uL — AB (ref 1.5–6.5)
Platelets: 194 10*3/uL (ref 145–400)
RBC: 3.31 10*6/uL — AB (ref 3.70–5.45)
RDW: 13.4 % (ref 11.2–14.5)
WBC: 1.5 10*3/uL — AB (ref 3.9–10.3)

## 2013-12-11 MED ORDER — CIPROFLOXACIN HCL 500 MG PO TABS: 500.0000 mg | ORAL_TABLET | Freq: Two times a day (BID) | ORAL | Status: DC

## 2013-12-11 NOTE — Patient Instructions (Signed)
  Patient Neutropenia Instruction Sheet  Diagnosis: Breast Cancer      Treating Physician: Dr. Jana Hakim  Treatment: 1. Type of chemotherapy: Doxorubicin and Cyclophosphamide 2. Date of last treatment: 12/04/13  Last Blood Counts: Lab Results  Component Value Date   WBC 1.5* 12/11/2013   HGB 10.5* 12/11/2013   HCT 31.3* 12/11/2013   MCV 94.4 12/11/2013   PLT 194 12/11/2013   ANC 800     Prophylactic Antibiotics: Cipro 500 mg by mouth twice a day Instructions: 1. Monitor temperature and call if fever  greater than 100.5, chills, shaking chills (rigors) 2. Call Physician on-call at [phone] 3. Give him/her symptoms and list of medications that you are taking and your last blood count.

## 2013-12-11 NOTE — Progress Notes (Signed)
[place]  [phone] [phone]     ID: [name] [name] OB: 1961-04-04  MR#: [id number]  JYN#:[id number]  PCP: [name]., MD GYN:  [name] SU: Excell Seltzer OTHER MD: [name]  CHIEF COMPLAINT: Locally advanced breast cancer for chemotherapy f/u. CURRENT TREATMENT: adjuvant chemotherapy  BREAST CANCER HISTORY:  [name] [name] is 53 years old and she first noted a change, like a small hard pea in her left axilla. She brought this to Dr. [name] attention and bilateral diagnostic mammography and left ultrasonography at the [facility] Center for 07/18/2013 showed a small area of asymmetry in the inner left breast measuring perhaps 5 mm which was not palpable by physical exam. In the right breast there was some loosely grouped calcifications spanning up to 0.7 cm. Ultrasound of the left breast confirmed an ill-defined area of shadowing at the 9:00 position measuring up to 1.3 cm. There was also a 4 mm circumscribed hypoechoic mass at the 10:00 position. This is felt to be probably benign. In the left axilla there were 2 lymph nodes with slightly increased cortical thickening, although the fatty hila were maintained. These were not related to the patient's feeling of a "pea like mass" in her left axilla, which had by then pretty much resolved.   On 08/07/2013 the patient underwent left breast needle core biopsy and this showed ([id number]) and invasive lobular carcinoma (E-cadherin negative) estrogen receptor 70% positive with strong staining intensity, progesterone receptor 70% positive and strong staining intensity, with a proliferation marker of 7% and no HER-2 amplification, the signals ratio being 0.94 in the number per cell 1.50.  Bilateral breast MRI 08/15/2013 found an irregular spiculated mass in the left breast measuring approximately 1.5 cm. There was no abnormal enhancement of the pectoralis. There were at least 2 left axillary lymph nodes and showed  focal cortical thickening. There was a single left internal mammary lymph node at the level of the biopsy-proven malignancy which was not pathologic in appearance. The right breast and axilla were benign.  Genetics testing may 2015 was normal and did not reveal a mutation in the genes tested: ATM, BARD1, BRCA1, BRCA2, BRIP1, CDH1, CHEK2, MRE11A, MUTYH, NBN, NF1, PALB2, PTEN, RAD50, RAD51C, RAD51D, and TP53. She is status post left lumpectomy and axillary lymph node dissection by Dr Excell Seltzer 09/09/2013 for a pT1c pN2, stage IIIa invasive lobular carcinoma, grade 1, repeat HER-2 again negative. Patient had 8/23 LN positive and also evidence of LVI (lymphovascular invasion) and PNI (perineural invasion).  INTERVAL HISTORY:  [name] [name] is here today for evaluation after receiving Doxorubicin and cyclophosphamide.  She receives treatment on day 1 of a 14 day cycle with Neulasta given on day 2 for granulocyte support.  A total of four cycles are planned.   [name] [name] is currently cycle 3 day 8 of treatment with Doxorubicin and Cyclophosphamide.  She wants to review her nausea medication, because she has not been very nauseated so she has been taking her anti-emetics on an as needed basis.  She also has a rash that she wants me to look at.  She first noticed this rash 2 days ago.  She says that it is worse and itches when she gets hot.  She has a waxing and waning bowel regimen with constipation one day and loose stools the next.  But she is managing it when needed with prunes.  She is fatigued.  She otherwise denies fevers, chills, nausea, vomiting, nail changes, numbness/tingling, or any further concerns.  REVIEW OF SYSTEMS: A 10 point review of systems was conducted and is otherwise negative except for what is noted above.     PAST MEDICAL HISTORY: Past Medical History  Diagnosis Date  . Palpitations 2009    Improved; Secondary to premature ventricular contractions. No problems since-pt states was caring  for ailing father, anxiety  . Anxiety disorder     Mild: treated by her gynecologist with selective serotonin reuptake inhibitor  . Nonspecific abnormal electrocardiogram (ECG) (EKG) 2009  . GERD (gastroesophageal reflux disease)     rolaids  . Hot flashes   . Malignant neoplasm of breast (female), unspecified site   . Family history of malignant neoplasm of breast   . Arthritis     hands and feet  . Wears glasses     PAST SURGICAL HISTORY: Past Surgical History  Procedure Laterality Date  . Thyroidectomy, partial    . Cesarean section    . Endometrial ablation    . Dilation and curettage of uterus    . Laparoscopic assisted vaginal hysterectomy  02/07/2011    Procedure: LAPAROSCOPIC ASSISTED VAGINAL HYSTERECTOMY;  Surgeon: [name];  Location: [facility];  Service: Gynecology;  Laterality: N/A;  . Abdominal hysterectomy  02/07/2011    Procedure: HYSTERECTOMY ABDOMINAL;  Surgeon: [name];  Location: [facility];  Service: Gynecology;  Laterality: N/A;  . Breast lumpectomy with needle localization and axillary lymph node dissection Left 09/09/2013    Procedure: LEFT BREAST LUMPECTOMY WITH NEEDLE LOCALIZATION AND LEFT AXILLARY LYMPH NODE DISSECTION;  Surgeon: [name], MD;  Location: [facility];  Service: General;  Laterality: Left;  . Portacath placement Right 09/29/2013    Procedure: INSERTION PORT-A-CATH;  Surgeon: [name], MD;  Location: [facility];  Service: General;  Laterality: Right;    FAMILY HISTORY Family History  Problem Relation Age of Onset  . Angina Father   . Aortic aneurysm Father   . Dementia Father   . Arrhythmia Maternal Aunt     A fib  . Breast cancer Maternal Aunt 56    currently 79  . Hypertension Other   . Arrhythmia Other     Uncle- a fib  . Arrhythmia Maternal Aunt     A fib  . Breast cancer Maternal Aunt     dx 20s; currently 65s  . Breast cancer Mother     dx 53s. Died at 43  . Colon  cancer Maternal Grandmother     dx 64s; died at 58  . Colon cancer Maternal Grandfather     dx 69s; died in 48s  . Cancer Paternal Uncle     unknown primary in early 24s  . Breast cancer Maternal Aunt     dx 71s; died in 2s  The patient's father died from heart disease in the setting of dementia at the age of 30. The patient's mother died at the age of 40. She had been diagnosed with breast cancer at the age of 28. The patient is a single child. Her mother had 4 sisters, 3 of whom had breast cancer diagnosed at the age of 78, 60, and 38. There is also history of colorectal cancer on the maternal side of the family. BRCA testing negative.  GYNECOLOGIC HISTORY:   menarche age 51, first live birth age 78, the patient is [place] P2. She underwent a hysterectomy without salpingo-oophorectomy in 2012. 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 Her husband [name]. Son [name] lives in [place] where he works as a Public relations account executive. Daughter Darrick Penna this in [place], where she works as an Engineering geologist in a [employer]. The patient has no grandchildren. She is a Information systems manager.   ADVANCED DIRECTIVES: Not in place   HEALTH MAINTENANCE: History  Substance Use Topics  . Smoking status: Never Smoker   . Smokeless tobacco: Not on file  . Alcohol Use: Yes     Comment: drinks 1-2 beers a week     Colonoscopy:2010   PAP:2012, s/p hysterectomy.  Bone density:2012   Lipid panel: 11/05/2013 TC 219, HDL 41, LDL 137, TG 204  No Known Allergies  Current Outpatient Prescriptions  Medication Sig Dispense Refill  . calcium carbonate-magnesium hydroxide (ROLAIDS) 334 MG CHEW Chew 1 tablet by mouth 2 (two) times daily as needed. For indigestion       . Cholecalciferol (VITAMIN D) 2000 UNITS tablet Take 2,000 Units by mouth daily.      Marland Kitchen dexamethasone (DECADRON) 4 MG tablet Take 4 mg by mouth 2  (two) times daily with a meal.      . diclofenac (VOLTAREN) 75 MG EC tablet Take 75 mg by mouth as needed.      . gabapentin (NEURONTIN) 300 MG capsule Take 1 capsule (300 mg total) by mouth at bedtime.  30 capsule  6  . lidocaine-prilocaine (EMLA) cream Apply 1 application topically as needed. Apply over port 1-2 hours before chemo, cover with plastic wrap  30 g  0  . loratadine (CLARITIN) 10 MG tablet Take 10 mg by mouth daily.      Marland Kitchen LORazepam (ATIVAN) 0.5 MG tablet Take 1 tablet (0.5 mg total) by mouth at bedtime as needed (Nausea or vomiting).  30 tablet  0  . NON FORMULARY       . oxyCODONE (OXY IR/ROXICODONE) 5 MG immediate release tablet Take 1-2 tablets (5-10 mg total) by mouth every 6 (six) hours as needed.  30 tablet  0  . prochlorperazine (COMPAZINE) 10 MG tablet Take 1 tablet (10 mg total) by mouth every 6 (six) hours as needed (Nausea or vomiting).  30 tablet  1   No current facility-administered medications for this visit.    OBJECTIVE: middle-aged white woman  Filed Vitals:   12/11/13 1513  BP: 126/66  Pulse: 96  Temp: 98.1 F (36.7 C)  Resp: 20     Body mass index is 24.25 kg/(m^2).    ECOG FS:1 - Symptomatic but completely ambulatory  GENERAL: Patient is a well appearing female in no acute distress HEENT:  Sclerae anicteric.  Oropharynx clear and moist. No ulcerations or evidence of oropharyngeal candidiasis. Neck is supple.  NODES:  No cervical, supraclavicular, or axillary lymphadenopathy palpated.  BREAST EXAM:  Deferred. LUNGS:  Clear to auscultation bilaterally.  No wheezes or rhonchi. HEART:  Regular rate and rhythm. No murmur appreciated. ABDOMEN:  Soft, nontender.  Positive, normoactive bowel sounds. No organomegaly palpated. MSK:  No focal spinal tenderness to palpation. Full range of motion bilaterally in the upper extremities. EXTREMITIES:  No peripheral edema.   SKIN:  Mild papular rash underneath breasts. No nail dyscrasia. NEURO:  Nonfocal. Well  oriented.  Appropriate affect.      LAB RESULTS:  CMP     Component Value Date/Time   NA 141 12/04/2013 1336   K 4.0 12/04/2013 1336   CO2 27 12/04/2013 1336  GLUCOSE 92 12/04/2013 1336   BUN 10.6 12/04/2013 1336   CREATININE 1.1 12/04/2013 1336   CALCIUM 9.2 12/04/2013 1336   PROT 7.0 12/04/2013 1336   ALBUMIN 3.8 12/04/2013 1336   AST 19 12/04/2013 1336   ALT 47 12/04/2013 1336   ALKPHOS 68 12/04/2013 1336   BILITOT 0.48 12/04/2013 1336    I No results found for this basename: SPEP,  UPEP,   kappa and lambda light chains    Lab Results  Component Value Date   WBC 1.5* 12/11/2013   NEUTROABS 0.8* 12/11/2013   HGB 10.5* 12/11/2013   HCT 31.3* 12/11/2013   MCV 94.4 12/11/2013   PLT 194 12/11/2013      Chemistry      Component Value Date/Time   NA 141 12/04/2013 1336   K 4.0 12/04/2013 1336   CO2 27 12/04/2013 1336   BUN 10.6 12/04/2013 1336   CREATININE 1.1 12/04/2013 1336      Component Value Date/Time   CALCIUM 9.2 12/04/2013 1336   ALKPHOS 68 12/04/2013 1336   AST 19 12/04/2013 1336   ALT 47 12/04/2013 1336   BILITOT 0.48 12/04/2013 1336      STUDIES:  ECHO 09/30/13 EF 09%, grade 2 diastolic dysfunction.  ASSESSMENT: 53 y.o. BRCA negative [place] woman status post left breast biopsy for 01/18/2014 showing invasive lobular carcinoma (E-cadherin negative), estrogen receptor 70% positive, progesterone receptor 70% positive, with an MIB-1 of 7%, and no HER-2 amplification  (1) ultrasound-guided biopsy of a suspicious lymph node in the left axilla 08/19/2012 was positive  (2) status post left lumpectomy and axillary lymph node dissection 09/09/2013 for a pT1c pN2, stage IIIa invasive lobular carcinoma, grade 1, repeat HER-2 again negative. She has 8/23 LN +, LVI+,PNI+.  (4) Adjuvant chemotherapy consists of doxorubicin and cyclophosphamide in dose dense fashion x4, with Neulasta support, followed by paclitaxel in dose dense fashion x4, again with Neulasta support.   (5) radiation to follow  chemotherapy  (6) antiestrogen therapy to follow radiation   PLAN:  Chimere is doing moderately well today.  She is subsequently neutropenic due to chemotherapy.  I reviewed neutropenic precautions with her in detail and gave her these instructions in her AVS.  I prescribed Cipro $RemoveBeforeDE'500mg'[id number]$  BID.  Her hemoglobin is slightly decreased and this is likely secondary to treatment.  I will follow this closely.    [name] [name] does have treatment related fatigue and I recommended she continue walking short distances to help, which she has done and agrees that it does help.    [name] [name]'s rash is likely heat related and she will keep it dry.    I briefly reviewed her next chemotherapy regimen with her and dates and times of treatments.  [name] [name] will return in one week for labs and cycle 4 of Doxorubicin and Cyclophosphamide.    I spent 25 minutes counseling the patient face to face.  The total time spent in the appointment was 30 minutes.  [name] [name], [place] [phone]

## 2013-12-18 ENCOUNTER — Ambulatory Visit (HOSPITAL_BASED_OUTPATIENT_CLINIC_OR_DEPARTMENT_OTHER): Payer: BC Managed Care – PPO | Admitting: Adult Health

## 2013-12-18 ENCOUNTER — Other Ambulatory Visit (HOSPITAL_BASED_OUTPATIENT_CLINIC_OR_DEPARTMENT_OTHER): Payer: BC Managed Care – PPO

## 2013-12-18 ENCOUNTER — Other Ambulatory Visit: Payer: Self-pay | Admitting: Oncology

## 2013-12-18 ENCOUNTER — Ambulatory Visit (HOSPITAL_BASED_OUTPATIENT_CLINIC_OR_DEPARTMENT_OTHER): Payer: BC Managed Care – PPO

## 2013-12-18 ENCOUNTER — Encounter: Payer: Self-pay | Admitting: Adult Health

## 2013-12-18 VITALS — BP 133/90 | HR 103 | Temp 98.1°F | Resp 18 | Ht 66.0 in | Wt 148.8 lb

## 2013-12-18 DIAGNOSIS — Z170 Estrogen receptor positive status [ER+]: Secondary | ICD-10-CM

## 2013-12-18 DIAGNOSIS — C773 Secondary and unspecified malignant neoplasm of axilla and upper limb lymph nodes: Secondary | ICD-10-CM

## 2013-12-18 DIAGNOSIS — C50919 Malignant neoplasm of unspecified site of unspecified female breast: Secondary | ICD-10-CM

## 2013-12-18 DIAGNOSIS — C50212 Malignant neoplasm of upper-inner quadrant of left female breast: Secondary | ICD-10-CM

## 2013-12-18 DIAGNOSIS — F411 Generalized anxiety disorder: Secondary | ICD-10-CM

## 2013-12-18 DIAGNOSIS — R21 Rash and other nonspecific skin eruption: Secondary | ICD-10-CM

## 2013-12-18 DIAGNOSIS — Z5111 Encounter for antineoplastic chemotherapy: Secondary | ICD-10-CM

## 2013-12-18 LAB — COMPREHENSIVE METABOLIC PANEL (CC13)
ALT: 22 U/L (ref 0–55)
AST: 15 U/L (ref 5–34)
Albumin: 4.1 g/dL (ref 3.5–5.0)
Alkaline Phosphatase: 67 U/L (ref 40–150)
Anion Gap: 10 mEq/L (ref 3–11)
BILIRUBIN TOTAL: 0.34 mg/dL (ref 0.20–1.20)
BUN: 10.8 mg/dL (ref 7.0–26.0)
CO2: 25 mEq/L (ref 22–29)
CREATININE: 0.8 mg/dL (ref 0.6–1.1)
Calcium: 9.5 mg/dL (ref 8.4–10.4)
Chloride: 106 mEq/L (ref 98–109)
Glucose: 95 mg/dl (ref 70–140)
Potassium: 4.1 mEq/L (ref 3.5–5.1)
Sodium: 141 mEq/L (ref 136–145)
Total Protein: 7.2 g/dL (ref 6.4–8.3)

## 2013-12-18 LAB — CBC WITH DIFFERENTIAL/PLATELET
BASO%: 0.7 % (ref 0.0–2.0)
Basophils Absolute: 0.1 10*3/uL (ref 0.0–0.1)
EOS%: 0 % (ref 0.0–7.0)
Eosinophils Absolute: 0 10*3/uL (ref 0.0–0.5)
HEMATOCRIT: 35.1 % (ref 34.8–46.6)
HGB: 11.9 g/dL (ref 11.6–15.9)
LYMPH#: 1.3 10*3/uL (ref 0.9–3.3)
LYMPH%: 9.5 % — ABNORMAL LOW (ref 14.0–49.7)
MCH: 32 pg (ref 25.1–34.0)
MCHC: 33.9 g/dL (ref 31.5–36.0)
MCV: 94.6 fL (ref 79.5–101.0)
MONO#: 1 10*3/uL — ABNORMAL HIGH (ref 0.1–0.9)
MONO%: 7.7 % (ref 0.0–14.0)
NEUT#: 11 10*3/uL — ABNORMAL HIGH (ref 1.5–6.5)
NEUT%: 82.1 % — AB (ref 38.4–76.8)
Platelets: 250 10*3/uL (ref 145–400)
RBC: 3.71 10*6/uL (ref 3.70–5.45)
RDW: 14.8 % — ABNORMAL HIGH (ref 11.2–14.5)
WBC: 13.4 10*3/uL — ABNORMAL HIGH (ref 3.9–10.3)

## 2013-12-18 MED ORDER — PALONOSETRON HCL INJECTION 0.25 MG/5ML
INTRAVENOUS | Status: AC
  Filled 2013-12-18: qty 5

## 2013-12-18 MED ORDER — SODIUM CHLORIDE 0.9 % IV SOLN
Freq: Once | INTRAVENOUS | Status: AC
  Administered 2013-12-18: 15:00:00 via INTRAVENOUS

## 2013-12-18 MED ORDER — NYSTATIN-TRIAMCINOLONE 100000-0.1 UNIT/GM-% EX CREA: 1.0000 "application " | TOPICAL_CREAM | Freq: Two times a day (BID) | CUTANEOUS | Status: DC

## 2013-12-18 MED ORDER — SODIUM CHLORIDE 0.9 % IJ SOLN
10.0000 mL | INTRAMUSCULAR | Status: DC | PRN
Start: 2013-12-18 — End: 2013-12-18
  Filled 2013-12-18: qty 10

## 2013-12-18 MED ORDER — DEXAMETHASONE SODIUM PHOSPHATE 20 MG/5ML IJ SOLN
INTRAMUSCULAR | Status: AC
  Filled 2013-12-18: qty 5

## 2013-12-18 MED ORDER — SODIUM CHLORIDE 0.9 % IV SOLN
600.0000 mg/m2 | Freq: Once | INTRAVENOUS | Status: AC
  Administered 2013-12-18: 1080 mg via INTRAVENOUS
  Filled 2013-12-18: qty 54

## 2013-12-18 MED ORDER — DEXAMETHASONE SODIUM PHOSPHATE 20 MG/5ML IJ SOLN
12.0000 mg | Freq: Once | INTRAMUSCULAR | Status: AC
  Administered 2013-12-18: 12 mg via INTRAVENOUS

## 2013-12-18 MED ORDER — DOXORUBICIN HCL CHEMO IV INJECTION 2 MG/ML
60.0000 mg/m2 | Freq: Once | INTRAVENOUS | Status: AC
  Administered 2013-12-18: 108 mg via INTRAVENOUS
  Filled 2013-12-18: qty 54

## 2013-12-18 MED ORDER — HEPARIN SOD (PORK) LOCK FLUSH 100 UNIT/ML IV SOLN
500.0000 [IU] | Freq: Once | INTRAVENOUS | Status: DC | PRN
  Filled 2013-12-18: qty 5

## 2013-12-18 MED ORDER — PALONOSETRON HCL INJECTION 0.25 MG/5ML
0.2500 mg | Freq: Once | INTRAVENOUS | Status: AC
  Administered 2013-12-18: 0.25 mg via INTRAVENOUS

## 2013-12-18 MED ORDER — SODIUM CHLORIDE 0.9 % IV SOLN
150.0000 mg | Freq: Once | INTRAVENOUS | Status: AC
  Administered 2013-12-18: 150 mg via INTRAVENOUS
  Filled 2013-12-18: qty 5

## 2013-12-18 NOTE — Progress Notes (Signed)
[name] [name]  [phone] [phone]) [phone]     ID: [name] [name] OB: 03/17/1961  MR#: [id number]  HWE#:[id number]  PCP: [name]., MD GYN:  [name] SU: Excell Seltzer OTHER MD: [name]  CHIEF COMPLAINT: Locally advanced breast cancer for chemotherapy f/u. CURRENT TREATMENT: adjuvant chemotherapy  BREAST CANCER HISTORY:  [name] [name] is 53 years old and she first noted a change, like a small hard pea in her left axilla. She brought this to Dr. [name] attention and bilateral diagnostic mammography and left ultrasonography at the [facility] Center for 07/18/2013 showed a small area of asymmetry in the inner left breast measuring perhaps 5 mm which was not palpable by physical exam. In the right breast there was some loosely grouped calcifications spanning up to 0.7 cm. Ultrasound of the left breast confirmed an ill-defined area of shadowing at the 9:00 position measuring up to 1.3 cm. There was also a 4 mm circumscribed hypoechoic mass at the 10:00 position. This is felt to be probably benign. In the left axilla there were 2 lymph nodes with slightly increased cortical thickening, although the fatty hila were maintained. These were not related to the patient's feeling of a "pea like mass" in her left axilla, which had by then pretty much resolved.   On 08/07/2013 the patient underwent left breast needle core biopsy and this showed ([id number]) and invasive lobular carcinoma (E-cadherin negative) estrogen receptor 70% positive with strong staining intensity, progesterone receptor 70% positive and strong staining intensity, with a proliferation marker of 7% and no HER-2 amplification, the signals ratio being 0.94 in the number per cell 1.50.  Bilateral breast MRI 08/15/2013 found an irregular spiculated mass in the left breast measuring approximately 1.5 cm. There was no abnormal enhancement of the pectoralis. There were at least 2 left axillary lymph nodes and showed  focal cortical thickening. There was a single left internal mammary lymph node at the level of the biopsy-proven malignancy which was not pathologic in appearance. The right breast and axilla were benign.  Genetics testing may 2015 was normal and did not reveal a mutation in the genes tested: ATM, BARD1, BRCA1, BRCA2, BRIP1, CDH1, CHEK2, MRE11A, MUTYH, NBN, NF1, PALB2, PTEN, RAD50, RAD51C, RAD51D, and TP53. She is status post left lumpectomy and axillary lymph node dissection by Dr Excell Seltzer 09/09/2013 for a pT1c pN2, stage IIIa invasive lobular carcinoma, grade 1, repeat HER-2 again negative. Patient had 8/23 LN positive and also evidence of LVI (lymphovascular invasion) and PNI (perineural invasion).  INTERVAL HISTORY:  [name] [name] is here today for evaluation prior to receiving Doxorubicin and cyclophosphamide.  She receives treatment on day 1 of a 14 day cycle with Neulasta given on day 2 for granulocyte support.  A total of four cycles are planned.   [name] [name] is currently cycle 4 day 1 of treatment with Doxorubicin and Cyclophosphamide. She is doing well today.  She continues to have a small rash underneath her breasts, and two small lesions on each of her shoulders.  She first noticed these lesions over the weekend.  They do not itch or burn.  She has noticed slightly decreased appetite and early satiety.  She is eating, and continues to drink about 48 ounces of non caffeinated beverages per day.  She denies fevers, chills, nausea, vomiting, constipation, diarrhea, numbness/tingling, or any further concerns.   REVIEW OF SYSTEMS: A 10 point review of systems was conducted and is otherwise negative except for what is noted above.     PAST  MEDICAL HISTORY: Past Medical History  Diagnosis Date  . Palpitations 2009    Improved; Secondary to premature ventricular contractions. No problems since-pt states was caring for ailing father, anxiety  . Anxiety disorder     Mild: treated by her gynecologist with  selective serotonin reuptake inhibitor  . Nonspecific abnormal electrocardiogram (ECG) (EKG) 2009  . GERD (gastroesophageal reflux disease)     rolaids  . Hot flashes   . Malignant neoplasm of breast (female), unspecified site   . Family history of malignant neoplasm of breast   . Arthritis     hands and feet  . Wears glasses     PAST SURGICAL HISTORY: Past Surgical History  Procedure Laterality Date  . Thyroidectomy, partial    . Cesarean section    . Endometrial ablation    . Dilation and curettage of uterus    . Laparoscopic assisted vaginal hysterectomy  02/07/2011    Procedure: LAPAROSCOPIC ASSISTED VAGINAL HYSTERECTOMY;  Surgeon: Gretchen Adkins;  Location: [facility];  Service: Gynecology;  Laterality: N/A;  . Abdominal hysterectomy  02/07/2011    Procedure: HYSTERECTOMY ABDOMINAL;  Surgeon: Gretchen Adkins;  Location: [facility];  Service: Gynecology;  Laterality: N/A;  . Breast lumpectomy with needle localization and axillary lymph node dissection Left 09/09/2013    Procedure: LEFT BREAST LUMPECTOMY WITH NEEDLE LOCALIZATION AND LEFT AXILLARY LYMPH NODE DISSECTION;  Surgeon: Benjamin T Hoxworth, MD;  Location: [facility];  Service: General;  Laterality: Left;  . Portacath placement Right 09/29/2013    Procedure: INSERTION PORT-A-CATH;  Surgeon: Benjamin T Hoxworth, MD;  Location: [facility];  Service: General;  Laterality: Right;    FAMILY HISTORY Family History  Problem Relation Age of Onset  . Angina Father   . Aortic aneurysm Father   . Dementia Father   . Arrhythmia Maternal Aunt     A fib  . Breast cancer Maternal Aunt 56    currently 73  . Hypertension Other   . Arrhythmia Other     Uncle- a fib  . Arrhythmia Maternal Aunt     A fib  . Breast cancer Maternal Aunt     dx 70s; currently 80s  . Breast cancer Mother     dx 70s. Died at 93  . Colon cancer Maternal Grandmother     dx 70s; died at 90  . Colon cancer Maternal Grandfather      dx 70s; died in 70s  . Cancer Paternal Uncle     unknown primary in early 60s  . Breast cancer Maternal Aunt     dx 50s; died in 60s  The patient's father died from heart disease in the setting of dementia at the age of 82. The patient's mother died at the age of 93. She had been diagnosed with breast cancer at the age of 82. The patient is a single child. Her mother had 4 sisters, 3 of whom had breast cancer diagnosed at the age of 56, 70, and 50. There is also history of colorectal cancer on the maternal side of the family. BRCA testing negative.  GYNECOLOGIC HISTORY:   menarche age 11, first live birth age 22, the patient is GX P2. She underwent a hysterectomy without salpingo-oophorectomy in 2012. 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 Her husband David. Son Brian lives in [place] where he works as a   bank employee. Daughter Shelly this in [place], where she works as an aesthetician in a medical spa. The patient has no grandchildren. She is a protestant.   ADVANCED DIRECTIVES: Not in place   HEALTH MAINTENANCE: History  Substance Use Topics  . Smoking status: Never Smoker   . Smokeless tobacco: Not on file  . Alcohol Use: Yes     Comment: drinks 1-2 beers a week     Colonoscopy:2010   PAP:2012, s/p hysterectomy.  Bone density:2012   Lipid panel: 11/05/2013 TC 219, HDL 41, LDL 137, TG 204  No Known Allergies  Current Outpatient Prescriptions  Medication Sig Dispense Refill  . calcium carbonate-magnesium hydroxide (ROLAIDS) 334 MG CHEW Chew 1 tablet by mouth 2 (two) times daily as needed. For indigestion       . Cholecalciferol (VITAMIN D) 2000 UNITS tablet Take 2,000 Units by mouth daily.      . ciprofloxacin (CIPRO) 500 MG tablet Take 1 tablet (500 mg total) by mouth 2 (two) times daily.  14 tablet  0  . dexamethasone (DECADRON) 4 MG tablet  Take 4 mg by mouth 2 (two) times daily with a meal.      . diclofenac (VOLTAREN) 75 MG EC tablet Take 75 mg by mouth as needed.      . gabapentin (NEURONTIN) 300 MG capsule Take 1 capsule (300 mg total) by mouth at bedtime.  30 capsule  6  . lidocaine-prilocaine (EMLA) cream Apply 1 application topically as needed. Apply over port 1-2 hours before chemo, cover with plastic wrap  30 g  0  . loratadine (CLARITIN) 10 MG tablet Take 10 mg by mouth daily.      . LORazepam (ATIVAN) 0.5 MG tablet Take 1 tablet (0.5 mg total) by mouth at bedtime as needed (Nausea or vomiting).  30 tablet  0  . NON FORMULARY       . nystatin-triamcinolone (MYCOLOG II) cream Apply 1 application topically 2 (two) times daily.  30 g  0  . oxyCODONE (OXY IR/ROXICODONE) 5 MG immediate release tablet Take 1-2 tablets (5-10 mg total) by mouth every 6 (six) hours as needed.  30 tablet  0  . prochlorperazine (COMPAZINE) 10 MG tablet Take 1 tablet (10 mg total) by mouth every 6 (six) hours as needed (Nausea or vomiting).  30 tablet  1   No current facility-administered medications for this visit.    OBJECTIVE: middle-aged white woman  Filed Vitals:   12/18/13 1311  BP: 133/90  Pulse: 103  Temp: 98.1 F (36.7 C)  Resp: 18     Body mass index is 24.03 kg/(m^2).    ECOG FS:1 - Symptomatic but completely ambulatory  GENERAL: Patient is a well appearing female in no acute distress HEENT:  Sclerae anicteric.  Oropharynx clear and moist. No ulcerations or evidence of oropharyngeal candidiasis. Neck is supple.  NODES:  No cervical, supraclavicular, or axillary lymphadenopathy palpated.  BREAST EXAM:  Deferred. LUNGS:  Clear to auscultation bilaterally.  No wheezes or rhonchi. HEART:  Regular rate and rhythm. No murmur appreciated. ABDOMEN:  Soft, nontender.  Positive, normoactive bowel sounds. No organomegaly palpated. MSK:  No focal spinal tenderness to palpation. Full range of motion bilaterally in the upper  extremities. EXTREMITIES:  No peripheral edema.   SKIN:  Mild papular rash underneath breasts, two small papular circular lesions on each posterior shoulder, appear to have been scratched and scabbed over. No nail dyscrasia. NEURO:  Nonfocal. Well oriented.  Appropriate affect.     LAB RESULTS:  CMP     Component Value Date/Time   NA 141 12/18/2013 1258   K 4.1 12/18/2013 1258   CO2 25 12/18/2013 1258   GLUCOSE 95 12/18/2013 1258   BUN 10.8 12/18/2013 1258   CREATININE 0.8 12/18/2013 1258   CALCIUM 9.5 12/18/2013 1258   PROT 7.2 12/18/2013 1258   ALBUMIN 4.1 12/18/2013 1258   AST 15 12/18/2013 1258   ALT 22 12/18/2013 1258   ALKPHOS 67 12/18/2013 1258   BILITOT 0.34 12/18/2013 1258    I No results found for this basename: SPEP,  UPEP,   kappa and lambda light chains    Lab Results  Component Value Date   WBC 13.4* 12/18/2013   NEUTROABS 11.0* 12/18/2013   HGB 11.9 12/18/2013   HCT 35.1 12/18/2013   MCV 94.6 12/18/2013   PLT 250 12/18/2013      Chemistry      Component Value Date/Time   NA 141 12/18/2013 1258   K 4.1 12/18/2013 1258   CO2 25 12/18/2013 1258   BUN 10.8 12/18/2013 1258   CREATININE 0.8 12/18/2013 1258      Component Value Date/Time   CALCIUM 9.5 12/18/2013 1258   ALKPHOS 67 12/18/2013 1258   AST 15 12/18/2013 1258   ALT 22 12/18/2013 1258   BILITOT 0.34 12/18/2013 1258      STUDIES:  ECHO 09/30/13 EF 55%, grade 2 diastolic dysfunction.  ASSESSMENT: 52 y.o. BRCA negative [place] woman status post left breast biopsy for 01/18/2014 showing invasive lobular carcinoma (E-cadherin negative), estrogen receptor 70% positive, progesterone receptor 70% positive, with an MIB-1 of 7%, and no HER-2 amplification  (1) ultrasound-guided biopsy of a suspicious lymph node in the left axilla 08/19/2012 was positive  (2) status post left lumpectomy and axillary lymph node dissection 09/09/2013 for a pT1c pN2, stage IIIa invasive lobular carcinoma, grade 1, repeat HER-2 again negative.  She has 8/23 LN +, LVI+,PNI+.  (4) Adjuvant chemotherapy consists of doxorubicin and cyclophosphamide in dose dense fashion x4, with Neulasta support, followed by paclitaxel in dose dense fashion x4, again with Neulasta support.   (5) radiation to follow chemotherapy  (6) antiestrogen therapy to follow radiation   PLAN:  [name] [name] is doing well today.  Her labs have recovered last week and she is no longer anemic and neutropenic.  She is feeling well today and will proceed with her fourth cycle of Doxorubicin and Cylophosphamide.    For her the rash underneath her breasts, I prescribed nystatin/triamcinolone cream to apply underneath as it may be fungal.  Its hard to tell because of how many freckles she has.  We will monitor the lesions on her shoulders next week closely and see how/if they evolve.  She may need to see dermatology.    [name] [name] will return in one week for labs, and evaluaiton of chemotoxicities.  She knows to call us in the interim for any questions or concerns.  We can certainly see her sooner if needed.   I spent 25 minutes counseling the patient face to face.  The total time spent in the appointment was 30 minutes.  Lindsey N. Cornetto, NP Medical Oncology [facility] [phone]      

## 2013-12-18 NOTE — Patient Instructions (Signed)
Nystatin; Triamcinolone cream or ointment What is this medicine? NYSTATIN; TRIAMCINOLONE (nye STAT in; trye am SIN oh lone) is a combination of an antifungal medicine and a steroid. It is used to treat certain kinds of fungal or yeast infections of the skin. This medicine may be used for other purposes; ask your health care provider or pharmacist if you have questions. COMMON BRAND NAME(S): Myco-Triacet-II, Mycogen-II, Mycolog II, Mytrex, N.T.A. What should I tell my health care provider before I take this medicine? They need to know if you have any of these conditions: -large areas of burned or damaged skin -skin wasting or thinning -peripheral vascular disease or poor circulation -an unusual or allergic reaction to nystatin, triamcinolone, other corticosteroids, other medicines, foods, dyes, or preservatives -pregnant or trying to get pregnant -breast-feeding How should I use this medicine? This medicine is for external use only. Do not take by mouth. Follow the directions on the prescription label. Wash your hands before and after use. If treating hand or nail infections, wash hands before use only. Apply a thin layer of this medicine to the affected area and rub in gently. Do not use on healthy skin or over large areas of skin. Do not get this medicine in your eyes. If you do, rinse out with plenty of cool tap water. When applying to the groin area, apply a limited amount and do not use for longer than 2 weeks unless directed to by your doctor or health care professional. Do not cover or wrap the treated area with an airtight bandage (such as a plastic bandage). Use the full course of treatment prescribed, even if you think the infection is getting better. Use at regular intervals. Do not use your medicine more often than directed. Do not use this medicine for any condition other than the one for which it was prescribed. Talk to your pediatrician regarding the use of this medicine in children.  While this drug may be prescribed for selected conditions, precautions do apply. Children being treated in the diaper area should not wear tight-fitting diapers or plastic pants. Elderly patients are more likely to have damaged skin through aging, and this may increase side effects. This medicine should only be used for brief periods and infrequently in older patients. Overdosage: If you think you have taken too much of this medicine contact a poison control center or emergency room at once. NOTE: This medicine is only for you. Do not share this medicine with others. What if I miss a dose? If you miss a dose, use it as soon as you can. If it is almost time for your next dose, use only that dose. Do not use double or extra doses. What may interact with this medicine? Interactions are not expected. Do not use any other skin products on the affected area without telling your doctor or health care professional. This list may not describe all possible interactions. Give your health care provider a list of all the medicines, herbs, non-prescription drugs, or dietary supplements you use. Also tell them if you smoke, drink alcohol, or use illegal drugs. Some items may interact with your medicine. What should I watch for while using this medicine? Tell your doctor or health care professional if your symptoms do not start to get better within 1 week when treating the groin area or within 2 weeks when treating the feet. . Tell your doctor or health care professional if you develop sores or blisters that do not heal properly. If your skin  infection returns after stopping this medicine, contact your doctor or health care professional. If you are using this medicine to treat an infection in the groin area, do not wear underwear that is tight-fitting or made from synthetic fibers such as rayon or nylon. Instead, wear loose-fitting, cotton underwear. Also dry the area completely after bathing. What side effects may I  notice from receiving this medicine? Side effects that you should report to your doctor or health care professional as soon as possible: -burning or itching of the skin -dark red spots on the skin -loss of feeling on skin -painful, red, pus-filled blisters in hair follicles -skin infection -thinning of the skin or sunburn: more likely if applied to the face Side effects that usually do not require medical attention (report to your doctor or health care professional if they continue or are bothersome): -dry or peeling skin -skin irritation This list may not describe all possible side effects. Call your doctor for medical advice about side effects. You may report side effects to FDA at 1-800-FDA-1088. Where should I keep my medicine? Keep out of the reach of children. Store at room temperature between 15 and 30 degrees C (59 and 86 degrees F). Do not freeze. Throw away any unused medicine after the expiration date. NOTE: This sheet is a summary. It may not cover all possible information. If you have questions about this medicine, talk to your doctor, pharmacist, or health care provider.  2015, Elsevier/Gold Standard. (2007-11-08 17:29:26)

## 2013-12-18 NOTE — Patient Instructions (Signed)
[facility] Discharge Instructions for Patients Receiving Chemotherapy  Today you received the following chemotherapy agents: Adriamycin and cytoxan.  To help prevent nausea and vomiting after your treatment, we encourage you to take your nausea medication: Compazine 10mg  every 6 hours as needed.   If you develop nausea and vomiting that is not controlled by your nausea medication, call the clinic.   BELOW ARE SYMPTOMS THAT SHOULD BE REPORTED IMMEDIATELY:  *FEVER GREATER THAN 100.5 F  *CHILLS WITH OR WITHOUT FEVER  NAUSEA AND VOMITING THAT IS NOT CONTROLLED WITH YOUR NAUSEA MEDICATION  *UNUSUAL SHORTNESS OF BREATH  *UNUSUAL BRUISING OR BLEEDING  TENDERNESS IN MOUTH AND THROAT WITH OR WITHOUT PRESENCE OF ULCERS  *URINARY PROBLEMS  *BOWEL PROBLEMS  UNUSUAL RASH Items with * indicate a potential emergency and should be followed up as soon as possible.  Feel free to call the clinic you have any questions or concerns. The clinic phone number is [phone].

## 2013-12-19 ENCOUNTER — Ambulatory Visit (HOSPITAL_BASED_OUTPATIENT_CLINIC_OR_DEPARTMENT_OTHER): Payer: BC Managed Care – PPO

## 2013-12-19 VITALS — BP 122/81 | HR 90 | Temp 98.1°F

## 2013-12-19 DIAGNOSIS — Z5189 Encounter for other specified aftercare: Secondary | ICD-10-CM

## 2013-12-19 DIAGNOSIS — C50212 Malignant neoplasm of upper-inner quadrant of left female breast: Secondary | ICD-10-CM

## 2013-12-19 DIAGNOSIS — C50919 Malignant neoplasm of unspecified site of unspecified female breast: Secondary | ICD-10-CM

## 2013-12-19 MED ORDER — PEGFILGRASTIM INJECTION 6 MG/0.6ML
6.0000 mg | Freq: Once | SUBCUTANEOUS | Status: AC
  Administered 2013-12-19: 6 mg via SUBCUTANEOUS
  Filled 2013-12-19: qty 0.6

## 2013-12-26 ENCOUNTER — Other Ambulatory Visit (HOSPITAL_BASED_OUTPATIENT_CLINIC_OR_DEPARTMENT_OTHER): Payer: BC Managed Care – PPO

## 2013-12-26 ENCOUNTER — Telehealth: Payer: Self-pay | Admitting: *Deleted

## 2013-12-26 ENCOUNTER — Ambulatory Visit (HOSPITAL_BASED_OUTPATIENT_CLINIC_OR_DEPARTMENT_OTHER): Payer: BC Managed Care – PPO | Admitting: Oncology

## 2013-12-26 VITALS — BP 119/65 | HR 90 | Temp 98.2°F | Resp 18 | Ht 66.0 in | Wt 149.5 lb

## 2013-12-26 DIAGNOSIS — C50919 Malignant neoplasm of unspecified site of unspecified female breast: Secondary | ICD-10-CM

## 2013-12-26 DIAGNOSIS — C50212 Malignant neoplasm of upper-inner quadrant of left female breast: Secondary | ICD-10-CM

## 2013-12-26 DIAGNOSIS — F411 Generalized anxiety disorder: Secondary | ICD-10-CM

## 2013-12-26 DIAGNOSIS — C773 Secondary and unspecified malignant neoplasm of axilla and upper limb lymph nodes: Secondary | ICD-10-CM

## 2013-12-26 DIAGNOSIS — Z170 Estrogen receptor positive status [ER+]: Secondary | ICD-10-CM

## 2013-12-26 DIAGNOSIS — D702 Other drug-induced agranulocytosis: Secondary | ICD-10-CM

## 2013-12-26 LAB — CBC WITH DIFFERENTIAL/PLATELET
BASO%: 1.7 % (ref 0.0–2.0)
BASOS ABS: 0 10*3/uL (ref 0.0–0.1)
EOS ABS: 0 10*3/uL (ref 0.0–0.5)
EOS%: 0.8 % (ref 0.0–7.0)
HEMATOCRIT: 31 % — AB (ref 34.8–46.6)
HGB: 10.5 g/dL — ABNORMAL LOW (ref 11.6–15.9)
LYMPH%: 31.4 % (ref 14.0–49.7)
MCH: 32.1 pg (ref 25.1–34.0)
MCHC: 33.9 g/dL (ref 31.5–36.0)
MCV: 94.8 fL (ref 79.5–101.0)
MONO#: 0.2 10*3/uL (ref 0.1–0.9)
MONO%: 14.9 % — AB (ref 0.0–14.0)
NEUT%: 51.2 % (ref 38.4–76.8)
NEUTROS ABS: 0.6 10*3/uL — AB (ref 1.5–6.5)
Platelets: 204 10*3/uL (ref 145–400)
RBC: 3.27 10*6/uL — AB (ref 3.70–5.45)
RDW: 14.7 % — ABNORMAL HIGH (ref 11.2–14.5)
WBC: 1.2 10*3/uL — AB (ref 3.9–10.3)
lymph#: 0.4 10*3/uL — ABNORMAL LOW (ref 0.9–3.3)
nRBC: 0 % (ref 0–0)

## 2013-12-26 LAB — COMPREHENSIVE METABOLIC PANEL (CC13)
ALT: 43 U/L (ref 0–55)
ANION GAP: 8 meq/L (ref 3–11)
AST: 21 U/L (ref 5–34)
Albumin: 3.6 g/dL (ref 3.5–5.0)
Alkaline Phosphatase: 68 U/L (ref 40–150)
BILIRUBIN TOTAL: 0.55 mg/dL (ref 0.20–1.20)
BUN: 8.9 mg/dL (ref 7.0–26.0)
CALCIUM: 8.9 mg/dL (ref 8.4–10.4)
CHLORIDE: 107 meq/L (ref 98–109)
CO2: 26 mEq/L (ref 22–29)
CREATININE: 0.7 mg/dL (ref 0.6–1.1)
GLUCOSE: 97 mg/dL (ref 70–140)
Potassium: 4.1 mEq/L (ref 3.5–5.1)
Sodium: 142 mEq/L (ref 136–145)
Total Protein: 6.4 g/dL (ref 6.4–8.3)

## 2013-12-26 NOTE — Telephone Encounter (Signed)
Per staff message and POF I have scheduled appts. Advised scheduler of appts. JMW  

## 2013-12-26 NOTE — Progress Notes (Signed)
[place]  Telephone:([phone]) [phone] Fax:(336) [phone]     ID: [name] [name] OB: [birth year]  MR#: [id number]  SFK#:[id number]  PCP: [name]., MD GYN:  [name] [name] SU: Excell Seltzer OTHER MD: [name]  CHIEF COMPLAINT: Locally advanced breast cancer for chemotherapy f/u. CURRENT TREATMENT: adjuvant chemotherapy  BREAST CANCER HISTORY:  [name] [name] is 53 years old and she first noted a change, like a small hard pea in her left axilla. She brought this to Dr. [name] attention and bilateral diagnostic mammography and left ultrasonography at the [facility] Center for 07/18/2013 showed a small area of asymmetry in the inner left breast measuring perhaps 5 mm which was not palpable by physical exam. In the right breast there was some loosely grouped calcifications spanning up to 0.7 cm. Ultrasound of the left breast confirmed an ill-defined area of shadowing at the 9:00 position measuring up to 1.3 cm. There was also a 4 mm circumscribed hypoechoic mass at the 10:00 position. This is felt to be probably benign. In the left axilla there were 2 lymph nodes with slightly increased cortical thickening, although the fatty hila were maintained. These were not related to the patient's feeling of a "pea like mass" in her left axilla, which had by then pretty much resolved.   On 08/07/2013 the patient underwent left breast needle core biopsy and this showed ([id number]) and invasive lobular carcinoma (E-cadherin negative) estrogen receptor 70% positive with strong staining intensity, progesterone receptor 70% positive and strong staining intensity, with a proliferation marker of 7% and no HER-2 amplification, the signals ratio being 0.94 in the number per cell 1.50.  Bilateral breast MRI 08/15/2013 found an irregular spiculated mass in the left breast measuring approximately 1.5 cm. There was no abnormal enhancement of the pectoralis. There were at least 2 left axillary lymph nodes and showed  focal cortical thickening. There was a single left internal mammary lymph node at the level of the biopsy-proven malignancy which was not pathologic in appearance. The right breast and axilla were benign.  Genetics testing may 2015 was normal and did not reveal a mutation in the genes tested: ATM, BARD1, BRCA1, BRCA2, BRIP1, CDH1, CHEK2, MRE11A, MUTYH, NBN, NF1, PALB2, PTEN, RAD50, RAD51C, RAD51D, and TP53. She is status post left lumpectomy and axillary lymph node dissection by Dr Excell Seltzer 09/09/2013 for a pT1c pN2, stage IIIa invasive lobular carcinoma, grade 1, repeat HER-2 again negative. Patient had 8/23 LN positive and also evidence of LVI (lymphovascular invasion) and PNI (perineural invasion).  INTERVAL HISTORY:  [name] [name] returns her husband [name] [name] for followup of her breast cancer. Today is day 9 cycle 4 of 4 planned cycles of cyclophosphamide and doxorubicin given in dose dense fashion with Neulasta support. This is to be followed by taxanes therapy either every 2 weeks also in dose dose dense fashion or weekly x12  REVIEW OF SYSTEMS: [name] [name] did well with cycle 4 except that she was a little bit more tired than usual. She describes her first DD as mild however. She continues to work full-time. She developed a rash in her right hand which is resolving without any special intervention. She does get pain from the Neulasta shot, but handles that with nonsteroidals. Sometimes she wakes up at night with a dull headache which is bifrontal. It feels like a "caffeine withdrawal" headache and in fact she has cut down her caffeine intake. Sometimes rise water a little. She has been moderately constipated. Otherwise a detailed review of systems today was stable  PAST MEDICAL HISTORY: Past Medical History  Diagnosis Date  . Palpitations 2009    Improved; Secondary to premature ventricular contractions. No problems since-pt states was caring for ailing father, anxiety  . Anxiety disorder     Mild: treated  by her gynecologist with selective serotonin reuptake inhibitor  . Nonspecific abnormal electrocardiogram (ECG) (EKG) 2009  . GERD (gastroesophageal reflux disease)     rolaids  . Hot flashes   . Malignant neoplasm of breast (female), unspecified site   . Family history of malignant neoplasm of breast   . Arthritis     hands and feet  . Wears glasses     PAST SURGICAL HISTORY: Past Surgical History  Procedure Laterality Date  . Thyroidectomy, partial    . Cesarean section    . Endometrial ablation    . Dilation and curettage of uterus    . Laparoscopic assisted vaginal hysterectomy  02/07/2011    Procedure: LAPAROSCOPIC ASSISTED VAGINAL HYSTERECTOMY;  Surgeon: [name] [name];  Location: [facility];  Service: Gynecology;  Laterality: N/A;  . Abdominal hysterectomy  02/07/2011    Procedure: HYSTERECTOMY ABDOMINAL;  Surgeon: [name] [name];  Location: [facility];  Service: Gynecology;  Laterality: N/A;  . Breast lumpectomy with needle localization and axillary lymph node dissection Left 09/09/2013    Procedure: LEFT BREAST LUMPECTOMY WITH NEEDLE LOCALIZATION AND LEFT AXILLARY LYMPH NODE DISSECTION;  Surgeon: [name], MD;  Location: [facility];  Service: General;  Laterality: Left;  . Portacath placement Right 09/29/2013    Procedure: INSERTION PORT-A-CATH;  Surgeon: [name], MD;  Location: [facility];  Service: General;  Laterality: Right;    FAMILY HISTORY Family History  Problem Relation Age of Onset  . Angina Father   . Aortic aneurysm Father   . Dementia Father   . Arrhythmia Maternal Aunt     A fib  . Breast cancer Maternal Aunt 56    currently 1  . Hypertension Other   . Arrhythmia Other     Uncle- a fib  . Arrhythmia Maternal Aunt     A fib  . Breast cancer Maternal Aunt     dx 36s; currently 61s  . Breast cancer Mother     dx 69s. Died at 57  . Colon cancer Maternal Grandmother     dx 30s; died at 56  . Colon cancer  Maternal Grandfather     dx 40s; died in 19s  . Cancer Paternal Uncle     unknown primary in early 28s  . Breast cancer Maternal Aunt     dx 59s; died in 50s  The patient's father died from heart disease in the setting of dementia at the age of 57. The patient's mother died at the age of 71. She had been diagnosed with breast cancer at the age of 30. The patient is a single child. Her mother had 4 sisters, 3 of whom had breast cancer diagnosed at the age of 54, 19, and 69. There is also history of colorectal cancer on the maternal side of the family. BRCA testing negative.  GYNECOLOGIC HISTORY:   menarche age 4, first live birth age 64, the patient is [place] P2. She underwent a hysterectomy without salpingo-oophorectomy in 2012. 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 Her husband [name] [name] also works for [employer], running [employer].Marland Kitchen Son  [name] [name] lives in [place] where he works as a Public relations account executive. Daughter [name] [name] this in [place], where she works as an Engineering geologist in a [employer]. The patient has no grandchildren. She is a Information systems manager.   ADVANCED DIRECTIVES: Not in place   HEALTH MAINTENANCE: History  Substance Use Topics  . Smoking status: Never Smoker   . Smokeless tobacco: Not on file  . Alcohol Use: Yes     Comment: drinks 1-2 beers a week     Colonoscopy:2010   PAP:2012, s/p hysterectomy.  Bone density:2012   Lipid panel: 11/05/2013 TC 219, HDL 41, LDL 137, TG 204  No Known Allergies  Current Outpatient Prescriptions  Medication Sig Dispense Refill  . calcium carbonate-magnesium hydroxide (ROLAIDS) 334 MG CHEW Chew 1 tablet by mouth 2 (two) times daily as needed. For indigestion       . Cholecalciferol (VITAMIN D) 2000 UNITS tablet Take 2,000 Units by mouth daily.      . ciprofloxacin (CIPRO) 500 MG tablet Take 1 tablet (500 mg total) by mouth 2  (two) times daily.  14 tablet  0  . dexamethasone (DECADRON) 4 MG tablet Take 4 mg by mouth 2 (two) times daily with a meal.      . diclofenac (VOLTAREN) 75 MG EC tablet Take 75 mg by mouth as needed.      . gabapentin (NEURONTIN) 300 MG capsule Take 1 capsule (300 mg total) by mouth at bedtime.  30 capsule  6  . lidocaine-prilocaine (EMLA) cream Apply 1 application topically as needed. Apply over port 1-2 hours before chemo, cover with plastic wrap  30 g  0  . loratadine (CLARITIN) 10 MG tablet Take 10 mg by mouth daily.      Marland Kitchen LORazepam (ATIVAN) 0.5 MG tablet Take 1 tablet (0.5 mg total) by mouth at bedtime as needed (Nausea or vomiting).  30 tablet  0  . NON FORMULARY       . nystatin-triamcinolone (MYCOLOG II) cream Apply 1 application topically 2 (two) times daily.  30 g  0  . oxyCODONE (OXY IR/ROXICODONE) 5 MG immediate release tablet Take 1-2 tablets (5-10 mg total) by mouth every 6 (six) hours as needed.  30 tablet  0  . prochlorperazine (COMPAZINE) 10 MG tablet Take 1 tablet (10 mg total) by mouth every 6 (six) hours as needed (Nausea or vomiting).  30 tablet  1   No current facility-administered medications for this visit.    OBJECTIVE: middle-aged white woman in no acute distress Filed Vitals:   12/26/13 0852  BP: 119/65  Pulse: 90  Temp: 98.2 F (36.8 C)  Resp: 18     Body mass index is 24.14 kg/(m^2).    ECOG FS:1 - Symptomatic but completely ambulatory  Sclerae unicteric, pupils are round and equal Oropharynx clear and moist-- no lesions noted No cervical or supraclavicular adenopathy Lungs no rales or rhonchi Heart regular rate and rhythm Abd soft, nontender, positive bowel sounds MSK no focal spinal tenderness, no upper extremity lymphedema Neuro: nonfocal, well oriented, appropriate affect Breasts: Deferred  LAB RESULTS:  CMP     Component Value Date/Time   NA 141 12/18/2013 1258   K 4.1 12/18/2013 1258   CO2 25 12/18/2013 1258   GLUCOSE 95 12/18/2013 1258    BUN 10.8 12/18/2013 1258   CREATININE 0.8 12/18/2013 1258   CALCIUM 9.5 12/18/2013 1258   PROT 7.2 12/18/2013 1258   ALBUMIN 4.1 12/18/2013 1258   AST 15 12/18/2013 1258  ALT 22 12/18/2013 1258   ALKPHOS 67 12/18/2013 1258   BILITOT 0.34 12/18/2013 1258    I No results found for this basename: SPEP,  UPEP,   kappa and lambda light chains    Lab Results  Component Value Date   WBC 1.2* 12/26/2013   NEUTROABS 0.6* 12/26/2013   HGB 10.5* 12/26/2013   HCT 31.0* 12/26/2013   MCV 94.8 12/26/2013   PLT 204 12/26/2013      Chemistry      Component Value Date/Time   NA 141 12/18/2013 1258   K 4.1 12/18/2013 1258   CO2 25 12/18/2013 1258   BUN 10.8 12/18/2013 1258   CREATININE 0.8 12/18/2013 1258      Component Value Date/Time   CALCIUM 9.5 12/18/2013 1258   ALKPHOS 67 12/18/2013 1258   AST 15 12/18/2013 1258   ALT 22 12/18/2013 1258   BILITOT 0.34 12/18/2013 1258      STUDIES: No results found.   ASSESSMENT: 53 y.o. BRCA negative [place] woman status post left breast biopsy for 01/18/2014 showing invasive lobular carcinoma (E-cadherin negative), estrogen receptor 70% positive, progesterone receptor 70% positive, with an MIB-1 of 7%, and no HER-2 amplification  (1) ultrasound-guided biopsy of a suspicious lymph node in the left axilla 08/19/2012 was positive  (2) status post left lumpectomy and axillary lymph node dissection 09/09/2013 for a pT1c pN2, stage IIIa invasive lobular carcinoma, grade 1, repeat HER-2 again negative. [She had 8 of 23 axillary lymph nodes involved]  (4) Adjuvant chemotherapy consisting of doxorubicin and cyclophosphamide in dose dense fashion x4, with Neulasta support, completed 12/18/2013; to be followed by paclitaxel weekly x 12  (5) radiation to follow chemotherapy  (6) antiestrogen therapy for 10 years to follow radiation   PLAN:  Vayda did well with her chemotherapy so far, certainly with no evidence of congestive heart failure or other possibly permanent  organ damage. However she has noted increasing fatigue. She feels if she were to continue the next 4 treatments in dose dense fashion, even though it will be one drug instead of 2, she would tolerated poorly. She likes city of 2 weekly treatments, which did not involve Neulasta, and which are certainly much better tolerated and at least as effective as the every 2 week treatments (there are certainly more effective than the every three-week treatments against which there were compared previously)  Accordingly the plan will be for paclitaxel weekly x12. We discussed the possible toxicities, side effects and complications of this agent with particular emphasis on the issue of neuropathy. The patient understands sometimes there is a "first dose effect", with aches and pains and possibly an immune reaction, but that these generally do not occur with subsequent doses.  I generally recommend that we continue the supportive medications as 4 doxorubicin and cyclophosphamide through the first paclitaxel cycle, and then if everything looks good after the first cycle, discontinue the oral dexamethasone. I will also drop the intravenous dexamethasone dose given with the Taxol premeds from 20-4 over the first 3 doses assuming the patient tolerates the Taxol well.  Aretta understands the overall plan well and agrees with it. She knows the goal of treatment in her case is cure. She will call with any problems that may develop before her next visit here.

## 2013-12-31 ENCOUNTER — Telehealth: Payer: Self-pay | Admitting: Nurse Practitioner

## 2013-12-31 NOTE — Telephone Encounter (Signed)
cld pt & left message of appts for 9/4-

## 2014-01-02 ENCOUNTER — Other Ambulatory Visit: Payer: Self-pay | Admitting: Oncology

## 2014-01-02 ENCOUNTER — Other Ambulatory Visit (HOSPITAL_BASED_OUTPATIENT_CLINIC_OR_DEPARTMENT_OTHER): Payer: BC Managed Care – PPO

## 2014-01-02 ENCOUNTER — Ambulatory Visit (HOSPITAL_BASED_OUTPATIENT_CLINIC_OR_DEPARTMENT_OTHER): Payer: BC Managed Care – PPO

## 2014-01-02 ENCOUNTER — Ambulatory Visit (HOSPITAL_BASED_OUTPATIENT_CLINIC_OR_DEPARTMENT_OTHER): Payer: BC Managed Care – PPO | Admitting: Nurse Practitioner

## 2014-01-02 ENCOUNTER — Other Ambulatory Visit: Payer: Self-pay | Admitting: *Deleted

## 2014-01-02 ENCOUNTER — Encounter: Payer: Self-pay | Admitting: Nurse Practitioner

## 2014-01-02 VITALS — BP 128/79 | HR 88 | Temp 98.1°F | Resp 16

## 2014-01-02 VITALS — BP 118/74 | HR 94 | Temp 98.1°F | Resp 18 | Ht 66.0 in | Wt 147.9 lb

## 2014-01-02 DIAGNOSIS — C50919 Malignant neoplasm of unspecified site of unspecified female breast: Secondary | ICD-10-CM

## 2014-01-02 DIAGNOSIS — J3489 Other specified disorders of nose and nasal sinuses: Secondary | ICD-10-CM

## 2014-01-02 DIAGNOSIS — Z5111 Encounter for antineoplastic chemotherapy: Secondary | ICD-10-CM

## 2014-01-02 DIAGNOSIS — C773 Secondary and unspecified malignant neoplasm of axilla and upper limb lymph nodes: Secondary | ICD-10-CM

## 2014-01-02 DIAGNOSIS — C50212 Malignant neoplasm of upper-inner quadrant of left female breast: Secondary | ICD-10-CM

## 2014-01-02 DIAGNOSIS — F411 Generalized anxiety disorder: Secondary | ICD-10-CM

## 2014-01-02 LAB — COMPREHENSIVE METABOLIC PANEL (CC13)
ALBUMIN: 3.8 g/dL (ref 3.5–5.0)
ALT: 28 U/L (ref 0–55)
AST: 15 U/L (ref 5–34)
Alkaline Phosphatase: 56 U/L (ref 40–150)
Anion Gap: 7 mEq/L (ref 3–11)
BUN: 12.9 mg/dL (ref 7.0–26.0)
CALCIUM: 8.8 mg/dL (ref 8.4–10.4)
CHLORIDE: 107 meq/L (ref 98–109)
CO2: 28 meq/L (ref 22–29)
Creatinine: 0.8 mg/dL (ref 0.6–1.1)
Glucose: 115 mg/dl (ref 70–140)
Potassium: 3.6 mEq/L (ref 3.5–5.1)
Sodium: 142 mEq/L (ref 136–145)
Total Bilirubin: 0.4 mg/dL (ref 0.20–1.20)
Total Protein: 6.7 g/dL (ref 6.4–8.3)

## 2014-01-02 LAB — CBC WITH DIFFERENTIAL/PLATELET
BASO%: 0.7 % (ref 0.0–2.0)
BASOS ABS: 0.1 10*3/uL (ref 0.0–0.1)
EOS%: 0.1 % (ref 0.0–7.0)
Eosinophils Absolute: 0 10*3/uL (ref 0.0–0.5)
HEMATOCRIT: 33.9 % — AB (ref 34.8–46.6)
HEMOGLOBIN: 11.2 g/dL — AB (ref 11.6–15.9)
LYMPH#: 0.9 10*3/uL (ref 0.9–3.3)
LYMPH%: 10.3 % — ABNORMAL LOW (ref 14.0–49.7)
MCH: 32.2 pg (ref 25.1–34.0)
MCHC: 33 g/dL (ref 31.5–36.0)
MCV: 97.4 fL (ref 79.5–101.0)
MONO#: 0.6 10*3/uL (ref 0.1–0.9)
MONO%: 7.2 % (ref 0.0–14.0)
NEUT#: 6.8 10*3/uL — ABNORMAL HIGH (ref 1.5–6.5)
NEUT%: 81.7 % — ABNORMAL HIGH (ref 38.4–76.8)
Platelets: 164 10*3/uL (ref 145–400)
RBC: 3.48 10*6/uL — ABNORMAL LOW (ref 3.70–5.45)
RDW: 16.7 % — AB (ref 11.2–14.5)
WBC: 8.3 10*3/uL (ref 3.9–10.3)

## 2014-01-02 MED ORDER — SODIUM CHLORIDE 0.9 % IJ SOLN
10.0000 mL | INTRAMUSCULAR | Status: DC | PRN
  Administered 2014-01-02: 10 mL
  Filled 2014-01-02: qty 10

## 2014-01-02 MED ORDER — ONDANSETRON 8 MG/50ML IVPB (CHCC)
8.0000 mg | Freq: Once | INTRAVENOUS | Status: AC
  Administered 2014-01-02: 8 mg via INTRAVENOUS

## 2014-01-02 MED ORDER — DIPHENHYDRAMINE HCL 50 MG/ML IJ SOLN
INTRAMUSCULAR | Status: AC
  Filled 2014-01-02: qty 1

## 2014-01-02 MED ORDER — LORAZEPAM 0.5 MG PO TABS: 0.5000 mg | ORAL_TABLET | Freq: Every day | ORAL | Status: DC

## 2014-01-02 MED ORDER — SODIUM CHLORIDE 0.9 % IV SOLN
Freq: Once | INTRAVENOUS | Status: AC
  Administered 2014-01-02: 13:00:00 via INTRAVENOUS

## 2014-01-02 MED ORDER — PACLITAXEL CHEMO INJECTION 300 MG/50ML
80.0000 mg/m2 | Freq: Once | INTRAVENOUS | Status: AC
  Administered 2014-01-02: 144 mg via INTRAVENOUS
  Filled 2014-01-02: qty 24

## 2014-01-02 MED ORDER — ONDANSETRON 8 MG/NS 50 ML IVPB
INTRAVENOUS | Status: AC
  Filled 2014-01-02: qty 8

## 2014-01-02 MED ORDER — DEXAMETHASONE SODIUM PHOSPHATE 20 MG/5ML IJ SOLN
INTRAMUSCULAR | Status: AC
  Filled 2014-01-02: qty 5

## 2014-01-02 MED ORDER — DIPHENHYDRAMINE HCL 50 MG/ML IJ SOLN
25.0000 mg | Freq: Once | INTRAMUSCULAR | Status: AC
  Administered 2014-01-02: 25 mg via INTRAVENOUS

## 2014-01-02 MED ORDER — DEXAMETHASONE SODIUM PHOSPHATE 20 MG/5ML IJ SOLN
20.0000 mg | Freq: Once | INTRAMUSCULAR | Status: AC
  Administered 2014-01-02: 20 mg via INTRAVENOUS

## 2014-01-02 MED ORDER — FAMOTIDINE IN NACL 20-0.9 MG/50ML-% IV SOLN
INTRAVENOUS | Status: AC
  Filled 2014-01-02: qty 50

## 2014-01-02 MED ORDER — HEPARIN SOD (PORK) LOCK FLUSH 100 UNIT/ML IV SOLN
500.0000 [IU] | Freq: Once | INTRAVENOUS | Status: AC | PRN
  Administered 2014-01-02: 500 [IU]
  Filled 2014-01-02: qty 5

## 2014-01-02 MED ORDER — FAMOTIDINE IN NACL 20-0.9 MG/50ML-% IV SOLN
20.0000 mg | Freq: Once | INTRAVENOUS | Status: AC
  Administered 2014-01-02: 20 mg via INTRAVENOUS

## 2014-01-02 NOTE — Progress Notes (Signed)
[facility]  [phone] [phone]     ID: [name] [name] OB: [birth year]  MR#: [id number]  OVZ#:[id number]  PCP: [name]., MD GYN:  [name] SU: Excell Seltzer OTHER MD: [name]  CHIEF COMPLAINT: Locally advanced breast cancer for chemotherapy f/u. CURRENT TREATMENT: adjuvant chemotherapy  BREAST CANCER HISTORY:  [name] [name] is 53 years old and she first noted a change, like a small hard pea in her left axilla. She brought this to Dr. [name] attention and bilateral diagnostic mammography and left ultrasonography at the breast Center for 07/18/2013 showed a small area of asymmetry in the inner left breast measuring perhaps 5 mm which was not palpable by physical exam. In the right breast there was some loosely grouped calcifications spanning up to 0.7 cm. Ultrasound of the left breast confirmed an ill-defined area of shadowing at the 9:00 position measuring up to 1.3 cm. There was also a 4 mm circumscribed hypoechoic mass at the 10:00 position. This is felt to be probably benign. In the left axilla there were 2 lymph nodes with slightly increased cortical thickening, although the fatty hila were maintained. These were not related to the patient's feeling of a "pea like mass" in her left axilla, which had by then pretty much resolved.   On 08/07/2013 the patient underwent left breast needle core biopsy and this showed ([id number]) and invasive lobular carcinoma (E-cadherin negative) estrogen receptor 70% positive with strong staining intensity, progesterone receptor 70% positive and strong staining intensity, with a proliferation marker of 7% and no HER-2 amplification, the signals ratio being 0.94 in the number per cell 1.50.  Bilateral breast MRI 08/15/2013 found an irregular spiculated mass in the left breast measuring approximately 1.5 cm. There was no abnormal enhancement of the pectoralis. There were at least 2 left axillary lymph nodes and showed  focal cortical thickening. There was a single left internal mammary lymph node at the level of the biopsy-proven malignancy which was not pathologic in appearance. The right breast and axilla were benign.  Genetics testing may 2015 was normal and did not reveal a mutation in the genes tested: ATM, BARD1, BRCA1, BRCA2, BRIP1, CDH1, CHEK2, MRE11A, MUTYH, NBN, NF1, PALB2, PTEN, RAD50, RAD51C, RAD51D, and TP53. She is status post left lumpectomy and axillary lymph node dissection by Dr Excell Seltzer 09/09/2013 for a pT1c pN2, stage IIIa invasive lobular carcinoma, grade 1, repeat HER-2 again negative. Patient had 8/23 LN positive and also evidence of LVI (lymphovascular invasion) and PNI (perineural invasion).  INTERVAL HISTORY: [name] [name] returns with her husband [name] [name], for follow up of her breast cancer. Today is day 1, cycle 1 of 12 planned cycles of paclitaxel. [name] did well with her previous chemo regimen of doxorubicin and cyclophosphamide.   REVIEW OF SYSTEMS: [name] denies fevers, chills, nausea, vomiting, or changes in bowel or bladder habits. She denies shortness of breath, chest pain, cough or fatigue. Her hot flashes are managed with gabapentin. She continues to treat the small rash underneath her breasts an on her shoulders with the nystatin/triamcinolone cream. Her main complaint now are sinus symptoms such as runny nose and watery eyes. A detailed review of systems is otherwise noncontributory  PAST MEDICAL HISTORY: Past Medical History  Diagnosis Date  . Palpitations 2009    Improved; Secondary to premature ventricular contractions. No problems since-pt states was caring for ailing father, anxiety  . Anxiety disorder     Mild: treated by her gynecologist with selective serotonin reuptake inhibitor  . Nonspecific abnormal  electrocardiogram (ECG) (EKG) 2009  . GERD (gastroesophageal reflux disease)     rolaids  . Hot flashes   . Malignant neoplasm of breast (female), unspecified site   .  Family history of malignant neoplasm of breast   . Arthritis     hands and feet  . Wears glasses     PAST SURGICAL HISTORY: Past Surgical History  Procedure Laterality Date  . Thyroidectomy, partial    . Cesarean section    . Endometrial ablation    . Dilation and curettage of uterus    . Laparoscopic assisted vaginal hysterectomy  02/07/2011    Procedure: LAPAROSCOPIC ASSISTED VAGINAL HYSTERECTOMY;  Surgeon: [name];  Location: [facility];  Service: Gynecology;  Laterality: N/A;  . Abdominal hysterectomy  02/07/2011    Procedure: HYSTERECTOMY ABDOMINAL;  Surgeon: [name];  Location: [facility];  Service: Gynecology;  Laterality: N/A;  . Breast lumpectomy with needle localization and axillary lymph node dissection Left 09/09/2013    Procedure: LEFT BREAST LUMPECTOMY WITH NEEDLE LOCALIZATION AND LEFT AXILLARY LYMPH NODE DISSECTION;  Surgeon: [name], MD;  Location: [facility];  Service: General;  Laterality: Left;  . Portacath placement Right 09/29/2013    Procedure: INSERTION PORT-A-CATH;  Surgeon: [name], MD;  Location: [facility];  Service: General;  Laterality: Right;    FAMILY HISTORY Family History  Problem Relation Age of Onset  . Angina Father   . Aortic aneurysm Father   . Dementia Father   . Arrhythmia Maternal Aunt     A fib  . Breast cancer Maternal Aunt 56    currently 10  . Hypertension Other   . Arrhythmia Other     Uncle- a fib  . Arrhythmia Maternal Aunt     A fib  . Breast cancer Maternal Aunt     dx 62s; currently 87s  . Breast cancer Mother     dx 75s. Died at 21  . Colon cancer Maternal Grandmother     dx 15s; died at 47  . Colon cancer Maternal Grandfather     dx 64s; died in 61s  . Cancer Paternal Uncle     unknown primary in early 50s  . Breast cancer Maternal Aunt     dx 39s; died in 47s  The patient's father died from heart disease in the setting of dementia at the age of 10. The  patient's mother died at the age of 62. She had been diagnosed with breast cancer at the age of 57. The patient is a single child. Her mother had 4 sisters, 3 of whom had breast cancer diagnosed at the age of 61, 47, and 21. There is also history of colorectal cancer on the maternal side of the family. BRCA testing negative.  GYNECOLOGIC HISTORY:   menarche age 57, first live birth age 80, the patient is [place] P2. She underwent a hysterectomy without salpingo-oophorectomy in 2012. 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 Her husband [name] [name] also works for [employer], running [employer].. Evorn Gong lives in [place] where he works as a Public relations account executive. Daughter Darrick Penna this in [place], where she works as an Engineering geologist in a [employer]. The patient has no grandchildren. She is a Information systems manager.   ADVANCED DIRECTIVES: Not in place   HEALTH MAINTENANCE: History  Substance Use Topics  .  Smoking status: Never Smoker   . Smokeless tobacco: Not on file  . Alcohol Use: Yes     Comment: drinks 1-2 beers a week     Colonoscopy:2010   PAP:2012, s/p hysterectomy.  Bone density:2012   Lipid panel: 11/05/2013 TC 219, HDL 41, LDL 137, TG 204  No Known Allergies  Current Outpatient Prescriptions  Medication Sig Dispense Refill  . calcium carbonate-magnesium hydroxide (ROLAIDS) 334 MG CHEW Chew 1 tablet by mouth 2 (two) times daily as needed. For indigestion       . Cholecalciferol (VITAMIN D) 2000 UNITS tablet Take 2,000 Units by mouth daily.      Marland Kitchen dexamethasone (DECADRON) 4 MG tablet Take 4 mg by mouth 2 (two) times daily with a meal.      . loratadine (CLARITIN) 10 MG tablet Take 10 mg by mouth daily.      Marland Kitchen LORazepam (ATIVAN) 0.5 MG tablet Take 1 tablet (0.5 mg total) by mouth at bedtime.  30 tablet  0  . NON FORMULARY       . nystatin-triamcinolone (MYCOLOG II) cream  Apply 1 application topically 2 (two) times daily.  30 g  0  . prochlorperazine (COMPAZINE) 10 MG tablet Take 10 mg by mouth every 6 (six) hours as needed for nausea or vomiting.      . ciprofloxacin (CIPRO) 500 MG tablet Take 1 tablet (500 mg total) by mouth 2 (two) times daily.  14 tablet  0  . diclofenac (VOLTAREN) 75 MG EC tablet Take 75 mg by mouth as needed.      . gabapentin (NEURONTIN) 300 MG capsule Take 1 capsule (300 mg total) by mouth at bedtime.  30 capsule  6  . oxyCODONE (OXY IR/ROXICODONE) 5 MG immediate release tablet Take 1-2 tablets (5-10 mg total) by mouth every 6 (six) hours as needed.  30 tablet  0   No current facility-administered medications for this visit.    OBJECTIVE: middle-aged white woman in no acute distress Filed Vitals:   01/02/14 1112  BP: 118/74  Pulse: 94  Temp: 98.1 F (36.7 C)  Resp: 18     Body mass index is 23.88 kg/(m^2).    ECOG FS:1 - Symptomatic but completely ambulatory  Skin: warm, dry, mild papular rash beneath breasts and on each shoulder. HEENT: sclerae anicteric, conjunctivae pink, oropharynx clear. No thrush or mucositis.  Lymph Nodes: No cervical or supraclavicular lymphadenopathy  Lungs: clear to auscultation bilaterally, no rales, wheezes, or rhonci  Heart: regular rate and rhythm  Abdomen: round, soft, non tender, positive bowel sounds  Musculoskeletal: No focal spinal tenderness, no peripheral edema  Neuro: non focal, well oriented, positive affect  Breasts: deferred  LAB RESULTS:  CMP     Component Value Date/Time   NA 142 01/02/2014 1054   K 3.6 01/02/2014 1054   CO2 28 01/02/2014 1054   GLUCOSE 115 01/02/2014 1054   BUN 12.9 01/02/2014 1054   CREATININE 0.8 01/02/2014 1054   CALCIUM 8.8 01/02/2014 1054   PROT 6.7 01/02/2014 1054   ALBUMIN 3.8 01/02/2014 1054   AST 15 01/02/2014 1054   ALT 28 01/02/2014 1054   ALKPHOS 56 01/02/2014 1054   BILITOT 0.40 01/02/2014 1054    I No results found for this basename: SPEP,  UPEP,   kappa and  lambda light chains    Lab Results  Component Value Date   WBC 8.3 01/02/2014   NEUTROABS 6.8* 01/02/2014   HGB 11.2* 01/02/2014   HCT 33.9*  01/02/2014   MCV 97.4 01/02/2014   PLT 164 01/02/2014      Chemistry      Component Value Date/Time   NA 142 01/02/2014 1054   K 3.6 01/02/2014 1054   CO2 28 01/02/2014 1054   BUN 12.9 01/02/2014 1054   CREATININE 0.8 01/02/2014 1054      Component Value Date/Time   CALCIUM 8.8 01/02/2014 1054   ALKPHOS 56 01/02/2014 1054   AST 15 01/02/2014 1054   ALT 28 01/02/2014 1054   BILITOT 0.40 01/02/2014 1054      STUDIES: No results found.   ASSESSMENT: 53 y.o. BRCA negative [place] woman status post left breast biopsy for 01/18/2014 showing invasive lobular carcinoma (E-cadherin negative), estrogen receptor 70% positive, progesterone receptor 70% positive, with an MIB-1 of 7%, and no HER-2 amplification  (1) ultrasound-guided biopsy of a suspicious lymph node in the left axilla 08/19/2012 was positive  (2) status post left lumpectomy and axillary lymph node dissection 09/09/2013 for a pT1c pN2, stage IIIa invasive lobular carcinoma, grade 1, repeat HER-2 again negative. [She had 8 of 23 axillary lymph nodes involved]  (4) Adjuvant chemotherapy consisting of doxorubicin and cyclophosphamide in dose dense fashion x4, with Neulasta support, completed 12/18/2013; to be followed by paclitaxel weekly x 12  (5) radiation to follow chemotherapy  (6) antiestrogen therapy for 10 years to follow radiation   PLAN: Freda is doing well today. The labs were reviewed in detail with her and were stable. Her ANC is much improved. She is ready to proceed with day 1, cycle 1 of the paclitaxel today. I encouraged her to use her Claritin daily to help with the sinus symptoms. We again discussed her home antiemetic schedule and she is comfortable with this. If cycle 1 goes well, we will likely remove the oral dexamethasone before the remaining cycles. We will also reduce the IV  dexamethasone from 45m to 479mif the first 3 doses are tolerated well.  She will return next week for labs, an office visit, and the start of cycle 2. She understands and agrees with this plan. She knows a goal of treatment in her case is cure. She has been encouraged to call with any issues that might arise before her next visit here.   FeMarcelino DusterNP  01/02/2014

## 2014-01-02 NOTE — Patient Instructions (Signed)
[facility] Discharge Instructions for Patients Receiving Chemotherapy  Today you received the following chemotherapy agents taxol.  To help prevent nausea and vomiting after your treatment, we encourage you to take your nausea medication zofran or compazine.   If you develop nausea and vomiting that is not controlled by your nausea medication, call the clinic.   BELOW ARE SYMPTOMS THAT SHOULD BE REPORTED IMMEDIATELY:  *FEVER GREATER THAN 100.5 F  *CHILLS WITH OR WITHOUT FEVER  NAUSEA AND VOMITING THAT IS NOT CONTROLLED WITH YOUR NAUSEA MEDICATION  *UNUSUAL SHORTNESS OF BREATH  *UNUSUAL BRUISING OR BLEEDING  TENDERNESS IN MOUTH AND THROAT WITH OR WITHOUT PRESENCE OF ULCERS  *URINARY PROBLEMS  *BOWEL PROBLEMS  UNUSUAL RASH Items with * indicate a potential emergency and should be followed up as soon as possible.  Feel free to call the clinic you have any questions or concerns. The clinic phone number is [phone].

## 2014-01-06 ENCOUNTER — Telehealth: Payer: Self-pay | Admitting: *Deleted

## 2014-01-06 NOTE — Telephone Encounter (Signed)
Called [name] [name] for chemotherapy F/U.  Patient is doing well.  "I am tired but I'm getting along better with this one than I did with the A/C."  Denies n/v but has anti-emetics on hand if needed.  Denies any new side effects or symptoms.  Bowel and bladder is functioning well.  Eating and drinking well and I instructed to drink 64 oz minimum daily or at least the day before, of and after treatment.  Denies questions at this time and encouraged to call if needed.  Reviewed how to call after hours in the case of an emergency.

## 2014-01-06 NOTE — Telephone Encounter (Signed)
Message copied by Cherylynn Ridges on Tue Jan 06, 2014  4:42 PM ------      Message from: Ignacia Felling      Created: Fri Jan 02, 2014  1:14 PM      Regarding: chemo follow up call      Contact: [phone]       1st Taxol   [phone]  Dr. Jana Hakim ------

## 2014-01-09 ENCOUNTER — Ambulatory Visit (HOSPITAL_BASED_OUTPATIENT_CLINIC_OR_DEPARTMENT_OTHER): Payer: BC Managed Care – PPO

## 2014-01-09 ENCOUNTER — Other Ambulatory Visit (HOSPITAL_BASED_OUTPATIENT_CLINIC_OR_DEPARTMENT_OTHER): Payer: BC Managed Care – PPO

## 2014-01-09 ENCOUNTER — Ambulatory Visit (HOSPITAL_BASED_OUTPATIENT_CLINIC_OR_DEPARTMENT_OTHER): Payer: BC Managed Care – PPO | Admitting: Adult Health

## 2014-01-09 ENCOUNTER — Ambulatory Visit: Payer: BC Managed Care – PPO

## 2014-01-09 ENCOUNTER — Encounter: Payer: Self-pay | Admitting: Adult Health

## 2014-01-09 ENCOUNTER — Telehealth: Payer: Self-pay | Admitting: Oncology

## 2014-01-09 VITALS — BP 130/70 | HR 81 | Temp 98.3°F | Resp 20 | Ht 66.0 in | Wt 150.2 lb

## 2014-01-09 DIAGNOSIS — C50212 Malignant neoplasm of upper-inner quadrant of left female breast: Secondary | ICD-10-CM

## 2014-01-09 DIAGNOSIS — Z5111 Encounter for antineoplastic chemotherapy: Secondary | ICD-10-CM

## 2014-01-09 DIAGNOSIS — Z170 Estrogen receptor positive status [ER+]: Secondary | ICD-10-CM

## 2014-01-09 DIAGNOSIS — C773 Secondary and unspecified malignant neoplasm of axilla and upper limb lymph nodes: Secondary | ICD-10-CM

## 2014-01-09 DIAGNOSIS — F411 Generalized anxiety disorder: Secondary | ICD-10-CM

## 2014-01-09 DIAGNOSIS — D6481 Anemia due to antineoplastic chemotherapy: Secondary | ICD-10-CM

## 2014-01-09 DIAGNOSIS — T451X5A Adverse effect of antineoplastic and immunosuppressive drugs, initial encounter: Secondary | ICD-10-CM

## 2014-01-09 DIAGNOSIS — Z95828 Presence of other vascular implants and grafts: Secondary | ICD-10-CM

## 2014-01-09 DIAGNOSIS — C50919 Malignant neoplasm of unspecified site of unspecified female breast: Secondary | ICD-10-CM

## 2014-01-09 LAB — CBC WITH DIFFERENTIAL/PLATELET
BASO%: 0.8 % (ref 0.0–2.0)
Basophils Absolute: 0 10*3/uL (ref 0.0–0.1)
EOS ABS: 0 10*3/uL (ref 0.0–0.5)
EOS%: 0.7 % (ref 0.0–7.0)
HCT: 30.4 % — ABNORMAL LOW (ref 34.8–46.6)
HEMOGLOBIN: 10.3 g/dL — AB (ref 11.6–15.9)
LYMPH%: 13.4 % — ABNORMAL LOW (ref 14.0–49.7)
MCH: 32.8 pg (ref 25.1–34.0)
MCHC: 33.8 g/dL (ref 31.5–36.0)
MCV: 97 fL (ref 79.5–101.0)
MONO#: 0.7 10*3/uL (ref 0.1–0.9)
MONO%: 12.3 % (ref 0.0–14.0)
NEUT%: 72.8 % (ref 38.4–76.8)
NEUTROS ABS: 4.2 10*3/uL (ref 1.5–6.5)
PLATELETS: 353 10*3/uL (ref 145–400)
RBC: 3.14 10*6/uL — ABNORMAL LOW (ref 3.70–5.45)
RDW: 16.4 % — AB (ref 11.2–14.5)
WBC: 5.8 10*3/uL (ref 3.9–10.3)
lymph#: 0.8 10*3/uL — ABNORMAL LOW (ref 0.9–3.3)

## 2014-01-09 LAB — COMPREHENSIVE METABOLIC PANEL (CC13)
ALK PHOS: 45 U/L (ref 40–150)
ALT: 15 U/L (ref 0–55)
AST: 14 U/L (ref 5–34)
Albumin: 3.6 g/dL (ref 3.5–5.0)
Anion Gap: 8 mEq/L (ref 3–11)
BUN: 11.9 mg/dL (ref 7.0–26.0)
CO2: 24 mEq/L (ref 22–29)
Calcium: 8.7 mg/dL (ref 8.4–10.4)
Chloride: 107 mEq/L (ref 98–109)
Creatinine: 0.8 mg/dL (ref 0.6–1.1)
Glucose: 93 mg/dl (ref 70–140)
Potassium: 3.7 mEq/L (ref 3.5–5.1)
Sodium: 139 mEq/L (ref 136–145)
Total Bilirubin: 0.45 mg/dL (ref 0.20–1.20)
Total Protein: 6.2 g/dL — ABNORMAL LOW (ref 6.4–8.3)

## 2014-01-09 MED ORDER — FAMOTIDINE IN NACL 20-0.9 MG/50ML-% IV SOLN
20.0000 mg | Freq: Once | INTRAVENOUS | Status: AC
  Administered 2014-01-09: 20 mg via INTRAVENOUS

## 2014-01-09 MED ORDER — DEXAMETHASONE SODIUM PHOSPHATE 20 MG/5ML IJ SOLN
INTRAMUSCULAR | Status: AC
Start: 2014-01-09 — End: 2014-01-09
  Filled 2014-01-09: qty 5

## 2014-01-09 MED ORDER — SODIUM CHLORIDE 0.9 % IJ SOLN
10.0000 mL | INTRAMUSCULAR | Status: DC | PRN
  Administered 2014-01-09: 10 mL
  Filled 2014-01-09: qty 10

## 2014-01-09 MED ORDER — SODIUM CHLORIDE 0.9 % IV SOLN
Freq: Once | INTRAVENOUS | Status: AC
  Administered 2014-01-09: 15:00:00 via INTRAVENOUS

## 2014-01-09 MED ORDER — DIPHENHYDRAMINE HCL 50 MG/ML IJ SOLN
INTRAMUSCULAR | Status: AC
  Filled 2014-01-09: qty 1

## 2014-01-09 MED ORDER — PACLITAXEL CHEMO INJECTION 300 MG/50ML
80.0000 mg/m2 | Freq: Once | INTRAVENOUS | Status: AC
  Administered 2014-01-09: 144 mg via INTRAVENOUS
  Filled 2014-01-09: qty 24

## 2014-01-09 MED ORDER — DIPHENHYDRAMINE HCL 50 MG/ML IJ SOLN
25.0000 mg | Freq: Once | INTRAMUSCULAR | Status: AC
Start: 2014-01-09 — End: 2014-01-09
  Administered 2014-01-09: 25 mg via INTRAVENOUS

## 2014-01-09 MED ORDER — SODIUM CHLORIDE 0.9 % IJ SOLN
10.0000 mL | INTRAMUSCULAR | Status: DC | PRN
  Administered 2014-01-09: 10 mL via INTRAVENOUS
  Filled 2014-01-09: qty 10

## 2014-01-09 MED ORDER — ONDANSETRON 8 MG/NS 50 ML IVPB
INTRAVENOUS | Status: AC
  Filled 2014-01-09: qty 8

## 2014-01-09 MED ORDER — DEXAMETHASONE SODIUM PHOSPHATE 20 MG/5ML IJ SOLN
20.0000 mg | Freq: Once | INTRAMUSCULAR | Status: AC
  Administered 2014-01-09: 20 mg via INTRAVENOUS

## 2014-01-09 MED ORDER — HEPARIN SOD (PORK) LOCK FLUSH 100 UNIT/ML IV SOLN
500.0000 [IU] | Freq: Once | INTRAVENOUS | Status: AC | PRN
  Administered 2014-01-09: 500 [IU]
  Filled 2014-01-09: qty 5

## 2014-01-09 MED ORDER — FAMOTIDINE IN NACL 20-0.9 MG/50ML-% IV SOLN
INTRAVENOUS | Status: AC
  Filled 2014-01-09: qty 50

## 2014-01-09 MED ORDER — ONDANSETRON 8 MG/50ML IVPB (CHCC)
8.0000 mg | Freq: Once | INTRAVENOUS | Status: AC
  Administered 2014-01-09: 8 mg via INTRAVENOUS

## 2014-01-09 NOTE — Patient Instructions (Signed)
[facility] Discharge Instructions for Patients Receiving Chemotherapy  Today you received the following chemotherapy agents: Taxol.  To help prevent nausea and vomiting after your treatment, we encourage you to take your nausea medication as prescribed.   If you develop nausea and vomiting that is not controlled by your nausea medication, call the clinic.   BELOW ARE SYMPTOMS THAT SHOULD BE REPORTED IMMEDIATELY:  *FEVER GREATER THAN 100.5 F  *CHILLS WITH OR WITHOUT FEVER  NAUSEA AND VOMITING THAT IS NOT CONTROLLED WITH YOUR NAUSEA MEDICATION  *UNUSUAL SHORTNESS OF BREATH  *UNUSUAL BRUISING OR BLEEDING  TENDERNESS IN MOUTH AND THROAT WITH OR WITHOUT PRESENCE OF ULCERS  *URINARY PROBLEMS  *BOWEL PROBLEMS  UNUSUAL RASH Items with * indicate a potential emergency and should be followed up as soon as possible.  Feel free to call the clinic you have any questions or concerns. The clinic phone number is [phone].    

## 2014-01-09 NOTE — Progress Notes (Signed)
[place]  [phone] [phone]     ID: [name] [name] OB: [birth year]  MR#: [id number]  YFV#:[id number]  PCP: [name]., MD GYN:  [name] [name] SU: Excell Seltzer OTHER MD: [name]  CHIEF COMPLAINT: Locally advanced breast cancer for chemotherapy f/u. CURRENT TREATMENT: adjuvant chemotherapy  BREAST CANCER HISTORY:  [name] [name] is 53 years old and she first noted a change, like a small hard pea in her left axilla. She brought this to Dr. [name] attention and bilateral diagnostic mammography and left ultrasonography at the breast Center for 07/18/2013 showed a small area of asymmetry in the inner left breast measuring perhaps 5 mm which was not palpable by physical exam. In the right breast there was some loosely grouped calcifications spanning up to 0.7 cm. Ultrasound of the left breast confirmed an ill-defined area of shadowing at the 9:00 position measuring up to 1.3 cm. There was also a 4 mm circumscribed hypoechoic mass at the 10:00 position. This is felt to be probably benign. In the left axilla there were 2 lymph nodes with slightly increased cortical thickening, although the fatty hila were maintained. These were not related to the patient's feeling of a "pea like mass" in her left axilla, which had by then pretty much resolved.   On 08/07/2013 the patient underwent left breast needle core biopsy and this showed ([id number]) and invasive lobular carcinoma (E-cadherin negative) estrogen receptor 70% positive with strong staining intensity, progesterone receptor 70% positive and strong staining intensity, with a proliferation marker of 7% and no HER-2 amplification, the signals ratio being 0.94 in the number per cell 1.50.  Bilateral breast MRI 08/15/2013 found an irregular spiculated mass in the left breast measuring approximately 1.5 cm. There was no abnormal enhancement of the pectoralis. There were at least 2 left axillary lymph nodes and showed  focal cortical thickening. There was a single left internal mammary lymph node at the level of the biopsy-proven malignancy which was not pathologic in appearance. The right breast and axilla were benign.  Genetics testing may 2015 was normal and did not reveal a mutation in the genes tested: ATM, BARD1, BRCA1, BRCA2, BRIP1, CDH1, CHEK2, MRE11A, MUTYH, NBN, NF1, PALB2, PTEN, RAD50, RAD51C, RAD51D, and TP53. She is status post left lumpectomy and axillary lymph node dissection by Dr Excell Seltzer 09/09/2013 for a pT1c pN2, stage IIIa invasive lobular carcinoma, grade 1, repeat HER-2 again negative. Patient had 8/23 LN positive and also evidence of LVI (lymphovascular invasion) and PNI (perineural invasion).  INTERVAL HISTORY: [name] [name] returns today alone, for follow up of her breast cancer. Today is week 2 of 12 planned cycles of weekly paclitaxel. She is doing well today. She tolerated her first treatment of weekly Paclitaxel very well.  She has had mild fatigue and has rested occasionally, but otherwise denies fevers, chills, nausea, vomiting, constipation, diarrhea, numbness/tingling, or any other concerns.     REVIEW OF SYSTEMS: A 10 point review of systems was conducted and is otherwise negative except for what is noted above.     PAST MEDICAL HISTORY: Past Medical History  Diagnosis Date  . Palpitations 2009    Improved; Secondary to premature ventricular contractions. No problems since-pt states was caring for ailing father, anxiety  . Anxiety disorder     Mild: treated by her gynecologist with selective serotonin reuptake inhibitor  . Nonspecific abnormal electrocardiogram (ECG) (EKG) 2009  . GERD (gastroesophageal reflux disease)     rolaids  . Hot flashes   . Malignant  neoplasm of breast (female), unspecified site   . Family history of malignant neoplasm of breast   . Arthritis     hands and feet  . Wears glasses     PAST SURGICAL HISTORY: Past Surgical History  Procedure Laterality  Date  . Thyroidectomy, partial    . Cesarean section    . Endometrial ablation    . Dilation and curettage of uterus    . Laparoscopic assisted vaginal hysterectomy  02/07/2011    Procedure: LAPAROSCOPIC ASSISTED VAGINAL HYSTERECTOMY;  Surgeon: [name] [name];  Location: [facility];  Service: Gynecology;  Laterality: N/A;  . Abdominal hysterectomy  02/07/2011    Procedure: HYSTERECTOMY ABDOMINAL;  Surgeon: [name] [name];  Location: [facility];  Service: Gynecology;  Laterality: N/A;  . Breast lumpectomy with needle localization and axillary lymph node dissection Left 09/09/2013    Procedure: LEFT BREAST LUMPECTOMY WITH NEEDLE LOCALIZATION AND LEFT AXILLARY LYMPH NODE DISSECTION;  Surgeon: [name], MD;  Location: [facility];  Service: General;  Laterality: Left;  . Portacath placement Right 09/29/2013    Procedure: INSERTION PORT-A-CATH;  Surgeon: [name], MD;  Location: [facility];  Service: General;  Laterality: Right;    FAMILY HISTORY Family History  Problem Relation Age of Onset  . Angina Father   . Aortic aneurysm Father   . Dementia Father   . Arrhythmia Maternal Aunt     A fib  . Breast cancer Maternal Aunt 56    currently 16  . Hypertension Other   . Arrhythmia Other     Uncle- a fib  . Arrhythmia Maternal Aunt     A fib  . Breast cancer Maternal Aunt     dx 61s; currently 72s  . Breast cancer Mother     dx 55s. Died at 63  . Colon cancer Maternal Grandmother     dx 54s; died at 9  . Colon cancer Maternal Grandfather     dx 51s; died in 36s  . Cancer Paternal Uncle     unknown primary in early 60s  . Breast cancer Maternal Aunt     dx 23s; died in 40s  The patient's father died from heart disease in the setting of dementia at the age of 19. The patient's mother died at the age of 8. She had been diagnosed with breast cancer at the age of 63. The patient is a single child. Her mother had 4 sisters, 3 of whom had breast  cancer diagnosed at the age of 48, 21, and 45. There is also history of colorectal cancer on the maternal side of the family. BRCA testing negative.  GYNECOLOGIC HISTORY:   menarche age 11, first live birth age 60, the patient is [place] P2. She underwent a hysterectomy without salpingo-oophorectomy in 2012. 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 Her husband [name] also works for [employer], running [employer].. Evorn Gong lives in [place] where he works as a Public relations account executive. Daughter Darrick Penna this in [place], where she works as an Engineering geologist in a [employer]. The patient has no grandchildren. She is a Information systems manager.   ADVANCED DIRECTIVES: Not in place   HEALTH MAINTENANCE: History  Substance Use Topics  . Smoking status: Never Smoker   . Smokeless tobacco: Not on file  . Alcohol Use: Yes     Comment: drinks  1-2 beers a week     Colonoscopy:2010   PAP:2012, s/p hysterectomy.  Bone density:2012   Lipid panel: 11/05/2013 TC 219, HDL 41, LDL 137, TG 204  No Known Allergies  Current Outpatient Prescriptions  Medication Sig Dispense Refill  . calcium carbonate-magnesium hydroxide (ROLAIDS) 334 MG CHEW Chew 1 tablet by mouth 2 (two) times daily as needed. For indigestion       . Cholecalciferol (VITAMIN D) 2000 UNITS tablet Take 2,000 Units by mouth daily.      Marland Kitchen dexamethasone (DECADRON) 4 MG tablet Take 4 mg by mouth 2 (two) times daily with a meal.      . gabapentin (NEURONTIN) 300 MG capsule Take 1 capsule (300 mg total) by mouth at bedtime.  30 capsule  6  . LORazepam (ATIVAN) 0.5 MG tablet Take 1 tablet (0.5 mg total) by mouth at bedtime.  30 tablet  0  . NON FORMULARY       . prochlorperazine (COMPAZINE) 10 MG tablet Take 10 mg by mouth every 6 (six) hours as needed for nausea or vomiting.      . ciprofloxacin (CIPRO) 500 MG tablet Take 1 tablet (500 mg  total) by mouth 2 (two) times daily.  14 tablet  0  . diclofenac (VOLTAREN) 75 MG EC tablet Take 75 mg by mouth as needed.      . loratadine (CLARITIN) 10 MG tablet Take 10 mg by mouth daily.      Marland Kitchen nystatin-triamcinolone (MYCOLOG II) cream Apply 1 application topically 2 (two) times daily.  30 g  0  . oxyCODONE (OXY IR/ROXICODONE) 5 MG immediate release tablet Take 1-2 tablets (5-10 mg total) by mouth every 6 (six) hours as needed.  30 tablet  0   No current facility-administered medications for this visit.   Facility-Administered Medications Ordered in Other Visits  Medication Dose Route Frequency Provider Last Rate Last Dose  . sodium chloride 0.9 % injection 10 mL  10 mL Intravenous PRN Chauncey Cruel, MD   10 mL at 01/09/14 1333    OBJECTIVE: Filed Vitals:   01/09/14 1353  BP: 130/70  Pulse: 81  Temp: 98.3 F (36.8 C)  Resp: 20     Body mass index is 24.25 kg/(m^2).     GENERAL: Patient is a well appearing female in no acute distress HEENT:  Sclerae anicteric.  Oropharynx clear and moist. No ulcerations or evidence of oropharyngeal candidiasis. Neck is supple.  NODES:  No cervical, supraclavicular, or axillary lymphadenopathy palpated.  BREAST EXAM:  Deferred. LUNGS:  Clear to auscultation bilaterally.  No wheezes or rhonchi. HEART:  Regular rate and rhythm. No murmur appreciated. ABDOMEN:  Soft, nontender.  Positive, normoactive bowel sounds. No organomegaly palpated. MSK:  No focal spinal tenderness to palpation. Full range of motion bilaterally in the upper extremities. EXTREMITIES:  No peripheral edema.   SKIN:  Clear with no obvious rashes or skin changes. No nail dyscrasia. NEURO:  Nonfocal. Well oriented.  Appropriate affect. ECOG FS:1 - Symptomatic but completely ambulatory   LAB RESULTS:  CMP     Component Value Date/Time   NA 139 01/09/2014 1329   K 3.7 01/09/2014 1329   CO2 24 01/09/2014 1329   GLUCOSE 93 01/09/2014 1329   BUN 11.9 01/09/2014 1329    CREATININE 0.8 01/09/2014 1329   CALCIUM 8.7 01/09/2014 1329   PROT 6.2* 01/09/2014 1329   ALBUMIN 3.6 01/09/2014 1329   AST 14 01/09/2014 1329   ALT 15 01/09/2014 1329  ALKPHOS 45 01/09/2014 1329   BILITOT 0.45 01/09/2014 1329    I No results found for this basename: SPEP,  UPEP,   kappa and lambda light chains    Lab Results  Component Value Date   WBC 5.8 01/09/2014   NEUTROABS 4.2 01/09/2014   HGB 10.3* 01/09/2014   HCT 30.4* 01/09/2014   MCV 97.0 01/09/2014   PLT 353 01/09/2014      Chemistry      Component Value Date/Time   NA 139 01/09/2014 1329   K 3.7 01/09/2014 1329   CO2 24 01/09/2014 1329   BUN 11.9 01/09/2014 1329   CREATININE 0.8 01/09/2014 1329      Component Value Date/Time   CALCIUM 8.7 01/09/2014 1329   ALKPHOS 45 01/09/2014 1329   AST 14 01/09/2014 1329   ALT 15 01/09/2014 1329   BILITOT 0.45 01/09/2014 1329      STUDIES: No results found.   ASSESSMENT: 53 y.o. BRCA negative [place] woman status post left breast biopsy for 01/18/2014 showing invasive lobular carcinoma (E-cadherin negative), estrogen receptor 70% positive, progesterone receptor 70% positive, with an MIB-1 of 7%, and no HER-2 amplification  (1) ultrasound-guided biopsy of a suspicious lymph node in the left axilla 08/19/2012 was positive  (2) status post left lumpectomy and axillary lymph node dissection 09/09/2013 for a pT1c pN2, stage IIIa invasive lobular carcinoma, grade 1, repeat HER-2 again negative. [She had 8 of 23 axillary lymph nodes involved]  (4) Adjuvant chemotherapy consisting of doxorubicin and cyclophosphamide in dose dense fashion x4, with Neulasta support, completed 12/18/2013; to be followed by paclitaxel weekly x 12  (5) radiation to follow chemotherapy  (6) antiestrogen therapy for 10 years to follow radiation   PLAN: Marlei is doing well today.  She tolerated her first week of Paclitaxel chemotherapy very well.  She only had mild fatigue aftwerwards.  I reviewed her CBC  with her in detail and she does continue to have a mild treatment related anemia that we will continue to monitor.  She is planning on weaning herself down on the oral Dexamethasone that she takes following treatment.  I suggested she decrease from two 85m tablets, to one BID, then one tablet daily, then nothing if tolerated.    Texie will return next week for labs, evaluation and week three of weekly Paclitaxel chemotherapy. She understands and agrees with this plan. She knows a goal of treatment in her case is cure. She has been encouraged to call with any issues that might arise before her next visit here.   I spent 25 minutes counseling the patient face to face.  The total time spent in the appointment was 30 minutes.   LMinette Headland NLe [phone]/03/2014

## 2014-01-09 NOTE — Patient Instructions (Signed)

## 2014-01-09 NOTE — Telephone Encounter (Signed)
returned pt call and confirmed flushes added with all appts....pt ok adn aware

## 2014-01-14 ENCOUNTER — Encounter: Payer: Self-pay | Admitting: *Deleted

## 2014-01-14 ENCOUNTER — Telehealth: Payer: Self-pay | Admitting: Oncology

## 2014-01-14 ENCOUNTER — Telehealth: Payer: Self-pay | Admitting: Nurse Practitioner

## 2014-01-14 NOTE — Telephone Encounter (Signed)
S/w pt advised appt chg on 9/25 from  [facility] to [facility] due to HF out of office. Gave new appt time of 11am. Pt verbalized understanding.

## 2014-01-14 NOTE — Telephone Encounter (Signed)
per pof to sch pt appt on 11/6-sent MW email to coordinate trmt w/MD app

## 2014-01-15 ENCOUNTER — Telehealth: Payer: Self-pay | Admitting: *Deleted

## 2014-01-15 NOTE — Telephone Encounter (Signed)
Per staff message from scheduler I have adjusted appt on 11/6

## 2014-01-16 ENCOUNTER — Ambulatory Visit (HOSPITAL_BASED_OUTPATIENT_CLINIC_OR_DEPARTMENT_OTHER): Payer: BC Managed Care – PPO | Admitting: Nurse Practitioner

## 2014-01-16 ENCOUNTER — Other Ambulatory Visit (HOSPITAL_BASED_OUTPATIENT_CLINIC_OR_DEPARTMENT_OTHER): Payer: BC Managed Care – PPO

## 2014-01-16 ENCOUNTER — Encounter: Payer: Self-pay | Admitting: Nurse Practitioner

## 2014-01-16 ENCOUNTER — Ambulatory Visit (HOSPITAL_BASED_OUTPATIENT_CLINIC_OR_DEPARTMENT_OTHER): Payer: BC Managed Care – PPO

## 2014-01-16 ENCOUNTER — Ambulatory Visit: Payer: BC Managed Care – PPO

## 2014-01-16 ENCOUNTER — Other Ambulatory Visit: Payer: Self-pay | Admitting: Hematology and Oncology

## 2014-01-16 VITALS — BP 120/68 | HR 93 | Temp 98.4°F | Resp 18 | Ht 66.0 in | Wt 150.2 lb

## 2014-01-16 DIAGNOSIS — Z5111 Encounter for antineoplastic chemotherapy: Secondary | ICD-10-CM

## 2014-01-16 DIAGNOSIS — Z170 Estrogen receptor positive status [ER+]: Secondary | ICD-10-CM

## 2014-01-16 DIAGNOSIS — F411 Generalized anxiety disorder: Secondary | ICD-10-CM

## 2014-01-16 DIAGNOSIS — Z95828 Presence of other vascular implants and grafts: Secondary | ICD-10-CM

## 2014-01-16 DIAGNOSIS — R5383 Other fatigue: Secondary | ICD-10-CM | POA: Insufficient documentation

## 2014-01-16 DIAGNOSIS — C50919 Malignant neoplasm of unspecified site of unspecified female breast: Secondary | ICD-10-CM

## 2014-01-16 DIAGNOSIS — C773 Secondary and unspecified malignant neoplasm of axilla and upper limb lymph nodes: Secondary | ICD-10-CM

## 2014-01-16 DIAGNOSIS — R5381 Other malaise: Secondary | ICD-10-CM

## 2014-01-16 DIAGNOSIS — C50212 Malignant neoplasm of upper-inner quadrant of left female breast: Secondary | ICD-10-CM

## 2014-01-16 LAB — CBC WITH DIFFERENTIAL/PLATELET
BASO%: 1.3 % (ref 0.0–2.0)
Basophils Absolute: 0.1 10*3/uL (ref 0.0–0.1)
EOS ABS: 0 10*3/uL (ref 0.0–0.5)
EOS%: 0.6 % (ref 0.0–7.0)
HCT: 29.8 % — ABNORMAL LOW (ref 34.8–46.6)
HGB: 10.3 g/dL — ABNORMAL LOW (ref 11.6–15.9)
LYMPH%: 13.1 % — AB (ref 14.0–49.7)
MCH: 33.6 pg (ref 25.1–34.0)
MCHC: 34.5 g/dL (ref 31.5–36.0)
MCV: 97.4 fL (ref 79.5–101.0)
MONO#: 0.6 10*3/uL (ref 0.1–0.9)
MONO%: 11.4 % (ref 0.0–14.0)
NEUT%: 73.6 % (ref 38.4–76.8)
NEUTROS ABS: 3.7 10*3/uL (ref 1.5–6.5)
Platelets: 288 10*3/uL (ref 145–400)
RBC: 3.06 10*6/uL — AB (ref 3.70–5.45)
RDW: 16.8 % — AB (ref 11.2–14.5)
WBC: 5 10*3/uL (ref 3.9–10.3)
lymph#: 0.7 10*3/uL — ABNORMAL LOW (ref 0.9–3.3)

## 2014-01-16 LAB — COMPREHENSIVE METABOLIC PANEL (CC13)
ALBUMIN: 3.5 g/dL (ref 3.5–5.0)
ALK PHOS: 47 U/L (ref 40–150)
ALT: 21 U/L (ref 0–55)
AST: 18 U/L (ref 5–34)
Anion Gap: 10 mEq/L (ref 3–11)
BILIRUBIN TOTAL: 0.53 mg/dL (ref 0.20–1.20)
BUN: 12.4 mg/dL (ref 7.0–26.0)
CO2: 25 mEq/L (ref 22–29)
Calcium: 8.6 mg/dL (ref 8.4–10.4)
Chloride: 106 mEq/L (ref 98–109)
Creatinine: 1 mg/dL (ref 0.6–1.1)
GLUCOSE: 119 mg/dL (ref 70–140)
POTASSIUM: 3.6 meq/L (ref 3.5–5.1)
SODIUM: 140 meq/L (ref 136–145)
TOTAL PROTEIN: 6.5 g/dL (ref 6.4–8.3)

## 2014-01-16 MED ORDER — SODIUM CHLORIDE 0.9 % IJ SOLN
10.0000 mL | INTRAMUSCULAR | Status: DC | PRN
  Administered 2014-01-16: 10 mL
  Filled 2014-01-16: qty 10

## 2014-01-16 MED ORDER — SODIUM CHLORIDE 0.9 % IJ SOLN
10.0000 mL | INTRAMUSCULAR | Status: DC | PRN
  Administered 2014-01-16: 10 mL via INTRAVENOUS
  Filled 2014-01-16: qty 10

## 2014-01-16 MED ORDER — ONDANSETRON 8 MG/NS 50 ML IVPB
INTRAVENOUS | Status: AC
  Filled 2014-01-16: qty 8

## 2014-01-16 MED ORDER — PACLITAXEL CHEMO INJECTION 300 MG/50ML
80.0000 mg/m2 | Freq: Once | INTRAVENOUS | Status: AC
  Administered 2014-01-16: 144 mg via INTRAVENOUS
  Filled 2014-01-16: qty 24

## 2014-01-16 MED ORDER — HEPARIN SOD (PORK) LOCK FLUSH 100 UNIT/ML IV SOLN
500.0000 [IU] | Freq: Once | INTRAVENOUS | Status: AC | PRN
  Administered 2014-01-16: 500 [IU]
  Filled 2014-01-16: qty 5

## 2014-01-16 MED ORDER — DIPHENHYDRAMINE HCL 50 MG/ML IJ SOLN
25.0000 mg | Freq: Once | INTRAMUSCULAR | Status: AC
  Administered 2014-01-16: 25 mg via INTRAVENOUS

## 2014-01-16 MED ORDER — FAMOTIDINE IN NACL 20-0.9 MG/50ML-% IV SOLN
20.0000 mg | Freq: Once | INTRAVENOUS | Status: AC
  Administered 2014-01-16: 20 mg via INTRAVENOUS

## 2014-01-16 MED ORDER — DIPHENHYDRAMINE HCL 50 MG/ML IJ SOLN
INTRAMUSCULAR | Status: AC
Start: 2014-01-16 — End: 2014-01-16
  Filled 2014-01-16: qty 1

## 2014-01-16 MED ORDER — SODIUM CHLORIDE 0.9 % IV SOLN
Freq: Once | INTRAVENOUS | Status: AC
  Administered 2014-01-16: 15:00:00 via INTRAVENOUS

## 2014-01-16 MED ORDER — DEXAMETHASONE SODIUM PHOSPHATE 10 MG/ML IJ SOLN
10.0000 mg | Freq: Once | INTRAMUSCULAR | Status: AC
  Administered 2014-01-16: 10 mg via INTRAVENOUS

## 2014-01-16 MED ORDER — ONDANSETRON 8 MG/50ML IVPB (CHCC)
8.0000 mg | Freq: Once | INTRAVENOUS | Status: AC
  Administered 2014-01-16: 8 mg via INTRAVENOUS

## 2014-01-16 MED ORDER — DEXAMETHASONE SODIUM PHOSPHATE 10 MG/ML IJ SOLN
INTRAMUSCULAR | Status: AC
  Filled 2014-01-16: qty 1

## 2014-01-16 MED ORDER — FAMOTIDINE IN NACL 20-0.9 MG/50ML-% IV SOLN
INTRAVENOUS | Status: AC
  Filled 2014-01-16: qty 50

## 2014-01-16 NOTE — Progress Notes (Signed)
Discharged at 1718 alone, ambulatory in no distress

## 2014-01-16 NOTE — Patient Instructions (Signed)
[facility] Discharge Instructions for Patients Receiving Chemotherapy  Today you received the following chemotherapy agents: Taxol.  To help prevent nausea and vomiting after your treatment, we encourage you to take your nausea medication as prescribed.   If you develop nausea and vomiting that is not controlled by your nausea medication, call the clinic.   BELOW ARE SYMPTOMS THAT SHOULD BE REPORTED IMMEDIATELY:  *FEVER GREATER THAN 100.5 F  *CHILLS WITH OR WITHOUT FEVER  NAUSEA AND VOMITING THAT IS NOT CONTROLLED WITH YOUR NAUSEA MEDICATION  *UNUSUAL SHORTNESS OF BREATH  *UNUSUAL BRUISING OR BLEEDING  TENDERNESS IN MOUTH AND THROAT WITH OR WITHOUT PRESENCE OF ULCERS  *URINARY PROBLEMS  *BOWEL PROBLEMS  UNUSUAL RASH Items with * indicate a potential emergency and should be followed up as soon as possible.  Feel free to call the clinic you have any questions or concerns. The clinic phone number is [phone].    

## 2014-01-16 NOTE — Progress Notes (Signed)
[place]  Telephone:(336) [phone] Fax:(336) [phone]     ID: [name] [name] OB: [birth year]  MR#: [id number]  HYI#:[id number]  PCP: [name]., MD GYN:  [name] SU: Excell Seltzer OTHER MD: [name]  CHIEF COMPLAINT: Locally advanced breast cancer for chemotherapy f/u. CURRENT TREATMENT: adjuvant chemotherapy  BREAST CANCER HISTORY:  [name] [name] is 53 years old and she first noted a change, like a small hard pea in her left axilla. She brought this to Dr. [name] attention and bilateral diagnostic mammography and left ultrasonography at the breast Center for 07/18/2013 showed a small area of asymmetry in the inner left breast measuring perhaps 5 mm which was not palpable by physical exam. In the right breast there was some loosely grouped calcifications spanning up to 0.7 cm. Ultrasound of the left breast confirmed an ill-defined area of shadowing at the 9:00 position measuring up to 1.3 cm. There was also a 4 mm circumscribed hypoechoic mass at the 10:00 position. This is felt to be probably benign. In the left axilla there were 2 lymph nodes with slightly increased cortical thickening, although the fatty hila were maintained. These were not related to the patient's feeling of a "pea like mass" in her left axilla, which had by then pretty much resolved.   On 08/07/2013 the patient underwent left breast needle core biopsy and this showed ([id number]) and invasive lobular carcinoma (E-cadherin negative) estrogen receptor 70% positive with strong staining intensity, progesterone receptor 70% positive and strong staining intensity, with a proliferation marker of 7% and no HER-2 amplification, the signals ratio being 0.94 in the number per cell 1.50.  Bilateral breast MRI 08/15/2013 found an irregular spiculated mass in the left breast measuring approximately 1.5 cm. There was no abnormal enhancement of the pectoralis. There were at least 2 left axillary lymph nodes and showed  focal cortical thickening. There was a single left internal mammary lymph node at the level of the biopsy-proven malignancy which was not pathologic in appearance. The right breast and axilla were benign.  Genetics testing may 2015 was normal and did not reveal a mutation in the genes tested: ATM, BARD1, BRCA1, BRCA2, BRIP1, CDH1, CHEK2, MRE11A, MUTYH, NBN, NF1, PALB2, PTEN, RAD50, RAD51C, RAD51D, and TP53. She is status post left lumpectomy and axillary lymph node dissection by Dr Excell Seltzer 09/09/2013 for a pT1c pN2, stage IIIa invasive lobular carcinoma, grade 1, repeat HER-2 again negative. Patient had 8/23 LN positive and also evidence of LVI (lymphovascular invasion) and PNI (perineural invasion).  INTERVAL HISTORY: [name] [name] returns today alone, for follow up of her breast cancer. Today is week 3 of 12 planned cycles of weekly paclitaxel. She is tolerating her chemo very well with few complaints. She continues to experience mild fatigues, but denies fevers, chills, nausea, vomiting, or changes in bowel or bladder habits. She has no peripheral neuropathy symptoms, rashes, or mouth sores.  REVIEW OF SYSTEMS: A 10 point review of systems was conducted and is otherwise negative except for what is noted above.     PAST MEDICAL HISTORY: Past Medical History  Diagnosis Date  . Palpitations 2009    Improved; Secondary to premature ventricular contractions. No problems since-pt states was caring for ailing father, anxiety  . Anxiety disorder     Mild: treated by her gynecologist with selective serotonin reuptake inhibitor  . Nonspecific abnormal electrocardiogram (ECG) (EKG) 2009  . GERD (gastroesophageal reflux disease)     rolaids  . Hot flashes   . Malignant neoplasm of breast (  female), unspecified site   . Family history of malignant neoplasm of breast   . Arthritis     hands and feet  . Wears glasses     PAST SURGICAL HISTORY: Past Surgical History  Procedure Laterality Date  .  Thyroidectomy, partial    . Cesarean section    . Endometrial ablation    . Dilation and curettage of uterus    . Laparoscopic assisted vaginal hysterectomy  02/07/2011    Procedure: LAPAROSCOPIC ASSISTED VAGINAL HYSTERECTOMY;  Surgeon: Gretchen Adkins;  Location: [facility];  Service: Gynecology;  Laterality: N/A;  . Abdominal hysterectomy  02/07/2011    Procedure: HYSTERECTOMY ABDOMINAL;  Surgeon: Gretchen Adkins;  Location: [facility];  Service: Gynecology;  Laterality: N/A;  . Breast lumpectomy with needle localization and axillary lymph node dissection Left 09/09/2013    Procedure: LEFT BREAST LUMPECTOMY WITH NEEDLE LOCALIZATION AND LEFT AXILLARY LYMPH NODE DISSECTION;  Surgeon: Benjamin T Hoxworth, MD;  Location: [facility];  Service: General;  Laterality: Left;  . Portacath placement Right 09/29/2013    Procedure: INSERTION PORT-A-CATH;  Surgeon: Benjamin T Hoxworth, MD;  Location: [facility];  Service: General;  Laterality: Right;    FAMILY HISTORY Family History  Problem Relation Age of Onset  . Angina Father   . Aortic aneurysm Father   . Dementia Father   . Arrhythmia Maternal Aunt     A fib  . Breast cancer Maternal Aunt 56    currently 73  . Hypertension Other   . Arrhythmia Other     Uncle- a fib  . Arrhythmia Maternal Aunt     A fib  . Breast cancer Maternal Aunt     dx 70s; currently 80s  . Breast cancer Mother     dx 70s. Died at 93  . Colon cancer Maternal Grandmother     dx 70s; died at 90  . Colon cancer Maternal Grandfather     dx 70s; died in 70s  . Cancer Paternal Uncle     unknown primary in early 60s  . Breast cancer Maternal Aunt     dx 50s; died in 60s  The patient's father died from heart disease in the setting of dementia at the age of 82. The patient's mother died at the age of 93. She had been diagnosed with breast cancer at the age of 82. The patient is a single child. Her mother had 4 sisters, 3 of whom had breast cancer  diagnosed at the age of 56, 70, and 50. There is also history of colorectal cancer on the maternal side of the family. BRCA testing negative.  GYNECOLOGIC HISTORY:   menarche age 11, first live birth age 22, the patient is [place] P2. She underwent a hysterectomy without salpingo-oophorectomy in 2012. 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 Her husband David also works for the county, running their printing.. Son Brian lives in [place] where he works as a bank employee. Daughter Shelly this in [place], where she works as an aesthetician in a medical spa. The patient has no grandchildren. She is a protestant.   ADVANCED DIRECTIVES: Not in place   HEALTH MAINTENANCE: History  Substance Use Topics  . Smoking status: Never Smoker   . Smokeless tobacco: Not on file  . Alcohol Use: Yes     Comment: drinks 1-2 beers a   week     Colonoscopy:2010   PAP:2012, s/p hysterectomy.  Bone density:2012   Lipid panel: 11/05/2013 TC 219, HDL 41, LDL 137, TG 204  No Known Allergies  Current Outpatient Prescriptions  Medication Sig Dispense Refill  . calcium carbonate-magnesium hydroxide (ROLAIDS) 334 MG CHEW Chew 1 tablet by mouth 2 (two) times daily as needed. For indigestion       . Cholecalciferol (VITAMIN D) 2000 UNITS tablet Take 2,000 Units by mouth daily.      Marland Kitchen LORazepam (ATIVAN) 0.5 MG tablet Take 1 tablet (0.5 mg total) by mouth at bedtime.  30 tablet  0  . NON FORMULARY       . dexamethasone (DECADRON) 4 MG tablet Take 4 mg by mouth 2 (two) times daily with a meal.      . diclofenac (VOLTAREN) 75 MG EC tablet Take 75 mg by mouth as needed.      . gabapentin (NEURONTIN) 300 MG capsule Take 1 capsule (300 mg total) by mouth at bedtime.  30 capsule  6  . loratadine (CLARITIN) 10 MG tablet Take 10 mg by mouth daily.      Marland Kitchen nystatin-triamcinolone (MYCOLOG II) cream  Apply 1 application topically 2 (two) times daily.  30 g  0  . oxyCODONE (OXY IR/ROXICODONE) 5 MG immediate release tablet Take 1-2 tablets (5-10 mg total) by mouth every 6 (six) hours as needed.  30 tablet  0  . prochlorperazine (COMPAZINE) 10 MG tablet Take 10 mg by mouth every 6 (six) hours as needed for nausea or vomiting.       No current facility-administered medications for this visit.    OBJECTIVE: Filed Vitals:   01/16/14 1357  BP: 120/68  Pulse: 93  Temp: 98.4 F (36.9 C)  Resp: 18     Body mass index is 24.25 kg/(m^2).      GENERAL: Patient is a well appearing female in no acute distress Skin: warm, dry  HEENT: sclerae anicteric, conjunctivae pink, oropharynx clear. No thrush or mucositis.  Lymph Nodes: No cervical or supraclavicular lymphadenopathy  Lungs: clear to auscultation bilaterally, no rales, wheezes, or rhonci  Heart: regular rate and rhythm  Abdomen: round, soft, non tender, positive bowel sounds  Musculoskeletal: No focal spinal tenderness, no peripheral edema  Neuro: non focal, well oriented, positive affect  Breasts: deferred  ECOG FS:1 - Symptomatic but completely ambulatory   LAB RESULTS:  CMP     Component Value Date/Time   NA 140 01/16/2014 1322   K 3.6 01/16/2014 1322   CO2 25 01/16/2014 1322   GLUCOSE 119 01/16/2014 1322   BUN 12.4 01/16/2014 1322   CREATININE 1.0 01/16/2014 1322   CALCIUM 8.6 01/16/2014 1322   PROT 6.5 01/16/2014 1322   ALBUMIN 3.5 01/16/2014 1322   AST 18 01/16/2014 1322   ALT 21 01/16/2014 1322   ALKPHOS 47 01/16/2014 1322   BILITOT 0.53 01/16/2014 1322    I No results found for this basename: SPEP,  UPEP,   kappa and lambda light chains    Lab Results  Component Value Date   WBC 5.0 01/16/2014   NEUTROABS 3.7 01/16/2014   HGB 10.3* 01/16/2014   HCT 29.8* 01/16/2014   MCV 97.4 01/16/2014   PLT 288 01/16/2014      Chemistry      Component Value Date/Time   NA 140 01/16/2014 1322   K 3.6 01/16/2014 1322   CO2 25  01/16/2014 1322   BUN 12.4 01/16/2014 1322  CREATININE 1.0 01/16/2014 1322      Component Value Date/Time   CALCIUM 8.6 01/16/2014 1322   ALKPHOS 47 01/16/2014 1322   AST 18 01/16/2014 1322   ALT 21 01/16/2014 1322   BILITOT 0.53 01/16/2014 1322      STUDIES: No results found.   ASSESSMENT: 53 y.o. BRCA negative [place] woman status post left breast biopsy for 01/18/2014 showing invasive lobular carcinoma (E-cadherin negative), estrogen receptor 70% positive, progesterone receptor 70% positive, with an MIB-1 of 7%, and no HER-2 amplification  (1) ultrasound-guided biopsy of a suspicious lymph node in the left axilla 08/19/2012 was positive  (2) status post left lumpectomy and axillary lymph node dissection 09/09/2013 for a pT1c pN2, stage IIIa invasive lobular carcinoma, grade 1, repeat HER-2 again negative. [She had 8 of 23 axillary lymph nodes involved]  (4) Adjuvant chemotherapy consisting of doxorubicin and cyclophosphamide in dose dense fashion x4, with Neulasta support, completed 12/18/2013; to be followed by paclitaxel weekly x 12  (5) radiation to follow chemotherapy  (6) antiestrogen therapy for 10 years to follow radiation   PLAN: [name] [name] continues to tolerate her chemo well. The labs were reviewed in detail with her and were stable. She had no nausea, even after stopping the oral dexamethasone. I cut the dose of IV dexamethasone given during treatment down from 74m to 139mas she still had some blurred vision up to 24 hrs afterwards. We will proceed with day 1, cycle 3 of the paclitaxel today.   [name] [name] will return next Friday for labs, an office visit, and week 4 of treatment. She understands and agrees with this plan. She knows a goal of treatment in her case is cure. She has been encouraged to call with any issues that might arise before her next visit here.   FeMarcelino DusterNPSouth Van [phone]/18/2015

## 2014-01-16 NOTE — Patient Instructions (Signed)

## 2014-01-19 ENCOUNTER — Telehealth: Payer: Self-pay | Admitting: Nurse Practitioner

## 2014-01-20 ENCOUNTER — Telehealth: Payer: Self-pay | Admitting: *Deleted

## 2014-01-20 NOTE — Telephone Encounter (Signed)
I have adjusted all chemo appts per staff message. ADvised scheduler./

## 2014-01-23 ENCOUNTER — Ambulatory Visit (HOSPITAL_BASED_OUTPATIENT_CLINIC_OR_DEPARTMENT_OTHER): Payer: BC Managed Care – PPO

## 2014-01-23 ENCOUNTER — Ambulatory Visit (HOSPITAL_BASED_OUTPATIENT_CLINIC_OR_DEPARTMENT_OTHER): Payer: BC Managed Care – PPO | Admitting: Oncology

## 2014-01-23 ENCOUNTER — Other Ambulatory Visit: Payer: Self-pay | Admitting: Oncology

## 2014-01-23 ENCOUNTER — Other Ambulatory Visit (HOSPITAL_BASED_OUTPATIENT_CLINIC_OR_DEPARTMENT_OTHER): Payer: BC Managed Care – PPO

## 2014-01-23 ENCOUNTER — Ambulatory Visit: Payer: BC Managed Care – PPO

## 2014-01-23 ENCOUNTER — Telehealth: Payer: Self-pay | Admitting: Oncology

## 2014-01-23 ENCOUNTER — Encounter: Payer: Self-pay | Admitting: Oncology

## 2014-01-23 VITALS — BP 128/73 | HR 87 | Temp 98.3°F | Resp 18 | Ht 66.0 in | Wt 152.3 lb

## 2014-01-23 VITALS — BP 129/85 | HR 87 | Temp 98.6°F | Resp 18

## 2014-01-23 DIAGNOSIS — Z5111 Encounter for antineoplastic chemotherapy: Secondary | ICD-10-CM

## 2014-01-23 DIAGNOSIS — C773 Secondary and unspecified malignant neoplasm of axilla and upper limb lymph nodes: Secondary | ICD-10-CM

## 2014-01-23 DIAGNOSIS — C50212 Malignant neoplasm of upper-inner quadrant of left female breast: Secondary | ICD-10-CM

## 2014-01-23 DIAGNOSIS — C50919 Malignant neoplasm of unspecified site of unspecified female breast: Secondary | ICD-10-CM

## 2014-01-23 DIAGNOSIS — F411 Generalized anxiety disorder: Secondary | ICD-10-CM

## 2014-01-23 DIAGNOSIS — Z170 Estrogen receptor positive status [ER+]: Secondary | ICD-10-CM

## 2014-01-23 LAB — CBC WITH DIFFERENTIAL/PLATELET
BASO%: 1.5 % (ref 0.0–2.0)
Basophils Absolute: 0.1 10*3/uL (ref 0.0–0.1)
EOS ABS: 0.1 10*3/uL (ref 0.0–0.5)
EOS%: 2.4 % (ref 0.0–7.0)
HCT: 29.8 % — ABNORMAL LOW (ref 34.8–46.6)
HGB: 10.1 g/dL — ABNORMAL LOW (ref 11.6–15.9)
LYMPH#: 0.6 10*3/uL — AB (ref 0.9–3.3)
LYMPH%: 12.6 % — ABNORMAL LOW (ref 14.0–49.7)
MCH: 33.1 pg (ref 25.1–34.0)
MCHC: 33.9 g/dL (ref 31.5–36.0)
MCV: 97.8 fL (ref 79.5–101.0)
MONO#: 0.6 10*3/uL (ref 0.1–0.9)
MONO%: 12.6 % (ref 0.0–14.0)
NEUT%: 70.9 % (ref 38.4–76.8)
NEUTROS ABS: 3.2 10*3/uL (ref 1.5–6.5)
Platelets: 284 10*3/uL (ref 145–400)
RBC: 3.05 10*6/uL — ABNORMAL LOW (ref 3.70–5.45)
RDW: 16.4 % — AB (ref 11.2–14.5)
WBC: 4.5 10*3/uL (ref 3.9–10.3)

## 2014-01-23 LAB — COMPREHENSIVE METABOLIC PANEL (CC13)
ALT: 25 U/L (ref 0–55)
AST: 19 U/L (ref 5–34)
Albumin: 3.6 g/dL (ref 3.5–5.0)
Alkaline Phosphatase: 52 U/L (ref 40–150)
Anion Gap: 8 mEq/L (ref 3–11)
BUN: 12.8 mg/dL (ref 7.0–26.0)
CALCIUM: 9 mg/dL (ref 8.4–10.4)
CO2: 24 meq/L (ref 22–29)
Chloride: 107 mEq/L (ref 98–109)
Creatinine: 0.7 mg/dL (ref 0.6–1.1)
GLUCOSE: 93 mg/dL (ref 70–140)
POTASSIUM: 3.7 meq/L (ref 3.5–5.1)
SODIUM: 140 meq/L (ref 136–145)
Total Bilirubin: 0.68 mg/dL (ref 0.20–1.20)
Total Protein: 6.5 g/dL (ref 6.4–8.3)

## 2014-01-23 MED ORDER — FAMOTIDINE IN NACL 20-0.9 MG/50ML-% IV SOLN
20.0000 mg | Freq: Once | INTRAVENOUS | Status: AC
  Administered 2014-01-23: 20 mg via INTRAVENOUS

## 2014-01-23 MED ORDER — HEPARIN SOD (PORK) LOCK FLUSH 100 UNIT/ML IV SOLN
500.0000 [IU] | Freq: Once | INTRAVENOUS | Status: AC | PRN
  Administered 2014-01-23: 500 [IU]
  Filled 2014-01-23: qty 5

## 2014-01-23 MED ORDER — DEXAMETHASONE SODIUM PHOSPHATE 10 MG/ML IJ SOLN
INTRAMUSCULAR | Status: AC
  Filled 2014-01-23: qty 1

## 2014-01-23 MED ORDER — SODIUM CHLORIDE 0.9 % IJ SOLN
10.0000 mL | Freq: Once | INTRAMUSCULAR | Status: AC
  Administered 2014-01-23: 10 mL
  Filled 2014-01-23: qty 10

## 2014-01-23 MED ORDER — SODIUM CHLORIDE 0.9 % IV SOLN
Freq: Once | INTRAVENOUS | Status: AC
  Administered 2014-01-23: 13:00:00 via INTRAVENOUS

## 2014-01-23 MED ORDER — DIPHENHYDRAMINE HCL 50 MG/ML IJ SOLN
25.0000 mg | Freq: Once | INTRAMUSCULAR | Status: AC
  Administered 2014-01-23: 25 mg via INTRAVENOUS

## 2014-01-23 MED ORDER — PACLITAXEL CHEMO INJECTION 300 MG/50ML
80.0000 mg/m2 | Freq: Once | INTRAVENOUS | Status: AC
  Administered 2014-01-23: 144 mg via INTRAVENOUS
  Filled 2014-01-23: qty 24

## 2014-01-23 MED ORDER — FAMOTIDINE IN NACL 20-0.9 MG/50ML-% IV SOLN
INTRAVENOUS | Status: AC
  Filled 2014-01-23: qty 50

## 2014-01-23 MED ORDER — ONDANSETRON 8 MG/NS 50 ML IVPB
INTRAVENOUS | Status: AC
  Filled 2014-01-23: qty 8

## 2014-01-23 MED ORDER — ONDANSETRON 8 MG/50ML IVPB (CHCC)
8.0000 mg | Freq: Once | INTRAVENOUS | Status: AC
  Administered 2014-01-23: 8 mg via INTRAVENOUS

## 2014-01-23 MED ORDER — DEXAMETHASONE SODIUM PHOSPHATE 10 MG/ML IJ SOLN
10.0000 mg | Freq: Once | INTRAMUSCULAR | Status: AC
  Administered 2014-01-23: 10 mg via INTRAVENOUS

## 2014-01-23 MED ORDER — HEPARIN SOD (PORK) LOCK FLUSH 100 UNIT/ML IV SOLN
500.0000 [IU] | Freq: Once | INTRAVENOUS | Status: AC
  Administered 2014-01-23: 500 [IU]
  Filled 2014-01-23: qty 5

## 2014-01-23 MED ORDER — SODIUM CHLORIDE 0.9 % IJ SOLN
10.0000 mL | INTRAMUSCULAR | Status: DC | PRN
  Administered 2014-01-23: 10 mL
  Filled 2014-01-23: qty 10

## 2014-01-23 MED ORDER — DIPHENHYDRAMINE HCL 50 MG/ML IJ SOLN
INTRAMUSCULAR | Status: AC
  Filled 2014-01-23: qty 1

## 2014-01-23 NOTE — Telephone Encounter (Signed)
Per 09/25 POF, pt scheduled for ov with labs/chemo.......Marland Kitchen  KJ

## 2014-01-23 NOTE — Progress Notes (Signed)
[name] [name]  Telephone:(336) [phone] Fax:(336) [phone]     ID: [name] [name] OB: [birth year]  MR#: [id number]  UDJ#:[id number]  PCP: [name] [name]., MD GYN:  [name] [name] SU: Excell Seltzer OTHER MD: [name]  CHIEF COMPLAINT: Locally advanced breast cancer for chemotherapy f/u. CURRENT TREATMENT: adjuvant chemotherapy  BREAST CANCER HISTORY:  [name] [name] is 53 years old and she first noted a change, like a small hard pea in her left axilla. She brought this to Dr. [name] attention and bilateral diagnostic mammography and left ultrasonography at the breast Center for 07/18/2013 showed a small area of asymmetry in the inner left breast measuring perhaps 5 mm which was not palpable by physical exam. In the right breast there was some loosely grouped calcifications spanning up to 0.7 cm. Ultrasound of the left breast confirmed an ill-defined area of shadowing at the 9:00 position measuring up to 1.3 cm. There was also a 4 mm circumscribed hypoechoic mass at the 10:00 position. This is felt to be probably benign. In the left axilla there were 2 lymph nodes with slightly increased cortical thickening, although the fatty hila were maintained. These were not related to the patient's feeling of a "pea like mass" in her left axilla, which had by then pretty much resolved.   On 08/07/2013 the patient underwent left breast needle core biopsy and this showed ([id number]) and invasive lobular carcinoma (E-cadherin negative) estrogen receptor 70% positive with strong staining intensity, progesterone receptor 70% positive and strong staining intensity, with a proliferation marker of 7% and no HER-2 amplification, the signals ratio being 0.94 in the number per cell 1.50.  Bilateral breast MRI 08/15/2013 found an irregular spiculated mass in the left breast measuring approximately 1.5 cm. There was no abnormal enhancement of the pectoralis. There were at least 2 left axillary lymph nodes and showed  focal cortical thickening. There was a single left internal mammary lymph node at the level of the biopsy-proven malignancy which was not pathologic in appearance. The right breast and axilla were benign.  Genetics testing may 2015 was normal and did not reveal a mutation in the genes tested: ATM, BARD1, BRCA1, BRCA2, BRIP1, CDH1, CHEK2, MRE11A, MUTYH, NBN, NF1, PALB2, PTEN, RAD50, RAD51C, RAD51D, and TP53. She is status post left lumpectomy and axillary lymph node dissection by Dr Excell Seltzer 09/09/2013 for a pT1c pN2, stage IIIa invasive lobular carcinoma, grade 1, repeat HER-2 again negative. Patient had 8/23 LN positive and also evidence of LVI (lymphovascular invasion) and PNI (perineural invasion).  INTERVAL HISTORY: [name] [name] returns today alone, for follow up of her breast cancer. Today is week 4 of 12 planned cycles of weekly paclitaxel. She is tolerating her chemo very well with few complaints. She continues to experience mild fatigues, but denies fevers, chills, nausea, vomiting, or changes in bowel or bladder habits. She has no peripheral neuropathy symptoms, rashes, or mouth sores.  REVIEW OF SYSTEMS: A 10 point review of systems was conducted and is otherwise negative except for what is noted above.     PAST MEDICAL HISTORY: Past Medical History  Diagnosis Date  . Palpitations 2009    Improved; Secondary to premature ventricular contractions. No problems since-pt states was caring for ailing father, anxiety  . Anxiety disorder     Mild: treated by her gynecologist with selective serotonin reuptake inhibitor  . Nonspecific abnormal electrocardiogram (ECG) (EKG) 2009  . GERD (gastroesophageal reflux disease)     rolaids  . Hot flashes   . Malignant neoplasm of breast (  female), unspecified site   . Family history of malignant neoplasm of breast   . Arthritis     hands and feet  . Wears glasses     PAST SURGICAL HISTORY: Past Surgical History  Procedure Laterality Date  .  Thyroidectomy, partial    . Cesarean section    . Endometrial ablation    . Dilation and curettage of uterus    . Laparoscopic assisted vaginal hysterectomy  02/07/2011    Procedure: LAPAROSCOPIC ASSISTED VAGINAL HYSTERECTOMY;  Surgeon: [name] [name];  Location: [facility];  Service: Gynecology;  Laterality: N/A;  . Abdominal hysterectomy  02/07/2011    Procedure: HYSTERECTOMY ABDOMINAL;  Surgeon: [name] [name];  Location: [facility];  Service: Gynecology;  Laterality: N/A;  . Breast lumpectomy with needle localization and axillary lymph node dissection Left 09/09/2013    Procedure: LEFT BREAST LUMPECTOMY WITH NEEDLE LOCALIZATION AND LEFT AXILLARY LYMPH NODE DISSECTION;  Surgeon: [name], MD;  Location: [facility];  Service: General;  Laterality: Left;  . Portacath placement Right 09/29/2013    Procedure: INSERTION PORT-A-CATH;  Surgeon: [name], MD;  Location: [facility];  Service: General;  Laterality: Right;    FAMILY HISTORY Family History  Problem Relation Age of Onset  . Angina Father   . Aortic aneurysm Father   . Dementia Father   . Arrhythmia Maternal Aunt     A fib  . Breast cancer Maternal Aunt 56    currently 77  . Hypertension Other   . Arrhythmia Other     Uncle- a fib  . Arrhythmia Maternal Aunt     A fib  . Breast cancer Maternal Aunt     dx 93s; currently 61s  . Breast cancer Mother     dx 81s. Died at 28  . Colon cancer Maternal Grandmother     dx 58s; died at 35  . Colon cancer Maternal Grandfather     dx 12s; died in 40s  . Cancer Paternal Uncle     unknown primary in early 32s  . Breast cancer Maternal Aunt     dx 82s; died in 16s  The patient's father died from heart disease in the setting of dementia at the age of 22. The patient's mother died at the age of 59. She had been diagnosed with breast cancer at the age of 78. The patient is a single child. Her mother had 4 sisters, 3 of whom had breast cancer  diagnosed at the age of 6, 104, and 33. There is also history of colorectal cancer on the maternal side of the family. BRCA testing negative.  GYNECOLOGIC HISTORY:   menarche age 1, first live birth age 34, the patient is [place] P2. She underwent a hysterectomy without salpingo-oophorectomy in 2012. 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 Her husband [name] [name] also works for [employer], running [employer].. [name] [name] lives in [place] where he works as a Public relations account executive. Daughter [name] [name] this in [place], where she works as an Engineering geologist in a [employer]. The patient has no grandchildren. She is a Information systems manager.   ADVANCED DIRECTIVES: Not in place   HEALTH MAINTENANCE: History  Substance Use Topics  . Smoking status: Never Smoker   . Smokeless tobacco: Not on file  . Alcohol Use: Yes     Comment: drinks 1-2 beers a  week     Colonoscopy:2010   PAP:2012, s/p hysterectomy.  Bone density:2012   Lipid panel: 11/05/2013 TC 219, HDL 41, LDL 137, TG 204  No Known Allergies  Current Outpatient Prescriptions  Medication Sig Dispense Refill  . calcium carbonate-magnesium hydroxide (ROLAIDS) 334 MG CHEW Chew 1 tablet by mouth 2 (two) times daily as needed. For indigestion       . Cholecalciferol (VITAMIN D) 2000 UNITS tablet Take 2,000 Units by mouth daily.      . dexamethasone (DECADRON) 4 MG tablet Take 4 mg by mouth 2 (two) times daily with a meal.      . diclofenac (VOLTAREN) 75 MG EC tablet Take 75 mg by mouth as needed.      . gabapentin (NEURONTIN) 300 MG capsule Take 1 capsule (300 mg total) by mouth at bedtime.  30 capsule  6  . loratadine (CLARITIN) 10 MG tablet Take 10 mg by mouth daily.      . LORazepam (ATIVAN) 0.5 MG tablet Take 1 tablet (0.5 mg total) by mouth at bedtime.  30 tablet  0  . NON FORMULARY       . nystatin-triamcinolone (MYCOLOG II) cream  Apply 1 application topically 2 (two) times daily.  30 g  0  . oxyCODONE (OXY IR/ROXICODONE) 5 MG immediate release tablet Take 1-2 tablets (5-10 mg total) by mouth every 6 (six) hours as needed.  30 tablet  0  . prochlorperazine (COMPAZINE) 10 MG tablet Take 10 mg by mouth every 6 (six) hours as needed for nausea or vomiting.       No current facility-administered medications for this visit.    OBJECTIVE: Filed Vitals:   01/23/14 1154  BP: 128/73  Pulse: 87  Temp: 98.3 F (36.8 C)  Resp: 18     Body mass index is 24.59 kg/(m^2).      GENERAL: Patient is a well appearing female in no acute distress Skin: warm, dry  HEENT: sclerae anicteric, conjunctivae pink, oropharynx clear. No thrush or mucositis.  Lymph Nodes: No cervical or supraclavicular lymphadenopathy  Lungs: clear to auscultation bilaterally, no rales, wheezes, or rhonci  Heart: regular rate and rhythm  Abdomen: round, soft, non tender, positive bowel sounds  Musculoskeletal: No focal spinal tenderness, no peripheral edema  Neuro: non focal, well oriented, positive affect  Breasts: deferred  ECOG FS:1 - Symptomatic but completely ambulatory   LAB RESULTS:  CMP     Component Value Date/Time   NA 140 01/23/2014 1123   K 3.7 01/23/2014 1123   CO2 24 01/23/2014 1123   GLUCOSE 93 01/23/2014 1123   BUN 12.8 01/23/2014 1123   CREATININE 0.7 01/23/2014 1123   CALCIUM 9.0 01/23/2014 1123   PROT 6.5 01/23/2014 1123   ALBUMIN 3.6 01/23/2014 1123   AST 19 01/23/2014 1123   ALT 25 01/23/2014 1123   ALKPHOS 52 01/23/2014 1123   BILITOT 0.68 01/23/2014 1123    I No results found for this basename: SPEP,  UPEP,   kappa and lambda light chains    Lab Results  Component Value Date   WBC 4.5 01/23/2014   NEUTROABS 3.2 01/23/2014   HGB 10.1* 01/23/2014   HCT 29.8* 01/23/2014   MCV 97.8 01/23/2014   PLT 284 01/23/2014      Chemistry      Component Value Date/Time   NA 140 01/23/2014 1123   K 3.7 01/23/2014 1123   CO2 24  01/23/2014 1123   BUN 12.8 01/23/2014 1123     CREATININE 0.7 01/23/2014 1123      Component Value Date/Time   CALCIUM 9.0 01/23/2014 1123   ALKPHOS 52 01/23/2014 1123   AST 19 01/23/2014 1123   ALT 25 01/23/2014 1123   BILITOT 0.68 01/23/2014 1123      STUDIES: No results found.   ASSESSMENT: 52 y.o. BRCA negative [place] woman status post left breast biopsy for 01/18/2014 showing invasive lobular carcinoma (E-cadherin negative), estrogen receptor 70% positive, progesterone receptor 70% positive, with an MIB-1 of 7%, and no HER-2 amplification  (1) ultrasound-guided biopsy of a suspicious lymph node in the left axilla 08/19/2012 was positive  (2) status post left lumpectomy and axillary lymph node dissection 09/09/2013 for a pT1c pN2, stage IIIa invasive lobular carcinoma, grade 1, repeat HER-2 again negative. [She had 8 of 23 axillary lymph nodes involved]  (4) Adjuvant chemotherapy consisting of doxorubicin and cyclophosphamide in dose dense fashion x4, with Neulasta support, completed 12/18/2013; to be followed by paclitaxel weekly x 12  (5) radiation to follow chemotherapy  (6) antiestrogen therapy for 10 years to follow radiation   PLAN: Victorine continues to tolerate her chemo well. The labs were reviewed in detail with her and were stable. She had no nausea, even after stopping the oral dexamethasone. We will proceed with day 1, cycle 4 of the paclitaxel today.   Javonna will return next Friday for labs, an office visit, and week 5 of treatment. She understands and agrees with this plan. She knows a goal of treatment in her case is cure. She has been encouraged to call with any issues that might arise before her next visit here.   CURCIO, KRISTIN, NP Medical Oncology [facility] [phone] 01/23/2014     

## 2014-01-23 NOTE — Patient Instructions (Signed)
[facility] Discharge Instructions for Patients Receiving Chemotherapy  Today you received the following chemotherapy agents: Taxol.  To help prevent nausea and vomiting after your treatment, we encourage you to take your nausea medication: compazine 10 mg every 6 hours as needed.   If you develop nausea and vomiting that is not controlled by your nausea medication, call the clinic.   BELOW ARE SYMPTOMS THAT SHOULD BE REPORTED IMMEDIATELY:  *FEVER GREATER THAN 100.5 F  *CHILLS WITH OR WITHOUT FEVER  NAUSEA AND VOMITING THAT IS NOT CONTROLLED WITH YOUR NAUSEA MEDICATION  *UNUSUAL SHORTNESS OF BREATH  *UNUSUAL BRUISING OR BLEEDING  TENDERNESS IN MOUTH AND THROAT WITH OR WITHOUT PRESENCE OF ULCERS  *URINARY PROBLEMS  *BOWEL PROBLEMS  UNUSUAL RASH Items with * indicate a potential emergency and should be followed up as soon as possible.  Feel free to call the clinic you have any questions or concerns. The clinic phone number is [phone].

## 2014-01-26 ENCOUNTER — Encounter: Payer: Self-pay | Admitting: Nurse Practitioner

## 2014-01-30 ENCOUNTER — Encounter: Payer: Self-pay | Admitting: Nurse Practitioner

## 2014-01-30 ENCOUNTER — Other Ambulatory Visit (HOSPITAL_BASED_OUTPATIENT_CLINIC_OR_DEPARTMENT_OTHER): Payer: BC Managed Care – PPO

## 2014-01-30 ENCOUNTER — Ambulatory Visit (HOSPITAL_BASED_OUTPATIENT_CLINIC_OR_DEPARTMENT_OTHER): Payer: BC Managed Care – PPO

## 2014-01-30 ENCOUNTER — Ambulatory Visit (HOSPITAL_BASED_OUTPATIENT_CLINIC_OR_DEPARTMENT_OTHER): Payer: BC Managed Care – PPO | Admitting: Nurse Practitioner

## 2014-01-30 VITALS — BP 118/68 | HR 84 | Temp 98.1°F | Resp 18 | Ht 66.0 in | Wt 150.9 lb

## 2014-01-30 DIAGNOSIS — Z5111 Encounter for antineoplastic chemotherapy: Secondary | ICD-10-CM

## 2014-01-30 DIAGNOSIS — C50212 Malignant neoplasm of upper-inner quadrant of left female breast: Secondary | ICD-10-CM

## 2014-01-30 DIAGNOSIS — J3489 Other specified disorders of nose and nasal sinuses: Secondary | ICD-10-CM

## 2014-01-30 DIAGNOSIS — C50812 Malignant neoplasm of overlapping sites of left female breast: Secondary | ICD-10-CM

## 2014-01-30 DIAGNOSIS — F411 Generalized anxiety disorder: Secondary | ICD-10-CM

## 2014-01-30 DIAGNOSIS — C773 Secondary and unspecified malignant neoplasm of axilla and upper limb lymph nodes: Secondary | ICD-10-CM

## 2014-01-30 LAB — COMPREHENSIVE METABOLIC PANEL (CC13)
ALK PHOS: 60 U/L (ref 40–150)
ALT: 40 U/L (ref 0–55)
AST: 22 U/L (ref 5–34)
Albumin: 3.8 g/dL (ref 3.5–5.0)
Anion Gap: 8 mEq/L (ref 3–11)
BILIRUBIN TOTAL: 0.74 mg/dL (ref 0.20–1.20)
BUN: 11.1 mg/dL (ref 7.0–26.0)
CO2: 25 mEq/L (ref 22–29)
CREATININE: 0.8 mg/dL (ref 0.6–1.1)
Calcium: 9.3 mg/dL (ref 8.4–10.4)
Chloride: 108 mEq/L (ref 98–109)
GLUCOSE: 96 mg/dL (ref 70–140)
Potassium: 4.2 mEq/L (ref 3.5–5.1)
Sodium: 142 mEq/L (ref 136–145)
Total Protein: 7 g/dL (ref 6.4–8.3)

## 2014-01-30 LAB — CBC WITH DIFFERENTIAL/PLATELET
BASO%: 1.2 % (ref 0.0–2.0)
Basophils Absolute: 0.1 10*3/uL (ref 0.0–0.1)
EOS ABS: 0.2 10*3/uL (ref 0.0–0.5)
EOS%: 3.4 % (ref 0.0–7.0)
HCT: 31.3 % — ABNORMAL LOW (ref 34.8–46.6)
HEMOGLOBIN: 10.6 g/dL — AB (ref 11.6–15.9)
LYMPH%: 12.4 % — ABNORMAL LOW (ref 14.0–49.7)
MCH: 33.7 pg (ref 25.1–34.0)
MCHC: 34 g/dL (ref 31.5–36.0)
MCV: 99.1 fL (ref 79.5–101.0)
MONO#: 0.6 10*3/uL (ref 0.1–0.9)
MONO%: 11.9 % (ref 0.0–14.0)
NEUT%: 71.1 % (ref 38.4–76.8)
NEUTROS ABS: 3.4 10*3/uL (ref 1.5–6.5)
Platelets: 307 10*3/uL (ref 145–400)
RBC: 3.16 10*6/uL — ABNORMAL LOW (ref 3.70–5.45)
RDW: 16.7 % — AB (ref 11.2–14.5)
WBC: 4.9 10*3/uL (ref 3.9–10.3)
lymph#: 0.6 10*3/uL — ABNORMAL LOW (ref 0.9–3.3)

## 2014-01-30 MED ORDER — DEXAMETHASONE SODIUM PHOSPHATE 10 MG/ML IJ SOLN
10.0000 mg | Freq: Once | INTRAMUSCULAR | Status: AC
  Administered 2014-01-30: 10 mg via INTRAVENOUS

## 2014-01-30 MED ORDER — ONDANSETRON 8 MG/50ML IVPB (CHCC)
8.0000 mg | Freq: Once | INTRAVENOUS | Status: AC
  Administered 2014-01-30: 8 mg via INTRAVENOUS

## 2014-01-30 MED ORDER — SODIUM CHLORIDE 0.9 % IV SOLN
Freq: Once | INTRAVENOUS | Status: AC
  Administered 2014-01-30: 13:00:00 via INTRAVENOUS

## 2014-01-30 MED ORDER — PACLITAXEL CHEMO INJECTION 300 MG/50ML
80.0000 mg/m2 | Freq: Once | INTRAVENOUS | Status: AC
  Administered 2014-01-30: 144 mg via INTRAVENOUS
  Filled 2014-01-30: qty 24

## 2014-01-30 MED ORDER — ONDANSETRON 8 MG/NS 50 ML IVPB
INTRAVENOUS | Status: AC
  Filled 2014-01-30: qty 8

## 2014-01-30 MED ORDER — FAMOTIDINE IN NACL 20-0.9 MG/50ML-% IV SOLN
INTRAVENOUS | Status: AC
  Filled 2014-01-30: qty 50

## 2014-01-30 MED ORDER — DIPHENHYDRAMINE HCL 50 MG/ML IJ SOLN
INTRAMUSCULAR | Status: AC
  Filled 2014-01-30: qty 1

## 2014-01-30 MED ORDER — FAMOTIDINE IN NACL 20-0.9 MG/50ML-% IV SOLN
20.0000 mg | Freq: Once | INTRAVENOUS | Status: AC
  Administered 2014-01-30: 20 mg via INTRAVENOUS

## 2014-01-30 MED ORDER — DIPHENHYDRAMINE HCL 50 MG/ML IJ SOLN
25.0000 mg | Freq: Once | INTRAMUSCULAR | Status: AC
  Administered 2014-01-30: 25 mg via INTRAVENOUS

## 2014-01-30 MED ORDER — SODIUM CHLORIDE 0.9 % IJ SOLN
10.0000 mL | INTRAMUSCULAR | Status: DC | PRN
  Administered 2014-01-30: 10 mL
  Filled 2014-01-30: qty 10

## 2014-01-30 MED ORDER — MUPIROCIN CALCIUM 2 % NA OINT: 1.0000 "application " | TOPICAL_OINTMENT | Freq: Two times a day (BID) | NASAL | Status: DC

## 2014-01-30 MED ORDER — DEXAMETHASONE SODIUM PHOSPHATE 10 MG/ML IJ SOLN
INTRAMUSCULAR | Status: AC
  Filled 2014-01-30: qty 1

## 2014-01-30 MED ORDER — HEPARIN SOD (PORK) LOCK FLUSH 100 UNIT/ML IV SOLN
500.0000 [IU] | Freq: Once | INTRAVENOUS | Status: AC | PRN
  Administered 2014-01-30: 500 [IU]
  Filled 2014-01-30: qty 5

## 2014-01-30 NOTE — Patient Instructions (Signed)
[facility] Discharge Instructions for Patients Receiving Chemotherapy  Today you received the following chemotherapy agents: Taxol.  To help prevent nausea and vomiting after your treatment, we encourage you to take your nausea medication as prescribed.   If you develop nausea and vomiting that is not controlled by your nausea medication, call the clinic.   BELOW ARE SYMPTOMS THAT SHOULD BE REPORTED IMMEDIATELY:  *FEVER GREATER THAN 100.5 F  *CHILLS WITH OR WITHOUT FEVER  NAUSEA AND VOMITING THAT IS NOT CONTROLLED WITH YOUR NAUSEA MEDICATION  *UNUSUAL SHORTNESS OF BREATH  *UNUSUAL BRUISING OR BLEEDING  TENDERNESS IN MOUTH AND THROAT WITH OR WITHOUT PRESENCE OF ULCERS  *URINARY PROBLEMS  *BOWEL PROBLEMS  UNUSUAL RASH Items with * indicate a potential emergency and should be followed up as soon as possible.  Feel free to call the clinic you have any questions or concerns. The clinic phone number is [phone].    

## 2014-01-30 NOTE — Progress Notes (Signed)
[name] [name]  [phone] [phone]) [phone]     ID: [name] [name] OB: 01/05/1961  MR#: [id number]  QIW#:[id number]  PCP: [name]., MD GYN:  [name] SU: Excell Seltzer OTHER MD: [name]  CHIEF COMPLAINT: Locally advanced breast cancer for chemotherapy f/u. CURRENT TREATMENT: adjuvant chemotherapy  BREAST CANCER HISTORY:  [name] [name] is 53 years old and she first noted a change, like a small hard pea in her left axilla. She brought this to Dr. [name] attention and bilateral diagnostic mammography and left ultrasonography at the breast Center for 07/18/2013 showed a small area of asymmetry in the inner left breast measuring perhaps 5 mm which was not palpable by physical exam. In the right breast there was some loosely grouped calcifications spanning up to 0.7 cm. Ultrasound of the left breast confirmed an ill-defined area of shadowing at the 9:00 position measuring up to 1.3 cm. There was also a 4 mm circumscribed hypoechoic mass at the 10:00 position. This is felt to be probably benign. In the left axilla there were 2 lymph nodes with slightly increased cortical thickening, although the fatty hila were maintained. These were not related to the patient's feeling of a "pea like mass" in her left axilla, which had by then pretty much resolved.   On 08/07/2013 the patient underwent left breast needle core biopsy and this showed ([id number]) and invasive lobular carcinoma (E-cadherin negative) estrogen receptor 70% positive with strong staining intensity, progesterone receptor 70% positive and strong staining intensity, with a proliferation marker of 7% and no HER-2 amplification, the signals ratio being 0.94 in the number per cell 1.50.  Bilateral breast MRI 08/15/2013 found an irregular spiculated mass in the left breast measuring approximately 1.5 cm. There was no abnormal enhancement of the pectoralis. There were at least 2 left axillary lymph nodes and showed  focal cortical thickening. There was a single left internal mammary lymph node at the level of the biopsy-proven malignancy which was not pathologic in appearance. The right breast and axilla were benign.  Genetics testing may 2015 was normal and did not reveal a mutation in the genes tested: ATM, BARD1, BRCA1, BRCA2, BRIP1, CDH1, CHEK2, MRE11A, MUTYH, NBN, NF1, PALB2, PTEN, RAD50, RAD51C, RAD51D, and TP53. She is status post left lumpectomy and axillary lymph node dissection by Dr Excell Seltzer 09/09/2013 for a pT1c pN2, stage IIIa invasive lobular carcinoma, grade 1, repeat HER-2 again negative. Patient had 8/23 LN positive and also evidence of LVI (lymphovascular invasion) and PNI (perineural invasion).  INTERVAL HISTORY: [name] [name] returns today for follow up of her breast cancer, accompanied by her husband, [name] [name]. Today is week 5 of 12 planned weekly cycles of paclitaxel. She continues to tolerate the taxol beautifully with few complaints. She has some mild fatigue, but remains active at home. She is achy this past week, and that is likely a combination of the weather changes and the paclitaxel itself. She is taking NSAIDs PRN as needed. Her nose is irritated and dry, sometimes she sees specks of blood after wiping. She does not like the administration of nasal sprays, but uses a humidifier at home. As always, Maddeline has no fevers, chills, nausea, vomiting, or changes in bowel or bladder habits. She has some taste changes, but her appetite remains unchanged. She has no peripheral neuropathy, rashes, or mouth sores.   REVIEW OF SYSTEMS: A detailed review of systems is otherwise negative, except as noted above.   PAST MEDICAL HISTORY: Past Medical History  Diagnosis Date  .  Palpitations 2009    Improved; Secondary to premature ventricular contractions. No problems since-pt states was caring for ailing father, anxiety  . Anxiety disorder     Mild: treated by her gynecologist with selective serotonin reuptake  inhibitor  . Nonspecific abnormal electrocardiogram (ECG) (EKG) 2009  . GERD (gastroesophageal reflux disease)     rolaids  . Hot flashes   . Malignant neoplasm of breast (female), unspecified site   . Family history of malignant neoplasm of breast   . Arthritis     hands and feet  . Wears glasses     PAST SURGICAL HISTORY: Past Surgical History  Procedure Laterality Date  . Thyroidectomy, partial    . Cesarean section    . Endometrial ablation    . Dilation and curettage of uterus    . Laparoscopic assisted vaginal hysterectomy  02/07/2011    Procedure: LAPAROSCOPIC ASSISTED VAGINAL HYSTERECTOMY;  Surgeon: [name];  Location: [facility];  Service: Gynecology;  Laterality: N/A;  . Abdominal hysterectomy  02/07/2011    Procedure: HYSTERECTOMY ABDOMINAL;  Surgeon: [name];  Location: [facility];  Service: Gynecology;  Laterality: N/A;  . Breast lumpectomy with needle localization and axillary lymph node dissection Left 09/09/2013    Procedure: LEFT BREAST LUMPECTOMY WITH NEEDLE LOCALIZATION AND LEFT AXILLARY LYMPH NODE DISSECTION;  Surgeon: [name], MD;  Location: [facility];  Service: General;  Laterality: Left;  . Portacath placement Right 09/29/2013    Procedure: INSERTION PORT-A-CATH;  Surgeon: [name], MD;  Location: [facility];  Service: General;  Laterality: Right;    FAMILY HISTORY Family History  Problem Relation Age of Onset  . Angina Father   . Aortic aneurysm Father   . Dementia Father   . Arrhythmia Maternal Aunt     A fib  . Breast cancer Maternal Aunt 56    currently 65  . Hypertension Other   . Arrhythmia Other     Uncle- a fib  . Arrhythmia Maternal Aunt     A fib  . Breast cancer Maternal Aunt     dx 26s; currently 26s  . Breast cancer Mother     dx 60s. Died at 16  . Colon cancer Maternal Grandmother     dx 9s; died at 75  . Colon cancer Maternal Grandfather     dx 45s; died in 66s  . Cancer  Paternal Uncle     unknown primary in early 17s  . Breast cancer Maternal Aunt     dx 58s; died in 12s  The patient's father died from heart disease in the setting of dementia at the age of 68. The patient's mother died at the age of 4. She had been diagnosed with breast cancer at the age of 15. The patient is a single child. Her mother had 4 sisters, 3 of whom had breast cancer diagnosed at the age of 56, 42, and 31. There is also history of colorectal cancer on the maternal side of the family. BRCA testing negative.  GYNECOLOGIC HISTORY:   menarche age 76, first live birth age 31, the patient is Claude P2. She underwent a hysterectomy without salpingo-oophorectomy in 2012. 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 Her husband [name] [name] also works for [employer], running [employer].. Evorn Gong lives in [place] where he works as a Public relations account executive.  Daughter Darrick Penna this in [place], where she works as an Engineering geologist in a [employer]. The patient has no grandchildren. She is a Information systems manager.   ADVANCED DIRECTIVES: Not in place   HEALTH MAINTENANCE: History  Substance Use Topics  . Smoking status: Never Smoker   . Smokeless tobacco: Not on file  . Alcohol Use: Yes     Comment: drinks 1-2 beers a week     Colonoscopy:2010   PAP:2012, s/p hysterectomy.  Bone density:2012   Lipid panel: 11/05/2013 TC 219, HDL 41, LDL 137, TG 204  No Known Allergies  Current Outpatient Prescriptions  Medication Sig Dispense Refill  . calcium carbonate-magnesium hydroxide (ROLAIDS) 334 MG CHEW Chew 1 tablet by mouth 2 (two) times daily as needed. For indigestion       . Cholecalciferol (VITAMIN D) 2000 UNITS tablet Take 2,000 Units by mouth daily.      Marland Kitchen dexamethasone (DECADRON) 4 MG tablet Take 4 mg by mouth 2 (two) times daily with a meal.      . diclofenac (VOLTAREN) 75 MG EC  tablet Take 75 mg by mouth as needed.      Marland Kitchen LORazepam (ATIVAN) 0.5 MG tablet Take 1 tablet (0.5 mg total) by mouth at bedtime.  30 tablet  0  . NON FORMULARY       . gabapentin (NEURONTIN) 300 MG capsule Take 1 capsule (300 mg total) by mouth at bedtime.  30 capsule  6  . loratadine (CLARITIN) 10 MG tablet Take 10 mg by mouth daily.      Marland Kitchen nystatin-triamcinolone (MYCOLOG II) cream Apply 1 application topically 2 (two) times daily.  30 g  0  . oxyCODONE (OXY IR/ROXICODONE) 5 MG immediate release tablet Take 1-2 tablets (5-10 mg total) by mouth every 6 (six) hours as needed.  30 tablet  0  . prochlorperazine (COMPAZINE) 10 MG tablet Take 10 mg by mouth every 6 (six) hours as needed for nausea or vomiting.       No current facility-administered medications for this visit.    OBJECTIVE: Filed Vitals:   01/30/14 1143  BP: 118/68  Pulse: 84  Temp: 98.1 F (36.7 C)  Resp: 18     Body mass index is 24.37 kg/(m^2).      GENERAL: Patient is a well appearing female in no acute distress Skin: warm, dry  HEENT: sclerae anicteric, conjunctivae pink, oropharynx clear. No thrush or mucositis.  Lymph Nodes: No cervical or supraclavicular lymphadenopathy  Lungs: clear to auscultation bilaterally, no rales, wheezes, or rhonci  Heart: regular rate and rhythm  Abdomen: round, soft, non tender, positive bowel sounds  Musculoskeletal: No focal spinal tenderness, no peripheral edema  Neuro: non focal, well oriented, positive affect  Breasts: deferred  ECOG FS:1 - Symptomatic but completely ambulatory   LAB RESULTS:  CMP     Component Value Date/Time   NA 142 01/30/2014 1108   K 4.2 01/30/2014 1108   CO2 25 01/30/2014 1108   GLUCOSE 96 01/30/2014 1108   BUN 11.1 01/30/2014 1108   CREATININE 0.8 01/30/2014 1108   CALCIUM 9.3 01/30/2014 1108   PROT 7.0 01/30/2014 1108   ALBUMIN 3.8 01/30/2014 1108   AST 22 01/30/2014 1108   ALT 40 01/30/2014 1108   ALKPHOS 60 01/30/2014 1108   BILITOT 0.74 01/30/2014  1108    I No results found for this basename: SPEP,  UPEP,   kappa and lambda light chains    Lab Results  Component Value Date  WBC 4.9 01/30/2014   NEUTROABS 3.4 01/30/2014   HGB 10.6* 01/30/2014   HCT 31.3* 01/30/2014   MCV 99.1 01/30/2014   PLT 307 01/30/2014      Chemistry      Component Value Date/Time   NA 142 01/30/2014 1108   K 4.2 01/30/2014 1108   CO2 25 01/30/2014 1108   BUN 11.1 01/30/2014 1108   CREATININE 0.8 01/30/2014 1108      Component Value Date/Time   CALCIUM 9.3 01/30/2014 1108   ALKPHOS 60 01/30/2014 1108   AST 22 01/30/2014 1108   ALT 40 01/30/2014 1108   BILITOT 0.74 01/30/2014 1108      STUDIES: No results found.   ASSESSMENT: 53 y.o. BRCA negative [place] woman status post left breast biopsy for 01/18/2014 showing invasive lobular carcinoma (E-cadherin negative), estrogen receptor 70% positive, progesterone receptor 70% positive, with an MIB-1 of 7%, and no HER-2 amplification  (1) ultrasound-guided biopsy of a suspicious lymph node in the left axilla 08/19/2012 was positive  (2) status post left lumpectomy and axillary lymph node dissection 09/09/2013 for a pT1c pN2, stage IIIa invasive lobular carcinoma, grade 1, repeat HER-2 again negative. [She had 8 of 23 axillary lymph nodes involved]  (4) Adjuvant chemotherapy consisting of doxorubicin and cyclophosphamide in dose dense fashion x4, with Neulasta support, completed 12/18/2013; to be followed by paclitaxel weekly x 12  (5) radiation to follow chemotherapy  (6) antiestrogen therapy for 10 years to follow radiation   PLAN: Najee continues to perform well on chemotherapy. The labs were reviewed in detail and continue to be stable. We will proceed with day 1, cycle 4 of the paclitaxel today. For her nose irritation I am ordering some Bactroban ointment for use over the next week.   Katelyne will return next Friday for labs, an office visit, and week 6 of treatment. She understands and agrees with  this plan. She knows a goal of treatment in her case is cure. She has been encouraged to call with any issues that might arise before her next visit here.   Marcelino Duster, [place] [phone] 01/30/2014

## 2014-02-06 ENCOUNTER — Ambulatory Visit: Payer: BC Managed Care – PPO

## 2014-02-06 ENCOUNTER — Ambulatory Visit (HOSPITAL_BASED_OUTPATIENT_CLINIC_OR_DEPARTMENT_OTHER): Payer: BLUE CROSS/BLUE SHIELD

## 2014-02-06 ENCOUNTER — Ambulatory Visit (HOSPITAL_BASED_OUTPATIENT_CLINIC_OR_DEPARTMENT_OTHER): Payer: BC Managed Care – PPO | Admitting: Nurse Practitioner

## 2014-02-06 ENCOUNTER — Other Ambulatory Visit (HOSPITAL_BASED_OUTPATIENT_CLINIC_OR_DEPARTMENT_OTHER): Payer: BC Managed Care – PPO

## 2014-02-06 ENCOUNTER — Encounter: Payer: Self-pay | Admitting: Nurse Practitioner

## 2014-02-06 VITALS — BP 125/76 | HR 90 | Temp 98.8°F | Resp 18 | Ht 66.0 in | Wt 150.1 lb

## 2014-02-06 DIAGNOSIS — C50212 Malignant neoplasm of upper-inner quadrant of left female breast: Secondary | ICD-10-CM

## 2014-02-06 DIAGNOSIS — T451X5A Adverse effect of antineoplastic and immunosuppressive drugs, initial encounter: Secondary | ICD-10-CM

## 2014-02-06 DIAGNOSIS — C773 Secondary and unspecified malignant neoplasm of axilla and upper limb lymph nodes: Secondary | ICD-10-CM

## 2014-02-06 DIAGNOSIS — Z170 Estrogen receptor positive status [ER+]: Secondary | ICD-10-CM

## 2014-02-06 DIAGNOSIS — Z95828 Presence of other vascular implants and grafts: Secondary | ICD-10-CM

## 2014-02-06 DIAGNOSIS — F411 Generalized anxiety disorder: Secondary | ICD-10-CM

## 2014-02-06 DIAGNOSIS — Z5111 Encounter for antineoplastic chemotherapy: Secondary | ICD-10-CM

## 2014-02-06 DIAGNOSIS — D630 Anemia in neoplastic disease: Secondary | ICD-10-CM

## 2014-02-06 DIAGNOSIS — R5383 Other fatigue: Secondary | ICD-10-CM

## 2014-02-06 DIAGNOSIS — C50812 Malignant neoplasm of overlapping sites of left female breast: Secondary | ICD-10-CM

## 2014-02-06 DIAGNOSIS — D61810 Antineoplastic chemotherapy induced pancytopenia: Secondary | ICD-10-CM

## 2014-02-06 DIAGNOSIS — D6481 Anemia due to antineoplastic chemotherapy: Secondary | ICD-10-CM

## 2014-02-06 HISTORY — DX: Anemia in neoplastic disease: D63.0

## 2014-02-06 LAB — COMPREHENSIVE METABOLIC PANEL (CC13)
ALBUMIN: 3.6 g/dL (ref 3.5–5.0)
ALT: 27 U/L (ref 0–55)
ANION GAP: 8 meq/L (ref 3–11)
AST: 17 U/L (ref 5–34)
Alkaline Phosphatase: 56 U/L (ref 40–150)
BUN: 11.3 mg/dL (ref 7.0–26.0)
CO2: 26 meq/L (ref 22–29)
Calcium: 9.2 mg/dL (ref 8.4–10.4)
Chloride: 106 mEq/L (ref 98–109)
Creatinine: 0.8 mg/dL (ref 0.6–1.1)
GLUCOSE: 91 mg/dL (ref 70–140)
POTASSIUM: 3.9 meq/L (ref 3.5–5.1)
SODIUM: 139 meq/L (ref 136–145)
TOTAL PROTEIN: 6.5 g/dL (ref 6.4–8.3)
Total Bilirubin: 0.67 mg/dL (ref 0.20–1.20)

## 2014-02-06 LAB — CBC WITH DIFFERENTIAL/PLATELET
BASO%: 1.2 % (ref 0.0–2.0)
Basophils Absolute: 0.1 10*3/uL (ref 0.0–0.1)
EOS ABS: 0.1 10*3/uL (ref 0.0–0.5)
EOS%: 2.3 % (ref 0.0–7.0)
HCT: 28.8 % — ABNORMAL LOW (ref 34.8–46.6)
HGB: 9.7 g/dL — ABNORMAL LOW (ref 11.6–15.9)
LYMPH#: 0.6 10*3/uL — AB (ref 0.9–3.3)
LYMPH%: 11.6 % — AB (ref 14.0–49.7)
MCH: 33.2 pg (ref 25.1–34.0)
MCHC: 33.6 g/dL (ref 31.5–36.0)
MCV: 98.7 fL (ref 79.5–101.0)
MONO#: 0.7 10*3/uL (ref 0.1–0.9)
MONO%: 12.4 % (ref 0.0–14.0)
NEUT%: 72.5 % (ref 38.4–76.8)
NEUTROS ABS: 3.9 10*3/uL (ref 1.5–6.5)
PLATELETS: 366 10*3/uL (ref 145–400)
RBC: 2.91 10*6/uL — ABNORMAL LOW (ref 3.70–5.45)
RDW: 16.3 % — AB (ref 11.2–14.5)
WBC: 5.3 10*3/uL (ref 3.9–10.3)

## 2014-02-06 MED ORDER — ONDANSETRON 8 MG/50ML IVPB (CHCC)
8.0000 mg | Freq: Once | INTRAVENOUS | Status: AC
  Administered 2014-02-06: 8 mg via INTRAVENOUS

## 2014-02-06 MED ORDER — PACLITAXEL CHEMO INJECTION 300 MG/50ML
80.0000 mg/m2 | Freq: Once | INTRAVENOUS | Status: AC
  Administered 2014-02-06: 144 mg via INTRAVENOUS
  Filled 2014-02-06: qty 24

## 2014-02-06 MED ORDER — FAMOTIDINE IN NACL 20-0.9 MG/50ML-% IV SOLN
INTRAVENOUS | Status: AC
  Filled 2014-02-06: qty 50

## 2014-02-06 MED ORDER — DIPHENHYDRAMINE HCL 50 MG/ML IJ SOLN
25.0000 mg | Freq: Once | INTRAMUSCULAR | Status: AC
  Administered 2014-02-06: 25 mg via INTRAVENOUS

## 2014-02-06 MED ORDER — SODIUM CHLORIDE 0.9 % IJ SOLN
10.0000 mL | INTRAMUSCULAR | Status: DC | PRN
  Administered 2014-02-06: 10 mL via INTRAVENOUS
  Filled 2014-02-06: qty 10

## 2014-02-06 MED ORDER — ONDANSETRON 8 MG/NS 50 ML IVPB
INTRAVENOUS | Status: AC
  Filled 2014-02-06: qty 8

## 2014-02-06 MED ORDER — DEXAMETHASONE SODIUM PHOSPHATE 10 MG/ML IJ SOLN
10.0000 mg | Freq: Once | INTRAMUSCULAR | Status: AC
Start: 2014-02-06 — End: 2014-02-06
  Administered 2014-02-06: 10 mg via INTRAVENOUS

## 2014-02-06 MED ORDER — HEPARIN SOD (PORK) LOCK FLUSH 100 UNIT/ML IV SOLN
500.0000 [IU] | Freq: Once | INTRAVENOUS | Status: AC | PRN
  Administered 2014-02-06: 500 [IU]
  Filled 2014-02-06: qty 5

## 2014-02-06 MED ORDER — FAMOTIDINE IN NACL 20-0.9 MG/50ML-% IV SOLN
20.0000 mg | Freq: Once | INTRAVENOUS | Status: AC
  Administered 2014-02-06: 20 mg via INTRAVENOUS

## 2014-02-06 MED ORDER — SODIUM CHLORIDE 0.9 % IJ SOLN
10.0000 mL | INTRAMUSCULAR | Status: DC | PRN
  Administered 2014-02-06: 10 mL
  Filled 2014-02-06: qty 10

## 2014-02-06 MED ORDER — HEPARIN SOD (PORK) LOCK FLUSH 100 UNIT/ML IV SOLN
500.0000 [IU] | Freq: Once | INTRAVENOUS | Status: AC
  Administered 2014-02-06: 500 [IU] via INTRAVENOUS
  Filled 2014-02-06: qty 5

## 2014-02-06 MED ORDER — SODIUM CHLORIDE 0.9 % IV SOLN
Freq: Once | INTRAVENOUS | Status: AC
  Administered 2014-02-06: 14:00:00 via INTRAVENOUS

## 2014-02-06 MED ORDER — DEXAMETHASONE SODIUM PHOSPHATE 10 MG/ML IJ SOLN
INTRAMUSCULAR | Status: AC
  Filled 2014-02-06: qty 1

## 2014-02-06 MED ORDER — DIPHENHYDRAMINE HCL 50 MG/ML IJ SOLN
INTRAMUSCULAR | Status: AC
  Filled 2014-02-06: qty 1

## 2014-02-06 NOTE — Patient Instructions (Signed)

## 2014-02-06 NOTE — Patient Instructions (Signed)
[facility] Discharge Instructions for Patients Receiving Chemotherapy  Today you received the following chemotherapy agents: Taxol.  To help prevent nausea and vomiting after your treatment, we encourage you to take your nausea medication as prescribed.   If you develop nausea and vomiting that is not controlled by your nausea medication, call the clinic.   BELOW ARE SYMPTOMS THAT SHOULD BE REPORTED IMMEDIATELY:  *FEVER GREATER THAN 100.5 F  *CHILLS WITH OR WITHOUT FEVER  NAUSEA AND VOMITING THAT IS NOT CONTROLLED WITH YOUR NAUSEA MEDICATION  *UNUSUAL SHORTNESS OF BREATH  *UNUSUAL BRUISING OR BLEEDING  TENDERNESS IN MOUTH AND THROAT WITH OR WITHOUT PRESENCE OF ULCERS  *URINARY PROBLEMS  *BOWEL PROBLEMS  UNUSUAL RASH Items with * indicate a potential emergency and should be followed up as soon as possible.  Feel free to call the clinic you have any questions or concerns. The clinic phone number is [phone].    

## 2014-02-06 NOTE — Progress Notes (Signed)
[name] [name]  [phone] Fax:([phone]) [phone]     ID: [name] OB: [birth year]  MR#: [id number]  ASN#:[id number]  PCP: [name]., MD GYN:  [name] SU: Excell Seltzer OTHER MD: [name]  CHIEF COMPLAINT: Locally advanced breast cancer for chemotherapy f/u. CURRENT TREATMENT: adjuvant chemotherapy  BREAST CANCER HISTORY:  [name] [name] is 53 years old and she first noted a change, like a small hard pea in her left axilla. She brought this to Dr. [name] attention and bilateral diagnostic mammography and left ultrasonography at the breast Center for 07/18/2013 showed a small area of asymmetry in the inner left breast measuring perhaps 5 mm which was not palpable by physical exam. In the right breast there was some loosely grouped calcifications spanning up to 0.7 cm. Ultrasound of the left breast confirmed an ill-defined area of shadowing at the 9:00 position measuring up to 1.3 cm. There was also a 4 mm circumscribed hypoechoic mass at the 10:00 position. This is felt to be probably benign. In the left axilla there were 2 lymph nodes with slightly increased cortical thickening, although the fatty hila were maintained. These were not related to the patient's feeling of a "pea like mass" in her left axilla, which had by then pretty much resolved.   On 08/07/2013 the patient underwent left breast needle core biopsy and this showed ([id number]) and invasive lobular carcinoma (E-cadherin negative) estrogen receptor 70% positive with strong staining intensity, progesterone receptor 70% positive and strong staining intensity, with a proliferation marker of 7% and no HER-2 amplification, the signals ratio being 0.94 in the number per cell 1.50.  Bilateral breast MRI 08/15/2013 found an irregular spiculated mass in the left breast measuring approximately 1.5 cm. There was no abnormal enhancement of the pectoralis. There were at least 2 left axillary lymph nodes and showed  focal cortical thickening. There was a single left internal mammary lymph node at the level of the biopsy-proven malignancy which was not pathologic in appearance. The right breast and axilla were benign.  Genetics testing may 2015 was normal and did not reveal a mutation in the genes tested: ATM, BARD1, BRCA1, BRCA2, BRIP1, CDH1, CHEK2, MRE11A, MUTYH, NBN, NF1, PALB2, PTEN, RAD50, RAD51C, RAD51D, and TP53. She is status post left lumpectomy and axillary lymph node dissection by Dr Excell Seltzer 09/09/2013 for a pT1c pN2, stage IIIa invasive lobular carcinoma, grade 1, repeat HER-2 again negative. Patient had 8/23 LN positive and also evidence of LVI (lymphovascular invasion) and PNI (perineural invasion).  INTERVAL HISTORY: [name] [name] returns today for follow up of her breast cancer. Today is week 6 of 12 planned weekly cycles of paclitaxel. She continues to tolerate treatment well with few complaints. She feels "drained" sometimes energy wise, but she continues to work and power through. She has some body aches after treatment, but this is managed well with NSAIDs PRN. Her nose has healed with the use of bactroban and a humidifier at home. Again, [name] [name] denies fevers, chills, nausea, vomiting, or changes in bowel or bladder habits. She has no mouth sores, rashes, or peripheral neuropathy. Her appetite is healthy, despite some changes in taste. She has no shortness of breath, chest pain, cough, or palpitations.   REVIEW OF SYSTEMS: A detailed review of systems is otherwise negative, except as noted above.   PAST MEDICAL HISTORY: Past Medical History  Diagnosis Date  . Palpitations 2009    Improved; Secondary to premature ventricular contractions. No problems since-pt states was caring for ailing father, anxiety  .  Anxiety disorder     Mild: treated by her gynecologist with selective serotonin reuptake inhibitor  . Nonspecific abnormal electrocardiogram (ECG) (EKG) 2009  . GERD (gastroesophageal reflux  disease)     rolaids  . Hot flashes   . Malignant neoplasm of breast (female), unspecified site   . Family history of malignant neoplasm of breast   . Arthritis     hands and feet  . Wears glasses     PAST SURGICAL HISTORY: Past Surgical History  Procedure Laterality Date  . Thyroidectomy, partial    . Cesarean section    . Endometrial ablation    . Dilation and curettage of uterus    . Laparoscopic assisted vaginal hysterectomy  02/07/2011    Procedure: LAPAROSCOPIC ASSISTED VAGINAL HYSTERECTOMY;  Surgeon: [name];  Location: [facility];  Service: Gynecology;  Laterality: N/A;  . Abdominal hysterectomy  02/07/2011    Procedure: HYSTERECTOMY ABDOMINAL;  Surgeon: [name];  Location: [facility];  Service: Gynecology;  Laterality: N/A;  . Breast lumpectomy with needle localization and axillary lymph node dissection Left 09/09/2013    Procedure: LEFT BREAST LUMPECTOMY WITH NEEDLE LOCALIZATION AND LEFT AXILLARY LYMPH NODE DISSECTION;  Surgeon: [name], MD;  Location: [facility];  Service: General;  Laterality: Left;  . Portacath placement Right 09/29/2013    Procedure: INSERTION PORT-A-CATH;  Surgeon: [name], MD;  Location: [facility];  Service: General;  Laterality: Right;    FAMILY HISTORY Family History  Problem Relation Age of Onset  . Angina Father   . Aortic aneurysm Father   . Dementia Father   . Arrhythmia Maternal Aunt     A fib  . Breast cancer Maternal Aunt 56    currently 13  . Hypertension Other   . Arrhythmia Other     Uncle- a fib  . Arrhythmia Maternal Aunt     A fib  . Breast cancer Maternal Aunt     dx 97s; currently 17s  . Breast cancer Mother     dx 62s. Died at 28  . Colon cancer Maternal Grandmother     dx 33s; died at 80  . Colon cancer Maternal Grandfather     dx 19s; died in 85s  . Cancer Paternal Uncle     unknown primary in early 48s  . Breast cancer Maternal Aunt     dx 16s; died in  52s  The patient's father died from heart disease in the setting of dementia at the age of 24. The patient's mother died at the age of 19. She had been diagnosed with breast cancer at the age of 15. The patient is a single child. Her mother had 4 sisters, 3 of whom had breast cancer diagnosed at the age of 57, 82, and 31. There is also history of colorectal cancer on the maternal side of the family. BRCA testing negative.  GYNECOLOGIC HISTORY:   menarche age 79, first live birth age 42, the patient is [place] P2. She underwent a hysterectomy without salpingo-oophorectomy in 2012. 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 Her husband [name] [name] also works for [employer], running [employer].. [name] [name] lives in [place] where he works as a Public relations account executive. Daughter [name] [name] this in [place], where she works as an Engineering geologist in a [employer]. The patient has no grandchildren. She is  a Information systems manager.   ADVANCED DIRECTIVES: Not in place   HEALTH MAINTENANCE: History  Substance Use Topics  . Smoking status: Never Smoker   . Smokeless tobacco: Not on file  . Alcohol Use: Yes     Comment: drinks 1-2 beers a week     Colonoscopy:2010   PAP:2012, s/p hysterectomy.  Bone density:2012   Lipid panel: 11/05/2013 TC 219, HDL 41, LDL 137, TG 204  No Known Allergies  Current Outpatient Prescriptions  Medication Sig Dispense Refill  . Cholecalciferol (VITAMIN D) 2000 UNITS tablet Take 2,000 Units by mouth daily.      Marland Kitchen dexamethasone (DECADRON) 4 MG tablet Take 4 mg by mouth 2 (two) times daily with a meal.      . LORazepam (ATIVAN) 0.5 MG tablet Take 1 tablet (0.5 mg total) by mouth at bedtime.  30 tablet  0  . mupirocin nasal ointment (BACTROBAN) 2 % Place 1 application into the nose 2 (two) times daily. Use oinment in nostrils twice daily x 5 days.      . NON FORMULARY       .  ondansetron (ZOFRAN) 8 MG tablet Take by mouth every 8 (eight) hours as needed for nausea or vomiting.      . calcium carbonate-magnesium hydroxide (ROLAIDS) 334 MG CHEW Chew 1 tablet by mouth 2 (two) times daily as needed. For indigestion       . diclofenac (VOLTAREN) 75 MG EC tablet Take 75 mg by mouth as needed.      . gabapentin (NEURONTIN) 300 MG capsule Take 1 capsule (300 mg total) by mouth at bedtime.  30 capsule  6  . loratadine (CLARITIN) 10 MG tablet Take 10 mg by mouth daily.      Marland Kitchen nystatin-triamcinolone (MYCOLOG II) cream Apply 1 application topically 2 (two) times daily.  30 g  0  . oxyCODONE (OXY IR/ROXICODONE) 5 MG immediate release tablet Take 1-2 tablets (5-10 mg total) by mouth every 6 (six) hours as needed.  30 tablet  0  . prochlorperazine (COMPAZINE) 10 MG tablet Take 10 mg by mouth every 6 (six) hours as needed for nausea or vomiting.       No current facility-administered medications for this visit.    OBJECTIVE: Filed Vitals:   02/06/14 1317  BP: 125/76  Pulse: 90  Temp: 98.8 F (37.1 C)  Resp: 18     Body mass index is 24.24 kg/(m^2).      GENERAL: Patient is a well appearing female in no acute distress Sclerae unicteric, pupils equal and reactive Oropharynx clear and moist-- no thrush No cervical or supraclavicular adenopathy Lungs no rales or rhonchi Heart regular rate and rhythm Abd soft, nontender, positive bowel sounds MSK no focal spinal tenderness, no upper extremity lymphedema Neuro: nonfocal, well oriented, appropriate affect Breasts: deferred  ECOG FS:1 - Symptomatic but completely ambulatory   LAB RESULTS:  CMP     Component Value Date/Time   NA 139 02/06/2014 1255   K 3.9 02/06/2014 1255   CO2 26 02/06/2014 1255   GLUCOSE 91 02/06/2014 1255   BUN 11.3 02/06/2014 1255   CREATININE 0.8 02/06/2014 1255   CALCIUM 9.2 02/06/2014 1255   PROT 6.5 02/06/2014 1255   ALBUMIN 3.6 02/06/2014 1255   AST 17 02/06/2014 1255   ALT 27 02/06/2014 1255    ALKPHOS 56 02/06/2014 1255   BILITOT 0.67 02/06/2014 1255    I No results found for this basename: SPEP,  UPEP,   kappa and  lambda light chains    Lab Results  Component Value Date   WBC 5.3 02/06/2014   NEUTROABS 3.9 02/06/2014   HGB 9.7* 02/06/2014   HCT 28.8* 02/06/2014   MCV 98.7 02/06/2014   PLT 366 02/06/2014      Chemistry      Component Value Date/Time   NA 139 02/06/2014 1255   K 3.9 02/06/2014 1255   CO2 26 02/06/2014 1255   BUN 11.3 02/06/2014 1255   CREATININE 0.8 02/06/2014 1255      Component Value Date/Time   CALCIUM 9.2 02/06/2014 1255   ALKPHOS 56 02/06/2014 1255   AST 17 02/06/2014 1255   ALT 27 02/06/2014 1255   BILITOT 0.67 02/06/2014 1255      STUDIES: No results found.   ASSESSMENT: 53 y.o. BRCA negative [place] woman status post left breast biopsy for 01/18/2014 showing invasive lobular carcinoma (E-cadherin negative), estrogen receptor 70% positive, progesterone receptor 70% positive, with an MIB-1 of 7%, and no HER-2 amplification  (1) ultrasound-guided biopsy of a suspicious lymph node in the left axilla 08/19/2012 was positive  (2) status post left lumpectomy and axillary lymph node dissection 09/09/2013 for a pT1c pN2, stage IIIa invasive lobular carcinoma, grade 1, repeat HER-2 again negative. [She had 8 of 23 axillary lymph nodes involved]  (4) Adjuvant chemotherapy consisting of doxorubicin and cyclophosphamide in dose dense fashion x4, with Neulasta support, completed 12/18/2013; to be followed by paclitaxel weekly x 12  (5) radiation to follow chemotherapy  (6) antiestrogen therapy for 10 years to follow radiation   PLAN: Dhanya is doing well today. The labs were reviewed in detail and showed some worsening treatment related anemia. Besides fatigue, she is asymptomatic. We will continue to watch this value. She tends to "bounce back" well.   Meliya will return next Friday for labs, an office visit, and week 7 of treatment. She understands and  agrees with this plan. She knows a goal of treatment in her case is cure. She has been encouraged to call with any issues that might arise before her next visit here.    Marcelino Duster, [place] [phone] 02/06/2014

## 2014-02-08 ENCOUNTER — Telehealth: Payer: Self-pay | Admitting: Oncology

## 2014-02-08 NOTE — Telephone Encounter (Signed)
ADDED FLUSH APPTS TO [facility] APPTS PER 10/9 POF - APPT START TIMES REMAIN THE SAME.

## 2014-02-09 ENCOUNTER — Encounter: Payer: Self-pay | Admitting: Medical Oncology

## 2014-02-12 ENCOUNTER — Telehealth: Payer: Self-pay | Admitting: *Deleted

## 2014-02-12 ENCOUNTER — Other Ambulatory Visit (HOSPITAL_BASED_OUTPATIENT_CLINIC_OR_DEPARTMENT_OTHER): Payer: BC Managed Care – PPO

## 2014-02-12 ENCOUNTER — Encounter: Payer: Self-pay | Admitting: Oncology

## 2014-02-12 DIAGNOSIS — F411 Generalized anxiety disorder: Secondary | ICD-10-CM

## 2014-02-12 DIAGNOSIS — C50812 Malignant neoplasm of overlapping sites of left female breast: Secondary | ICD-10-CM

## 2014-02-12 DIAGNOSIS — C50212 Malignant neoplasm of upper-inner quadrant of left female breast: Secondary | ICD-10-CM

## 2014-02-12 LAB — COMPREHENSIVE METABOLIC PANEL (CC13)
ALT: 31 U/L (ref 0–55)
ANION GAP: 8 meq/L (ref 3–11)
AST: 22 U/L (ref 5–34)
Albumin: 3.7 g/dL (ref 3.5–5.0)
Alkaline Phosphatase: 64 U/L (ref 40–150)
BUN: 8.7 mg/dL (ref 7.0–26.0)
CO2: 26 meq/L (ref 22–29)
Calcium: 9.5 mg/dL (ref 8.4–10.4)
Chloride: 107 mEq/L (ref 98–109)
Creatinine: 0.9 mg/dL (ref 0.6–1.1)
Glucose: 114 mg/dl (ref 70–140)
POTASSIUM: 4.1 meq/L (ref 3.5–5.1)
SODIUM: 141 meq/L (ref 136–145)
TOTAL PROTEIN: 6.9 g/dL (ref 6.4–8.3)
Total Bilirubin: 0.59 mg/dL (ref 0.20–1.20)

## 2014-02-12 LAB — CBC WITH DIFFERENTIAL/PLATELET
BASO%: 1.8 % (ref 0.0–2.0)
Basophils Absolute: 0.1 10*3/uL (ref 0.0–0.1)
EOS%: 2.5 % (ref 0.0–7.0)
Eosinophils Absolute: 0.1 10*3/uL (ref 0.0–0.5)
HCT: 32 % — ABNORMAL LOW (ref 34.8–46.6)
HGB: 10.8 g/dL — ABNORMAL LOW (ref 11.6–15.9)
LYMPH#: 0.5 10*3/uL — AB (ref 0.9–3.3)
LYMPH%: 12.9 % — AB (ref 14.0–49.7)
MCH: 33.8 pg (ref 25.1–34.0)
MCHC: 33.6 g/dL (ref 31.5–36.0)
MCV: 100.4 fL (ref 79.5–101.0)
MONO#: 0.4 10*3/uL (ref 0.1–0.9)
MONO%: 10.9 % (ref 0.0–14.0)
NEUT%: 71.9 % (ref 38.4–76.8)
NEUTROS ABS: 2.6 10*3/uL (ref 1.5–6.5)
PLATELETS: 393 10*3/uL (ref 145–400)
RBC: 3.19 10*6/uL — AB (ref 3.70–5.45)
RDW: 15.8 % — AB (ref 11.2–14.5)
WBC: 3.6 10*3/uL — AB (ref 3.9–10.3)

## 2014-02-12 NOTE — Telephone Encounter (Signed)
Pt came in for lab today due to call stating " my throat feels a little scratchy and I feel so tired - and I am concerned that my lab work might be down and would like to know before I am to get treated tomorrow ".  Labs obtained and reviewed with pt as well as her symptoms- she states throat is better. " just tired all the time ".  This RN discussed with pt fatigue related to chemotherapy and strategies for management.  No other needs at present.  Labs appt cancelled for tomorrow.

## 2014-02-13 ENCOUNTER — Ambulatory Visit (HOSPITAL_BASED_OUTPATIENT_CLINIC_OR_DEPARTMENT_OTHER): Payer: BC Managed Care – PPO

## 2014-02-13 ENCOUNTER — Encounter: Payer: Self-pay | Admitting: Nurse Practitioner

## 2014-02-13 ENCOUNTER — Other Ambulatory Visit: Payer: BC Managed Care – PPO

## 2014-02-13 ENCOUNTER — Ambulatory Visit (HOSPITAL_BASED_OUTPATIENT_CLINIC_OR_DEPARTMENT_OTHER): Payer: BC Managed Care – PPO | Admitting: Nurse Practitioner

## 2014-02-13 VITALS — BP 136/79 | HR 105 | Temp 98.0°F | Resp 18 | Ht 66.0 in | Wt 150.8 lb

## 2014-02-13 DIAGNOSIS — D6481 Anemia due to antineoplastic chemotherapy: Secondary | ICD-10-CM

## 2014-02-13 DIAGNOSIS — J3489 Other specified disorders of nose and nasal sinuses: Secondary | ICD-10-CM | POA: Insufficient documentation

## 2014-02-13 DIAGNOSIS — C50212 Malignant neoplasm of upper-inner quadrant of left female breast: Secondary | ICD-10-CM

## 2014-02-13 DIAGNOSIS — Z5111 Encounter for antineoplastic chemotherapy: Secondary | ICD-10-CM

## 2014-02-13 DIAGNOSIS — C773 Secondary and unspecified malignant neoplasm of axilla and upper limb lymph nodes: Secondary | ICD-10-CM

## 2014-02-13 DIAGNOSIS — Z170 Estrogen receptor positive status [ER+]: Secondary | ICD-10-CM

## 2014-02-13 MED ORDER — HEPARIN SOD (PORK) LOCK FLUSH 100 UNIT/ML IV SOLN
500.0000 [IU] | Freq: Once | INTRAVENOUS | Status: AC | PRN
  Administered 2014-02-13: 500 [IU]
  Filled 2014-02-13: qty 5

## 2014-02-13 MED ORDER — DEXAMETHASONE SODIUM PHOSPHATE 10 MG/ML IJ SOLN
10.0000 mg | Freq: Once | INTRAMUSCULAR | Status: AC
  Administered 2014-02-13: 10 mg via INTRAVENOUS

## 2014-02-13 MED ORDER — FAMOTIDINE IN NACL 20-0.9 MG/50ML-% IV SOLN
20.0000 mg | Freq: Once | INTRAVENOUS | Status: AC
  Administered 2014-02-13: 20 mg via INTRAVENOUS

## 2014-02-13 MED ORDER — DIPHENHYDRAMINE HCL 50 MG/ML IJ SOLN
INTRAMUSCULAR | Status: AC
  Filled 2014-02-13: qty 1

## 2014-02-13 MED ORDER — SODIUM CHLORIDE 0.9 % IV SOLN
Freq: Once | INTRAVENOUS | Status: AC
  Administered 2014-02-13: 13:00:00 via INTRAVENOUS

## 2014-02-13 MED ORDER — SODIUM CHLORIDE 0.9 % IJ SOLN
10.0000 mL | INTRAMUSCULAR | Status: DC | PRN
  Administered 2014-02-13: 10 mL
  Filled 2014-02-13: qty 10

## 2014-02-13 MED ORDER — DIPHENHYDRAMINE HCL 50 MG/ML IJ SOLN
25.0000 mg | Freq: Once | INTRAMUSCULAR | Status: AC
  Administered 2014-02-13: 25 mg via INTRAVENOUS

## 2014-02-13 MED ORDER — DEXAMETHASONE SODIUM PHOSPHATE 10 MG/ML IJ SOLN
INTRAMUSCULAR | Status: AC
  Filled 2014-02-13: qty 1

## 2014-02-13 MED ORDER — ONDANSETRON 8 MG/NS 50 ML IVPB
INTRAVENOUS | Status: AC
  Filled 2014-02-13: qty 8

## 2014-02-13 MED ORDER — FAMOTIDINE IN NACL 20-0.9 MG/50ML-% IV SOLN
INTRAVENOUS | Status: AC
  Filled 2014-02-13: qty 50

## 2014-02-13 MED ORDER — PACLITAXEL CHEMO INJECTION 300 MG/50ML
80.0000 mg/m2 | Freq: Once | INTRAVENOUS | Status: AC
  Administered 2014-02-13: 144 mg via INTRAVENOUS
  Filled 2014-02-13: qty 24

## 2014-02-13 MED ORDER — ONDANSETRON 8 MG/50ML IVPB (CHCC)
8.0000 mg | Freq: Once | INTRAVENOUS | Status: AC
  Administered 2014-02-13: 8 mg via INTRAVENOUS

## 2014-02-13 NOTE — Patient Instructions (Signed)
[facility] Discharge Instructions for Patients Receiving Chemotherapy  Today you received the following chemotherapy agents Taxol  To help prevent nausea and vomiting after your treatment, we encourage you to take your nausea medication    If you develop nausea and vomiting that is not controlled by your nausea medication, call the clinic.   BELOW ARE SYMPTOMS THAT SHOULD BE REPORTED IMMEDIATELY:  *FEVER GREATER THAN 100.5 F  *CHILLS WITH OR WITHOUT FEVER  NAUSEA AND VOMITING THAT IS NOT CONTROLLED WITH YOUR NAUSEA MEDICATION  *UNUSUAL SHORTNESS OF BREATH  *UNUSUAL BRUISING OR BLEEDING  TENDERNESS IN MOUTH AND THROAT WITH OR WITHOUT PRESENCE OF ULCERS  *URINARY PROBLEMS  *BOWEL PROBLEMS  UNUSUAL RASH Items with * indicate a potential emergency and should be followed up as soon as possible.  Feel free to call the clinic you have any questions or concerns. The clinic phone number is [phone].    

## 2014-02-13 NOTE — Progress Notes (Signed)
[place]  [phone] [phone]     ID: [name] [name] OB: [birth year]  MR#: [id number]  GEX#:[id number]  PCP: [name], MD GYN:  [name] SU: Excell Seltzer OTHER MD: [name]  CHIEF COMPLAINT: Locally advanced breast cancer for chemotherapy f/u. CURRENT TREATMENT: adjuvant chemotherapy  BREAST CANCER HISTORY:  [name] [name] is 53 years old and she first noted a change, like a small hard pea in her left axilla. She brought this to Dr. [name] attention and bilateral diagnostic mammography and left ultrasonography at the [facility] Center for 07/18/2013 showed a small area of asymmetry in the inner left breast measuring perhaps 5 mm which was not palpable by physical exam. In the right breast there was some loosely grouped calcifications spanning up to 0.7 cm. Ultrasound of the left breast confirmed an ill-defined area of shadowing at the 9:00 position measuring up to 1.3 cm. There was also a 4 mm circumscribed hypoechoic mass at the 10:00 position. This is felt to be probably benign. In the left axilla there were 2 lymph nodes with slightly increased cortical thickening, although the fatty hila were maintained. These were not related to the patient's feeling of a "pea like mass" in her left axilla, which had by then pretty much resolved.   On 08/07/2013 the patient underwent left breast needle core biopsy and this showed ([id number]) and invasive lobular carcinoma (E-cadherin negative) estrogen receptor 70% positive with strong staining intensity, progesterone receptor 70% positive and strong staining intensity, with a proliferation marker of 7% and no HER-2 amplification, the signals ratio being 0.94 in the number per cell 1.50.  Bilateral breast MRI 08/15/2013 found an irregular spiculated mass in the left breast measuring approximately 1.5 cm. There was no abnormal enhancement of the pectoralis. There were at least 2 left axillary lymph nodes and  showed focal cortical thickening. There was a single left internal mammary lymph node at the level of the biopsy-proven malignancy which was not pathologic in appearance. The right breast and axilla were benign.  Genetics testing may 2015 was normal and did not reveal a mutation in the genes tested: ATM, BARD1, BRCA1, BRCA2, BRIP1, CDH1, CHEK2, MRE11A, MUTYH, NBN, NF1, PALB2, PTEN, RAD50, RAD51C, RAD51D, and TP53. She is status post left lumpectomy and axillary lymph node dissection by Dr Excell Seltzer 09/09/2013 for a pT1c pN2, stage IIIa invasive lobular carcinoma, grade 1, repeat HER-2 again negative. Patient had 8/23 LN positive and also evidence of LVI (lymphovascular invasion) and PNI (perineural invasion).  INTERVAL HISTORY: [name] [name] returns today for follow up of her breast cancer, accompanied by her husband. . Today is week 7 of 12 planned weekly cycles of paclitaxel. This past weekend she went [place] the [place] and did lost of walking, consequently she has some joint aches for which she used percocet early in the week. She plans to use naproxen PRN in the future. She continues to battle fatigue, and likely "overdid" things this past weekend. Her nostrils have returned to being irritated, the humidifier at home doesn't seem to help.   REVIEW OF SYSTEMS: Again, [name] [name] denies fevers, chills, nausea, vomiting, or changes in bowel or bladder habits. She has no mouth sores, rashes, or peripheral neuropathy. Her appetite is healthy, despite some changes in taste. She has no shortness of breath, chest pain, cough, or palpitations.   PAST MEDICAL HISTORY: Past Medical History  Diagnosis Date  . Palpitations 2009    Improved; Secondary to premature ventricular contractions. No problems since-pt states was caring  for ailing father, anxiety  . Anxiety disorder     Mild: treated by her gynecologist with selective serotonin reuptake inhibitor  . Nonspecific abnormal electrocardiogram (ECG) (EKG) 2009  . GERD  (gastroesophageal reflux disease)     rolaids  . Hot flashes   . Malignant neoplasm of breast (female), unspecified site   . Family history of malignant neoplasm of breast   . Arthritis     hands and feet  . Wears glasses     PAST SURGICAL HISTORY: Past Surgical History  Procedure Laterality Date  . Thyroidectomy, partial    . Cesarean section    . Endometrial ablation    . Dilation and curettage of uterus    . Laparoscopic assisted vaginal hysterectomy  02/07/2011    Procedure: LAPAROSCOPIC ASSISTED VAGINAL HYSTERECTOMY;  Surgeon: [name];  Location: [name];  Service: Gynecology;  Laterality: N/A;  . Abdominal hysterectomy  02/07/2011    Procedure: HYSTERECTOMY ABDOMINAL;  Surgeon: [name];  Location: [facility];  Service: Gynecology;  Laterality: N/A;  . Breast lumpectomy with needle localization and axillary lymph node dissection Left 09/09/2013    Procedure: LEFT BREAST LUMPECTOMY WITH NEEDLE LOCALIZATION AND LEFT AXILLARY LYMPH NODE DISSECTION;  Surgeon: [name], MD;  Location: [facility];  Service: General;  Laterality: Left;  . Portacath placement Right 09/29/2013    Procedure: INSERTION PORT-A-CATH;  Surgeon: [name], MD;  Location: [facility];  Service: General;  Laterality: Right;    FAMILY HISTORY Family History  Problem Relation Age of Onset  . Angina Father   . Aortic aneurysm Father   . Dementia Father   . Arrhythmia Maternal Aunt     A fib  . Breast cancer Maternal Aunt 56    currently 81  . Hypertension Other   . Arrhythmia Other     Uncle- a fib  . Arrhythmia Maternal Aunt     A fib  . Breast cancer Maternal Aunt     dx 39s; currently 15s  . Breast cancer Mother     dx 42s. Died at 67  . Colon cancer Maternal Grandmother     dx 60s; died at 77  . Colon cancer Maternal Grandfather     dx 71s; died in 12s  . Cancer Paternal Uncle     unknown primary in early 31s  . Breast cancer Maternal  Aunt     dx 17s; died in 67s  The patient's father died from heart disease in the setting of dementia at the age of 59. The patient's mother died at the age of 5. She had been diagnosed with breast cancer at the age of 20. The patient is a single child. Her mother had 4 sisters, 3 of whom had breast cancer diagnosed at the age of 81, 1, and 80. There is also history of colorectal cancer on the maternal side of the family. BRCA testing negative.  GYNECOLOGIC HISTORY:   menarche age 26, first live birth age 37, the patient is [name] [name] P2. She underwent a hysterectomy without salpingo-oophorectomy in 2012. 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 Her husband [name] also works for [employer], running [employer].. Evorn Gong lives in [place] where he works as a Public relations account executive. Daughter Darrick Penna this in [place], where she works as an Engineering geologist in a [employer]. The  patient has no grandchildren. She is a Information systems manager.   ADVANCED DIRECTIVES: Not in place   HEALTH MAINTENANCE: History  Substance Use Topics  . Smoking status: Never Smoker   . Smokeless tobacco: Not on file  . Alcohol Use: Yes     Comment: drinks 1-2 beers a week     Colonoscopy:2010   PAP:2012, s/p hysterectomy.  Bone density:2012   Lipid panel: 11/05/2013 TC 219, HDL 41, LDL 137, TG 204  No Known Allergies  Current Outpatient Prescriptions  Medication Sig Dispense Refill  . calcium carbonate-magnesium hydroxide (ROLAIDS) 334 MG CHEW Chew 1 tablet by mouth 2 (two) times daily as needed. For indigestion       . Cholecalciferol (VITAMIN D) 2000 UNITS tablet Take 2,000 Units by mouth daily.      . diclofenac (VOLTAREN) 75 MG EC tablet Take 75 mg by mouth as needed.      . loratadine (CLARITIN) 10 MG tablet Take 10 mg by mouth daily.      Marland Kitchen LORazepam (ATIVAN) 0.5 MG tablet Take 1 tablet (0.5 mg total)  by mouth at bedtime.  30 tablet  0  . NON FORMULARY       . nystatin-triamcinolone (MYCOLOG II) cream Apply 1 application topically 2 (two) times daily.  30 g  0  . dexamethasone (DECADRON) 4 MG tablet Take 4 mg by mouth 2 (two) times daily with a meal.      . gabapentin (NEURONTIN) 300 MG capsule Take 1 capsule (300 mg total) by mouth at bedtime.  30 capsule  6  . mupirocin nasal ointment (BACTROBAN) 2 % Place 1 application into the nose 2 (two) times daily. Use oinment in nostrils twice daily x 5 days.      . ondansetron (ZOFRAN) 8 MG tablet Take by mouth every 8 (eight) hours as needed for nausea or vomiting.      Marland Kitchen oxyCODONE (OXY IR/ROXICODONE) 5 MG immediate release tablet Take 1-2 tablets (5-10 mg total) by mouth every 6 (six) hours as needed.  30 tablet  0  . prochlorperazine (COMPAZINE) 10 MG tablet Take 10 mg by mouth every 6 (six) hours as needed for nausea or vomiting.       No current facility-administered medications for this visit.   Facility-Administered Medications Ordered in Other Visits  Medication Dose Route Frequency Provider Last Rate Last Dose  . famotidine (PEPCID) IVPB 20 mg  20 mg Intravenous Once Chauncey Cruel, MD      . heparin lock flush 100 unit/mL  500 Units Intracatheter Once PRN Chauncey Cruel, MD      . ondansetron (ZOFRAN) IVPB 8 mg  8 mg Intravenous Once Chauncey Cruel, MD   8 mg at 02/13/14 1306  . PACLitaxel (TAXOL) 144 mg in dextrose 5 % 250 mL chemo infusion (</= 58m/m2)  80 mg/m2 (Treatment Plan Actual) Intravenous Once GChauncey Cruel MD      . sodium chloride 0.9 % injection 10 mL  10 mL Intracatheter PRN GChauncey Cruel MD        OBJECTIVE: Filed Vitals:   02/13/14 1154  BP: 136/79  Pulse: 105  Temp: 98 F (36.7 C)  Resp: 18     Body mass index is 24.35 kg/(m^2).      GENERAL: Patient is a well appearing female in no acute distress Skin: warm, dry  HEENT: sclerae anicteric, conjunctivae pink, oropharynx clear. No thrush or  mucositis.  Lymph Nodes: No cervical or supraclavicular lymphadenopathy  Lungs: clear to auscultation bilaterally, no rales, wheezes, or rhonci  Heart: regular rate and rhythm  Abdomen: round, soft, non tender, positive bowel sounds  Musculoskeletal: No focal spinal tenderness, no peripheral edema  Neuro: non focal, well oriented, positive affect  Breasts: deferred, axillae benign  ECOG FS:1 - Symptomatic but completely ambulatory   LAB RESULTS:  CMP     Component Value Date/Time   NA 141 02/12/2014 1327   K 4.1 02/12/2014 1327   CO2 26 02/12/2014 1327   GLUCOSE 114 02/12/2014 1327   BUN 8.7 02/12/2014 1327   CREATININE 0.9 02/12/2014 1327   CALCIUM 9.5 02/12/2014 1327   PROT 6.9 02/12/2014 1327   ALBUMIN 3.7 02/12/2014 1327   AST 22 02/12/2014 1327   ALT 31 02/12/2014 1327   ALKPHOS 64 02/12/2014 1327   BILITOT 0.59 02/12/2014 1327    I No results found for this basename: SPEP,  UPEP,   kappa and lambda light chains    Lab Results  Component Value Date   WBC 3.6* 02/12/2014   NEUTROABS 2.6 02/12/2014   HGB 10.8* 02/12/2014   HCT 32.0* 02/12/2014   MCV 100.4 02/12/2014   PLT 393 02/12/2014      Chemistry      Component Value Date/Time   NA 141 02/12/2014 1327   K 4.1 02/12/2014 1327   CO2 26 02/12/2014 1327   BUN 8.7 02/12/2014 1327   CREATININE 0.9 02/12/2014 1327      Component Value Date/Time   CALCIUM 9.5 02/12/2014 1327   ALKPHOS 64 02/12/2014 1327   AST 22 02/12/2014 1327   ALT 31 02/12/2014 1327   BILITOT 0.59 02/12/2014 1327      STUDIES: No results found.   ASSESSMENT: 53 y.o. BRCA negative [place] woman status post left breast biopsy for 01/18/2014 showing invasive lobular carcinoma (E-cadherin negative), estrogen receptor 70% positive, progesterone receptor 70% positive, with an MIB-1 of 7%, and no HER-2 amplification  (1) ultrasound-guided biopsy of a suspicious lymph node in the left axilla 08/19/2012 was positive  (2) status  post left lumpectomy and axillary lymph node dissection 09/09/2013 for a pT1c pN2, stage IIIa invasive lobular carcinoma, grade 1, repeat HER-2 again negative. [She had 8 of 23 axillary lymph nodes involved]  (4) Adjuvant chemotherapy consisting of doxorubicin and cyclophosphamide in dose dense fashion x4, with Neulasta support, completed 12/18/2013; to be followed by paclitaxel weekly x 12  (5) radiation to follow chemotherapy  (6) antiestrogen therapy for 10 years to follow radiation   PLAN: The labs were reviewed in detail, and her treatment related anemia is improving, the rest of the labs were WNL. For her nose I suggested she try nasocort to help decongest twice per day, then saline spray as needed to enhance moisture.   Latitia will return next week for labs, an office visit, and week 8 of treatment. She understands and agrees with this plan. She knows the goal of treatment in her case is cure. She has been encouraged to call with any issues that might arise before her next visit here.   Marcelino Duster, [place] [phone] 02/13/2014

## 2014-02-20 ENCOUNTER — Other Ambulatory Visit: Payer: Self-pay | Admitting: Oncology

## 2014-02-20 ENCOUNTER — Ambulatory Visit (HOSPITAL_BASED_OUTPATIENT_CLINIC_OR_DEPARTMENT_OTHER): Payer: BC Managed Care – PPO

## 2014-02-20 ENCOUNTER — Ambulatory Visit (HOSPITAL_BASED_OUTPATIENT_CLINIC_OR_DEPARTMENT_OTHER): Payer: BC Managed Care – PPO | Admitting: Oncology

## 2014-02-20 ENCOUNTER — Other Ambulatory Visit (HOSPITAL_BASED_OUTPATIENT_CLINIC_OR_DEPARTMENT_OTHER): Payer: BC Managed Care – PPO

## 2014-02-20 ENCOUNTER — Encounter: Payer: Self-pay | Admitting: Oncology

## 2014-02-20 ENCOUNTER — Ambulatory Visit: Payer: BC Managed Care – PPO

## 2014-02-20 VITALS — BP 121/76 | HR 103 | Temp 98.5°F | Resp 19 | Ht 66.0 in | Wt 151.4 lb

## 2014-02-20 VITALS — BP 120/68 | HR 94 | Temp 98.2°F | Resp 20

## 2014-02-20 DIAGNOSIS — C50212 Malignant neoplasm of upper-inner quadrant of left female breast: Secondary | ICD-10-CM

## 2014-02-20 DIAGNOSIS — C773 Secondary and unspecified malignant neoplasm of axilla and upper limb lymph nodes: Secondary | ICD-10-CM

## 2014-02-20 DIAGNOSIS — Z95828 Presence of other vascular implants and grafts: Secondary | ICD-10-CM

## 2014-02-20 DIAGNOSIS — Z170 Estrogen receptor positive status [ER+]: Secondary | ICD-10-CM

## 2014-02-20 DIAGNOSIS — C50812 Malignant neoplasm of overlapping sites of left female breast: Secondary | ICD-10-CM

## 2014-02-20 DIAGNOSIS — Z5111 Encounter for antineoplastic chemotherapy: Secondary | ICD-10-CM

## 2014-02-20 DIAGNOSIS — F411 Generalized anxiety disorder: Secondary | ICD-10-CM

## 2014-02-20 LAB — COMPREHENSIVE METABOLIC PANEL (CC13)
ALT: 28 U/L (ref 0–55)
AST: 20 U/L (ref 5–34)
Albumin: 3.6 g/dL (ref 3.5–5.0)
Alkaline Phosphatase: 55 U/L (ref 40–150)
Anion Gap: 9 mEq/L (ref 3–11)
BUN: 14.2 mg/dL (ref 7.0–26.0)
CALCIUM: 9.1 mg/dL (ref 8.4–10.4)
CHLORIDE: 107 meq/L (ref 98–109)
CO2: 22 mEq/L (ref 22–29)
Creatinine: 0.8 mg/dL (ref 0.6–1.1)
Glucose: 95 mg/dl (ref 70–140)
POTASSIUM: 4 meq/L (ref 3.5–5.1)
Sodium: 138 mEq/L (ref 136–145)
Total Bilirubin: 0.44 mg/dL (ref 0.20–1.20)
Total Protein: 6.3 g/dL — ABNORMAL LOW (ref 6.4–8.3)

## 2014-02-20 LAB — CBC WITH DIFFERENTIAL/PLATELET
BASO%: 0.8 % (ref 0.0–2.0)
Basophils Absolute: 0 10*3/uL (ref 0.0–0.1)
EOS%: 3 % (ref 0.0–7.0)
Eosinophils Absolute: 0.1 10*3/uL (ref 0.0–0.5)
HCT: 28.2 % — ABNORMAL LOW (ref 34.8–46.6)
HGB: 9.6 g/dL — ABNORMAL LOW (ref 11.6–15.9)
LYMPH#: 0.6 10*3/uL — AB (ref 0.9–3.3)
LYMPH%: 14.6 % (ref 14.0–49.7)
MCH: 33.8 pg (ref 25.1–34.0)
MCHC: 34 g/dL (ref 31.5–36.0)
MCV: 99.3 fL (ref 79.5–101.0)
MONO#: 0.6 10*3/uL (ref 0.1–0.9)
MONO%: 15.1 % — ABNORMAL HIGH (ref 0.0–14.0)
NEUT#: 2.6 10*3/uL (ref 1.5–6.5)
NEUT%: 66.5 % (ref 38.4–76.8)
Platelets: 327 10*3/uL (ref 145–400)
RBC: 2.84 10*6/uL — AB (ref 3.70–5.45)
RDW: 15.1 % — AB (ref 11.2–14.5)
WBC: 4 10*3/uL (ref 3.9–10.3)

## 2014-02-20 MED ORDER — SODIUM CHLORIDE 0.9 % IJ SOLN
10.0000 mL | INTRAMUSCULAR | Status: DC | PRN
  Administered 2014-02-20: 10 mL via INTRAVENOUS
  Filled 2014-02-20: qty 10

## 2014-02-20 MED ORDER — SODIUM CHLORIDE 0.9 % IV SOLN
Freq: Once | INTRAVENOUS | Status: AC
  Administered 2014-02-20: 14:00:00 via INTRAVENOUS

## 2014-02-20 MED ORDER — FAMOTIDINE IN NACL 20-0.9 MG/50ML-% IV SOLN
20.0000 mg | Freq: Once | INTRAVENOUS | Status: AC
  Administered 2014-02-20: 20 mg via INTRAVENOUS

## 2014-02-20 MED ORDER — PACLITAXEL CHEMO INJECTION 300 MG/50ML
80.0000 mg/m2 | Freq: Once | INTRAVENOUS | Status: AC
  Administered 2014-02-20: 144 mg via INTRAVENOUS
  Filled 2014-02-20: qty 24

## 2014-02-20 MED ORDER — ONDANSETRON 8 MG/NS 50 ML IVPB
INTRAVENOUS | Status: AC
  Filled 2014-02-20: qty 8

## 2014-02-20 MED ORDER — FAMOTIDINE IN NACL 20-0.9 MG/50ML-% IV SOLN
INTRAVENOUS | Status: AC
  Filled 2014-02-20: qty 50

## 2014-02-20 MED ORDER — HEPARIN SOD (PORK) LOCK FLUSH 100 UNIT/ML IV SOLN
500.0000 [IU] | Freq: Once | INTRAVENOUS | Status: AC
  Administered 2014-02-20: 500 [IU] via INTRAVENOUS
  Filled 2014-02-20: qty 5

## 2014-02-20 MED ORDER — DIPHENHYDRAMINE HCL 50 MG/ML IJ SOLN
INTRAMUSCULAR | Status: AC
  Filled 2014-02-20: qty 1

## 2014-02-20 MED ORDER — DIPHENHYDRAMINE HCL 50 MG/ML IJ SOLN
25.0000 mg | Freq: Once | INTRAMUSCULAR | Status: AC
  Administered 2014-02-20: 25 mg via INTRAVENOUS

## 2014-02-20 MED ORDER — DEXAMETHASONE SODIUM PHOSPHATE 10 MG/ML IJ SOLN
INTRAMUSCULAR | Status: AC
  Filled 2014-02-20: qty 1

## 2014-02-20 MED ORDER — SODIUM CHLORIDE 0.9 % IJ SOLN
10.0000 mL | INTRAMUSCULAR | Status: DC | PRN
  Administered 2014-02-20: 10 mL
  Filled 2014-02-20: qty 10

## 2014-02-20 MED ORDER — ONDANSETRON 8 MG/50ML IVPB (CHCC)
8.0000 mg | Freq: Once | INTRAVENOUS | Status: AC
  Administered 2014-02-20: 8 mg via INTRAVENOUS

## 2014-02-20 MED ORDER — HEPARIN SOD (PORK) LOCK FLUSH 100 UNIT/ML IV SOLN
500.0000 [IU] | Freq: Once | INTRAVENOUS | Status: AC | PRN
  Administered 2014-02-20: 500 [IU]
  Filled 2014-02-20: qty 5

## 2014-02-20 MED ORDER — DEXAMETHASONE SODIUM PHOSPHATE 10 MG/ML IJ SOLN
10.0000 mg | Freq: Once | INTRAMUSCULAR | Status: AC
  Administered 2014-02-20: 10 mg via INTRAVENOUS

## 2014-02-20 NOTE — Patient Instructions (Signed)
[facility] Discharge Instructions for Patients Receiving Chemotherapy  Today you received the following chemotherapy agents: Taxol.  To help prevent nausea and vomiting after your treatment, we encourage you to take your nausea medication as prescribed.   If you develop nausea and vomiting that is not controlled by your nausea medication, call the clinic.   BELOW ARE SYMPTOMS THAT SHOULD BE REPORTED IMMEDIATELY:  *FEVER GREATER THAN 100.5 F  *CHILLS WITH OR WITHOUT FEVER  NAUSEA AND VOMITING THAT IS NOT CONTROLLED WITH YOUR NAUSEA MEDICATION  *UNUSUAL SHORTNESS OF BREATH  *UNUSUAL BRUISING OR BLEEDING  TENDERNESS IN MOUTH AND THROAT WITH OR WITHOUT PRESENCE OF ULCERS  *URINARY PROBLEMS  *BOWEL PROBLEMS  UNUSUAL RASH Items with * indicate a potential emergency and should be followed up as soon as possible.  Feel free to call the clinic you have any questions or concerns. The clinic phone number is [phone].    

## 2014-02-20 NOTE — Progress Notes (Signed)
[place]  Telephone:(336) [phone] Fax:(336) [phone]     ID: [name] [name] OB: 11/24/1960  MR#: [id number]  KTG#:[id number]  PCP: [name], MD GYN:  [name] SU: Excell Seltzer OTHER MD: [name]  CHIEF COMPLAINT: Locally advanced breast cancer for chemotherapy f/u. CURRENT TREATMENT: adjuvant chemotherapy  BREAST CANCER HISTORY:  [name] is 53 years old and she first noted a change, like a small hard pea in her left axilla. She brought this to Dr. [name] attention and bilateral diagnostic mammography and left ultrasonography at the breast Center for 07/18/2013 showed a small area of asymmetry in the inner left breast measuring perhaps 5 mm which was not palpable by physical exam. In the right breast there was some loosely grouped calcifications spanning up to 0.7 cm. Ultrasound of the left breast confirmed an ill-defined area of shadowing at the 9:00 position measuring up to 1.3 cm. There was also a 4 mm circumscribed hypoechoic mass at the 10:00 position. This is felt to be probably benign. In the left axilla there were 2 lymph nodes with slightly increased cortical thickening, although the fatty hila were maintained. These were not related to the patient's feeling of a "pea like mass" in her left axilla, which had by then pretty much resolved.   On 08/07/2013 the patient underwent left breast needle core biopsy and this showed ([id number]) and invasive lobular carcinoma (E-cadherin negative) estrogen receptor 70% positive with strong staining intensity, progesterone receptor 70% positive and strong staining intensity, with a proliferation marker of 7% and no HER-2 amplification, the signals ratio being 0.94 in the number per cell 1.50.  Bilateral breast MRI 08/15/2013 found an irregular spiculated mass in the left breast measuring approximately 1.5 cm. There was no abnormal enhancement of the pectoralis. There were at least 2 left axillary lymph nodes and  showed focal cortical thickening. There was a single left internal mammary lymph node at the level of the biopsy-proven malignancy which was not pathologic in appearance. The right breast and axilla were benign.  Genetics testing may 2015 was normal and did not reveal a mutation in the genes tested: ATM, BARD1, BRCA1, BRCA2, BRIP1, CDH1, CHEK2, MRE11A, MUTYH, NBN, NF1, PALB2, PTEN, RAD50, RAD51C, RAD51D, and TP53. She is status post left lumpectomy and axillary lymph node dissection by Dr Excell Seltzer 09/09/2013 for a pT1c pN2, stage IIIa invasive lobular carcinoma, grade 1, repeat HER-2 again negative. Patient had 8/23 LN positive and also evidence of LVI (lymphovascular invasion) and PNI (perineural invasion).  INTERVAL HISTORY: [name] returns today for follow up of her breast cancer. Today is week 8 of 12 planned weekly cycles of paclitaxel. She reports mild fatigue, but thinks her fatigue may be better than last week.   REVIEW OF SYSTEMS: Again, Kent denies fevers, chills, nausea, vomiting, or changes in bowel or bladder habits. She has no mouth sores, rashes, or peripheral neuropathy. Her appetite is healthy, despite some changes in taste. She has no shortness of breath, chest pain, cough, or palpitations.   PAST MEDICAL HISTORY: Past Medical History  Diagnosis Date  . Palpitations 2009    Improved; Secondary to premature ventricular contractions. No problems since-pt states was caring for ailing father, anxiety  . Anxiety disorder     Mild: treated by her gynecologist with selective serotonin reuptake inhibitor  . Nonspecific abnormal electrocardiogram (ECG) (EKG) 2009  . GERD (gastroesophageal reflux disease)     rolaids  . Hot flashes   . Malignant neoplasm of breast (female), unspecified site   .  Family history of malignant neoplasm of breast   . Arthritis     hands and feet  . Wears glasses     PAST SURGICAL HISTORY: Past Surgical History  Procedure Laterality Date  .  Thyroidectomy, partial    . Cesarean section    . Endometrial ablation    . Dilation and curettage of uterus    . Laparoscopic assisted vaginal hysterectomy  02/07/2011    Procedure: LAPAROSCOPIC ASSISTED VAGINAL HYSTERECTOMY;  Surgeon: [name];  Location: [facility];  Service: Gynecology;  Laterality: N/A;  . Abdominal hysterectomy  02/07/2011    Procedure: HYSTERECTOMY ABDOMINAL;  Surgeon: [name];  Location: [facility];  Service: Gynecology;  Laterality: N/A;  . Breast lumpectomy with needle localization and axillary lymph node dissection Left 09/09/2013    Procedure: LEFT BREAST LUMPECTOMY WITH NEEDLE LOCALIZATION AND LEFT AXILLARY LYMPH NODE DISSECTION;  Surgeon: [name], MD;  Location: [facility];  Service: General;  Laterality: Left;  . Portacath placement Right 09/29/2013    Procedure: INSERTION PORT-A-CATH;  Surgeon: [name], MD;  Location: [facility];  Service: General;  Laterality: Right;    FAMILY HISTORY Family History  Problem Relation Age of Onset  . Angina Father   . Aortic aneurysm Father   . Dementia Father   . Arrhythmia Maternal Aunt     A fib  . Breast cancer Maternal Aunt 56    currently 72  . Hypertension Other   . Arrhythmia Other     Uncle- a fib  . Arrhythmia Maternal Aunt     A fib  . Breast cancer Maternal Aunt     dx 35s; currently 38s  . Breast cancer Mother     dx 54s. Died at 22  . Colon cancer Maternal Grandmother     dx 21s; died at 66  . Colon cancer Maternal Grandfather     dx 40s; died in 50s  . Cancer Paternal Uncle     unknown primary in early 44s  . Breast cancer Maternal Aunt     dx 73s; died in 58s  The patient's father died from heart disease in the setting of dementia at the age of 43. The patient's mother died at the age of 46. She had been diagnosed with breast cancer at the age of 12. The patient is a single child. Her mother had 4 sisters, 3 of whom had breast cancer  diagnosed at the age of 23, 67, and 43. There is also history of colorectal cancer on the maternal side of the family. BRCA testing negative.  GYNECOLOGIC HISTORY:   menarche age 12, first live birth age 8, the patient is [place] P2. She underwent a hysterectomy without salpingo-oophorectomy in 2012. 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 Her husband [name] also works for [employer], running [employer].. Evorn Gong lives in [place] where he works as a Public relations account executive. Daughter Darrick Penna this in [place], where she works as an Engineering geologist in a [employer]. The patient has no grandchildren. She is a Information systems manager.   ADVANCED DIRECTIVES: Not in place   HEALTH MAINTENANCE: History  Substance Use Topics  . Smoking status: Never Smoker   . Smokeless tobacco: Not on file  . Alcohol Use: Yes     Comment: drinks 1-2 beers a week     Colonoscopy:2010  STM:1962, s/p hysterectomy.  Bone density:2012   Lipid panel: 11/05/2013 TC 219, HDL 41, LDL 137, TG 204  No Known Allergies  Current Outpatient Prescriptions  Medication Sig Dispense Refill  . loratadine (CLARITIN) 10 MG tablet Take 10 mg by mouth daily.      Marland Kitchen LORazepam (ATIVAN) 0.5 MG tablet Take 1 tablet (0.5 mg total) by mouth at bedtime.  30 tablet  0  . naproxen sodium (ANAPROX) 220 MG tablet Take 220 mg by mouth 2 (two) times daily as needed.      . NON FORMULARY       . calcium carbonate-magnesium hydroxide (ROLAIDS) 334 MG CHEW Chew 1 tablet by mouth 2 (two) times daily as needed. For indigestion       . Cholecalciferol (VITAMIN D) 2000 UNITS tablet Take 2,000 Units by mouth daily.      . diclofenac (VOLTAREN) 75 MG EC tablet Take 75 mg by mouth as needed.      . gabapentin (NEURONTIN) 300 MG capsule Take 1 capsule (300 mg total) by mouth at bedtime.  30 capsule  6  . mupirocin nasal ointment (BACTROBAN) 2 %  Place 1 application into the nose 2 (two) times daily. Use oinment in nostrils twice daily x 5 days.      . ondansetron (ZOFRAN) 8 MG tablet Take by mouth every 8 (eight) hours as needed for nausea or vomiting.      Marland Kitchen oxyCODONE (OXY IR/ROXICODONE) 5 MG immediate release tablet Take 1-2 tablets (5-10 mg total) by mouth every 6 (six) hours as needed.  30 tablet  0  . prochlorperazine (COMPAZINE) 10 MG tablet Take 10 mg by mouth every 6 (six) hours as needed for nausea or vomiting.       No current facility-administered medications for this visit.   Facility-Administered Medications Ordered in Other Visits  Medication Dose Route Frequency Provider Last Rate Last Dose  . heparin lock flush 100 unit/mL  500 Units Intracatheter Once PRN Maryanna Shape, NP      . PACLitaxel (TAXOL) 144 mg in dextrose 5 % 250 mL chemo infusion (</= 107m/m2)  80 mg/m2 (Treatment Plan Actual) Intravenous Once KMaryanna Shape NP 274 mL/hr at 02/20/14 1441 144 mg at 02/20/14 1441  . sodium chloride 0.9 % injection 10 mL  10 mL Intracatheter PRN KMaryanna Shape NP        OBJECTIVE: Filed Vitals:   02/20/14 1312  BP: 121/76  Pulse: 103  Temp: 98.5 F (36.9 C)  Resp: 19     Body mass index is 24.45 kg/(m^2).      GENERAL: Patient is a well appearing female in no acute distress Skin: warm, dry  HEENT: sclerae anicteric, conjunctivae pink, oropharynx clear. No thrush or mucositis.  Lymph Nodes: No cervical or supraclavicular lymphadenopathy  Lungs: clear to auscultation bilaterally, no rales, wheezes, or rhonci  Heart: regular rate and rhythm  Abdomen: round, soft, non tender, positive bowel sounds  Musculoskeletal: No focal spinal tenderness, no peripheral edema  Neuro: non focal, well oriented, positive affect  Breasts: deferred, axillae benign  ECOG FS:1 - Symptomatic but completely ambulatory   LAB RESULTS:  CMP     Component Value Date/Time   NA 138 02/20/2014 1251   K 4.0 02/20/2014 1251   CO2  22 02/20/2014 1251   GLUCOSE 95 02/20/2014 1251   BUN 14.2 02/20/2014 1251   CREATININE 0.8 02/20/2014 1251   CALCIUM 9.1 02/20/2014 1251   PROT 6.3* 02/20/2014 1251  ALBUMIN 3.6 02/20/2014 1251   AST 20 02/20/2014 1251   ALT 28 02/20/2014 1251   ALKPHOS 55 02/20/2014 1251   BILITOT 0.44 02/20/2014 1251    I No results found for this basename: SPEP,  UPEP,   kappa and lambda light chains    Lab Results  Component Value Date   WBC 4.0 02/20/2014   NEUTROABS 2.6 02/20/2014   HGB 9.6* 02/20/2014   HCT 28.2* 02/20/2014   MCV 99.3 02/20/2014   PLT 327 02/20/2014      Chemistry      Component Value Date/Time   NA 138 02/20/2014 1251   K 4.0 02/20/2014 1251   CO2 22 02/20/2014 1251   BUN 14.2 02/20/2014 1251   CREATININE 0.8 02/20/2014 1251      Component Value Date/Time   CALCIUM 9.1 02/20/2014 1251   ALKPHOS 55 02/20/2014 1251   AST 20 02/20/2014 1251   ALT 28 02/20/2014 1251   BILITOT 0.44 02/20/2014 1251      STUDIES: No results found.   ASSESSMENT: 53 y.o. BRCA negative [place] woman status post left breast biopsy for 01/18/2014 showing invasive lobular carcinoma (E-cadherin negative), estrogen receptor 70% positive, progesterone receptor 70% positive, with an MIB-1 of 7%, and no HER-2 amplification  (1) ultrasound-guided biopsy of a suspicious lymph node in the left axilla 08/19/2012 was positive  (2) status post left lumpectomy and axillary lymph node dissection 09/09/2013 for a pT1c pN2, stage IIIa invasive lobular carcinoma, grade 1, repeat HER-2 again negative. [She had 8 of 23 axillary lymph nodes involved]  (4) Adjuvant chemotherapy consisting of doxorubicin and cyclophosphamide in dose dense fashion x4, with Neulasta support, completed 12/18/2013; to be followed by paclitaxel weekly x 12  (5) radiation to follow chemotherapy  (6) antiestrogen therapy for 10 years to follow radiation   PLAN: The labs were reviewed in detail. Recommend that she  proceed with cycle 8 of her weekly Taxol. She has been tolerating this well.  Vasilia will return next week for labs, an office visit, and week 9 of treatment. She understands and agrees with this plan. She knows the goal of treatment in her case is cure. She has been encouraged to call with any issues that might arise before her next visit here.   Mikey Bussing, [place] [phone] 02/20/2014

## 2014-02-20 NOTE — Patient Instructions (Signed)

## 2014-02-23 ENCOUNTER — Encounter: Payer: Self-pay | Admitting: Oncology

## 2014-02-23 NOTE — Progress Notes (Signed)
Insurance is paying Tax inspector

## 2014-02-27 ENCOUNTER — Ambulatory Visit (HOSPITAL_BASED_OUTPATIENT_CLINIC_OR_DEPARTMENT_OTHER): Payer: BC Managed Care – PPO | Admitting: Nurse Practitioner

## 2014-02-27 ENCOUNTER — Ambulatory Visit (HOSPITAL_BASED_OUTPATIENT_CLINIC_OR_DEPARTMENT_OTHER): Payer: BC Managed Care – PPO

## 2014-02-27 ENCOUNTER — Telehealth: Payer: Self-pay | Admitting: Nurse Practitioner

## 2014-02-27 ENCOUNTER — Other Ambulatory Visit: Payer: Self-pay | Admitting: Hematology and Oncology

## 2014-02-27 ENCOUNTER — Other Ambulatory Visit (HOSPITAL_BASED_OUTPATIENT_CLINIC_OR_DEPARTMENT_OTHER): Payer: BC Managed Care – PPO

## 2014-02-27 ENCOUNTER — Encounter: Payer: Self-pay | Admitting: Nurse Practitioner

## 2014-02-27 ENCOUNTER — Ambulatory Visit: Payer: BC Managed Care – PPO

## 2014-02-27 VITALS — BP 139/80 | HR 99 | Temp 98.3°F | Resp 18 | Ht 66.0 in | Wt 150.0 lb

## 2014-02-27 DIAGNOSIS — C773 Secondary and unspecified malignant neoplasm of axilla and upper limb lymph nodes: Secondary | ICD-10-CM

## 2014-02-27 DIAGNOSIS — C50212 Malignant neoplasm of upper-inner quadrant of left female breast: Secondary | ICD-10-CM

## 2014-02-27 DIAGNOSIS — C50812 Malignant neoplasm of overlapping sites of left female breast: Secondary | ICD-10-CM

## 2014-02-27 DIAGNOSIS — Z95828 Presence of other vascular implants and grafts: Secondary | ICD-10-CM

## 2014-02-27 DIAGNOSIS — Z5111 Encounter for antineoplastic chemotherapy: Secondary | ICD-10-CM

## 2014-02-27 DIAGNOSIS — Z170 Estrogen receptor positive status [ER+]: Secondary | ICD-10-CM

## 2014-02-27 DIAGNOSIS — F411 Generalized anxiety disorder: Secondary | ICD-10-CM

## 2014-02-27 DIAGNOSIS — C50912 Malignant neoplasm of unspecified site of left female breast: Secondary | ICD-10-CM

## 2014-02-27 LAB — CBC WITH DIFFERENTIAL/PLATELET
BASO%: 1 % (ref 0.0–2.0)
BASOS ABS: 0 10*3/uL (ref 0.0–0.1)
EOS%: 2.3 % (ref 0.0–7.0)
Eosinophils Absolute: 0.1 10*3/uL (ref 0.0–0.5)
HEMATOCRIT: 31.3 % — AB (ref 34.8–46.6)
HEMOGLOBIN: 10.6 g/dL — AB (ref 11.6–15.9)
LYMPH#: 0.7 10*3/uL — AB (ref 0.9–3.3)
LYMPH%: 16.3 % (ref 14.0–49.7)
MCH: 33.2 pg (ref 25.1–34.0)
MCHC: 33.9 g/dL (ref 31.5–36.0)
MCV: 98.1 fL (ref 79.5–101.0)
MONO#: 0.5 10*3/uL (ref 0.1–0.9)
MONO%: 11.8 % (ref 0.0–14.0)
NEUT#: 2.7 10*3/uL (ref 1.5–6.5)
NEUT%: 68.6 % (ref 38.4–76.8)
Platelets: 392 10*3/uL (ref 145–400)
RBC: 3.19 10*6/uL — ABNORMAL LOW (ref 3.70–5.45)
RDW: 14.9 % — ABNORMAL HIGH (ref 11.2–14.5)
WBC: 4 10*3/uL (ref 3.9–10.3)
nRBC: 0 % (ref 0–0)

## 2014-02-27 LAB — COMPREHENSIVE METABOLIC PANEL (CC13)
ALT: 29 U/L (ref 0–55)
AST: 19 U/L (ref 5–34)
Albumin: 4 g/dL (ref 3.5–5.0)
Alkaline Phosphatase: 60 U/L (ref 40–150)
Anion Gap: 11 mEq/L (ref 3–11)
BUN: 13 mg/dL (ref 7.0–26.0)
CALCIUM: 9.3 mg/dL (ref 8.4–10.4)
CO2: 22 mEq/L (ref 22–29)
CREATININE: 0.8 mg/dL (ref 0.6–1.1)
Chloride: 107 mEq/L (ref 98–109)
Glucose: 98 mg/dl (ref 70–140)
Potassium: 3.7 mEq/L (ref 3.5–5.1)
Sodium: 140 mEq/L (ref 136–145)
Total Bilirubin: 0.61 mg/dL (ref 0.20–1.20)
Total Protein: 7 g/dL (ref 6.4–8.3)

## 2014-02-27 MED ORDER — DIPHENHYDRAMINE HCL 50 MG/ML IJ SOLN
25.0000 mg | Freq: Once | INTRAMUSCULAR | Status: AC
  Administered 2014-02-27: 25 mg via INTRAVENOUS

## 2014-02-27 MED ORDER — FAMOTIDINE IN NACL 20-0.9 MG/50ML-% IV SOLN
20.0000 mg | Freq: Once | INTRAVENOUS | Status: AC
  Administered 2014-02-27: 20 mg via INTRAVENOUS

## 2014-02-27 MED ORDER — ONDANSETRON 8 MG/NS 50 ML IVPB
INTRAVENOUS | Status: AC
  Filled 2014-02-27: qty 8

## 2014-02-27 MED ORDER — SODIUM CHLORIDE 0.9 % IJ SOLN
10.0000 mL | INTRAMUSCULAR | Status: DC | PRN
  Administered 2014-02-27: 10 mL via INTRAVENOUS
  Filled 2014-02-27: qty 10

## 2014-02-27 MED ORDER — DIPHENHYDRAMINE HCL 50 MG/ML IJ SOLN
INTRAMUSCULAR | Status: AC
  Filled 2014-02-27: qty 1

## 2014-02-27 MED ORDER — DEXAMETHASONE SODIUM PHOSPHATE 10 MG/ML IJ SOLN
INTRAMUSCULAR | Status: AC
  Filled 2014-02-27: qty 1

## 2014-02-27 MED ORDER — SODIUM CHLORIDE 0.9 % IV SOLN
Freq: Once | INTRAVENOUS | Status: AC
  Administered 2014-02-27: 15:00:00 via INTRAVENOUS

## 2014-02-27 MED ORDER — DEXAMETHASONE SODIUM PHOSPHATE 10 MG/ML IJ SOLN
10.0000 mg | Freq: Once | INTRAMUSCULAR | Status: AC
  Administered 2014-02-27: 10 mg via INTRAVENOUS

## 2014-02-27 MED ORDER — ONDANSETRON 8 MG/50ML IVPB (CHCC)
8.0000 mg | Freq: Once | INTRAVENOUS | Status: AC
  Administered 2014-02-27: 8 mg via INTRAVENOUS

## 2014-02-27 MED ORDER — FAMOTIDINE IN NACL 20-0.9 MG/50ML-% IV SOLN
INTRAVENOUS | Status: AC
  Filled 2014-02-27: qty 50

## 2014-02-27 MED ORDER — HEPARIN SOD (PORK) LOCK FLUSH 100 UNIT/ML IV SOLN
500.0000 [IU] | Freq: Once | INTRAVENOUS | Status: AC | PRN
  Administered 2014-02-27: 500 [IU]
  Filled 2014-02-27: qty 5

## 2014-02-27 MED ORDER — PACLITAXEL CHEMO INJECTION 300 MG/50ML
80.0000 mg/m2 | Freq: Once | INTRAVENOUS | Status: AC
  Administered 2014-02-27: 144 mg via INTRAVENOUS
  Filled 2014-02-27: qty 24

## 2014-02-27 MED ORDER — SODIUM CHLORIDE 0.9 % IJ SOLN
10.0000 mL | INTRAMUSCULAR | Status: DC | PRN
  Administered 2014-02-27: 10 mL
  Filled 2014-02-27: qty 10

## 2014-02-27 NOTE — Progress Notes (Signed)
[place]  Telephone:(336) [phone] Fax:(336) [phone]     ID: [name] [name] OB: 1960/10/19  MR#: [id number]  XBW#:[id number]  PCP: [name], MD GYN:  [name] SU: Excell Seltzer OTHER MD: [name]  CHIEF COMPLAINT: Locally advanced breast cancer for chemotherapy f/u. CURRENT TREATMENT: adjuvant chemotherapy  BREAST CANCER HISTORY:  [name] [name] is 53 years old and she first noted a change, like a small hard pea in her left axilla. She brought this to Dr. [name] attention and bilateral diagnostic mammography and left ultrasonography at the breast Center for 07/18/2013 showed a small area of asymmetry in the inner left breast measuring perhaps 5 mm which was not palpable by physical exam. In the right breast there was some loosely grouped calcifications spanning up to 0.7 cm. Ultrasound of the left breast confirmed an ill-defined area of shadowing at the 9:00 position measuring up to 1.3 cm. There was also a 4 mm circumscribed hypoechoic mass at the 10:00 position. This is felt to be probably benign. In the left axilla there were 2 lymph nodes with slightly increased cortical thickening, although the fatty hila were maintained. These were not related to the patient's feeling of a "pea like mass" in her left axilla, which had by then pretty much resolved.   On 08/07/2013 the patient underwent left breast needle core biopsy and this showed ([id number]) and invasive lobular carcinoma (E-cadherin negative) estrogen receptor 70% positive with strong staining intensity, progesterone receptor 70% positive and strong staining intensity, with a proliferation marker of 7% and no HER-2 amplification, the signals ratio being 0.94 in the number per cell 1.50.  Bilateral breast MRI 08/15/2013 found an irregular spiculated mass in the left breast measuring approximately 1.5 cm. There was no abnormal enhancement of the pectoralis. There were at least 2 left axillary lymph nodes and  showed focal cortical thickening. There was a single left internal mammary lymph node at the level of the biopsy-proven malignancy which was not pathologic in appearance. The right breast and axilla were benign.  Genetics testing may 2015 was normal and did not reveal a mutation in the genes tested: ATM, BARD1, BRCA1, BRCA2, BRIP1, CDH1, CHEK2, MRE11A, MUTYH, NBN, NF1, PALB2, PTEN, RAD50, RAD51C, RAD51D, and TP53. She is status post left lumpectomy and axillary lymph node dissection by Dr Excell Seltzer 09/09/2013 for a pT1c pN2, stage IIIa invasive lobular carcinoma, grade 1, repeat HER-2 again negative. Patient had 8/23 LN positive and also evidence of LVI (lymphovascular invasion) and PNI (perineural invasion).  INTERVAL HISTORY: [name] [name] returns today for follow up of her breast cancer, alone. Today is week 8 of 12 planned weekly cycles of pacliatxel. The interval history is generally unremarkable. Her nasal irritation is better with the nasal sprays that I suggested. She continues to battle fatigue, but this week it is has not been as intense as in the past. She takes naproxen for her back, hand, and knee joint pain.   REVIEW OF SYSTEMS: Again, [name] [name] denies fevers, chills, nausea, vomiting, or changes in bowel or bladder habits. She has no mouth sores, rashes, or peripheral neuropathy. Her appetite is healthy, despite some changes in taste. She has no shortness of breath, chest pain, cough, or palpitations. A detailed review of systems is otherwise negative.    PAST MEDICAL HISTORY: Past Medical History  Diagnosis Date  . Palpitations 2009    Improved; Secondary to premature ventricular contractions. No problems since-pt states was caring for ailing father, anxiety  . Anxiety disorder  Mild: treated by her gynecologist with selective serotonin reuptake inhibitor  . Nonspecific abnormal electrocardiogram (ECG) (EKG) 2009  . GERD (gastroesophageal reflux disease)     rolaids  . Hot flashes   .  Malignant neoplasm of breast (female), unspecified site   . Family history of malignant neoplasm of breast   . Arthritis     hands and feet  . Wears glasses     PAST SURGICAL HISTORY: Past Surgical History  Procedure Laterality Date  . Thyroidectomy, partial    . Cesarean section    . Endometrial ablation    . Dilation and curettage of uterus    . Laparoscopic assisted vaginal hysterectomy  02/07/2011    Procedure: LAPAROSCOPIC ASSISTED VAGINAL HYSTERECTOMY;  Surgeon: Zelphia Cairo;  Location: [facility];  Service: Gynecology;  Laterality: N/A;  . Abdominal hysterectomy  02/07/2011    Procedure: HYSTERECTOMY ABDOMINAL;  Surgeon: Zelphia Cairo;  Location: [facility];  Service: Gynecology;  Laterality: N/A;  . Breast lumpectomy with needle localization and axillary lymph node dissection Left 09/09/2013    Procedure: LEFT BREAST LUMPECTOMY WITH NEEDLE LOCALIZATION AND LEFT AXILLARY LYMPH NODE DISSECTION;  Surgeon: Mariella Saa, MD;  Location: [facility];  Service: General;  Laterality: Left;  . Portacath placement Right 09/29/2013    Procedure: INSERTION PORT-A-CATH;  Surgeon: Mariella Saa, MD;  Location: [facility];  Service: General;  Laterality: Right;    FAMILY HISTORY Family History  Problem Relation Age of Onset  . Angina Father   . Aortic aneurysm Father   . Dementia Father   . Arrhythmia Maternal Aunt     A fib  . Breast cancer Maternal Aunt 56    currently 4  . Hypertension Other   . Arrhythmia Other     Uncle- a fib  . Arrhythmia Maternal Aunt     A fib  . Breast cancer Maternal Aunt     dx 51s; currently 65s  . Breast cancer Mother     dx 55s. Died at 71  . Colon cancer Maternal Grandmother     dx 71s; died at 33  . Colon cancer Maternal Grandfather     dx 62s; died in 87s  . Cancer Paternal Uncle     unknown primary in early 58s  . Breast cancer Maternal Aunt     dx 45s; died in 79s  The patient's father died from heart  disease in the setting of dementia at the age of 78. The patient's mother died at the age of 45. She had been diagnosed with breast cancer at the age of 41. The patient is a single child. Her mother had 4 sisters, 3 of whom had breast cancer diagnosed at the age of 24, 58, and 78. There is also history of colorectal cancer on the maternal side of the family. BRCA testing negative.  GYNECOLOGIC HISTORY:   menarche age 37, first live birth age 54, the patient is [place] P2. She underwent a hysterectomy without salpingo-oophorectomy in 2012. She took birth control pills approximately 3 years remotely, with no significant complications   SOCIAL HISTORY:  The patient works as a Systems developer for [employer]. Her husband Onalee Hua also works for [employer], running [employer].. [name] [name] lives in [place] where he works as a Wellsite geologist. Daughter Burnett Harry this in [place], where she works as an Public librarian in a [employer]. The patient has no grandchildren. She is a Engineer, water.   ADVANCED DIRECTIVES:  Not in place   HEALTH MAINTENANCE: History  Substance Use Topics  . Smoking status: Never Smoker   . Smokeless tobacco: Not on file  . Alcohol Use: Yes     Comment: drinks 1-2 beers a week     Colonoscopy:2010   PAP:2012, s/p hysterectomy.  Bone density:2012   Lipid panel: 11/05/2013 TC 219, HDL 41, LDL 137, TG 204  No Known Allergies  Current Outpatient Prescriptions  Medication Sig Dispense Refill  . calcium carbonate-magnesium hydroxide (ROLAIDS) 334 MG CHEW Chew 1 tablet by mouth 2 (two) times daily as needed. For indigestion       . Cholecalciferol (VITAMIN D) 2000 UNITS tablet Take 2,000 Units by mouth daily.      Marland Kitchen LORazepam (ATIVAN) 0.5 MG tablet Take 1 tablet (0.5 mg total) by mouth at bedtime.  30 tablet  0  . mupirocin nasal ointment (BACTROBAN) 2 % Place 1 application into the nose 2 (two) times daily. Use oinment in nostrils twice daily x 5 days.      . naproxen sodium (ANAPROX)  220 MG tablet Take 220 mg by mouth 2 (two) times daily as needed.      . NON FORMULARY       . ondansetron (ZOFRAN) 8 MG tablet Take by mouth every 8 (eight) hours as needed for nausea or vomiting.      . diclofenac (VOLTAREN) 75 MG EC tablet Take 75 mg by mouth as needed.      . gabapentin (NEURONTIN) 300 MG capsule Take 1 capsule (300 mg total) by mouth at bedtime.  30 capsule  6  . loratadine (CLARITIN) 10 MG tablet Take 10 mg by mouth daily.      Marland Kitchen oxyCODONE (OXY IR/ROXICODONE) 5 MG immediate release tablet Take 1-2 tablets (5-10 mg total) by mouth every 6 (six) hours as needed.  30 tablet  0  . prochlorperazine (COMPAZINE) 10 MG tablet Take 10 mg by mouth every 6 (six) hours as needed for nausea or vomiting.       No current facility-administered medications for this visit.    OBJECTIVE: Filed Vitals:   02/27/14 1406  BP: 139/80  Pulse: 99  Temp: 98.3 F (36.8 C)  Resp: 18     Body mass index is 24.22 kg/(m^2).      GENERAL: Patient is a well appearing female in no acute distress Sclerae unicteric, pupils equal and reactive Oropharynx clear and moist-- no thrush No cervical or supraclavicular adenopathy Lungs no rales or rhonchi Heart regular rate and rhythm Abd soft, nontender, positive bowel sounds MSK no focal spinal tenderness, no upper extremity lymphedema Neuro: nonfocal, well oriented, appropriate affect Breasts: deferred, axillae benign  ECOG FS:1 - Symptomatic but completely ambulatory   LAB RESULTS:  CMP     Component Value Date/Time   NA 140 02/27/2014 1324   K 3.7 02/27/2014 1324   CO2 22 02/27/2014 1324   GLUCOSE 98 02/27/2014 1324   BUN 13.0 02/27/2014 1324   CREATININE 0.8 02/27/2014 1324   CALCIUM 9.3 02/27/2014 1324   PROT 7.0 02/27/2014 1324   ALBUMIN 4.0 02/27/2014 1324   AST 19 02/27/2014 1324   ALT 29 02/27/2014 1324   ALKPHOS 60 02/27/2014 1324   BILITOT 0.61 02/27/2014 1324   I No results found for this basename: SPEP,  UPEP,   kappa  and lambda light chains    Lab Results  Component Value Date   WBC 4.0 02/27/2014   NEUTROABS 2.7 02/27/2014  HGB 10.6* 02/27/2014   HCT 31.3* 02/27/2014   MCV 98.1 02/27/2014   PLT 392 02/27/2014      Chemistry      Component Value Date/Time   NA 140 02/27/2014 1324   K 3.7 02/27/2014 1324   CO2 22 02/27/2014 1324   BUN 13.0 02/27/2014 1324   CREATININE 0.8 02/27/2014 1324      Component Value Date/Time   CALCIUM 9.3 02/27/2014 1324   ALKPHOS 60 02/27/2014 1324   AST 19 02/27/2014 1324   ALT 29 02/27/2014 1324   BILITOT 0.61 02/27/2014 1324      STUDIES: No results found.   ASSESSMENT: 53 y.o. BRCA negative [place] woman status post left breast biopsy for 01/18/2014 showing invasive lobular carcinoma (E-cadherin negative), estrogen receptor 70% positive, progesterone receptor 70% positive, with an MIB-1 of 7%, and no HER-2 amplification  (1) ultrasound-guided biopsy of a suspicious lymph node in the left axilla 08/19/2012 was positive  (2) status post left lumpectomy and axillary lymph node dissection 09/09/2013 for a pT1c pN2, stage IIIa invasive lobular carcinoma, grade 1, repeat HER-2 again negative. [She had 8 of 23 axillary lymph nodes involved]  (4) Adjuvant chemotherapy consisting of doxorubicin and cyclophosphamide in dose dense fashion x4, with Neulasta support, completed 12/18/2013; to be followed by paclitaxel weekly x 12  (5) radiation to follow chemotherapy  (6) antiestrogen therapy for 10 years to follow radiation   PLAN: [name] [name] is doing well today. The labs were reviewed in detail and were entirely stable. We will proceed with cycle 9 of 12 weekly planned doses of taxol today.   [name] [name] will return next week for labs, an office visit, and the start of week 10 of treatment. She understands and agrees with this plan. She knows the goal of treatment in her case is cure. She has been encouraged to call with any issues that might arise before her next  visit here.    Marcelino Duster, [place] [phone] 02/27/2014

## 2014-02-27 NOTE — Patient Instructions (Signed)
[facility] Discharge Instructions for Patients Receiving Chemotherapy  Today you received the following chemotherapy agents: Taxol.  To help prevent nausea and vomiting after your treatment, we encourage you to take your nausea medication as prescribed.   If you develop nausea and vomiting that is not controlled by your nausea medication, call the clinic.   BELOW ARE SYMPTOMS THAT SHOULD BE REPORTED IMMEDIATELY:  *FEVER GREATER THAN 100.5 F  *CHILLS WITH OR WITHOUT FEVER  NAUSEA AND VOMITING THAT IS NOT CONTROLLED WITH YOUR NAUSEA MEDICATION  *UNUSUAL SHORTNESS OF BREATH  *UNUSUAL BRUISING OR BLEEDING  TENDERNESS IN MOUTH AND THROAT WITH OR WITHOUT PRESENCE OF ULCERS  *URINARY PROBLEMS  *BOWEL PROBLEMS  UNUSUAL RASH Items with * indicate a potential emergency and should be followed up as soon as possible.  Feel free to call the clinic you have any questions or concerns. The clinic phone number is [phone].    

## 2014-02-27 NOTE — Telephone Encounter (Signed)
per pof to sch pt appt-sch and sent MW email to move trmt to coordinate w/MS appt-pt to get updated sch on 11/6

## 2014-02-27 NOTE — Patient Instructions (Signed)

## 2014-02-27 NOTE — Progress Notes (Signed)
Pt in for [facility] lab draw, no blood return was noted from pt port, flushed well with no discomfort. Informed Sharyn Lull in treatment room and Natro,LPN with Clemon Chambers

## 2014-03-02 ENCOUNTER — Telehealth: Payer: Self-pay | Admitting: Oncology

## 2014-03-02 ENCOUNTER — Telehealth: Payer: Self-pay | Admitting: *Deleted

## 2014-03-02 NOTE — Telephone Encounter (Signed)
I have adjusted 11/13 and 11/20 aptps

## 2014-03-02 NOTE — Telephone Encounter (Signed)
per pof to sch pt appt-LL sch not opened for 5/16-gave Kathrine Cords pof to sch after sch open

## 2014-03-06 ENCOUNTER — Ambulatory Visit (HOSPITAL_BASED_OUTPATIENT_CLINIC_OR_DEPARTMENT_OTHER): Payer: BC Managed Care – PPO

## 2014-03-06 ENCOUNTER — Other Ambulatory Visit (HOSPITAL_BASED_OUTPATIENT_CLINIC_OR_DEPARTMENT_OTHER): Payer: BC Managed Care – PPO

## 2014-03-06 ENCOUNTER — Ambulatory Visit: Payer: BC Managed Care – PPO

## 2014-03-06 ENCOUNTER — Ambulatory Visit (HOSPITAL_BASED_OUTPATIENT_CLINIC_OR_DEPARTMENT_OTHER): Payer: BC Managed Care – PPO | Admitting: Oncology

## 2014-03-06 VITALS — BP 128/72 | HR 95 | Temp 98.4°F | Resp 18 | Ht 66.0 in | Wt 150.9 lb

## 2014-03-06 DIAGNOSIS — C50211 Malignant neoplasm of upper-inner quadrant of right female breast: Secondary | ICD-10-CM

## 2014-03-06 DIAGNOSIS — C50812 Malignant neoplasm of overlapping sites of left female breast: Secondary | ICD-10-CM

## 2014-03-06 DIAGNOSIS — C50212 Malignant neoplasm of upper-inner quadrant of left female breast: Secondary | ICD-10-CM

## 2014-03-06 DIAGNOSIS — F411 Generalized anxiety disorder: Secondary | ICD-10-CM

## 2014-03-06 DIAGNOSIS — Z170 Estrogen receptor positive status [ER+]: Secondary | ICD-10-CM

## 2014-03-06 DIAGNOSIS — C773 Secondary and unspecified malignant neoplasm of axilla and upper limb lymph nodes: Secondary | ICD-10-CM

## 2014-03-06 DIAGNOSIS — Z452 Encounter for adjustment and management of vascular access device: Secondary | ICD-10-CM

## 2014-03-06 DIAGNOSIS — Z5111 Encounter for antineoplastic chemotherapy: Secondary | ICD-10-CM

## 2014-03-06 LAB — COMPREHENSIVE METABOLIC PANEL (CC13)
ALT: 26 U/L (ref 0–55)
AST: 18 U/L (ref 5–34)
Albumin: 3.7 g/dL (ref 3.5–5.0)
Alkaline Phosphatase: 54 U/L (ref 40–150)
Anion Gap: 9 mEq/L (ref 3–11)
BUN: 12.6 mg/dL (ref 7.0–26.0)
CO2: 24 meq/L (ref 22–29)
CREATININE: 0.8 mg/dL (ref 0.6–1.1)
Calcium: 9.1 mg/dL (ref 8.4–10.4)
Chloride: 107 mEq/L (ref 98–109)
Glucose: 113 mg/dl (ref 70–140)
Potassium: 3.5 mEq/L (ref 3.5–5.1)
Sodium: 140 mEq/L (ref 136–145)
Total Bilirubin: 0.56 mg/dL (ref 0.20–1.20)
Total Protein: 6.5 g/dL (ref 6.4–8.3)

## 2014-03-06 LAB — CBC WITH DIFFERENTIAL/PLATELET
BASO%: 0.8 % (ref 0.0–2.0)
Basophils Absolute: 0 10*3/uL (ref 0.0–0.1)
EOS ABS: 0.1 10*3/uL (ref 0.0–0.5)
EOS%: 2.5 % (ref 0.0–7.0)
HCT: 31.5 % — ABNORMAL LOW (ref 34.8–46.6)
HGB: 10.7 g/dL — ABNORMAL LOW (ref 11.6–15.9)
LYMPH%: 17.1 % (ref 14.0–49.7)
MCH: 33.4 pg (ref 25.1–34.0)
MCHC: 34 g/dL (ref 31.5–36.0)
MCV: 98.4 fL (ref 79.5–101.0)
MONO#: 0.4 10*3/uL (ref 0.1–0.9)
MONO%: 12.1 % (ref 0.0–14.0)
NEUT#: 2.4 10*3/uL (ref 1.5–6.5)
NEUT%: 67.5 % (ref 38.4–76.8)
PLATELETS: 348 10*3/uL (ref 145–400)
RBC: 3.2 10*6/uL — ABNORMAL LOW (ref 3.70–5.45)
RDW: 14.8 % — ABNORMAL HIGH (ref 11.2–14.5)
WBC: 3.6 10*3/uL — AB (ref 3.9–10.3)
lymph#: 0.6 10*3/uL — ABNORMAL LOW (ref 0.9–3.3)

## 2014-03-06 MED ORDER — HEPARIN SOD (PORK) LOCK FLUSH 100 UNIT/ML IV SOLN
500.0000 [IU] | Freq: Once | INTRAVENOUS | Status: DC
  Filled 2014-03-06: qty 5

## 2014-03-06 MED ORDER — ONDANSETRON 8 MG/NS 50 ML IVPB
INTRAVENOUS | Status: AC
  Filled 2014-03-06: qty 8

## 2014-03-06 MED ORDER — SODIUM CHLORIDE 0.9 % IJ SOLN
10.0000 mL | INTRAMUSCULAR | Status: DC | PRN
  Administered 2014-03-06: 10 mL via INTRAVENOUS
  Filled 2014-03-06: qty 10

## 2014-03-06 MED ORDER — DEXAMETHASONE SODIUM PHOSPHATE 10 MG/ML IJ SOLN
10.0000 mg | Freq: Once | INTRAMUSCULAR | Status: AC
  Administered 2014-03-06: 10 mg via INTRAVENOUS

## 2014-03-06 MED ORDER — SODIUM CHLORIDE 0.9 % IJ SOLN
10.0000 mL | INTRAMUSCULAR | Status: DC | PRN
  Administered 2014-03-06: 10 mL
  Filled 2014-03-06: qty 10

## 2014-03-06 MED ORDER — SODIUM CHLORIDE 0.9 % IV SOLN
Freq: Once | INTRAVENOUS | Status: AC
  Administered 2014-03-06: 12:00:00 via INTRAVENOUS

## 2014-03-06 MED ORDER — HEPARIN SOD (PORK) LOCK FLUSH 100 UNIT/ML IV SOLN
500.0000 [IU] | Freq: Once | INTRAVENOUS | Status: AC | PRN
  Administered 2014-03-06: 500 [IU]
  Filled 2014-03-06: qty 5

## 2014-03-06 MED ORDER — DIPHENHYDRAMINE HCL 50 MG/ML IJ SOLN
INTRAMUSCULAR | Status: AC
  Filled 2014-03-06: qty 1

## 2014-03-06 MED ORDER — DEXAMETHASONE SODIUM PHOSPHATE 10 MG/ML IJ SOLN
INTRAMUSCULAR | Status: AC
  Filled 2014-03-06: qty 1

## 2014-03-06 MED ORDER — PACLITAXEL CHEMO INJECTION 300 MG/50ML
80.0000 mg/m2 | Freq: Once | INTRAVENOUS | Status: AC
  Administered 2014-03-06: 144 mg via INTRAVENOUS
  Filled 2014-03-06: qty 24

## 2014-03-06 MED ORDER — ALTEPLASE 2 MG IJ SOLR
2.0000 mg | Freq: Once | INTRAMUSCULAR | Status: AC | PRN
  Administered 2014-03-06: 2 mg
  Filled 2014-03-06: qty 2

## 2014-03-06 MED ORDER — DIPHENHYDRAMINE HCL 50 MG/ML IJ SOLN
25.0000 mg | Freq: Once | INTRAMUSCULAR | Status: AC
  Administered 2014-03-06: 25 mg via INTRAVENOUS

## 2014-03-06 MED ORDER — FAMOTIDINE IN NACL 20-0.9 MG/50ML-% IV SOLN
INTRAVENOUS | Status: AC
  Filled 2014-03-06: qty 50

## 2014-03-06 MED ORDER — FAMOTIDINE IN NACL 20-0.9 MG/50ML-% IV SOLN
20.0000 mg | Freq: Once | INTRAVENOUS | Status: AC
  Administered 2014-03-06: 20 mg via INTRAVENOUS

## 2014-03-06 MED ORDER — ONDANSETRON 8 MG/50ML IVPB (CHCC)
8.0000 mg | Freq: Once | INTRAVENOUS | Status: AC
  Administered 2014-03-06: 8 mg via INTRAVENOUS

## 2014-03-06 NOTE — Progress Notes (Signed)
[place]  [phone] [phone]     ID: [name] [name] OB: Sep 29, 1960  MR#: [id number]  JYN#:[id number]  PCP: [name] Pearson, MD GYN:  [name] [name] SU: [name] [name] OTHER MD: [name] [name]  CHIEF COMPLAINT: Locally advanced breast cancer for chemotherapy f/u. CURRENT TREATMENT: adjuvant chemotherapy  BREAST CANCER HISTORY:  [name] [name] is 53 years old and she first noted a change, like a small hard pea in her left axilla. She brought this to Dr. Orvan [name] attention and bilateral diagnostic mammography and left ultrasonography at the breast Center for 07/18/2013 showed a small area of asymmetry in the inner left breast measuring perhaps 5 mm which was not palpable by physical exam. In the right breast there was some loosely grouped calcifications spanning up to 0.7 cm. Ultrasound of the left breast confirmed an ill-defined area of shadowing at the 9:00 position measuring up to 1.3 cm. There was also a 4 mm circumscribed hypoechoic mass at the 10:00 position. This is felt to be probably benign. In the left axilla there were 2 lymph nodes with slightly increased cortical thickening, although the fatty hila were maintained. These were not related to the patient's feeling of a "pea like mass" in her left axilla, which had by then pretty much resolved.   On 08/07/2013 the patient underwent left breast needle core biopsy and this showed ([id number]) and invasive lobular carcinoma (E-cadherin negative) estrogen receptor 70% positive with strong staining intensity, progesterone receptor 70% positive and strong staining intensity, with a proliferation marker of 7% and no HER-2 amplification, the signals ratio being 0.94 in the number per cell 1.50.  Bilateral breast MRI 08/15/2013 found an irregular spiculated mass in the left breast measuring approximately 1.5 cm. There was no abnormal enhancement of the pectoralis. There were at least 2 left axillary lymph nodes and  showed focal cortical thickening. There was a single left internal mammary lymph node at the level of the biopsy-proven malignancy which was not pathologic in appearance. The right breast and axilla were benign.  Genetics testing may 2015 was normal and did not reveal a mutation in the genes tested: ATM, BARD1, BRCA1, BRCA2, BRIP1, CDH1, CHEK2, MRE11A, MUTYH, NBN, NF1, PALB2, PTEN, RAD50, RAD51C, RAD51D, and TP53. She is status post left lumpectomy and axillary lymph node dissection by Dr [name] [name] 09/09/2013 for a pT1c pN2, stage IIIa invasive lobular carcinoma, grade 1, repeat HER-2 again negative. Patient had 8/23 LN positive and also evidence of LVI (lymphovascular invasion) and PNI (perineural invasion).  Her subsequent history is as detailed below  INTERVAL HISTORY: [name] [name] returns today for follow up of her breast cancer accompanied by her husband [name] [name]. Today is day 1 cycle 10 of 12 planned weekly cycles of paclitaxel.  REVIEW OF SYSTEMS: [name] is tolerating treatment well and in particular have been absolutely no symptoms of peripheral neuropathy. She does have some fatigue. She has continued to work. By the time she gets home there is not much left, she says. Luckily [name] [name] is a good clock. She sleeps most of the night, gets up at 6 every day. She is had no problems with nausea, although she has had some heartburn. Sometimes she has pain between her shoulder blades, but this is very intermittent and is not associated with any particular activity. She has a little bit of a runny nose. She is strongly considering bilateral salpingo-oophorectomy. She has occasional aches "here and there" but not more persistent or intense than prior. A detailed review of systems today was  otherwise noncontributory  PAST MEDICAL HISTORY: Past Medical History  Diagnosis Date  . Palpitations 2009    Improved; Secondary to premature ventricular contractions. No problems since-pt states was caring for ailing father,  anxiety  . Anxiety disorder     Mild: treated by her gynecologist with selective serotonin reuptake inhibitor  . Nonspecific abnormal electrocardiogram (ECG) (EKG) 2009  . GERD (gastroesophageal reflux disease)     rolaids  . Hot flashes   . Malignant neoplasm of breast (female), unspecified site   . Family history of malignant neoplasm of breast   . Arthritis     hands and feet  . Wears glasses     PAST SURGICAL HISTORY: Past Surgical History  Procedure Laterality Date  . Thyroidectomy, partial    . Cesarean section    . Endometrial ablation    . Dilation and curettage of uterus    . Laparoscopic assisted vaginal hysterectomy  02/07/2011    Procedure: LAPAROSCOPIC ASSISTED VAGINAL HYSTERECTOMY;  Surgeon: [name] [name];  Location: [facility];  Service: Gynecology;  Laterality: N/A;  . Abdominal hysterectomy  02/07/2011    Procedure: HYSTERECTOMY ABDOMINAL;  Surgeon: [name] [name];  Location: [facility];  Service: Gynecology;  Laterality: N/A;  . Breast lumpectomy with needle localization and axillary lymph node dissection Left 09/09/2013    Procedure: LEFT BREAST LUMPECTOMY WITH NEEDLE LOCALIZATION AND LEFT AXILLARY LYMPH NODE DISSECTION;  Surgeon: [name] Jolly, MD;  Location: [facility];  Service: General;  Laterality: Left;  . Portacath placement Right 09/29/2013    Procedure: INSERTION PORT-A-CATH;  Surgeon: [name] Jolly, MD;  Location: [facility];  Service: General;  Laterality: Right;    FAMILY HISTORY Family History  Problem Relation Age of Onset  . Angina Father   . Aortic aneurysm Father   . Dementia Father   . Arrhythmia Maternal Aunt     A fib  . Breast cancer Maternal Aunt 56    currently 2  . Hypertension Other   . Arrhythmia Other     Uncle- a fib  . Arrhythmia Maternal Aunt     A fib  . Breast cancer Maternal Aunt     dx 4s; currently 64s  . Breast cancer Mother     dx 51s. Died at 31  . Colon cancer Maternal  Grandmother     dx 3s; died at 76  . Colon cancer Maternal Grandfather     dx 90s; died in 71s  . Cancer Paternal Uncle     unknown primary in early 72s  . Breast cancer Maternal Aunt     dx 38s; died in 24s  The patient's father died from heart disease in the setting of dementia at the age of 5. The patient's mother died at the age of 57. She had been diagnosed with breast cancer at the age of 4. The patient is a single child. Her mother had 4 sisters, 3 of whom had breast cancer diagnosed at the age of 77, 61, and 26. There is also history of colorectal cancer on the maternal side of the family. BRCA testing negative.  GYNECOLOGIC HISTORY:   menarche age 28, first live birth age 76, the patient is [place] P2. She underwent a hysterectomy without salpingo-oophorectomy in 2012. 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 Her husband [name] [name] also works for [employer], running  their printing.. Evorn Gong lives in [place] where he works as a Public relations account executive. Daughter Darrick Penna this in [place], where she works as an Engineering geologist in a [employer]. The patient has no grandchildren. She is a Information systems manager.   ADVANCED DIRECTIVES: Not in place   HEALTH MAINTENANCE: History  Substance Use Topics  . Smoking status: Never Smoker   . Smokeless tobacco: Not on file  . Alcohol Use: Yes     Comment: drinks 1-2 beers a week     Colonoscopy:2010   PAP:2012, s/p hysterectomy.  Bone density:2012   Lipid panel: 11/05/2013 TC 219, HDL 41, LDL 137, TG 204  No Known Allergies  Current Outpatient Prescriptions  Medication Sig Dispense Refill  . calcium carbonate-magnesium hydroxide (ROLAIDS) 334 MG CHEW Chew 1 tablet by mouth 2 (two) times daily as needed. For indigestion     . Cholecalciferol (VITAMIN D) 2000 UNITS tablet Take 2,000 Units by mouth daily.    . diclofenac (VOLTAREN) 75  MG EC tablet Take 75 mg by mouth as needed.    . gabapentin (NEURONTIN) 300 MG capsule Take 1 capsule (300 mg total) by mouth at bedtime. 30 capsule 6  . loratadine (CLARITIN) 10 MG tablet Take 10 mg by mouth daily.    Marland Kitchen LORazepam (ATIVAN) 0.5 MG tablet Take 1 tablet (0.5 mg total) by mouth at bedtime. 30 tablet 0  . mupirocin nasal ointment (BACTROBAN) 2 % Place 1 application into the nose 2 (two) times daily. Use oinment in nostrils twice daily x 5 days.    . naproxen sodium (ANAPROX) 220 MG tablet Take 220 mg by mouth 2 (two) times daily as needed.    . NON FORMULARY     . ondansetron (ZOFRAN) 8 MG tablet Take by mouth every 8 (eight) hours as needed for nausea or vomiting.    Marland Kitchen oxyCODONE (OXY IR/ROXICODONE) 5 MG immediate release tablet Take 1-2 tablets (5-10 mg total) by mouth every 6 (six) hours as needed. 30 tablet 0  . prochlorperazine (COMPAZINE) 10 MG tablet Take 10 mg by mouth every 6 (six) hours as needed for nausea or vomiting.     No current facility-administered medications for this visit.    OBJECTIVE: middle-aged white woman in no acute distress Filed Vitals:   03/06/14 0955  BP: 128/72  Pulse: 95  Temp: 98.4 F (36.9 C)  Resp: 18     Body mass index is 24.37 kg/(m^2).      ECOG FS:1 - Symptomatic but completely ambulatory Sclerae unicteric, pupils equal and reactive Oropharynx clear and moist No cervical or supraclavicular adenopathy Lungs no rales or rhonchi Heart regular rate and rhythm Abd soft, nontender, positive bowel sounds MSK no focal spinal tenderness, no upper extremity lymphedema Neuro: nonfocal, well oriented, appropriate affect Breasts: the right breast is unremarkable. The left breast is status post lumpectomy. There is no evidence of local recurrence. The left axilla is benign.  LAB RESULTS:  CMP     Component Value Date/Time   NA 140 03/06/2014 0936   K 3.5 03/06/2014 0936   CO2 24 03/06/2014 0936   GLUCOSE 113 03/06/2014 0936   BUN 12.6  03/06/2014 0936   CREATININE 0.8 03/06/2014 0936   CALCIUM 9.1 03/06/2014 0936   PROT 6.5 03/06/2014 0936   ALBUMIN 3.7 03/06/2014 0936   AST 18 03/06/2014 0936   ALT 26 03/06/2014 0936   ALKPHOS 54 03/06/2014 0936   BILITOT 0.56 03/06/2014 0936   I No results found for: SPEP  Lab Results  Component Value Date   WBC 3.6* 03/06/2014   NEUTROABS 2.4 03/06/2014   HGB 10.7* 03/06/2014   HCT 31.5* 03/06/2014   MCV 98.4 03/06/2014   PLT 348 03/06/2014      Chemistry      Component Value Date/Time   NA 140 03/06/2014 0936   K 3.5 03/06/2014 0936   CO2 24 03/06/2014 0936   BUN 12.6 03/06/2014 0936   CREATININE 0.8 03/06/2014 0936      Component Value Date/Time   CALCIUM 9.1 03/06/2014 0936   ALKPHOS 54 03/06/2014 0936   AST 18 03/06/2014 0936   ALT 26 03/06/2014 0936   BILITOT 0.56 03/06/2014 0936      STUDIES: No results found.   ASSESSMENT: 53 y.o. BRCA negative [place] woman status post left breast biopsy for 01/18/2014 showing invasive lobular carcinoma (E-cadherin negative), estrogen receptor 70% positive, progesterone receptor 70% positive, with an MIB-1 of 7%, and no HER-2 amplification  (1) ultrasound-guided biopsy of a suspicious lymph node in the left axilla 08/19/2012 was positive  (2) status post left lumpectomy and axillary lymph node dissection 09/09/2013 for a pT1c pN2, stage IIIa invasive lobular carcinoma, grade 1, repeat HER-2 again negative. [She had 8 of 23 axillary lymph nodes involved]  (4) Adjuvant chemotherapy consisting of doxorubicin and cyclophosphamide in dose dense fashion x4 completed 12/18/2013; being followed by paclitaxel weekly x 12  (5) radiation to follow chemotherapy  (6) antiestrogen therapy for 10 years to follow radiation   PLAN: Simrah continues to tolerate her chemotherapy well and we are proceeding with the 10th of 12 weekly doses today. I do not anticipate any difficulties with her getting through the next 2 weeks  treatments, and therefore she should be finished with therapy by the end of November. She could begin radiation as soon as early December.  However, the patient and her husband have some family reunionplans that are already in place. She will discuss with Dr. Lisbeth Renshaw the appropriate start date of radiation.  She already has an appointment with Dr. Julien Girt for April and can discuss bilateral salpingo-oophorectomy at that time.  Idelia has a good understanding of the overall plan. She agrees with it. She knows the goal of treatment in her case is cure. She will call with any problems that may develop before her next visit here.     Chauncey Cruel, [place] [phone] 03/06/2014

## 2014-03-09 ENCOUNTER — Telehealth: Payer: Self-pay | Admitting: Oncology

## 2014-03-09 NOTE — Telephone Encounter (Signed)
, °

## 2014-03-13 ENCOUNTER — Ambulatory Visit (HOSPITAL_BASED_OUTPATIENT_CLINIC_OR_DEPARTMENT_OTHER): Payer: BC Managed Care – PPO

## 2014-03-13 ENCOUNTER — Ambulatory Visit (HOSPITAL_BASED_OUTPATIENT_CLINIC_OR_DEPARTMENT_OTHER): Payer: BC Managed Care – PPO | Admitting: Nurse Practitioner

## 2014-03-13 ENCOUNTER — Ambulatory Visit: Payer: BC Managed Care – PPO

## 2014-03-13 ENCOUNTER — Other Ambulatory Visit (HOSPITAL_BASED_OUTPATIENT_CLINIC_OR_DEPARTMENT_OTHER): Payer: BC Managed Care – PPO

## 2014-03-13 ENCOUNTER — Encounter: Payer: Self-pay | Admitting: Nurse Practitioner

## 2014-03-13 ENCOUNTER — Other Ambulatory Visit: Payer: BC Managed Care – PPO

## 2014-03-13 VITALS — BP 134/75 | HR 95 | Temp 98.1°F | Resp 18 | Ht 66.0 in | Wt 150.4 lb

## 2014-03-13 DIAGNOSIS — C50812 Malignant neoplasm of overlapping sites of left female breast: Secondary | ICD-10-CM

## 2014-03-13 DIAGNOSIS — Z170 Estrogen receptor positive status [ER+]: Secondary | ICD-10-CM

## 2014-03-13 DIAGNOSIS — C773 Secondary and unspecified malignant neoplasm of axilla and upper limb lymph nodes: Secondary | ICD-10-CM

## 2014-03-13 DIAGNOSIS — C50212 Malignant neoplasm of upper-inner quadrant of left female breast: Secondary | ICD-10-CM

## 2014-03-13 DIAGNOSIS — Z5111 Encounter for antineoplastic chemotherapy: Secondary | ICD-10-CM

## 2014-03-13 DIAGNOSIS — F411 Generalized anxiety disorder: Secondary | ICD-10-CM

## 2014-03-13 DIAGNOSIS — Z95828 Presence of other vascular implants and grafts: Secondary | ICD-10-CM

## 2014-03-13 LAB — COMPREHENSIVE METABOLIC PANEL (CC13)
ALK PHOS: 53 U/L (ref 40–150)
ALT: 26 U/L (ref 0–55)
AST: 19 U/L (ref 5–34)
Albumin: 3.9 g/dL (ref 3.5–5.0)
Anion Gap: 7 mEq/L (ref 3–11)
BILIRUBIN TOTAL: 0.51 mg/dL (ref 0.20–1.20)
BUN: 10.9 mg/dL (ref 7.0–26.0)
CALCIUM: 9.3 mg/dL (ref 8.4–10.4)
CO2: 23 mEq/L (ref 22–29)
CREATININE: 0.8 mg/dL (ref 0.6–1.1)
Chloride: 108 mEq/L (ref 98–109)
Glucose: 98 mg/dl (ref 70–140)
Potassium: 3.9 mEq/L (ref 3.5–5.1)
Sodium: 138 mEq/L (ref 136–145)
Total Protein: 6.6 g/dL (ref 6.4–8.3)

## 2014-03-13 LAB — CBC WITH DIFFERENTIAL/PLATELET
BASO%: 1.3 % (ref 0.0–2.0)
BASOS ABS: 0.1 10*3/uL (ref 0.0–0.1)
EOS%: 1.7 % (ref 0.0–7.0)
Eosinophils Absolute: 0.1 10*3/uL (ref 0.0–0.5)
HEMATOCRIT: 31.9 % — AB (ref 34.8–46.6)
HEMOGLOBIN: 10.7 g/dL — AB (ref 11.6–15.9)
LYMPH%: 14.7 % (ref 14.0–49.7)
MCH: 33 pg (ref 25.1–34.0)
MCHC: 33.5 g/dL (ref 31.5–36.0)
MCV: 98.4 fL (ref 79.5–101.0)
MONO#: 0.4 10*3/uL (ref 0.1–0.9)
MONO%: 9.7 % (ref 0.0–14.0)
NEUT#: 2.9 10*3/uL (ref 1.5–6.5)
NEUT%: 72.6 % (ref 38.4–76.8)
Platelets: 369 10*3/uL (ref 145–400)
RBC: 3.24 10*6/uL — ABNORMAL LOW (ref 3.70–5.45)
RDW: 15.7 % — ABNORMAL HIGH (ref 11.2–14.5)
WBC: 4 10*3/uL (ref 3.9–10.3)
lymph#: 0.6 10*3/uL — ABNORMAL LOW (ref 0.9–3.3)

## 2014-03-13 MED ORDER — FAMOTIDINE IN NACL 20-0.9 MG/50ML-% IV SOLN
20.0000 mg | Freq: Once | INTRAVENOUS | Status: AC
  Administered 2014-03-13: 20 mg via INTRAVENOUS

## 2014-03-13 MED ORDER — DIPHENHYDRAMINE HCL 50 MG/ML IJ SOLN
INTRAMUSCULAR | Status: AC
  Filled 2014-03-13: qty 1

## 2014-03-13 MED ORDER — HEPARIN SOD (PORK) LOCK FLUSH 100 UNIT/ML IV SOLN
500.0000 [IU] | Freq: Once | INTRAVENOUS | Status: DC
  Filled 2014-03-13: qty 5

## 2014-03-13 MED ORDER — DEXAMETHASONE SODIUM PHOSPHATE 10 MG/ML IJ SOLN
10.0000 mg | Freq: Once | INTRAMUSCULAR | Status: AC
  Administered 2014-03-13: 10 mg via INTRAVENOUS

## 2014-03-13 MED ORDER — DEXAMETHASONE SODIUM PHOSPHATE 10 MG/ML IJ SOLN
INTRAMUSCULAR | Status: AC
  Filled 2014-03-13: qty 1

## 2014-03-13 MED ORDER — PACLITAXEL CHEMO INJECTION 300 MG/50ML
80.0000 mg/m2 | Freq: Once | INTRAVENOUS | Status: AC
  Administered 2014-03-13: 144 mg via INTRAVENOUS
  Filled 2014-03-13: qty 24

## 2014-03-13 MED ORDER — ONDANSETRON 8 MG/50ML IVPB (CHCC)
8.0000 mg | Freq: Once | INTRAVENOUS | Status: AC
  Administered 2014-03-13: 8 mg via INTRAVENOUS

## 2014-03-13 MED ORDER — HEPARIN SOD (PORK) LOCK FLUSH 100 UNIT/ML IV SOLN
500.0000 [IU] | Freq: Once | INTRAVENOUS | Status: AC | PRN
  Administered 2014-03-13: 500 [IU]
  Filled 2014-03-13: qty 5

## 2014-03-13 MED ORDER — FAMOTIDINE IN NACL 20-0.9 MG/50ML-% IV SOLN
INTRAVENOUS | Status: AC
  Filled 2014-03-13: qty 50

## 2014-03-13 MED ORDER — ONDANSETRON 8 MG/NS 50 ML IVPB
INTRAVENOUS | Status: AC
  Filled 2014-03-13: qty 8

## 2014-03-13 MED ORDER — SODIUM CHLORIDE 0.9 % IJ SOLN
10.0000 mL | INTRAMUSCULAR | Status: DC | PRN
  Administered 2014-03-13: 10 mL via INTRAVENOUS
  Filled 2014-03-13: qty 10

## 2014-03-13 MED ORDER — SODIUM CHLORIDE 0.9 % IV SOLN
Freq: Once | INTRAVENOUS | Status: AC
  Administered 2014-03-13: 14:00:00 via INTRAVENOUS

## 2014-03-13 MED ORDER — DIPHENHYDRAMINE HCL 50 MG/ML IJ SOLN
25.0000 mg | Freq: Once | INTRAMUSCULAR | Status: AC
  Administered 2014-03-13: 25 mg via INTRAVENOUS

## 2014-03-13 MED ORDER — SODIUM CHLORIDE 0.9 % IJ SOLN
10.0000 mL | INTRAMUSCULAR | Status: DC | PRN
  Administered 2014-03-13: 10 mL
  Filled 2014-03-13: qty 10

## 2014-03-13 NOTE — Progress Notes (Signed)
[name] [name]  Telephone:([phone]) [phone] Fax:([phone]) [phone]     ID: [name] [name] OB: [birth year]  MR#: [id number]  YWV#:[id number]  PCP: [name] Pearson, MD GYN:  [name] [name] SU: [name] [name] OTHER MD: [name] [name]  CHIEF COMPLAINT: Locally advanced breast cancer for chemotherapy f/u. CURRENT TREATMENT: adjuvant chemotherapy  BREAST CANCER HISTORY:  [name] [name] is 53 years old and she first noted a change, like a small hard pea in her left axilla. She brought this to [name]. Orvan [name] attention and bilateral diagnostic mammography and left ultrasonography at the breast Center for 07/18/2013 showed a small area of asymmetry in the inner left breast measuring perhaps 5 mm which was not palpable by physical exam. In the right breast there was some loosely grouped calcifications spanning up to 0.7 cm. Ultrasound of the left breast confirmed an ill-defined area of shadowing at the 9:00 position measuring up to 1.3 cm. There was also a 4 mm circumscribed hypoechoic mass at the 10:00 position. This is felt to be probably benign. In the left axilla there were 2 lymph nodes with slightly increased cortical thickening, although the fatty hila were maintained. These were not related to the patient's feeling of a "pea like mass" in her left axilla, which had by then pretty much resolved.   On 08/07/2013 the patient underwent left breast needle core biopsy and this showed ([id number]) and invasive lobular carcinoma (E-cadherin negative) estrogen receptor 70% positive with strong staining intensity, progesterone receptor 70% positive and strong staining intensity, with a proliferation marker of 7% and no HER-2 amplification, the signals ratio being 0.94 in the number per cell 1.50.  Bilateral breast MRI 08/15/2013 found an irregular spiculated mass in the left breast measuring approximately 1.5 cm. There was no abnormal enhancement of the pectoralis. There were at least 2 left axillary lymph nodes and  showed focal cortical thickening. There was a single left internal mammary lymph node at the level of the biopsy-proven malignancy which was not pathologic in appearance. The right breast and axilla were benign.  Genetics testing may 2015 was normal and did not reveal a mutation in the genes tested: ATM, BARD1, BRCA1, BRCA2, BRIP1, CDH1, CHEK2, MRE11A, MUTYH, NBN, NF1, PALB2, PTEN, RAD50, RAD51C, RAD51D, and TP53. She is status post left lumpectomy and axillary lymph node dissection by [name] [name] 09/09/2013 for a pT1c pN2, stage IIIa invasive lobular carcinoma, grade 1, repeat HER-2 again negative. Patient had 8/23 LN positive and also evidence of LVI (lymphovascular invasion) and PNI (perineural invasion).  Her subsequent history is as detailed below  INTERVAL HISTORY: [name] [name] returns today for follow up of her breast cancer accompanied by her husband [name] [name]. Today is day 1 cycle 11 of 12 planned weekly cycles of paclitaxel. As always, her main complaint is fatigue. This week she mentions left upper arm soreness. Her range of motion has been decreased as well. She forgot to mention these symptoms last week. Her nailbeds are becoming tender on this hand, but also has some tenderness to the right hand.   REVIEW OF SYSTEMS: Again, [name] denies fevers, chills, nausea, vomiting, or changes in bowel or bladder habits. She has no mouth sores, rashes, headaches, or dizziness. Her appetite is healthy, despite some changes in taste. She has no shortness of breath, chest pain, cough, or palpitations. A detailed review of systems is otherwise negative.   PAST MEDICAL HISTORY: Past Medical History  Diagnosis Date  . Palpitations 2009    Improved; Secondary to premature ventricular contractions. No problems since-pt  states was caring for ailing father, anxiety  . Anxiety disorder     Mild: treated by her gynecologist with selective serotonin reuptake inhibitor  . Nonspecific abnormal electrocardiogram (ECG)  (EKG) 2009  . GERD (gastroesophageal reflux disease)     rolaids  . Hot flashes   . Malignant neoplasm of breast (female), unspecified site   . Family history of malignant neoplasm of breast   . Arthritis     hands and feet  . Wears glasses     PAST SURGICAL HISTORY: Past Surgical History  Procedure Laterality Date  . Thyroidectomy, partial    . Cesarean section    . Endometrial ablation    . Dilation and curettage of uterus    . Laparoscopic assisted vaginal hysterectomy  02/07/2011    Procedure: LAPAROSCOPIC ASSISTED VAGINAL HYSTERECTOMY;  Surgeon: [name] [name];  Location: [facility];  Service: Gynecology;  Laterality: N/A;  . Abdominal hysterectomy  02/07/2011    Procedure: HYSTERECTOMY ABDOMINAL;  Surgeon: [name] [name];  Location: [facility];  Service: Gynecology;  Laterality: N/A;  . Breast lumpectomy with needle localization and axillary lymph node dissection Left 09/09/2013    Procedure: LEFT BREAST LUMPECTOMY WITH NEEDLE LOCALIZATION AND LEFT AXILLARY LYMPH NODE DISSECTION;  Surgeon: [name], MD;  Location: [facility];  Service: General;  Laterality: Left;  . Portacath placement Right 09/29/2013    Procedure: INSERTION PORT-A-CATH;  Surgeon: [name], MD;  Location: [facility];  Service: General;  Laterality: Right;    FAMILY HISTORY Family History  Problem Relation Age of Onset  . Angina Father   . Aortic aneurysm Father   . Dementia Father   . Arrhythmia Maternal Aunt     A fib  . Breast cancer Maternal Aunt 56    currently 11  . Hypertension Other   . Arrhythmia Other     Uncle- a fib  . Arrhythmia Maternal Aunt     A fib  . Breast cancer Maternal Aunt     dx 40s; currently 56s  . Breast cancer Mother     dx 87s. Died at 6  . Colon cancer Maternal Grandmother     dx 56s; died at 22  . Colon cancer Maternal Grandfather     dx 36s; died in 73s  . Cancer Paternal Uncle     unknown primary in early 50s  .  Breast cancer Maternal Aunt     dx 51s; died in 27s  The patient's father died from heart disease in the setting of dementia at the age of 52. The patient's mother died at the age of 78. She had been diagnosed with breast cancer at the age of 62. The patient is a single child. Her mother had 4 sisters, 3 of whom had breast cancer diagnosed at the age of 56, 6, and 40. There is also history of colorectal cancer on the maternal side of the family. BRCA testing negative.  GYNECOLOGIC HISTORY:   menarche age 1, first live birth age 51, the patient is Farber P2. She underwent a hysterectomy without salpingo-oophorectomy in 2012. 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 Her husband [name] [name] also works for [employer], running [employer].. Evorn Gong lives in [place] where he works as a Public relations account executive. Daughter Darrick Penna this in [place], where she works as an Engineering geologist in a  medical spa. The patient has no grandchildren. She is a Information systems manager.   ADVANCED DIRECTIVES: Not in place   HEALTH MAINTENANCE: History  Substance Use Topics  . Smoking status: Never Smoker   . Smokeless tobacco: Not on file  . Alcohol Use: Yes     Comment: drinks 1-2 beers a week     Colonoscopy:2010   PAP:2012, s/p hysterectomy.  Bone density:2012   Lipid panel: 11/05/2013 TC 219, HDL 41, LDL 137, TG 204  No Known Allergies  Current Outpatient Prescriptions  Medication Sig Dispense Refill  . calcium carbonate-magnesium hydroxide (ROLAIDS) 334 MG CHEW Chew 1 tablet by mouth 2 (two) times daily as needed. For indigestion     . Cholecalciferol (VITAMIN D) 2000 UNITS tablet Take 2,000 Units by mouth daily.    Marland Kitchen loratadine (CLARITIN) 10 MG tablet Take 10 mg by mouth daily.    . mupirocin nasal ointment (BACTROBAN) 2 % Place 1 application into the nose 2 (two) times daily. Use oinment in nostrils  twice daily x 5 days.    . naproxen sodium (ANAPROX) 220 MG tablet Take 220 mg by mouth 2 (two) times daily as needed.    . NON FORMULARY     . diclofenac (VOLTAREN) 75 MG EC tablet Take 75 mg by mouth as needed.    . gabapentin (NEURONTIN) 300 MG capsule Take 1 capsule (300 mg total) by mouth at bedtime. 30 capsule 6  . LORazepam (ATIVAN) 0.5 MG tablet Take 1 tablet (0.5 mg total) by mouth at bedtime. 30 tablet 0  . ondansetron (ZOFRAN) 8 MG tablet Take by mouth every 8 (eight) hours as needed for nausea or vomiting.    Marland Kitchen oxyCODONE (OXY IR/ROXICODONE) 5 MG immediate release tablet Take 1-2 tablets (5-10 mg total) by mouth every 6 (six) hours as needed. 30 tablet 0  . prochlorperazine (COMPAZINE) 10 MG tablet Take 10 mg by mouth every 6 (six) hours as needed for nausea or vomiting.     No current facility-administered medications for this visit.    OBJECTIVE: middle-aged white woman in no acute distress Filed Vitals:   03/13/14 1301  BP: 134/75  Pulse: 95  Temp: 98.1 F (36.7 C)  Resp: 18     Body mass index is 24.29 kg/(m^2).      ECOG FS:1 - Symptomatic but completely ambulatory Skin: warm, dry  HEENT: sclerae anicteric, conjunctivae pink, oropharynx clear. No thrush or mucositis.  Lymph Nodes: No cervical or supraclavicular lymphadenopathy  Lungs: clear to auscultation bilaterally, no rales, wheezes, or rhonci  Heart: regular rate and rhythm  Abdomen: round, soft, non tender, positive bowel sounds  Musculoskeletal: No focal spinal tenderness, left upper arm tenderness medially, no erythema, mild swelling when compared to right arm  Neuro: non focal, well oriented, positive affect  Breasts: deferred  LAB RESULTS:  CMP     Component Value Date/Time   NA 138 03/13/2014 1215   K 3.9 03/13/2014 1215   CO2 23 03/13/2014 1215   GLUCOSE 98 03/13/2014 1215   BUN 10.9 03/13/2014 1215   CREATININE 0.8 03/13/2014 1215   CALCIUM 9.3 03/13/2014 1215   PROT 6.6 03/13/2014 1215    ALBUMIN 3.9 03/13/2014 1215   AST 19 03/13/2014 1215   ALT 26 03/13/2014 1215   ALKPHOS 53 03/13/2014 1215   BILITOT 0.51 03/13/2014 1215   I No results found for: SPEP  Lab Results  Component Value Date   WBC 4.0 03/13/2014   NEUTROABS 2.9 03/13/2014  HGB 10.7* 03/13/2014   HCT 31.9* 03/13/2014   MCV 98.4 03/13/2014   PLT 369 03/13/2014      Chemistry      Component Value Date/Time   NA 138 03/13/2014 1215   K 3.9 03/13/2014 1215   CO2 23 03/13/2014 1215   BUN 10.9 03/13/2014 1215   CREATININE 0.8 03/13/2014 1215      Component Value Date/Time   CALCIUM 9.3 03/13/2014 1215   ALKPHOS 53 03/13/2014 1215   AST 19 03/13/2014 1215   ALT 26 03/13/2014 1215   BILITOT 0.51 03/13/2014 1215      STUDIES: No results found.   ASSESSMENT: 53 y.o. BRCA negative [place] woman status post left breast biopsy for 01/18/2014 showing invasive lobular carcinoma (E-cadherin negative), estrogen receptor 70% positive, progesterone receptor 70% positive, with an MIB-1 of 7%, and no HER-2 amplification  (1) ultrasound-guided biopsy of a suspicious lymph node in the left axilla 08/19/2012 was positive  (2) status post left lumpectomy and axillary lymph node dissection 09/09/2013 for a pT1c pN2, stage IIIa invasive lobular carcinoma, grade 1, repeat HER-2 again negative. [She had 8 of 23 axillary lymph nodes involved]  (4) Adjuvant chemotherapy consisting of doxorubicin and cyclophosphamide in dose dense fashion x4 completed 12/18/2013; being followed by paclitaxel weekly x 12  (5) radiation to follow chemotherapy  (6) antiestrogen therapy for 10 years to follow radiation   PLAN: The labs were reviewed in detail and were entirely stable. Anquinette will proceed with her 11th cycle of paclitaxel today.   We discussed her left upper arm pain. This is likely related to post surgical changes, as she has experienced this weakness before. I am writing orders to obtain a doppler of this area to  rule out DVT as the source of the pain and minimal swelling.   Desiraye will return next week for her 12th and final cycle of paclitaxel. She meets with [name]. Lisbeth Renshaw this upcoming Monday to discuss radiation. She understands and agrees with this plan. She knows the goal of treatment in her case is cure. She has been encouraged to call with any issues that might arise before her next visit here.    Marcelino Duster, [place] [phone] 03/13/2014

## 2014-03-13 NOTE — Patient Instructions (Signed)
Cancer Center Discharge Instructions for Patients Receiving Chemotherapy  Today you received the following chemotherapy agents Taxol  To help prevent nausea and vomiting after your treatment, we encourage you to take your nausea medication Compazine 10 mg every 6 hours or Zofran 8 mg every 8 hours as needed   If you develop nausea and vomiting that is not controlled by your nausea medication, call the clinic.   BELOW ARE SYMPTOMS THAT SHOULD BE REPORTED IMMEDIATELY:  *FEVER GREATER THAN 100.5 F  *CHILLS WITH OR WITHOUT FEVER  NAUSEA AND VOMITING THAT IS NOT CONTROLLED WITH YOUR NAUSEA MEDICATION  *UNUSUAL SHORTNESS OF BREATH  *UNUSUAL BRUISING OR BLEEDING  TENDERNESS IN MOUTH AND THROAT WITH OR WITHOUT PRESENCE OF ULCERS  *URINARY PROBLEMS  *BOWEL PROBLEMS  UNUSUAL RASH Items with * indicate a potential emergency and should be followed up as soon as possible.  Feel free to call the clinic you have any questions or concerns. The clinic phone number is [phone].    

## 2014-03-16 ENCOUNTER — Other Ambulatory Visit: Payer: Self-pay | Admitting: *Deleted

## 2014-03-16 ENCOUNTER — Telehealth: Payer: Self-pay | Admitting: Nurse Practitioner

## 2014-03-16 DIAGNOSIS — C50212 Malignant neoplasm of upper-inner quadrant of left female breast: Secondary | ICD-10-CM

## 2014-03-16 MED ORDER — LORAZEPAM 0.5 MG PO TABS: 0.5000 mg | ORAL_TABLET | Freq: Every day | ORAL | Status: DC

## 2014-03-16 NOTE — Telephone Encounter (Signed)
, °

## 2014-03-18 ENCOUNTER — Telehealth: Payer: Self-pay | Admitting: *Deleted

## 2014-03-18 ENCOUNTER — Ambulatory Visit (HOSPITAL_COMMUNITY)
Admission: RE | Admit: 2014-03-18 | Discharge: 2014-03-18 | Disposition: A | Discharge status: Home / Self Care | Payer: BC Managed Care – PPO | Source: Ambulatory Visit | Attending: Oncology | Admitting: Oncology

## 2014-03-18 DIAGNOSIS — M7989 Other specified soft tissue disorders: Secondary | ICD-10-CM | POA: Insufficient documentation

## 2014-03-18 DIAGNOSIS — M79602 Pain in left arm: Secondary | ICD-10-CM | POA: Diagnosis not present

## 2014-03-18 DIAGNOSIS — C50212 Malignant neoplasm of upper-inner quadrant of left female breast: Secondary | ICD-10-CM | POA: Diagnosis not present

## 2014-03-18 DIAGNOSIS — R609 Edema, unspecified: Secondary | ICD-10-CM | POA: Insufficient documentation

## 2014-03-18 NOTE — Progress Notes (Signed)
VASCULAR LAB PRELIMINARY  PRELIMINARY  PRELIMINARY  PRELIMINARY  Left upper extremity venous duplex completed.    Preliminary report:  Left :  No evidence of DVT or superficial thrombosis.    Emelin Dascenzo, RVS 03/18/2014, 9:56 AM

## 2014-03-18 NOTE — Telephone Encounter (Signed)
RESULTS GIVEN TO HEATHER FERRELL,NP. VERBAL ORDER AND READ BACK TO HEATHER FERRELL,NP- PT. MAY GO HOME AND KEEP HER SCHEDULED APPOINTMENT ON 03/20/14.

## 2014-03-19 ENCOUNTER — Encounter: Payer: Self-pay | Admitting: Radiation Oncology

## 2014-03-19 NOTE — Progress Notes (Signed)
Location of Breast Cancer: Left Breast 9 o'clock position   Histology per Pathology Report: Diagnosis 08/07/13: Breast, left, needle core biopsy, mass, 9 o' clock - INVASIVE MAMMARY CARCINOMA.- MAMMARY CARCINOMA IN SITU.  Receptor Status: ER( +  ), PR (  + ), Her2-neu (  - Proliferation marker KI-67: 7% )  Did patient present with symptoms (if so, please note symptoms) or was this found on screening mammography?: patient found small hard pea size under left axilla had mammogram B/L  & U/S 07/18/13   Past/Anticipated interventions by surgeon, if KIC:[id number] 09/09/13: 1. Breast, lumpectomy, Left - INVASIVE LOBULAR CARCINOMA, SEE COMMENT. - POSITIVE FOR LYMPH VASCULAR INVASION. - POSITIVE FOR PERINEURAL INVASION. - INVASIVE TUMOR IS 1 MM FROM NEAREST MARGIN (ANTERIOR). 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 - PREVIOUS BIOPSY SITE. - SEE TUMOR SYNOPTIC TEMPLATE BELOW. 2. Breast, lumpectomy, Left additional superior margin - LOBULAR NEOPLASIA (ATYPICAL LOBULAR HYPERPLASIA), SEE COMMENT. - MICROCALCIFICATIONS IDENTIFIED. 3. Lymph nodes, regional resection, Left axilla contents 1 of 4 FINAL for [name] [name], [name] ([id number]) Diagnosis - EIGHT TOTAL LYMPH NODES, POSITIVE FOR MAMMARY CARCINOMA (8/23) SEE COMMENT - SIX LYMPH NODES, POSITIVE FOR METASTATIC MAMMARY CARCINOMA. - INTRANODAL TUMOR DEPOSITS ARE 3 MM, 8 MM, 8 MM, 8MM, 9 MM AND 10 MM. - EXTRACAPSULAR TUMOR EXTENSION IDENTIFIED. - TWO LYMPH NODES, POSITIVE FOR MICROMETASTATIC MAMMARY CARCINOMA. - ONE LYMPH NODE, POSITIVE FOR ISOLATED TUMOR CELLS. Dr. Excell [name]      Past/Anticipated interventions by medical oncology, if any: Chemotherapy : 08/20/13:  Seen in multidisciplinary  Breast clinic , genetic testing May 2015-normal (BRCA neg) Dr. Jana [name], , 03/13/14 day 1 of cycle 11 of 12 planned weekly cycles of paclitaxel, [name] Jackson, NP, sees [name] Massed NP 03/20/14 with labs and Taxol  infusion, next appt with Dr.Magrinat 05/13/14  Lymphedema issues, if any:      Pain issues, if any:    SAFETY ISSUES:  Prior radiation? NO  Pacemaker/ICD? NO  Possible current pregnancy? NO  Is the patient on methotrexate? NO  Current Complaints / other details: Seen in multidisciplinary  Breast clinic 08/20/13  Married,  Menarche age 2, G8P2, 1st live birth age 1, Hysterectomy without salpingo-oophorectomy 2012,Birth control pills 3 years remotely,mother dx breast ca age 29,deceased age 26, 71/4 maternal aunts breast ca dx age 29,70, & 66. Colorectal cancer in maternal  Grandfather dx 70's,deceased 70's, paternal grandfather unknown primary cancer [name] [name] [name], [name] Gage, RN 03/19/2014,2:37 PM

## 2014-03-20 ENCOUNTER — Other Ambulatory Visit (HOSPITAL_BASED_OUTPATIENT_CLINIC_OR_DEPARTMENT_OTHER): Payer: BC Managed Care – PPO

## 2014-03-20 ENCOUNTER — Other Ambulatory Visit: Payer: BC Managed Care – PPO

## 2014-03-20 ENCOUNTER — Encounter: Payer: Self-pay | Admitting: Adult Health

## 2014-03-20 ENCOUNTER — Ambulatory Visit (HOSPITAL_BASED_OUTPATIENT_CLINIC_OR_DEPARTMENT_OTHER): Payer: BC Managed Care – PPO

## 2014-03-20 ENCOUNTER — Ambulatory Visit (HOSPITAL_BASED_OUTPATIENT_CLINIC_OR_DEPARTMENT_OTHER): Payer: BC Managed Care – PPO | Admitting: Adult Health

## 2014-03-20 ENCOUNTER — Ambulatory Visit: Payer: BC Managed Care – PPO

## 2014-03-20 VITALS — BP 133/77 | HR 98 | Temp 98.0°F | Resp 18 | Ht 66.0 in | Wt 152.9 lb

## 2014-03-20 VITALS — BP 134/68 | HR 99 | Temp 97.8°F

## 2014-03-20 DIAGNOSIS — C50212 Malignant neoplasm of upper-inner quadrant of left female breast: Secondary | ICD-10-CM

## 2014-03-20 DIAGNOSIS — D6481 Anemia due to antineoplastic chemotherapy: Secondary | ICD-10-CM

## 2014-03-20 DIAGNOSIS — C50812 Malignant neoplasm of overlapping sites of left female breast: Secondary | ICD-10-CM

## 2014-03-20 DIAGNOSIS — F411 Generalized anxiety disorder: Secondary | ICD-10-CM

## 2014-03-20 DIAGNOSIS — Z95828 Presence of other vascular implants and grafts: Secondary | ICD-10-CM

## 2014-03-20 DIAGNOSIS — Z170 Estrogen receptor positive status [ER+]: Secondary | ICD-10-CM

## 2014-03-20 DIAGNOSIS — C773 Secondary and unspecified malignant neoplasm of axilla and upper limb lymph nodes: Secondary | ICD-10-CM

## 2014-03-20 DIAGNOSIS — Z5111 Encounter for antineoplastic chemotherapy: Secondary | ICD-10-CM

## 2014-03-20 LAB — COMPREHENSIVE METABOLIC PANEL (CC13)
ALK PHOS: 53 U/L (ref 40–150)
ALT: 32 U/L (ref 0–55)
AST: 21 U/L (ref 5–34)
Albumin: 3.7 g/dL (ref 3.5–5.0)
Anion Gap: 11 mEq/L (ref 3–11)
BUN: 10.8 mg/dL (ref 7.0–26.0)
CALCIUM: 9.1 mg/dL (ref 8.4–10.4)
CHLORIDE: 108 meq/L (ref 98–109)
CO2: 24 mEq/L (ref 22–29)
CREATININE: 0.8 mg/dL (ref 0.6–1.1)
Glucose: 143 mg/dl — ABNORMAL HIGH (ref 70–140)
Potassium: 3.5 mEq/L (ref 3.5–5.1)
Sodium: 143 mEq/L (ref 136–145)
Total Bilirubin: 0.5 mg/dL (ref 0.20–1.20)
Total Protein: 6.4 g/dL (ref 6.4–8.3)

## 2014-03-20 LAB — CBC WITH DIFFERENTIAL/PLATELET
BASO%: 0.5 % (ref 0.0–2.0)
Basophils Absolute: 0 10*3/uL (ref 0.0–0.1)
EOS%: 1.6 % (ref 0.0–7.0)
Eosinophils Absolute: 0.1 10*3/uL (ref 0.0–0.5)
HCT: 33.2 % — ABNORMAL LOW (ref 34.8–46.6)
HGB: 11.1 g/dL — ABNORMAL LOW (ref 11.6–15.9)
LYMPH#: 0.6 10*3/uL — AB (ref 0.9–3.3)
LYMPH%: 17.6 % (ref 14.0–49.7)
MCH: 32.7 pg (ref 25.1–34.0)
MCHC: 33.4 g/dL (ref 31.5–36.0)
MCV: 97.9 fL (ref 79.5–101.0)
MONO#: 0.3 10*3/uL (ref 0.1–0.9)
MONO%: 7.7 % (ref 0.0–14.0)
NEUT%: 72.6 % (ref 38.4–76.8)
NEUTROS ABS: 2.6 10*3/uL (ref 1.5–6.5)
Platelets: 306 10*3/uL (ref 145–400)
RBC: 3.39 10*6/uL — ABNORMAL LOW (ref 3.70–5.45)
RDW: 14.8 % — AB (ref 11.2–14.5)
WBC: 3.6 10*3/uL — ABNORMAL LOW (ref 3.9–10.3)

## 2014-03-20 MED ORDER — FAMOTIDINE IN NACL 20-0.9 MG/50ML-% IV SOLN
INTRAVENOUS | Status: AC
  Filled 2014-03-20: qty 50

## 2014-03-20 MED ORDER — ONDANSETRON 8 MG/NS 50 ML IVPB
INTRAVENOUS | Status: AC
  Filled 2014-03-20: qty 8

## 2014-03-20 MED ORDER — HEPARIN SOD (PORK) LOCK FLUSH 100 UNIT/ML IV SOLN
500.0000 [IU] | Freq: Once | INTRAVENOUS | Status: AC | PRN
  Administered 2014-03-20: 500 [IU]
  Filled 2014-03-20: qty 5

## 2014-03-20 MED ORDER — DEXAMETHASONE SODIUM PHOSPHATE 10 MG/ML IJ SOLN
INTRAMUSCULAR | Status: AC
  Filled 2014-03-20: qty 1

## 2014-03-20 MED ORDER — FAMOTIDINE IN NACL 20-0.9 MG/50ML-% IV SOLN
20.0000 mg | Freq: Once | INTRAVENOUS | Status: AC
  Administered 2014-03-20: 20 mg via INTRAVENOUS

## 2014-03-20 MED ORDER — SODIUM CHLORIDE 0.9 % IV SOLN
Freq: Once | INTRAVENOUS | Status: AC
  Administered 2014-03-20: 11:00:00 via INTRAVENOUS

## 2014-03-20 MED ORDER — PACLITAXEL CHEMO INJECTION 300 MG/50ML
80.0000 mg/m2 | Freq: Once | INTRAVENOUS | Status: AC
  Administered 2014-03-20: 144 mg via INTRAVENOUS
  Filled 2014-03-20: qty 24

## 2014-03-20 MED ORDER — ONDANSETRON 8 MG/50ML IVPB (CHCC)
8.0000 mg | Freq: Once | INTRAVENOUS | Status: AC
  Administered 2014-03-20: 8 mg via INTRAVENOUS

## 2014-03-20 MED ORDER — SODIUM CHLORIDE 0.9 % IJ SOLN
10.0000 mL | INTRAMUSCULAR | Status: DC | PRN
  Administered 2014-03-20: 10 mL via INTRAVENOUS
  Filled 2014-03-20: qty 10

## 2014-03-20 MED ORDER — DEXAMETHASONE SODIUM PHOSPHATE 10 MG/ML IJ SOLN
10.0000 mg | Freq: Once | INTRAMUSCULAR | Status: AC
  Administered 2014-03-20: 10 mg via INTRAVENOUS

## 2014-03-20 MED ORDER — DIPHENHYDRAMINE HCL 50 MG/ML IJ SOLN
INTRAMUSCULAR | Status: AC
  Filled 2014-03-20: qty 1

## 2014-03-20 MED ORDER — SODIUM CHLORIDE 0.9 % IJ SOLN
10.0000 mL | INTRAMUSCULAR | Status: DC | PRN
  Administered 2014-03-20: 10 mL
  Filled 2014-03-20: qty 10

## 2014-03-20 MED ORDER — HEPARIN SOD (PORK) LOCK FLUSH 100 UNIT/ML IV SOLN
500.0000 [IU] | Freq: Once | INTRAVENOUS | Status: AC
  Administered 2014-03-20: 500 [IU] via INTRAVENOUS
  Filled 2014-03-20: qty 5

## 2014-03-20 MED ORDER — DIPHENHYDRAMINE HCL 50 MG/ML IJ SOLN
25.0000 mg | Freq: Once | INTRAMUSCULAR | Status: AC
  Administered 2014-03-20: 25 mg via INTRAVENOUS

## 2014-03-20 NOTE — Patient Instructions (Signed)
[facility] Discharge Instructions for Patients Receiving Chemotherapy  Today you received the following chemotherapy agents: Taxol.  To help prevent nausea and vomiting after your treatment, we encourage you to take your nausea medication as prescribed.   If you develop nausea and vomiting that is not controlled by your nausea medication, call the clinic.   BELOW ARE SYMPTOMS THAT SHOULD BE REPORTED IMMEDIATELY:  *FEVER GREATER THAN 100.5 F  *CHILLS WITH OR WITHOUT FEVER  NAUSEA AND VOMITING THAT IS NOT CONTROLLED WITH YOUR NAUSEA MEDICATION  *UNUSUAL SHORTNESS OF BREATH  *UNUSUAL BRUISING OR BLEEDING  TENDERNESS IN MOUTH AND THROAT WITH OR WITHOUT PRESENCE OF ULCERS  *URINARY PROBLEMS  *BOWEL PROBLEMS  UNUSUAL RASH Items with * indicate a potential emergency and should be followed up as soon as possible.  Feel free to call the clinic you have any questions or concerns. The clinic phone number is [phone].    

## 2014-03-20 NOTE — Patient Instructions (Signed)

## 2014-03-20 NOTE — Progress Notes (Signed)
[facility]  Telephone:(336) [phone] Fax:(336) [phone]     ID: [name] [name] OB: [birth year]  MR#: [id number]  PSS#:[id number]  PCP: Zelphia Cairo, MD GYN:  Zelphia Cairo SU: Glenna Fellows OTHER MD: Dorothy Puffer  CHIEF COMPLAINT: Locally advanced breast cancer for chemotherapy f/u. CURRENT TREATMENT: adjuvant chemotherapy  BREAST CANCER HISTORY:  [name] [name] is 53 years old and she first noted a change, like a small hard pea in her left axilla. She brought this to Dr. Vickey Sages attention and bilateral diagnostic mammography and left ultrasonography at the breast Center for 07/18/2013 showed a small area of asymmetry in the inner left breast measuring perhaps 5 mm which was not palpable by physical exam. In the right breast there was some loosely grouped calcifications spanning up to 0.7 cm. Ultrasound of the left breast confirmed an ill-defined area of shadowing at the 9:00 position measuring up to 1.3 cm. There was also a 4 mm circumscribed hypoechoic mass at the 10:00 position. This is felt to be probably benign. In the left axilla there were 2 lymph nodes with slightly increased cortical thickening, although the fatty hila were maintained. These were not related to the patient's feeling of a "pea like mass" in her left axilla, which had by then pretty much resolved.   On 08/07/2013 the patient underwent left breast needle core biopsy and this showed ([id number]) and invasive lobular carcinoma (E-cadherin negative) estrogen receptor 70% positive with strong staining intensity, progesterone receptor 70% positive and strong staining intensity, with a proliferation marker of 7% and no HER-2 amplification, the signals ratio being 0.94 in the [name] per cell 1.50.  Bilateral breast MRI 08/15/2013 found an irregular spiculated mass in the left breast measuring approximately 1.5 cm. There was no abnormal enhancement of the pectoralis. There were at least 2 left axillary lymph nodes and  showed focal cortical thickening. There was a single left internal mammary lymph node at the level of the biopsy-proven malignancy which was not pathologic in appearance. The right breast and axilla were benign.  Genetics testing may 2015 was normal and did not reveal a mutation in the genes tested: ATM, BARD1, BRCA1, BRCA2, BRIP1, CDH1, CHEK2, MRE11A, MUTYH, NBN, NF1, PALB2, PTEN, RAD50, RAD51C, RAD51D, and TP53. She is status post left lumpectomy and axillary lymph node dissection by Dr Johna Sheriff 09/09/2013 for a pT1c pN2, stage IIIa invasive lobular carcinoma, grade 1, repeat HER-2 again negative. Patient had 8/23 LN positive and also evidence of LVI (lymphovascular invasion) and PNI (perineural invasion).  Her subsequent history is as detailed below  INTERVAL HISTORY: [name] [name] is doing well today.  She is here for her final cycle of weekly Paclitaxel.  She is having some soreness and a bruised feeling underneath.  She has no hyperpigmentation or brittle nails that she has noticed.  She denies neuropathy in her fingertips.  She has intermittent neuropathy in her toes, but is unsure if it has always been there since prior to chemotherapy since she's always had problems with her feet.  She does have problems with her left arm.  She will occasionally get a stabbing pain in her shoulder blade or between her shoulder blades.  She did noticed a pulling feeling underneath her left arm, and a decrease in mobility.  She has simulation meeting planned with radiation oncology on Monday, 03/23/14.  REVIEW OF SYSTEMS: A 10 point review of systems was conducted and is otherwise negative except for what is noted above.     PAST MEDICAL HISTORY: Past Medical  History  Diagnosis Date  . Palpitations 2009    Improved; Secondary to premature ventricular contractions. No problems since-pt states was caring for ailing father, anxiety  . Anxiety disorder     Mild: treated by her gynecologist with selective serotonin  reuptake inhibitor  . Nonspecific abnormal electrocardiogram (ECG) (EKG) 2009  . GERD (gastroesophageal reflux disease)     rolaids  . Hot flashes   . Family history of malignant neoplasm of breast   . Arthritis     hands and feet  . Wears glasses   . Malignant neoplasm of breast (female), unspecified site 08/07/13    left breast invasive mammary ca in situ    PAST SURGICAL HISTORY: Past Surgical History  Procedure Laterality Date  . Thyroidectomy, partial    . Cesarean section    . Endometrial ablation    . Dilation and curettage of uterus    . Laparoscopic assisted vaginal hysterectomy  02/07/2011    Procedure: LAPAROSCOPIC ASSISTED VAGINAL HYSTERECTOMY;  Surgeon: [name];  Location: [facility];  Service: Gynecology;  Laterality: N/A;  . Abdominal hysterectomy  02/07/2011    Procedure: HYSTERECTOMY ABDOMINAL;  Surgeon: [name];  Location: [facility];  Service: Gynecology;  Laterality: N/A;  . Breast lumpectomy with needle localization and axillary lymph node dissection Left 09/09/2013    Procedure: LEFT BREAST LUMPECTOMY WITH NEEDLE LOCALIZATION AND LEFT AXILLARY LYMPH NODE DISSECTION;  Surgeon: [name], MD;  Location: [place];  Service: General;  Laterality: Left;  . Portacath placement Right 09/29/2013    Procedure: INSERTION PORT-A-CATH;  Surgeon: [name], MD;  Location: [place];  Service: General;  Laterality: Right;    FAMILY HISTORY Family History  Problem Relation Age of Onset  . Angina Father   . Aortic aneurysm Father   . Dementia Father   . Arrhythmia Maternal Aunt     A fib  . Breast cancer Maternal Aunt 56    currently 35  . Hypertension Other   . Arrhythmia Other     Uncle- a fib  . Arrhythmia Maternal Aunt     A fib  . Breast cancer Maternal Aunt     dx 43s; currently 9s  . Breast cancer Mother     dx 43s. Died at 45  . Colon cancer Maternal Grandmother     dx 68s; died at 15  . Colon  cancer Maternal Grandfather     dx 84s; died in 39s  . Cancer Paternal Uncle     unknown primary in early 65s  . Breast cancer Maternal Aunt     dx 41s; died in 3s  The patient's father died from heart disease in the setting of dementia at the age of 37. The patient's mother died at the age of 81. She had been diagnosed with breast cancer at the age of 40. The patient is a single child. Her mother had 4 sisters, 3 of whom had breast cancer diagnosed at the age of 56, 71, and 104. There is also history of colorectal cancer on the maternal side of the family. BRCA testing negative.  GYNECOLOGIC HISTORY:   menarche age 10, first live birth age 66, the patient is [name] [name] P2. She underwent a hysterectomy without salpingo-oophorectomy in 2012. 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 Her husband [name] also works for the  county, running their printing.. Evorn Gong lives in [place] where he works as a Public relations account executive. Daughter Darrick Penna this in [place], where she works as an Engineering geologist in a [employer]. The patient has no grandchildren. She is a Information systems manager.   ADVANCED DIRECTIVES: Not in place   HEALTH MAINTENANCE: History  Substance Use Topics  . Smoking status: Never Smoker   . Smokeless tobacco: Not on file  . Alcohol Use: Yes     Comment: drinks 1-2 beers a week     Colonoscopy:2010   PAP:2012, s/p hysterectomy.  Bone density:2012   Lipid panel: 11/05/2013 TC 219, HDL 41, LDL 137, TG 204  No Known Allergies  Current Outpatient Prescriptions  Medication Sig Dispense Refill  . calcium carbonate-magnesium hydroxide (ROLAIDS) 334 MG CHEW Chew 1 tablet by mouth 2 (two) times daily as needed. For indigestion     . Cholecalciferol (VITAMIN D) 2000 UNITS tablet Take 2,000 Units by mouth daily.    Marland Kitchen loratadine (CLARITIN) 10 MG tablet Take 10 mg by mouth daily.    Marland Kitchen  LORazepam (ATIVAN) 0.5 MG tablet Take 1 tablet (0.5 mg total) by mouth at bedtime. 30 tablet 0  . mupirocin nasal ointment (BACTROBAN) 2 % Place 1 application into the nose 2 (two) times daily. Use oinment in nostrils twice daily x 5 days.    . naproxen sodium (ANAPROX) 220 MG tablet Take 220 mg by mouth 2 (two) times daily as needed.    . NON FORMULARY     . ondansetron (ZOFRAN) 8 MG tablet Take by mouth every 8 (eight) hours as needed for nausea or vomiting.    . prochlorperazine (COMPAZINE) 10 MG tablet Take 10 mg by mouth every 6 (six) hours as needed for nausea or vomiting.    . diclofenac (VOLTAREN) 75 MG EC tablet Take 75 mg by mouth as needed.    . gabapentin (NEURONTIN) 300 MG capsule Take 1 capsule (300 mg total) by mouth at bedtime. 30 capsule 6  . oxyCODONE (OXY IR/ROXICODONE) 5 MG immediate release tablet Take 1-2 tablets (5-10 mg total) by mouth every 6 (six) hours as needed. 30 tablet 0   No current facility-administered medications for this visit.    OBJECTIVE: middle-aged white woman in no acute distress Filed Vitals:   03/20/14 0933  BP: 133/77  Pulse: 98  Temp: 98 F (36.7 C)  Resp: 18     Body mass index is 24.69 kg/(m^2).     GENERAL: Patient is a well appearing female in no acute distress HEENT:  Sclerae anicteric.  Oropharynx clear and moist. No ulcerations or evidence of oropharyngeal candidiasis. Neck is supple.  NODES:  No cervical, supraclavicular, or axillary lymphadenopathy palpated.  BREAST EXAM:  Deferred. LUNGS:  Clear to auscultation bilaterally.  No wheezes or rhonchi. HEART:  Regular rate and rhythm. No murmur appreciated. ABDOMEN:  Soft, nontender.  Positive, normoactive bowel sounds. No organomegaly palpated. EXTREMITIES:  No peripheral edema.   SKIN:  Clear with no obvious rashes or skin changes. No nail dyscrasia. NEURO:  Nonfocal. Well oriented.  Appropriate affect. ECOG FS:1 - Symptomatic but completely ambulatory  LAB RESULTS:  CMP       Component Value Date/Time   NA 138 03/13/2014 1215   K 3.9 03/13/2014 1215   CO2 23 03/13/2014 1215   GLUCOSE 98 03/13/2014 1215   BUN 10.9 03/13/2014 1215   CREATININE 0.8 03/13/2014 1215   CALCIUM 9.3 03/13/2014 1215   PROT 6.6 03/13/2014 1215  ALBUMIN 3.9 03/13/2014 1215   AST 19 03/13/2014 1215   ALT 26 03/13/2014 1215   ALKPHOS 53 03/13/2014 1215   BILITOT 0.51 03/13/2014 1215   I No results found for: SPEP  Lab Results  Component Value Date   WBC 3.6* 03/20/2014   NEUTROABS 2.6 03/20/2014   HGB 11.1* 03/20/2014   HCT 33.2* 03/20/2014   MCV 97.9 03/20/2014   PLT 306 03/20/2014      Chemistry      Component Value Date/Time   NA 138 03/13/2014 1215   K 3.9 03/13/2014 1215   CO2 23 03/13/2014 1215   BUN 10.9 03/13/2014 1215   CREATININE 0.8 03/13/2014 1215      Component Value Date/Time   CALCIUM 9.3 03/13/2014 1215   ALKPHOS 53 03/13/2014 1215   AST 19 03/13/2014 1215   ALT 26 03/13/2014 1215   BILITOT 0.51 03/13/2014 1215      STUDIES: No results found.   ASSESSMENT: 53 y.o. BRCA negative [place] woman status post left breast biopsy for 01/18/2014 showing invasive lobular carcinoma (E-cadherin negative), estrogen receptor 70% positive, progesterone receptor 70% positive, with an MIB-1 of 7%, and no HER-2 amplification  (1) ultrasound-guided biopsy of a suspicious lymph node in the left axilla 08/19/2012 was positive  (2) status post left lumpectomy and axillary lymph node dissection 09/09/2013 for a pT1c pN2, stage IIIa invasive lobular carcinoma, grade 1, repeat HER-2 again negative. [She had 8 of 23 axillary lymph nodes involved]  (4) Adjuvant chemotherapy consisting of doxorubicin and cyclophosphamide in dose dense fashion x4 completed 12/18/2013; being followed by paclitaxel weekly x 12  (5) radiation to follow chemotherapy  (6) antiestrogen therapy for 10 years to follow radiation   PLAN:  [name] [name] is doing well today.  Her CBC is stable  and she will proceed with her final cycle of weekly Paclitaxel.  She does have a mild treatment related anemia that we will monitor.   She and I discussed her left arm mobility changes and the possibility of sending her back to physical therapy for assistance and a plan.  She is planning on talking to radiation oncology about this and will let me know if she would like a referral.    [name] [name] will continue to apply tea tree oil to her nails daily.    She has f/u scheduled with Dr. Jana Hakim in January, 2016 following her radiation therapy.    She understands and agrees with this plan. She knows the goal of treatment in her case is cure. She has been encouraged to call with any issues that might arise before her next visit here.   I spent 25 minutes counseling the patient face to face.  The total time spent in the appointment was 30 minutes.    Minette Headland, [place] [phone] 03/20/2014

## 2014-03-23 ENCOUNTER — Ambulatory Visit
Admission: RE | Admit: 2014-03-23 | Discharge: 2014-03-23 | Disposition: A | Discharge status: Home / Self Care | Payer: BC Managed Care – PPO | Source: Ambulatory Visit | Attending: Radiation Oncology | Admitting: Radiation Oncology

## 2014-03-23 VITALS — BP 114/87 | HR 111 | Temp 98.2°F | Resp 10 | Wt 150.4 lb

## 2014-03-23 DIAGNOSIS — C50212 Malignant neoplasm of upper-inner quadrant of left female breast: Secondary | ICD-10-CM | POA: Insufficient documentation

## 2014-03-23 DIAGNOSIS — Z510 Encounter for antineoplastic radiation therapy: Secondary | ICD-10-CM | POA: Insufficient documentation

## 2014-03-23 NOTE — Progress Notes (Signed)
She is having moderate pain over her middle upper back.  She reports it is from stress. Reports pain is like a muscle ache and comes and goes. Pt takes naproxen which alleviates the discomfort. Pt complains of, Loss of Sleep, Fatigue and Generalized Weakness.  Pt left breast warm dry and incision lines well approximated and intact. Pt reports her left hand was swelling, with tenderness, painful along her ulnar nerve.  Doppler was performed with no diagnosis.  Discomfort and pulling sensation continues.   Pt reports in April of 2015 she had PET scan, reports she read the report and noted a nodule on her thyroid, but there hasn't been further investigation into this area.

## 2014-03-24 ENCOUNTER — Encounter: Payer: Self-pay | Admitting: Radiation Oncology

## 2014-03-24 NOTE — Progress Notes (Signed)
Radiation Oncology         [phone]) [phone] ________________________________  Name: [name] [name] MRN: [id number]  Date: 03/23/2014  DOB: [birth year]  Follow-Up Visit Note  CC: [name], MD  [name], MD  Diagnosis:    Breast cancer of upper-inner quadrant of left female breast   Staging form: Breast, AJCC 7th Edition     Clinical: Stage IIA (T1c, N1, cM0) - Unsigned       Staging comments: Staged at breast conference 08/20/13       Pathologic: Stage IIIA (T1c, N2, cM0) - Unsigned   Narrative:  The patient returns today for follow-up.  She has completed chemotherapy and now presents for further discussion and coordination of an anticipated course of radiation treatment. She states that, overall, she is doing well.  She tolerated chemotherapy quite well according to the patient. Some fatigue but no other difficulties such as nausea.                              ALLERGIES:  has No Known Allergies.  Meds: Current Outpatient Prescriptions  Medication Sig Dispense Refill  . calcium carbonate-magnesium hydroxide (ROLAIDS) 334 MG CHEW Chew 1 tablet by mouth 2 (two) times daily as needed. For indigestion     . Cholecalciferol (VITAMIN D) 2000 UNITS tablet Take 2,000 Units by mouth daily.    . diclofenac (VOLTAREN) 75 MG EC tablet Take 75 mg by mouth as needed.    . gabapentin (NEURONTIN) 300 MG capsule Take 1 capsule (300 mg total) by mouth at bedtime. (Patient not taking: Reported on 03/23/2014) 30 capsule 6  . loratadine (CLARITIN) 10 MG tablet Take 10 mg by mouth daily.    Marland Kitchen LORazepam (ATIVAN) 0.5 MG tablet Take 1 tablet (0.5 mg total) by mouth at bedtime. 30 tablet 0  . mupirocin nasal ointment (BACTROBAN) 2 % Place 1 application into the nose 2 (two) times daily. Use oinment in nostrils twice daily x 5 days.    . naproxen sodium (ANAPROX) 220 MG tablet Take 220 mg by mouth 2 (two) times daily as needed.    . NON FORMULARY     . ondansetron (ZOFRAN) 8 MG tablet Take by mouth  every 8 (eight) hours as needed for nausea or vomiting.    Marland Kitchen oxyCODONE (OXY IR/ROXICODONE) 5 MG immediate release tablet Take 1-2 tablets (5-10 mg total) by mouth every 6 (six) hours as needed. (Patient not taking: Reported on 03/23/2014) 30 tablet 0  . prochlorperazine (COMPAZINE) 10 MG tablet Take 10 mg by mouth every 6 (six) hours as needed for nausea or vomiting.     No current facility-administered medications for this encounter.    Physical Findings: The patient is in no acute distress. Patient is alert and oriented.  vitals were not taken for this visit..     Lab Findings: Lab Results  Component Value Date   WBC 3.6* 03/20/2014   HGB 11.1* 03/20/2014   HCT 33.2* 03/20/2014   MCV 97.9 03/20/2014   PLT 306 03/20/2014     Radiographic Findings: No results found.  Impression:    The patient has completed surgery and adjuvant chemotherapy. She is suitable to proceed with adjuvant radiotherapy at this time.  I once again discussed the rationale for radiation treatment in this setting. We discussed the potential benefit of radiation treatment as well as the possible side effects and risks. All of her questions were answered.  The patient wishes  to proceed with radiation treatment.  Plan:  The patient will be scheduled for a simulation in the near future such that we can proceed with radiation treatment planning. I anticipate treating the patient to the left breast/regional lymph nodes for approximately 6-1/2 weeks.  I spent 30 minutes with the patient today, the majority of which was spent counseling the patient on the diagnosis of cancer and coordinating care.  ------------------------------------------------  Jodelle Gross, MD, PhD

## 2014-04-03 ENCOUNTER — Ambulatory Visit: Payer: BC Managed Care – PPO | Admitting: Radiation Oncology

## 2014-04-08 ENCOUNTER — Encounter: Payer: Self-pay | Admitting: Oncology

## 2014-04-11 ENCOUNTER — Telehealth: Payer: Self-pay | Admitting: Oncology

## 2014-04-11 NOTE — Telephone Encounter (Signed)
lvm for pt regarding to 1.13 appt moved to 1.15 per MD on [facility]....mailed pt appt sched/and letter

## 2014-04-16 ENCOUNTER — Encounter: Payer: Self-pay | Admitting: *Deleted

## 2014-05-01 DIAGNOSIS — S70361A Insect bite (nonvenomous), right thigh, initial encounter: Secondary | ICD-10-CM

## 2014-05-01 DIAGNOSIS — W57XXXA Bitten or stung by nonvenomous insect and other nonvenomous arthropods, initial encounter: Secondary | ICD-10-CM

## 2014-05-01 HISTORY — DX: Insect bite (nonvenomous), right thigh, initial encounter: S70.361A

## 2014-05-01 HISTORY — DX: Bitten or stung by nonvenomous insect and other nonvenomous arthropods, initial encounter: W57.XXXA

## 2014-05-13 ENCOUNTER — Other Ambulatory Visit: Payer: BC Managed Care – PPO

## 2014-05-13 ENCOUNTER — Ambulatory Visit: Payer: BC Managed Care – PPO | Admitting: Oncology

## 2014-05-15 ENCOUNTER — Ambulatory Visit (HOSPITAL_BASED_OUTPATIENT_CLINIC_OR_DEPARTMENT_OTHER): Payer: BLUE CROSS/BLUE SHIELD | Admitting: Oncology

## 2014-05-15 ENCOUNTER — Telehealth: Payer: Self-pay | Admitting: Oncology

## 2014-05-15 ENCOUNTER — Other Ambulatory Visit (HOSPITAL_BASED_OUTPATIENT_CLINIC_OR_DEPARTMENT_OTHER): Payer: BLUE CROSS/BLUE SHIELD

## 2014-05-15 VITALS — BP 114/76 | HR 81 | Temp 97.8°F | Resp 18 | Ht 66.0 in | Wt 149.0 lb

## 2014-05-15 DIAGNOSIS — C50212 Malignant neoplasm of upper-inner quadrant of left female breast: Secondary | ICD-10-CM

## 2014-05-15 DIAGNOSIS — F411 Generalized anxiety disorder: Secondary | ICD-10-CM

## 2014-05-15 DIAGNOSIS — C773 Secondary and unspecified malignant neoplasm of axilla and upper limb lymph nodes: Secondary | ICD-10-CM

## 2014-05-15 DIAGNOSIS — C50812 Malignant neoplasm of overlapping sites of left female breast: Secondary | ICD-10-CM

## 2014-05-15 LAB — CBC WITH DIFFERENTIAL/PLATELET
BASO%: 0.5 % (ref 0.0–2.0)
BASOS ABS: 0 10*3/uL (ref 0.0–0.1)
EOS%: 3.3 % (ref 0.0–7.0)
Eosinophils Absolute: 0.1 10*3/uL (ref 0.0–0.5)
HCT: 38.4 % (ref 34.8–46.6)
HEMOGLOBIN: 12.9 g/dL (ref 11.6–15.9)
LYMPH#: 0.6 10*3/uL — AB (ref 0.9–3.3)
LYMPH%: 14.5 % (ref 14.0–49.7)
MCH: 31.2 pg (ref 25.1–34.0)
MCHC: 33.6 g/dL (ref 31.5–36.0)
MCV: 92.8 fL (ref 79.5–101.0)
MONO#: 0.4 10*3/uL (ref 0.1–0.9)
MONO%: 11 % (ref 0.0–14.0)
NEUT#: 2.8 10*3/uL (ref 1.5–6.5)
NEUT%: 70.7 % (ref 38.4–76.8)
Platelets: 222 10*3/uL (ref 145–400)
RBC: 4.14 10*6/uL (ref 3.70–5.45)
RDW: 13.3 % (ref 11.2–14.5)
WBC: 4 10*3/uL (ref 3.9–10.3)

## 2014-05-15 LAB — COMPREHENSIVE METABOLIC PANEL (CC13)
ALT: 28 U/L (ref 0–55)
ANION GAP: 9 meq/L (ref 3–11)
AST: 18 U/L (ref 5–34)
Albumin: 3.9 g/dL (ref 3.5–5.0)
Alkaline Phosphatase: 64 U/L (ref 40–150)
BUN: 13.5 mg/dL (ref 7.0–26.0)
CO2: 27 mEq/L (ref 22–29)
Calcium: 9.2 mg/dL (ref 8.4–10.4)
Chloride: 105 mEq/L (ref 98–109)
Creatinine: 0.9 mg/dL (ref 0.6–1.1)
EGFR: 77 mL/min/{1.73_m2} — AB (ref >90)
Glucose: 98 mg/dl (ref 70–140)
Potassium: 4.2 mEq/L (ref 3.5–5.1)
SODIUM: 141 meq/L (ref 136–145)
Total Bilirubin: 0.65 mg/dL (ref 0.20–1.20)
Total Protein: 6.6 g/dL (ref 6.4–8.3)

## 2014-05-15 MED ORDER — TAMOXIFEN CITRATE 20 MG PO TABS: 20.0000 mg | ORAL_TABLET | Freq: Every day | ORAL | Status: AC

## 2014-05-15 NOTE — Progress Notes (Signed)
[facility]  [phone] [phone]     ID: [name] OB: [birth year]  MR#: [id number]  PPJ#:[id number]  PCP: [name], MD GYN:  [name] SU: Excell Seltzer OTHER MD: [name]  CHIEF COMPLAINT: Locally advanced breast cancer for chemotherapy f/u. CURRENT TREATMENT: Completing radiation, starting tamoxifen  BREAST CANCER HISTORY: From the original intake note  Nyesha is 54 years old and she first noted a change, like a small hard pea in her left axilla. She brought this to Dr. [name] attention and bilateral diagnostic mammography and left ultrasonography at the [facility] for 07/18/2013 showed a small area of asymmetry in the inner left breast measuring perhaps 5 mm which was not palpable by physical exam. In the right breast there was some loosely grouped calcifications spanning up to 0.7 cm. Ultrasound of the left breast confirmed an ill-defined area of shadowing at the 9:00 position measuring up to 1.3 cm. There was also a 4 mm circumscribed hypoechoic mass at the 10:00 position. This is felt to be probably benign. In the left axilla there were 2 lymph nodes with slightly increased cortical thickening, although the fatty hila were maintained. These were not related to the patient's feeling of a "pea like mass" in her left axilla, which had by then pretty much resolved.   On 08/07/2013 the patient underwent left breast needle core biopsy and this showed ([id number]) and invasive lobular carcinoma (E-cadherin negative) estrogen receptor 70% positive with strong staining intensity, progesterone receptor 70% positive and strong staining intensity, with a proliferation marker of 7% and no HER-2 amplification, the signals ratio being 0.94 in the number per cell 1.50.  Bilateral breast MRI 08/15/2013 found an irregular spiculated mass in the left breast measuring approximately 1.5 cm. There was no abnormal enhancement of the pectoralis. There  were at least 2 left axillary lymph nodes and showed focal cortical thickening. There was a single left internal mammary lymph node at the level of the biopsy-proven malignancy which was not pathologic in appearance. The right breast and axilla were benign.  Genetics testing may 2015 was normal and did not reveal a mutation in the genes tested: ATM, BARD1, BRCA1, BRCA2, BRIP1, CDH1, CHEK2, MRE11A, MUTYH, NBN, NF1, PALB2, PTEN, RAD50, RAD51C, RAD51D, and TP53. She is status post left lumpectomy and axillary lymph node dissection by Dr Excell Seltzer 09/09/2013 for a pT1c pN2, stage IIIa invasive lobular carcinoma, grade 1, repeat HER-2 again negative. Patient had 8/23 LN positive and also evidence of LVI (lymphovascular invasion) and PNI (perineural invasion).  Her subsequent history is as detailed below  INTERVAL HISTORY: Jazmaine returns today for follow-up of her breast cancer accompanied by her husband [name]. Her hair is coming in very nicely, and today will be the first day that she does not wear a week to work. She is receiving radiation and [place], scheduled to be completed 06/01/2014. She has minimal fatigue but significant problems with itchy rash per she is using radio plaques for this, not so far with a good result.  REVIEW OF SYSTEMS: She started having hot flashes with back in 2013. They have not gotten any more intense or frequent. Vaginal dryness is not an issue. Insomnia is as before. She wakes up to 4 times a night, but she goes back to sleep easily. She's lost a few pounds, which she likes. She says her appetite is okay and her sense of taste is fine but she feels full early and her stomach feels a little queasy  at times. She may be developing some gastritis from her diclofenac and naproxen. We discussed that today. Sometimes she has discomfort between her shoulder blades. This is more a "tiredness" than anything else, and of course she has rash in the upper left chest. That's related to the  radiation. A detailed review of systems today was otherwise stable.     PAST MEDICAL HISTORY: Past Medical History  Diagnosis Date  . Palpitations 2009    Improved; Secondary to premature ventricular contractions. No problems since-pt states was caring for ailing father, anxiety  . Anxiety disorder     Mild: treated by her gynecologist with selective serotonin reuptake inhibitor  . Nonspecific abnormal electrocardiogram (ECG) (EKG) 2009  . GERD (gastroesophageal reflux disease)     rolaids  . Hot flashes   . Family history of malignant neoplasm of breast   . Arthritis     hands and feet  . Wears glasses   . Malignant neoplasm of breast (female), unspecified site 08/07/13    left breast invasive mammary ca in situ  . Anxiety     PAST SURGICAL HISTORY: Past Surgical History  Procedure Laterality Date  . Thyroidectomy, partial    . Cesarean section    . Endometrial ablation    . Dilation and curettage of uterus    . Laparoscopic assisted vaginal hysterectomy  02/07/2011    Procedure: LAPAROSCOPIC ASSISTED VAGINAL HYSTERECTOMY;  Surgeon: Zelphia Cairo;  Location: [facility];  Service: Gynecology;  Laterality: N/A;  . Abdominal hysterectomy  02/07/2011    Procedure: HYSTERECTOMY ABDOMINAL;  Surgeon: Zelphia Cairo;  Location: [facility];  Service: Gynecology;  Laterality: N/A;  . Breast lumpectomy with needle localization and axillary lymph node dissection Left 09/09/2013    Procedure: LEFT BREAST LUMPECTOMY WITH NEEDLE LOCALIZATION AND LEFT AXILLARY LYMPH NODE DISSECTION;  Surgeon: Mariella Saa, MD;  Location: [facility];  Service: General;  Laterality: Left;  . Portacath placement Right 09/29/2013    Procedure: INSERTION PORT-A-CATH;  Surgeon: Mariella Saa, MD;  Location: [facility];  Service: General;  Laterality: Right;    FAMILY HISTORY Family History  Problem Relation Age of Onset  . Angina Father   . Aortic aneurysm Father   . Dementia  Father   . Arrhythmia Maternal Aunt     A fib  . Breast cancer Maternal Aunt 56    currently 66  . Hypertension Other   . Arrhythmia Other     Uncle- a fib  . Arrhythmia Maternal Aunt     A fib  . Breast cancer Maternal Aunt     dx 24s; currently 41s  . Breast cancer Mother     dx 25s. Died at 77  . Colon cancer Maternal Grandmother     dx 19s; died at 68  . Colon cancer Maternal Grandfather     dx 28s; died in 31s  . Cancer Paternal Uncle     unknown primary in early 42s  . Breast cancer Maternal Aunt     dx 61s; died in 51s  The patient's father died from heart disease in the setting of dementia at the age of 63. The patient's mother died at the age of 14. She had been diagnosed with breast cancer at the age of 49. The patient is a single child. Her mother had 4 sisters, 3 of whom had breast cancer diagnosed at the age of 49, 75, and 27. There is also history of colorectal cancer on  the maternal side of the family. BRCA testing negative.  GYNECOLOGIC HISTORY:   menarche age 55, first live birth age 11, the patient is [place] P2. She underwent a hysterectomy without salpingo-oophorectomy in 2012. 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 Her husband [name] also works for [employer], running [employer].. Evorn Gong lives in [place] where he works as a Public relations account executive. Daughter Darrick Penna this in [place], where she works as an Engineering geologist in a [employer]. The patient has no grandchildren. She is a Information systems manager.   ADVANCED DIRECTIVES: Not in place   HEALTH MAINTENANCE: History  Substance Use Topics  . Smoking status: Never Smoker   . Smokeless tobacco: Not on file  . Alcohol Use: Yes     Comment: drinks 1-2 beers a week     Colonoscopy:2010   PAP:2012, s/p hysterectomy.  Bone density:2012   Lipid panel: 11/05/2013 TC 219, HDL 41, LDL 137, TG 204  No  Known Allergies  Current Outpatient Prescriptions  Medication Sig Dispense Refill  . calcium carbonate-magnesium hydroxide (ROLAIDS) 334 MG CHEW Chew 1 tablet by mouth 2 (two) times daily as needed. For indigestion     . Cholecalciferol (VITAMIN D) 2000 UNITS tablet Take 2,000 Units by mouth daily.    . diclofenac (VOLTAREN) 75 MG EC tablet Take 75 mg by mouth as needed.    . gabapentin (NEURONTIN) 300 MG capsule Take 1 capsule (300 mg total) by mouth at bedtime. (Patient not taking: Reported on 03/23/2014) 30 capsule 6  . loratadine (CLARITIN) 10 MG tablet Take 10 mg by mouth daily.    Marland Kitchen LORazepam (ATIVAN) 0.5 MG tablet Take 1 tablet (0.5 mg total) by mouth at bedtime. 30 tablet 0  . mupirocin nasal ointment (BACTROBAN) 2 % Place 1 application into the nose 2 (two) times daily. Use oinment in nostrils twice daily x 5 days.    . naproxen sodium (ANAPROX) 220 MG tablet Take 220 mg by mouth 2 (two) times daily as needed.    . NON FORMULARY     . ondansetron (ZOFRAN) 8 MG tablet Take by mouth every 8 (eight) hours as needed for nausea or vomiting.    Marland Kitchen oxyCODONE (OXY IR/ROXICODONE) 5 MG immediate release tablet Take 1-2 tablets (5-10 mg total) by mouth every 6 (six) hours as needed. (Patient not taking: Reported on 03/23/2014) 30 tablet 0  . prochlorperazine (COMPAZINE) 10 MG tablet Take 10 mg by mouth every 6 (six) hours as needed for nausea or vomiting.     No current facility-administered medications for this visit.    OBJECTIVE: middle-aged white woman who appears younger than stated age 24 Vitals:   05/15/14 1040  BP: 114/76  Pulse: 81  Temp: 97.8 F (36.6 C)  Resp: 18     Body mass index is 24.06 kg/(m^2).      ECOG FS:1 - Symptomatic but completely ambulatory   Sclerae unicteric, pupils equal and reactive Oropharynx clear and moist-- no thrush No cervical or supraclavicular adenopathy Lungs no rales or rhonchi Heart regular rate and rhythm Abd soft, nontender, positive  bowel sounds MSK no focal spinal tenderness, no upper extremity lymphedema Neuro: nonfocal, well oriented, appropriate affect Breasts: The right breast is unremarkable. The left breast is status post lumpectomy and is currently receiving radiation. There is minimal erythema in the breast itself. There is a significant palpable erythematous  confluent rash all around the edges of the radiation port. The rash is also present in the upper left back.    LAB RESULTS:  CMP     Component Value Date/Time   NA 143 03/20/2014 0915   K 3.5 03/20/2014 0915   CO2 24 03/20/2014 0915   GLUCOSE 143* 03/20/2014 0915   BUN 10.8 03/20/2014 0915   CREATININE 0.8 03/20/2014 0915   CALCIUM 9.1 03/20/2014 0915   PROT 6.4 03/20/2014 0915   ALBUMIN 3.7 03/20/2014 0915   AST 21 03/20/2014 0915   ALT 32 03/20/2014 0915   ALKPHOS 53 03/20/2014 0915   BILITOT 0.50 03/20/2014 0915   I No results found for: SPEP  Lab Results  Component Value Date   WBC 4.0 05/15/2014   NEUTROABS 2.8 05/15/2014   HGB 12.9 05/15/2014   HCT 38.4 05/15/2014   MCV 92.8 05/15/2014   PLT 222 05/15/2014      Chemistry      Component Value Date/Time   NA 143 03/20/2014 0915   K 3.5 03/20/2014 0915   CO2 24 03/20/2014 0915   BUN 10.8 03/20/2014 0915   CREATININE 0.8 03/20/2014 0915      Component Value Date/Time   CALCIUM 9.1 03/20/2014 0915   ALKPHOS 53 03/20/2014 0915   AST 21 03/20/2014 0915   ALT 32 03/20/2014 0915   BILITOT 0.50 03/20/2014 0915      STUDIES: No results found.   ASSESSMENT: 54 y.o. BRCA negative [place] woman status post left breast biopsy for 01/18/2014 showing invasive lobular carcinoma (E-cadherin negative), estrogen receptor 70% positive, progesterone receptor 70% positive, with an MIB-1 of 7%, and no HER-2 amplification  (1) ultrasound-guided biopsy of a suspicious lymph node in the left axilla 08/19/2012 was positive  (2) status post left lumpectomy and axillary lymph node  dissection 09/09/2013 for a pT1c pN2, stage IIIa invasive lobular carcinoma, grade 1, repeat HER-2 again negative. [She had 8 of 23 axillary lymph nodes involved]  (4) Adjuvant chemotherapy consisting of doxorubicin and cyclophosphamide in dose dense fashion x4 completed 12/18/2013;followed by paclitaxel weekly x 12 completed 03/20/2014  (5) radiation to be completed in [facility] 06/01/2014  (6) to start tamoxifen 06/14/2014  (a) the patient is status post simple hysterectomy, no salpingo-oophorectomy   PLAN:  Nicolet is tolerating radiation well, except for the rash. She is already using radio Plax for this. She will be done with that 06/01/2014. I usually wait at least 2 weeks before starting antiestrogen's so she will start tamoxifen 06/14/2014 or if she is not yet feeling well enough, she will started 06/30/2014  Today we spent approximately 40 minutes discussing the difference between anastrozole and tamoxifen. We also differentiated both those drugs from the common side effects of menopause itself. She has all this information in writing. She understands the possible toxicities, side effects and complications of tamoxifen.  The plan is going to be for tamoxifen to be taken for 5 years followed by anastrozole for 5 years. She will have a bone density through Dr. Woody Seller his office at their next visit. She understands of course that tamoxifen helps the bones. She has also been feeling a little bit "cold" and she may want to have her TSH checked when Dr. Hazeline Junker.  Otherwise she knows to call for any problems that may develop before her next visit here, which will be in April. Because of travel issues we will do lab work and mammography at the [facility] on the same day.  Chauncey Cruel, MD 05/15/2014

## 2014-05-15 NOTE — Addendum Note (Signed)
Addended by: Prentiss Bells on: 05/15/2014 05:21 PM   Modules accepted: Orders, Medications

## 2014-05-15 NOTE — Telephone Encounter (Signed)
, °

## 2014-05-22 ENCOUNTER — Telehealth: Payer: Self-pay | Admitting: Oncology

## 2014-05-22 NOTE — Telephone Encounter (Signed)
cld & left pt a message of r/s appt time & date per GM due to [facility]-mailed sch

## 2014-05-29 ENCOUNTER — Encounter: Payer: Self-pay | Admitting: Nurse Practitioner

## 2014-05-29 ENCOUNTER — Other Ambulatory Visit: Payer: Self-pay | Admitting: Nurse Practitioner

## 2014-07-06 ENCOUNTER — Encounter: Payer: Self-pay | Admitting: Oncology

## 2014-08-26 ENCOUNTER — Ambulatory Visit: Payer: BLUE CROSS/BLUE SHIELD | Admitting: Oncology

## 2014-08-26 ENCOUNTER — Other Ambulatory Visit: Payer: BLUE CROSS/BLUE SHIELD

## 2014-09-02 ENCOUNTER — Ambulatory Visit
Admission: RE | Admit: 2014-09-02 | Discharge: 2014-09-02 | Disposition: A | Discharge status: Home / Self Care | Payer: BLUE CROSS/BLUE SHIELD | Source: Ambulatory Visit | Attending: Oncology | Admitting: Oncology

## 2014-09-02 ENCOUNTER — Other Ambulatory Visit: Payer: Self-pay | Admitting: Oncology

## 2014-09-02 ENCOUNTER — Other Ambulatory Visit (HOSPITAL_BASED_OUTPATIENT_CLINIC_OR_DEPARTMENT_OTHER): Payer: BLUE CROSS/BLUE SHIELD

## 2014-09-02 ENCOUNTER — Ambulatory Visit (HOSPITAL_BASED_OUTPATIENT_CLINIC_OR_DEPARTMENT_OTHER): Payer: BLUE CROSS/BLUE SHIELD | Admitting: Oncology

## 2014-09-02 VITALS — BP 131/72 | HR 97 | Temp 98.0°F | Resp 18 | Ht 66.0 in | Wt 149.7 lb

## 2014-09-02 DIAGNOSIS — Z170 Estrogen receptor positive status [ER+]: Secondary | ICD-10-CM

## 2014-09-02 DIAGNOSIS — F411 Generalized anxiety disorder: Secondary | ICD-10-CM

## 2014-09-02 DIAGNOSIS — C50212 Malignant neoplasm of upper-inner quadrant of left female breast: Secondary | ICD-10-CM

## 2014-09-02 DIAGNOSIS — C50812 Malignant neoplasm of overlapping sites of left female breast: Secondary | ICD-10-CM

## 2014-09-02 DIAGNOSIS — Z853 Personal history of malignant neoplasm of breast: Secondary | ICD-10-CM

## 2014-09-02 DIAGNOSIS — C773 Secondary and unspecified malignant neoplasm of axilla and upper limb lymph nodes: Secondary | ICD-10-CM | POA: Diagnosis not present

## 2014-09-02 DIAGNOSIS — N6310 Unspecified lump in the right breast, unspecified quadrant: Secondary | ICD-10-CM

## 2014-09-02 LAB — COMPREHENSIVE METABOLIC PANEL (CC13)
ALK PHOS: 72 U/L (ref 40–150)
ALT: 31 U/L (ref 0–55)
AST: 21 U/L (ref 5–34)
Albumin: 3.7 g/dL (ref 3.5–5.0)
Anion Gap: 12 mEq/L — ABNORMAL HIGH (ref 3–11)
BILIRUBIN TOTAL: 0.47 mg/dL (ref 0.20–1.20)
BUN: 11.9 mg/dL (ref 7.0–26.0)
CO2: 24 mEq/L (ref 22–29)
Calcium: 8.5 mg/dL (ref 8.4–10.4)
Chloride: 106 mEq/L (ref 98–109)
Creatinine: 0.9 mg/dL (ref 0.6–1.1)
EGFR: 77 mL/min/{1.73_m2} — ABNORMAL LOW (ref >90)
Glucose: 144 mg/dl — ABNORMAL HIGH (ref 70–140)
Potassium: 3.7 mEq/L (ref 3.5–5.1)
SODIUM: 142 meq/L (ref 136–145)
TOTAL PROTEIN: 6.4 g/dL (ref 6.4–8.3)

## 2014-09-02 LAB — CBC WITH DIFFERENTIAL/PLATELET
BASO%: 0.5 % (ref 0.0–2.0)
Basophils Absolute: 0 10*3/uL (ref 0.0–0.1)
EOS%: 4.2 % (ref 0.0–7.0)
Eosinophils Absolute: 0.2 10*3/uL (ref 0.0–0.5)
HEMATOCRIT: 35.9 % (ref 34.8–46.6)
HGB: 12.3 g/dL (ref 11.6–15.9)
LYMPH%: 22.6 % (ref 14.0–49.7)
MCH: 31.9 pg (ref 25.1–34.0)
MCHC: 34.3 g/dL (ref 31.5–36.0)
MCV: 93 fL (ref 79.5–101.0)
MONO#: 0.3 10*3/uL (ref 0.1–0.9)
MONO%: 6.5 % (ref 0.0–14.0)
NEUT#: 2.7 10*3/uL (ref 1.5–6.5)
NEUT%: 66.2 % (ref 38.4–76.8)
Platelets: 223 10*3/uL (ref 145–400)
RBC: 3.86 10*6/uL (ref 3.70–5.45)
RDW: 13.1 % (ref 11.2–14.5)
WBC: 4 10*3/uL (ref 3.9–10.3)
lymph#: 0.9 10*3/uL (ref 0.9–3.3)

## 2014-09-02 MED ORDER — TAMOXIFEN CITRATE 20 MG PO TABS: 20.0000 mg | ORAL_TABLET | Freq: Every day | ORAL | Status: DC

## 2014-09-02 NOTE — Progress Notes (Signed)
[facility]  Telephone:([phone]) [phone] Fax:([phone]) [phone]     ID: [name] [name] OB: November 09, 1960  MR#: [id number]  ZJI#:[id number]  PCP: [name] [name]., MD GYN:  [name] [name] SU: [name] [name] OTHER MD: [name] [name]  CHIEF COMPLAINT: Locally advanced breast cancer for chemotherapy f/u. CURRENT TREATMENT: Completing radiation, starting tamoxifen  BREAST CANCER HISTORY: From the original intake note  [name] [name] is 54 years old and she first noted a change, like a small hard pea in her left axilla. She brought this to [name] [name] attention and bilateral diagnostic mammography and left ultrasonography at the [facility] Center for 07/18/2013 showed a small area of asymmetry in the inner left breast measuring perhaps 5 mm which was not palpable by physical exam. In the right breast there was some loosely grouped calcifications spanning up to 0.7 cm. Ultrasound of the left breast confirmed an ill-defined area of shadowing at the 9:00 position measuring up to 1.3 cm. There was also a 4 mm circumscribed hypoechoic mass at the 10:00 position. This is felt to be probably benign. In the left axilla there were 2 lymph nodes with slightly increased cortical thickening, although the fatty hila were maintained. These were not related to the patient's feeling of a "pea like mass" in her left axilla, which had by then pretty much resolved.   On 08/07/2013 the patient underwent left breast needle core biopsy and this showed ([id number]) and invasive lobular carcinoma (E-cadherin negative) estrogen receptor 70% positive with strong staining intensity, progesterone receptor 70% positive and strong staining intensity, with a proliferation marker of 7% and no HER-2 amplification, the signals ratio being 0.94 in the number per cell 1.50.  Bilateral breast MRI 08/15/2013 found an irregular spiculated mass in the left breast measuring approximately 1.5 cm. There was no abnormal enhancement of the pectoralis. There  were at least 2 left axillary lymph nodes and showed focal cortical thickening. There was a single left internal mammary lymph node at the level of the biopsy-proven malignancy which was not pathologic in appearance. The right breast and axilla were benign.  Genetics testing may 2015 was normal and did not reveal a mutation in the genes tested: ATM, BARD1, BRCA1, BRCA2, BRIP1, CDH1, CHEK2, MRE11A, MUTYH, NBN, NF1, PALB2, PTEN, RAD50, RAD51C, RAD51D, and TP53. She is status post left lumpectomy and axillary lymph node dissection by Dr [name] [name] 09/09/2013 for a pT1c pN2, stage IIIa invasive lobular carcinoma, grade 1, repeat HER-2 again negative. Patient had 8/23 LN positive and also evidence of LVI (lymphovascular invasion) and PNI (perineural invasion).  Her subsequent history is as detailed below  INTERVAL HISTORY: [name] returns today for follow-up of her breast cancer. . She completed her radiation treatments in [place] after her last visit here and she started tamoxifen the first week in April. She is tolerating it well, with no change in the mild hot flashes she was experiencing previously.  She went for routine mammography and in the contralateral, right breast there was an area of architectural distortion which was not palpable and was not [name] on ultrasonography. She scheduled for stereotactic biopsy next week after going off aspirin for a week.  REVIEW OF SYSTEMS: Her hair has come and quite curly and she is very delighted by that. She exercises mostly by doing gardening and yoga once a week. There have been no unusual headaches, visual changes, nausea, vomiting, cough, phlegm production, Percy, shortness of breath, chest pain or pressure, or change in bowel or bladder habits. A detailed review of  systems today was otherwise negative.   PAST MEDICAL HISTORY: Past Medical History  Diagnosis Date  . Palpitations 2009    Improved; Secondary to premature ventricular contractions. No problems  since-pt states was caring for ailing father, anxiety  . Anxiety disorder     Mild: treated by her gynecologist with selective serotonin reuptake inhibitor  . Nonspecific abnormal electrocardiogram (ECG) (EKG) 2009  . GERD (gastroesophageal reflux disease)     rolaids  . Hot flashes   . Family history of malignant neoplasm of breast   . Arthritis     hands and feet  . Wears glasses   . Malignant neoplasm of breast (female), unspecified site 08/07/13    left breast invasive mammary ca in situ  . Anxiety     PAST SURGICAL HISTORY: Past Surgical History  Procedure Laterality Date  . Thyroidectomy, partial    . Cesarean section    . Endometrial ablation    . Dilation and curettage of uterus    . Laparoscopic assisted vaginal hysterectomy  02/07/2011    Procedure: LAPAROSCOPIC ASSISTED VAGINAL HYSTERECTOMY;  Surgeon: Gretchen Adkins;  Location: [facility];  Service: Gynecology;  Laterality: N/A;  . Abdominal hysterectomy  02/07/2011    Procedure: HYSTERECTOMY ABDOMINAL;  Surgeon: Gretchen Adkins;  Location: [facility];  Service: Gynecology;  Laterality: N/A;  . Breast lumpectomy with needle localization and axillary lymph node dissection Left 09/09/2013    Procedure: LEFT BREAST LUMPECTOMY WITH NEEDLE LOCALIZATION AND LEFT AXILLARY LYMPH NODE DISSECTION;  Surgeon: Benjamin T Hoxworth, MD;  Location: [facility];  Service: General;  Laterality: Left;  . Portacath placement Right 09/29/2013    Procedure: INSERTION PORT-A-CATH;  Surgeon: Benjamin T Hoxworth, MD;  Location: [facility];  Service: General;  Laterality: Right;    FAMILY HISTORY Family History  Problem Relation Age of Onset  . Angina Father   . Aortic aneurysm Father   . Dementia Father   . Arrhythmia Maternal Aunt     A fib  . Breast cancer Maternal Aunt 56    currently 73  . Hypertension Other   . Arrhythmia Other     Uncle- a fib  . Arrhythmia Maternal Aunt     A fib  . Breast cancer Maternal Aunt      dx 70s; currently 80s  . Breast cancer Mother     dx 70s. Died at 93  . Colon cancer Maternal Grandmother     dx 70s; died at 90  . Colon cancer Maternal Grandfather     dx 70s; died in 70s  . Cancer Paternal Uncle     unknown primary in early 60s  . Breast cancer Maternal Aunt     dx 50s; died in 60s  The patient's father died from heart disease in the setting of dementia at the age of 82. The patient's mother died at the age of 93. She had been diagnosed with breast cancer at the age of 82. The patient is a single child. Her mother had 4 sisters, 3 of whom had breast cancer diagnosed at the age of 56, 70, and 50. There is also history of colorectal cancer on the maternal side of the family. BRCA testing negative.  GYNECOLOGIC HISTORY:   menarche age 11, first live birth age 22, the patient is GX P2. She underwent a hysterectomy without salpingo-oophorectomy in 2012. 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 Her husband [name] also works for [employer], running [employer].. Evorn Gong lives in [place] where he works as a Public relations account executive. Daughter Darrick Penna this in [place], where she works as an Engineering geologist in a [employer]. The patient has no grandchildren. She is a Information systems manager.   ADVANCED DIRECTIVES: Not in place   HEALTH MAINTENANCE: History  Substance Use Topics  . Smoking status: Never Smoker   . Smokeless tobacco: Not on file  . Alcohol Use: Yes     Comment: drinks 1-2 beers a week     Colonoscopy:2010   PAP:2012, s/p hysterectomy.  Bone density:2012   Lipid panel: 11/05/2013 TC 219, HDL 41, LDL 137, TG 204  No Known Allergies  Current Outpatient Prescriptions  Medication Sig Dispense Refill  . calcium carbonate-magnesium hydroxide (ROLAIDS) 334 MG CHEW Chew 1 tablet by mouth 2 (two) times daily as needed. For indigestion     .  Cholecalciferol (VITAMIN D) 2000 UNITS tablet Take 2,000 Units by mouth daily.    . diclofenac (VOLTAREN) 75 MG EC tablet Take 75 mg by mouth as needed.    . naproxen sodium (ANAPROX) 220 MG tablet Take 220 mg by mouth 2 (two) times daily as needed.    . NON FORMULARY     . Wound Cleansers (RADIAPLEX EX) Apply topically.     No current facility-administered medications for this visit.    OBJECTIVE: middle-aged white woman who appears well Filed Vitals:   09/02/14 1451  BP: 131/72  Pulse: 97  Temp: 98 F (36.7 C)  Resp: 18     Body mass index is 24.17 kg/(m^2).      ECOG FS:0 - Asymptomatic   Sclerae unicteric, EOMs intact Oropharynx clear, good dentition No cervical or supraclavicular adenopathy Lungs no rales or rhonchi Heart regular rate and rhythm Abd soft, nontender, positive bowel sounds MSK no focal spinal tenderness, no upper extremity lymphedema Neuro: nonfocal, well oriented, appropriate affect Breasts: The right breast is unremarkable. The left breast is status post lumpectomy and radiation. It has healed very nicely and the cosmetic result is excellent. There is no evidence of disease recurrence. The left axilla is benign.    LAB RESULTS:  CMP     Component Value Date/Time   NA 141 05/15/2014 1023   K 4.2 05/15/2014 1023   CO2 27 05/15/2014 1023   GLUCOSE 98 05/15/2014 1023   BUN 13.5 05/15/2014 1023   CREATININE 0.9 05/15/2014 1023   CALCIUM 9.2 05/15/2014 1023   PROT 6.6 05/15/2014 1023   ALBUMIN 3.9 05/15/2014 1023   AST 18 05/15/2014 1023   ALT 28 05/15/2014 1023   ALKPHOS 64 05/15/2014 1023   BILITOT 0.65 05/15/2014 1023   I No results found for: SPEP  Lab Results  Component Value Date   WBC 4.0 09/02/2014   NEUTROABS 2.7 09/02/2014   HGB 12.3 09/02/2014   HCT 35.9 09/02/2014   MCV 93.0 09/02/2014   PLT 223 09/02/2014      Chemistry      Component Value Date/Time   NA 141 05/15/2014 1023   K 4.2 05/15/2014 1023   CO2 27 05/15/2014  1023   BUN 13.5 05/15/2014 1023   CREATININE 0.9 05/15/2014 1023      Component Value Date/Time   CALCIUM 9.2 05/15/2014 1023   ALKPHOS 64 05/15/2014 1023   AST 18 05/15/2014 1023   ALT 28 05/15/2014 1023   BILITOT 0.65 05/15/2014  1023      STUDIES: Us Breast Ltd Uni Right Inc Axilla  09/02/2014   CLINICAL DATA:  Status post left lumpectomy, chemotherapy and radiation therapy for breast cancer in 2015. Previously evaluated probably benign right breast calcifications.  EXAM: DIGITAL DIAGNOSTIC BILATERAL MAMMOGRAM WITH 3D TOMOSYNTHESIS WITH CAD  ULTRASOUND RIGHT BREAST  COMPARISON:  Previous examinations, including the patient's previous breast MR and PET-CT.  ACR Breast Density Category c: The breast tissue is heterogeneously dense, which may obscure small masses.  FINDINGS: Interval post lumpectomy and postradiation changes on the left. There has been no significant change in multiple small groups of tiny, punctate, rounded calcifications in the right breast. On today's 3D images, there is a small area of architectural distortion in the medial right breast, slightly superiorly, in the middle third.  Mammographic images were processed with CAD.  On physical exam, no mass is palpable in the medial right breast or right axilla.  Targeted ultrasound is performed, showing normal appearing breast tissue in the medial right breast and no abnormal appearing right axillary lymph nodes.  IMPRESSION: Small area of architectural distortion in the medial right breast, slightly superiorly. Differential considerations include radial scar and a small, sonographically occult malignancy.  RECOMMENDATION: 1. Right breast 3D tomographic stereotactic guided core needle biopsy. This has been scheduled for 9 a.m. on 09/08/2014. The patient is going to discontinue taking her daily 81 mg aspirin for 5 days prior to the biopsy. 2. If the right breast biopsy is negative for malignancy, a followup right diagnostic mammogram would  be recommended in six months to continue surveillance of the probably benign right breast calcifications.  I have discussed the findings and recommendations with the patient. Results were also provided in writing at the conclusion of the visit. If applicable, a reminder letter will be sent to the patient regarding the next appointment.  BI-RADS CATEGORY  4: Suspicious.   Electronically Signed   By: Steven  Reid M.D.   On: 09/02/2014 13:02   Mm Diag Breast Tomo Bilateral  09/02/2014   CLINICAL DATA:  Status post left lumpectomy, chemotherapy and radiation therapy for breast cancer in 2015. Previously evaluated probably benign right breast calcifications.  EXAM: DIGITAL DIAGNOSTIC BILATERAL MAMMOGRAM WITH 3D TOMOSYNTHESIS WITH CAD  ULTRASOUND RIGHT BREAST  COMPARISON:  Previous examinations, including the patient's previous breast MR and PET-CT.  ACR Breast Density Category c: The breast tissue is heterogeneously dense, which may obscure small masses.  FINDINGS: Interval post lumpectomy and postradiation changes on the left. There has been no significant change in multiple small groups of tiny, punctate, rounded calcifications in the right breast. On today's 3D images, there is a small area of architectural distortion in the medial right breast, slightly superiorly, in the middle third.  Mammographic images were processed with CAD.  On physical exam, no mass is palpable in the medial right breast or right axilla.  Targeted ultrasound is performed, showing normal appearing breast tissue in the medial right breast and no abnormal appearing right axillary lymph nodes.  IMPRESSION: Small area of architectural distortion in the medial right breast, slightly superiorly. Differential considerations include radial scar and a small, sonographically occult malignancy.  RECOMMENDATION: 1. Right breast 3D tomographic stereotactic guided core needle biopsy. This has been scheduled for 9 a.m. on 09/08/2014. The patient is going  to discontinue taking her daily 81 mg aspirin for 5 days prior to the biopsy. 2. If the right breast biopsy is negative for malignancy, a followup right diagnostic   mammogram would be recommended in six months to continue surveillance of the probably benign right breast calcifications.  I have discussed the findings and recommendations with the patient. Results were also provided in writing at the conclusion of the visit. If applicable, a reminder letter will be sent to the patient regarding the next appointment.  BI-RADS CATEGORY  4: Suspicious.   Electronically Signed   By: Claudie Revering M.D.   On: 09/02/2014 13:02     ASSESSMENT: 54 y.o. BRCA negative [place] woman status post left breast biopsy for 01/18/2014 showing invasive lobular carcinoma (E-cadherin negative), estrogen receptor 70% positive, progesterone receptor 70% positive, with an MIB-1 of 7%, and no HER-2 amplification  (1) ultrasound-guided biopsy of a suspicious lymph node in the left axilla 08/19/2012 was positive  (2) status post left lumpectomy and axillary lymph node dissection 09/09/2013 for a pT1c pN2, stage IIIa invasive lobular carcinoma, grade 1, repeat HER-2 again negative. [She had 8 of 23 axillary lymph nodes involved]  (4) Adjuvant chemotherapy consisting of doxorubicin and cyclophosphamide in dose dense fashion x4 completed 12/18/2013;followed by paclitaxel weekly x 12 completed 03/20/2014  (5) radiation completed in [facility] 07/01/2014  (6) started tamoxifen 08/01/2014  (a) the patient is status post simple hysterectomy, no salpingo-oophorectomy   PLAN: Lucile is tolerating the tamoxifen well, and the plan will be to continue that for 5 years and then switch to an aromatase inhibitor for an additional 5 years.  I am hopeful the change noted in her right breast will proved to be benign. It could well be a radial scar, ductal carcinoma in situ, or even possibly microinvasive disease. We will now within 2 weeks.  She  is doing very well overall but is not exercising as much as I would like. I did suggest she consider walks or perhaps a tape at home on a regular basis. I think in the long run this would have any normocytic impact on her health, although she is generally active and certainly is not overweight.  Tentatively I am making her a return appointment here in 3 months, but of course if we do find something of concern in the right breast I would be seeing her earlier than that.  Chauncey Cruel, MD 09/02/2014

## 2014-09-03 ENCOUNTER — Other Ambulatory Visit: Payer: Self-pay | Admitting: Oncology

## 2014-09-03 DIAGNOSIS — N6310 Unspecified lump in the right breast, unspecified quadrant: Secondary | ICD-10-CM

## 2014-09-03 DIAGNOSIS — Z853 Personal history of malignant neoplasm of breast: Secondary | ICD-10-CM

## 2014-09-08 ENCOUNTER — Other Ambulatory Visit: Payer: Self-pay | Admitting: Oncology

## 2014-09-08 ENCOUNTER — Ambulatory Visit
Admission: RE | Admit: 2014-09-08 | Discharge: 2014-09-08 | Disposition: A | Discharge status: Home / Self Care | Payer: BLUE CROSS/BLUE SHIELD | Source: Ambulatory Visit | Attending: Oncology | Admitting: Oncology

## 2014-09-08 DIAGNOSIS — Z853 Personal history of malignant neoplasm of breast: Secondary | ICD-10-CM

## 2014-09-08 DIAGNOSIS — N6310 Unspecified lump in the right breast, unspecified quadrant: Secondary | ICD-10-CM

## 2014-09-16 ENCOUNTER — Telehealth: Payer: Self-pay | Admitting: *Deleted

## 2014-09-16 NOTE — Telephone Encounter (Signed)
This RN returned call to pt per concern with new onset of elevated liver enzymes ( normal per reading 09/02/2014 per this office ) and recent exposure to a tick.  Per discussion with pt she states she is currently in her primary care's office now waiting to see the PA.  This RN discussed above with her including need to make sure PA is aware of above and normal liver readings per this office in early May.  [name] [name] verbalized understanding.  " I just wanted Dr Jana Hakim to be aware of this too "  This RN informed pt MD would be made aware.

## 2014-09-16 NOTE — Telephone Encounter (Signed)
TC from patient today. She has seen her PCP today as she was not feeling well.  PCP noted elevated liver enzymes-AST 131 and ALT 136.  Pt also has UTI and they found a tic on her. Concern is about elevated AST/ALT.  She would like your opinion on this please. Cell # [phone]

## 2014-09-17 ENCOUNTER — Encounter: Payer: Self-pay | Admitting: Nurse Practitioner

## 2014-09-17 ENCOUNTER — Other Ambulatory Visit: Payer: Self-pay | Admitting: Nurse Practitioner

## 2014-09-24 ENCOUNTER — Other Ambulatory Visit: Payer: Self-pay | Admitting: General Surgery

## 2014-09-24 DIAGNOSIS — N6489 Other specified disorders of breast: Secondary | ICD-10-CM

## 2014-09-25 NOTE — Telephone Encounter (Signed)
Printed for provider review.

## 2014-09-29 ENCOUNTER — Other Ambulatory Visit: Payer: Self-pay | Admitting: General Surgery

## 2014-09-29 DIAGNOSIS — N6489 Other specified disorders of breast: Secondary | ICD-10-CM

## 2014-10-02 ENCOUNTER — Encounter (HOSPITAL_BASED_OUTPATIENT_CLINIC_OR_DEPARTMENT_OTHER): Payer: Self-pay | Admitting: *Deleted

## 2014-10-06 ENCOUNTER — Ambulatory Visit
Admission: RE | Admit: 2014-10-06 | Discharge: 2014-10-06 | Disposition: A | Discharge status: Home / Self Care | Payer: BLUE CROSS/BLUE SHIELD | Source: Ambulatory Visit | Attending: General Surgery | Admitting: General Surgery

## 2014-10-06 DIAGNOSIS — N6489 Other specified disorders of breast: Secondary | ICD-10-CM

## 2014-10-07 ENCOUNTER — Encounter (HOSPITAL_BASED_OUTPATIENT_CLINIC_OR_DEPARTMENT_OTHER)
Admission: RE | Disposition: A | Discharge status: Home / Self Care | Payer: Self-pay | Source: Ambulatory Visit | Attending: General Surgery

## 2014-10-07 ENCOUNTER — Ambulatory Visit
Admission: RE | Admit: 2014-10-07 | Discharge: 2014-10-07 | Disposition: A | Discharge status: Home / Self Care | Payer: BLUE CROSS/BLUE SHIELD | Source: Ambulatory Visit | Attending: General Surgery | Admitting: General Surgery

## 2014-10-07 ENCOUNTER — Ambulatory Visit (HOSPITAL_BASED_OUTPATIENT_CLINIC_OR_DEPARTMENT_OTHER): Payer: BLUE CROSS/BLUE SHIELD | Admitting: Anesthesiology

## 2014-10-07 ENCOUNTER — Ambulatory Visit (HOSPITAL_BASED_OUTPATIENT_CLINIC_OR_DEPARTMENT_OTHER)
Admission: RE | Admit: 2014-10-07 | Discharge: 2014-10-07 | Disposition: A | Discharge status: Home / Self Care | Payer: BLUE CROSS/BLUE SHIELD | Source: Ambulatory Visit | Attending: General Surgery | Admitting: General Surgery

## 2014-10-07 ENCOUNTER — Encounter (HOSPITAL_BASED_OUTPATIENT_CLINIC_OR_DEPARTMENT_OTHER): Payer: Self-pay | Admitting: *Deleted

## 2014-10-07 DIAGNOSIS — K219 Gastro-esophageal reflux disease without esophagitis: Secondary | ICD-10-CM | POA: Insufficient documentation

## 2014-10-07 DIAGNOSIS — N6489 Other specified disorders of breast: Secondary | ICD-10-CM

## 2014-10-07 DIAGNOSIS — L905 Scar conditions and fibrosis of skin: Secondary | ICD-10-CM | POA: Diagnosis present

## 2014-10-07 DIAGNOSIS — Z90710 Acquired absence of both cervix and uterus: Secondary | ICD-10-CM | POA: Diagnosis not present

## 2014-10-07 DIAGNOSIS — N62 Hypertrophy of breast: Secondary | ICD-10-CM | POA: Diagnosis not present

## 2014-10-07 DIAGNOSIS — Z9011 Acquired absence of right breast and nipple: Secondary | ICD-10-CM | POA: Insufficient documentation

## 2014-10-07 DIAGNOSIS — E780 Pure hypercholesterolemia: Secondary | ICD-10-CM | POA: Insufficient documentation

## 2014-10-07 DIAGNOSIS — C50911 Malignant neoplasm of unspecified site of right female breast: Secondary | ICD-10-CM | POA: Diagnosis not present

## 2014-10-07 DIAGNOSIS — M1990 Unspecified osteoarthritis, unspecified site: Secondary | ICD-10-CM | POA: Insufficient documentation

## 2014-10-07 HISTORY — PX: BREAST EXCISIONAL BIOPSY: SUR124

## 2014-10-07 HISTORY — PX: BREAST LUMPECTOMY WITH RADIOACTIVE SEED LOCALIZATION: SHX6424

## 2014-10-07 LAB — POCT HEMOGLOBIN-HEMACUE: HEMOGLOBIN: 12.2 g/dL (ref 12.0–15.0)

## 2014-10-07 SURGERY — BREAST LUMPECTOMY WITH RADIOACTIVE SEED LOCALIZATION
Anesthesia: General | Site: Breast | Laterality: Right

## 2014-10-07 MED ORDER — CEFAZOLIN SODIUM-DEXTROSE 2-3 GM-% IV SOLR
2.0000 g | INTRAVENOUS | Status: AC
  Administered 2014-10-07: 2 g via INTRAVENOUS

## 2014-10-07 MED ORDER — BUPIVACAINE-EPINEPHRINE (PF) 0.25% -1:200000 IJ SOLN
INTRAMUSCULAR | Status: DC | PRN
  Administered 2014-10-07: 10 mL

## 2014-10-07 MED ORDER — CEFAZOLIN SODIUM-DEXTROSE 2-3 GM-% IV SOLR
INTRAVENOUS | Status: AC
  Filled 2014-10-07: qty 50

## 2014-10-07 MED ORDER — LIDOCAINE HCL (CARDIAC) 20 MG/ML IV SOLN
INTRAVENOUS | Status: DC | PRN
Start: 2014-10-07 — End: 2014-10-07
  Administered 2014-10-07: 80 mg via INTRAVENOUS

## 2014-10-07 MED ORDER — ONDANSETRON HCL 4 MG/2ML IJ SOLN
INTRAMUSCULAR | Status: DC | PRN
  Administered 2014-10-07: 4 mg via INTRAVENOUS

## 2014-10-07 MED ORDER — MIDAZOLAM HCL 2 MG/2ML IJ SOLN
1.0000 mg | INTRAMUSCULAR | Status: DC | PRN
  Administered 2014-10-07: 2 mg via INTRAVENOUS

## 2014-10-07 MED ORDER — FENTANYL CITRATE (PF) 100 MCG/2ML IJ SOLN
50.0000 ug | INTRAMUSCULAR | Status: DC | PRN
  Administered 2014-10-07: 100 ug via INTRAVENOUS

## 2014-10-07 MED ORDER — FENTANYL CITRATE (PF) 100 MCG/2ML IJ SOLN
INTRAMUSCULAR | Status: AC
  Filled 2014-10-07: qty 6

## 2014-10-07 MED ORDER — MEPERIDINE HCL 25 MG/ML IJ SOLN: 6.2500 mg | INTRAMUSCULAR | Status: DC | PRN

## 2014-10-07 MED ORDER — OXYCODONE HCL 5 MG PO TABS: 5.0000 mg | ORAL_TABLET | Freq: Once | ORAL | Status: DC | PRN

## 2014-10-07 MED ORDER — HYDROMORPHONE HCL 1 MG/ML IJ SOLN: 0.2500 mg | INTRAMUSCULAR | Status: DC | PRN

## 2014-10-07 MED ORDER — SCOPOLAMINE 1 MG/3DAYS TD PT72
MEDICATED_PATCH | TRANSDERMAL | Status: AC
  Filled 2014-10-07: qty 1

## 2014-10-07 MED ORDER — SCOPOLAMINE 1 MG/3DAYS TD PT72
1.0000 | MEDICATED_PATCH | TRANSDERMAL | Status: DC
Start: 2014-10-07 — End: 2014-10-07
  Administered 2014-10-07: 1.5 mg via TRANSDERMAL

## 2014-10-07 MED ORDER — GLYCOPYRROLATE 0.2 MG/ML IJ SOLN: 0.2000 mg | Freq: Once | INTRAMUSCULAR | Status: DC | PRN

## 2014-10-07 MED ORDER — MIDAZOLAM HCL 2 MG/2ML IJ SOLN
INTRAMUSCULAR | Status: AC
  Filled 2014-10-07: qty 2

## 2014-10-07 MED ORDER — PROPOFOL 10 MG/ML IV BOLUS
INTRAVENOUS | Status: DC | PRN
  Administered 2014-10-07: 200 mg via INTRAVENOUS

## 2014-10-07 MED ORDER — HYDROCODONE-ACETAMINOPHEN 5-325 MG PO TABS: 1.0000 | ORAL_TABLET | ORAL | Status: DC | PRN

## 2014-10-07 MED ORDER — DEXAMETHASONE SODIUM PHOSPHATE 4 MG/ML IJ SOLN
INTRAMUSCULAR | Status: DC | PRN
  Administered 2014-10-07: 10 mg via INTRAVENOUS

## 2014-10-07 MED ORDER — CHLORHEXIDINE GLUCONATE 4 % EX LIQD: 1.0000 "application " | Freq: Once | CUTANEOUS | Status: DC

## 2014-10-07 MED ORDER — LACTATED RINGERS IV SOLN
INTRAVENOUS | Status: DC
Start: 2014-10-07 — End: 2014-10-07
  Administered 2014-10-07: 08:00:00 via INTRAVENOUS

## 2014-10-07 MED ORDER — EPHEDRINE SULFATE 50 MG/ML IJ SOLN
INTRAMUSCULAR | Status: DC | PRN
  Administered 2014-10-07 (×2): 10 mg via INTRAVENOUS

## 2014-10-07 MED ORDER — OXYCODONE HCL 5 MG/5ML PO SOLN: 5.0000 mg | Freq: Once | ORAL | Status: DC | PRN

## 2014-10-07 SURGICAL SUPPLY — 48 items
APPLIER CLIP 9.375 MED OPEN (MISCELLANEOUS) ×3
BINDER BREAST LRG (GAUZE/BANDAGES/DRESSINGS) ×3 IMPLANT
BINDER BREAST MEDIUM (GAUZE/BANDAGES/DRESSINGS) IMPLANT
BINDER BREAST XLRG (GAUZE/BANDAGES/DRESSINGS) IMPLANT
BINDER BREAST XXLRG (GAUZE/BANDAGES/DRESSINGS) IMPLANT
BLADE SURG 15 STRL LF DISP TIS (BLADE) ×1 IMPLANT
BLADE SURG 15 STRL SS (BLADE) ×2
CANISTER SUC SOCK COL 7IN (MISCELLANEOUS) ×3 IMPLANT
CANISTER SUCT 1200ML W/VALVE (MISCELLANEOUS) IMPLANT
CHLORAPREP W/TINT 26ML (MISCELLANEOUS) ×3 IMPLANT
CLIP APPLIE 9.375 MED OPEN (MISCELLANEOUS) ×1 IMPLANT
COVER BACK TABLE 60X90IN (DRAPES) ×3 IMPLANT
COVER MAYO STAND STRL (DRAPES) ×3 IMPLANT
COVER PROBE W GEL 5X96 (DRAPES) ×3 IMPLANT
DECANTER SPIKE VIAL GLASS SM (MISCELLANEOUS) IMPLANT
DEVICE DUBIN W/COMP PLATE 8390 (MISCELLANEOUS) ×3 IMPLANT
DRAPE LAPAROSCOPIC ABDOMINAL (DRAPES) ×3 IMPLANT
DRAPE UTILITY XL STRL (DRAPES) ×3 IMPLANT
ELECT COATED BLADE 2.86 ST (ELECTRODE) ×3 IMPLANT
ELECT REM PT RETURN 9FT ADLT (ELECTROSURGICAL) ×3
ELECTRODE REM PT RTRN 9FT ADLT (ELECTROSURGICAL) ×1 IMPLANT
GLOVE BIOGEL PI IND STRL 7.0 (GLOVE) ×1 IMPLANT
GLOVE BIOGEL PI IND STRL 8 (GLOVE) ×1 IMPLANT
GLOVE BIOGEL PI INDICATOR 7.0 (GLOVE) ×2
GLOVE BIOGEL PI INDICATOR 8 (GLOVE) ×2
GLOVE ECLIPSE 6.5 STRL STRAW (GLOVE) ×3 IMPLANT
GLOVE ECLIPSE 7.5 STRL STRAW (GLOVE) ×3 IMPLANT
GOWN STRL REUS W/ TWL LRG LVL3 (GOWN DISPOSABLE) ×1 IMPLANT
GOWN STRL REUS W/ TWL XL LVL3 (GOWN DISPOSABLE) ×1 IMPLANT
GOWN STRL REUS W/TWL LRG LVL3 (GOWN DISPOSABLE) ×2
GOWN STRL REUS W/TWL XL LVL3 (GOWN DISPOSABLE) ×2
KIT MARKER MARGIN INK (KITS) ×3 IMPLANT
LIQUID BAND (GAUZE/BANDAGES/DRESSINGS) ×3 IMPLANT
NEEDLE HYPO 25X1 1.5 SAFETY (NEEDLE) ×3 IMPLANT
NS IRRIG 1000ML POUR BTL (IV SOLUTION) ×3 IMPLANT
PACK BASIN DAY SURGERY FS (CUSTOM PROCEDURE TRAY) ×3 IMPLANT
PENCIL BUTTON HOLSTER BLD 10FT (ELECTRODE) ×3 IMPLANT
SLEEVE SCD COMPRESS KNEE MED (MISCELLANEOUS) ×3 IMPLANT
SPONGE LAP 4X18 X RAY DECT (DISPOSABLE) ×3 IMPLANT
SUT MNCRL AB 4-0 PS2 18 (SUTURE) ×3 IMPLANT
SUT SILK 2 0 SH (SUTURE) IMPLANT
SUT VICRYL 3-0 CR8 SH (SUTURE) ×3 IMPLANT
SYR CONTROL 10ML LL (SYRINGE) ×3 IMPLANT
TOWEL OR 17X24 6PK STRL BLUE (TOWEL DISPOSABLE) ×3 IMPLANT
TOWEL OR NON WOVEN STRL DISP B (DISPOSABLE) ×3 IMPLANT
TUBE CONNECTING 20'X1/4 (TUBING)
TUBE CONNECTING 20X1/4 (TUBING) IMPLANT
YANKAUER SUCT BULB TIP NO VENT (SUCTIONS) ×3 IMPLANT

## 2014-10-07 NOTE — Op Note (Signed)
Preoperative Diagnosis: radial scar right breast  Postoprative Diagnosis: same  Procedure: Procedure(s): RIGHT BREAST LUMPECTOMY WITH RADIOACTIVE SEED LOCALIZATION   Surgeon: Excell Seltzer T   Assistants: none  Anesthesia:  General LMA anesthesia  Indications: patient has a personal history of left breast cancer. Recent screening mammogram showed a new area of architectural distortion in the upper inner right breast. Large core needle biopsy has shown a radial scar. After discussion of options and risks detailed elsewhere we have elected to proceed with excisional biopsy to rule out underlying malignancy.    Procedure Detail:  Patient underwent accurate placement of a localizing radioactive seed the day prior to surgery. In the holding area seed placement was confirmed with the neoprobe. She was taken to the operating room, placed in the supine position on the operating table and laryngeal mask general anesthesia induced. The right breast was widely sterilely prepped and draped. Patient timeout was performed and correct procedure verified. Using the neoprobe to localize the area of the seed, a curvilinear incision was made in the upper inner right breast near the areolar border and dissection carried down to the simultaneous tissue toward the breast capsule. Short skin and subcutaneous flaps were raised. Using the neoprobe for guidance approximately 2 cm specimen of breast tissue was excised with cautery around the seed. Ex vivo the seed was confirmed in the specimen. The specimen was oriented with ink. Specimen mammography showed the seed in the marking clip within the specimen. This was sent for permanent pathology. Hemostasis was assured in the operative site. The soft tissue was infiltrated with Marcaine. The lumpectomy cavity was marked with clips. Breast the simultaneous tissue were closed with interrupted 3-0 Vicryl and skin closed with subcuticular 4-0 Monocryl and Liquiban. Sponge  needle and instrument counts were correct.    Findings: As above  Estimated Blood Loss:  Minimal         Drains: none  Blood Given: none          Specimens: right breast tissue        Complications:  * No complications entered in OR log *         Disposition: PACU - hemodynamically stable.         Condition: stable

## 2014-10-07 NOTE — Anesthesia Procedure Notes (Signed)
Procedure Name: LMA Insertion Date/Time: 10/07/2014 8:31 AM Performed by: Lyndee Leo Pre-anesthesia Checklist: Patient identified, Emergency Drugs available, Suction available and Patient being monitored Patient Re-evaluated:Patient Re-evaluated prior to inductionOxygen Delivery Method: Circle System Utilized Preoxygenation: Pre-oxygenation with 100% oxygen Intubation Type: IV induction Ventilation: Mask ventilation without difficulty LMA: LMA inserted LMA Size: 4.0 Number of attempts: 1 Airway Equipment and Method: Bite block Placement Confirmation: positive ETCO2 Tube secured with: Tape Dental Injury: Teeth and Oropharynx as per pre-operative assessment

## 2014-10-07 NOTE — Transfer of Care (Signed)
Immediate Anesthesia Transfer of Care Note  Patient: [name] [name]  Procedure(s) Performed: Procedure(s): RIGHT BREAST LUMPECTOMY WITH RADIOACTIVE SEED LOCALIZATION (Right)  Patient Location: PACU  Anesthesia Type:General  Level of Consciousness: awake, sedated and patient cooperative  Airway & Oxygen Therapy: Patient Spontanous Breathing and Patient connected to face mask oxygen  Post-op Assessment: Report given to RN and Post -op Vital signs reviewed and stable  Post vital signs: Reviewed and stable  Last Vitals:  Filed Vitals:   10/07/14 0724  BP: 116/68  Pulse: 71  Temp: 36.5 C  Resp: 20    Complications: No apparent anesthesia complications

## 2014-10-07 NOTE — Anesthesia Postprocedure Evaluation (Signed)
  Anesthesia Post-op Note  Patient: Management consultant  Procedure(s) Performed: Procedure(s): RIGHT BREAST LUMPECTOMY WITH RADIOACTIVE SEED LOCALIZATION (Right)  Patient Location: PACU  Anesthesia Type: General   Level of Consciousness: awake, alert  and oriented  Airway and Oxygen Therapy: Patient Spontanous Breathing  Post-op Pain: mild  Post-op Assessment: Post-op Vital signs reviewed  Post-op Vital Signs: Reviewed  Last Vitals:  Filed Vitals:   10/07/14 1003  BP: 113/78  Pulse: 89  Temp: 36.5 C  Resp:     Complications: No apparent anesthesia complications

## 2014-10-07 NOTE — Anesthesia Preprocedure Evaluation (Signed)
Anesthesia Evaluation  Patient identified by MRN, date of birth, ID band Patient awake    Reviewed: Allergy & Precautions, NPO status , Patient's Chart, lab work & pertinent test results  Airway Mallampati: I  TM Distance: >3 FB Neck ROM: Full    Dental  (+) Teeth Intact, Dental Advisory Given   Pulmonary  breath sounds clear to auscultation        Cardiovascular Rhythm:Regular Rate:Normal     Neuro/Psych    GI/Hepatic GERD-  Medicated and Controlled,  Endo/Other    Renal/GU      Musculoskeletal   Abdominal   Peds  Hematology   Anesthesia Other Findings   Reproductive/Obstetrics                             Anesthesia Physical Anesthesia Plan  ASA: II  Anesthesia Plan: General   Post-op Pain Management:    Induction: Intravenous  Airway Management Planned: LMA  Additional Equipment:   Intra-op Plan:   Post-operative Plan: Extubation in OR  Informed Consent: I have reviewed the patients History and Physical, chart, labs and discussed the procedure including the risks, benefits and alternatives for the proposed anesthesia with the patient or authorized representative who has indicated his/her understanding and acceptance.   Dental advisory given  Plan Discussed with: CRNA, Anesthesiologist and Surgeon  Anesthesia Plan Comments:         Anesthesia Quick Evaluation

## 2014-10-07 NOTE — Discharge Instructions (Signed)
[facility] °Office Phone Number [phone] ° °BREAST BIOPSY/ PARTIAL MASTECTOMY: POST OP INSTRUCTIONS ° °Always review your discharge instruction sheet given to you by the facility where your surgery was performed. ° °IF YOU HAVE DISABILITY OR FAMILY LEAVE FORMS, YOU MUST BRING THEM TO THE OFFICE FOR PROCESSING.  DO NOT GIVE THEM TO YOUR DOCTOR. ° °1. A prescription for pain medication may be given to you upon discharge.  Take your pain medication as prescribed, if needed.  If narcotic pain medicine is not needed, then you may take acetaminophen (Tylenol) or ibuprofen (Advil) as needed. °2. Take your usually prescribed medications unless otherwise directed °3. If you need a refill on your pain medication, please contact your pharmacy.  They will contact our office to request authorization.  Prescriptions will not be filled after 5pm or on week-ends. °4. You should eat very light the first 24 hours after surgery, such as soup, crackers, pudding, etc.  Resume your normal diet the day after surgery. °5. Most patients will experience some swelling and bruising in the breast.  Ice packs and a good support bra will help.  Swelling and bruising can take several days to resolve.  °6. It is common to experience some constipation if taking pain medication after surgery.  Increasing fluid intake and taking a stool softener will usually help or prevent this problem from occurring.  A mild laxative (Milk of Magnesia or Miralax) should be taken according to package directions if there are no bowel movements after 48 hours. °7. Unless discharge instructions indicate otherwise, you may remove your bandages 24-48 hours after surgery, and you may shower at that time.  You may have steri-strips (small skin tapes) in place directly over the incision.  These strips should be left on the skin for 7-10 days.  If your surgeon used skin glue on the incision, you may shower in 24 hours.  The glue will flake off over the  next 2-3 weeks.  Any sutures or staples will be removed at the office during your follow-up visit. °8. ACTIVITIES:  You may resume regular daily activities (gradually increasing) beginning the next day.  Wearing a good support bra or sports bra minimizes pain and swelling.  You may have sexual intercourse when it is comfortable. °a. You may drive when you no longer are taking prescription pain medication, you can comfortably wear a seatbelt, and you can safely maneuver your car and apply brakes. °b. RETURN TO WORK:  ______________________________________________________________________________________ °9. You should see your doctor in the office for a follow-up appointment approximately two weeks after your surgery.  Your doctor’s nurse will typically make your follow-up appointment when she calls you with your pathology report.  Expect your pathology report 2-3 business days after your surgery.  You may call to check if you do not hear from us after three days. °10. OTHER INSTRUCTIONS: _______________________________________________________________________________________________ _____________________________________________________________________________________________________________________________________ °_____________________________________________________________________________________________________________________________________ °_____________________________________________________________________________________________________________________________________ ° °WHEN TO CALL YOUR DOCTOR: °1. Fever over 101.0 °2. Nausea and/or vomiting. °3. Extreme swelling or bruising. °4. Continued bleeding from incision. °5. Increased pain, redness, or drainage from the incision. ° °The clinic staff is available to answer your questions during regular business hours.  Please don’t hesitate to call and ask to speak to one of the nurses for clinical concerns.  If you have a medical emergency, go to the nearest  emergency room or call 911.  A surgeon from [facility] is always on call at the hospital. ° °For further questions, please visit [email or web address]  ° ° ° °  Post Anesthesia Home Care Instructions ° °Activity: °Get plenty of rest for the remainder of the day. A responsible adult should stay with you for 24 hours following the procedure.  °For the next 24 hours, DO NOT: °-Drive a car °-Operate machinery °-Drink alcoholic beverages °-Take any medication unless instructed by your physician °-Make any legal decisions or sign important papers. ° °Meals: °Start with liquid foods such as gelatin or soup. Progress to regular foods as tolerated. Avoid greasy, spicy, heavy foods. If nausea and/or vomiting occur, drink only clear liquids until the nausea and/or vomiting subsides. Call your physician if vomiting continues. ° °Special Instructions/Symptoms: °Your throat may feel dry or sore from the anesthesia or the breathing tube placed in your throat during surgery. If this causes discomfort, gargle with warm salt water. The discomfort should disappear within 24 hours. ° °If you had a scopolamine patch placed behind your ear for the management of post- operative nausea and/or vomiting: ° °1. The medication in the patch is effective for 72 hours, after which it should be removed.  Wrap patch in a tissue and discard in the trash. Wash hands thoroughly with soap and water. °2. You may remove the patch earlier than 72 hours if you experience unpleasant side effects which may include dry mouth, dizziness or visual disturbances. °3. Avoid touching the patch. Wash your hands with soap and water after contact with the patch. °  ° °

## 2014-10-07 NOTE — Interval H&P Note (Signed)
History and Physical Interval Note:  10/07/2014 8:23 AM  [name] [name]  has presented today for surgery, with the diagnosis of radial scar right breast  The various methods of treatment have been discussed with the patient and family. After consideration of risks, benefits and other options for treatment, the patient has consented to  Procedure(s): RIGHT BREAST LUMPECTOMY WITH RADIOACTIVE SEED LOCALIZATION (Right) as a surgical intervention .  The patient's history has been reviewed, patient examined, no change in status, stable for surgery.  I have reviewed the patient's chart and labs.  Questions were answered to the patient's satisfaction.     Georgios Kina T

## 2014-10-07 NOTE — H&P (Signed)
History of Present Illness [name] [name] [name] MD; 09/24/2014 11:58 AM) Patient words: R breast.  The patient is a 54 year old female who presents with a complaint of breast problems. [name] [name] is 54 years old and she first noted a change, like a small hard pea in her left axilla. She brought this to Dr. Orvan [name] attention and bilateral diagnostic mammography and left ultrasonography at the breast Center for 07/18/2013 showed a small area of asymmetry in the inner left breast. Ultrasound of the left breast confirmed an ill-defined area of shadowing at the 9:00 position measuring up to 1.3 cm. In the left axilla there were 2 lymph nodes with slightly increased cortical thickening.  On 08/07/2013 the patient underwent left breast needle core biopsy and this showed ([id number]) and invasive lobular carcinoma (E-cadherin negative) estrogen receptor 70% positive with strong staining intensity, progesterone receptor 70% positive and strong staining intensity, with a proliferation marker of 7% and no HER-2 amplification, the signals ratio being 0.94 in the number per cell 1.50.  Bilateral breast MRI 08/15/2013 found an irregular spiculated mass in the left breast measuring approximately 1.5 cm. There was no abnormal enhancement of the pectoralis. There were at least 2 left axillary lymph nodes and showed focal cortical thickening. There was a single left internal mammary lymph node at the level of the biopsy-proven malignancy which was not pathologic in appearance. The right breast and axilla were benign.  Genetics testing may 2015 was normal and did not reveal a mutation.  She is status post left lumpectomy and axillary lymph node dissection 09/09/2013 for a pT1c pN2, stage IIIa invasive lobular carcinoma, grade 1, repeat HER-2 again negative. Patient had 8/23 LN positive and also evidence of LVI (lymphovascular invasion) and PNI (perineural invasion).  She completed chemotherapy and radiation treatments.  Just started tamoxifen and tolerating this well.  However recent screening mammogram questioned a new mass in the right breast. Subsequent diagnostic mammogram showed a small area of architectural distortion in the medial right breast slightly superiorly in the middle third. Ultrasound was negative. Subsequent stereotactic biopsy was performed. This has revealed a sclerosing lesion or radial scar with usual ductal hyperplasia. Excision of this area has been recommended due to the possibility of underlying malignancy.  She has not felt any lumps in either breast. No nipple discharge or skin changes or other worrisome symptoms.   Other Problems [name] Large, LPN; 9/56/2130 86:57 AM) Arthritis Breast Cancer Hypercholesterolemia Lump In Breast Other disease, cancer, significant illness  Past Surgical History [name] Large, LPN; 8/46/9629 52:84 AM) Breast Biopsy Bilateral. Breast Mass; Local Excision Left. Cesarean Section - 1 Hysterectomy (not due to cancer) - Partial Thyroid Surgery  Diagnostic Studies History [name] Large, LPN; 1/32/4401 02:72 AM) Colonoscopy 5-10 years ago Mammogram within last year Pap Smear 1-5 years ago  Allergies [name] Large, LPN; 5/36/6440 34:74 AM) No Known Drug Allergies05/26/2016  Medication History [name] Large, LPN; 2/59/5638 75:64 AM) Vitamin D (Cholecalciferol) (1000UNIT Tablet, Oral) Active. Naproxen Sodium (220MG Capsule, Oral) Active. Tamoxifen Citrate (20MG Tablet, Oral) Active. Doxycycline Hyclate (100MG Capsule, Oral) Active. Medications Reconciled  Social History [name] Large, LPN; 3/32/9518 84:16 AM) Alcohol use Moderate alcohol use. Caffeine use Carbonated beverages, Coffee, Tea. No drug use Tobacco use Never smoker.  Family History [name] Large, LPN; 10/05/3014 01:09 AM) Anesthetic complications Father. Arthritis Mother. Breast Cancer Mother. Cancer Mother. Heart Disease  Father. Hypertension Mother. Migraine Headache Daughter.  Pregnancy / Birth History [name] Large, LPN; 07/22/5571 22:02 AM) Age at menarche 45  years. Age of menopause 51-55 Contraceptive History Contraceptive implant, Oral contraceptives. Gravida 2 Maternal age 50-25 Para 2  Review of Systems [name] Large LPN; 05/17/4351 91:22 AM) General Present- Night Sweats. Not Present- Appetite Loss, Chills, Fatigue, Fever, Weight Gain and Weight Loss. Skin Not Present- Change in Wart/Mole, Dryness, Hives, Jaundice, New Lesions, Non-Healing Wounds, Rash and Ulcer. HEENT Present- Wears glasses/contact lenses. Not Present- Earache, Hearing Loss, Hoarseness, Nose Bleed, Oral Ulcers, Ringing in the Ears, Seasonal Allergies, Sinus Pain, Sore Throat, Visual Disturbances and Yellow Eyes. Respiratory Not Present- Bloody sputum, Chronic Cough, Difficulty Breathing, Snoring and Wheezing. Breast Present- Breast Mass. Not Present- Breast Pain, Nipple Discharge and Skin Changes. Cardiovascular Not Present- Chest Pain, Difficulty Breathing Lying Down, Leg Cramps, Palpitations, Rapid Heart Rate, Shortness of Breath and Swelling of Extremities. Gastrointestinal Not Present- Abdominal Pain, Bloating, Bloody Stool, Change in Bowel Habits, Chronic diarrhea, Constipation, Difficulty Swallowing, Excessive gas, Gets full quickly at meals, Hemorrhoids, Indigestion, Nausea, Rectal Pain and Vomiting. Female Genitourinary Not Present- Frequency, Nocturia, Painful Urination, Pelvic Pain and Urgency. Musculoskeletal Not Present- Back Pain, Joint Pain, Joint Stiffness, Muscle Pain, Muscle Weakness and Swelling of Extremities. Neurological Not Present- Decreased Memory, Fainting, Headaches, Numbness, Seizures, Tingling, Tremor, Trouble walking and Weakness. Psychiatric Not Present- Anxiety, Bipolar, Change in Sleep Pattern, Depression, Fearful and Frequent crying. Endocrine Present- Hot flashes. Not Present- Cold Intolerance,  Excessive Hunger, Hair Changes, Heat Intolerance and New Diabetes. Hematology Not Present- Easy Bruising, Excessive bleeding, Gland problems, HIV and Persistent Infections.   Vitals [name] Jersey Turner LPN; 5/83/4621 94:71 AM) 09/24/2014 11:11 AM Weight: 146.38 lb Height: 67in Body Surface Area: 1.77 m Body Mass Index: 22.93 kg/m Temp.: 98.27F  Pulse: 80 (Regular)  BP: 118/80 (Sitting, Left Arm, Standard)    Physical Exam [name] [name] T. Keila Turan MD; 09/24/2014 11:59 AM) The physical exam findings are as follows: Note:General: Well-developed female in no acute distress Skin: No rash or infection Lungs: Clear equal breath sounds without increased work of breathing Cardiac: Regular rate and rhythm without murmurs. No edema Breasts: No palpable masses in either breast with particular attention to the lumpectomy site medially in the left breast. No lymphedema. No palpable axillary adenopathy Abdomen: Soft nontender Extremities no edema Neurologic alert and fully oriented. Gait normal.    Assessment & Plan [name] [name] T. Ayelet Gruenewald MD; 09/24/2014 12:09 PM) BREAST CANCER, STAGE 3, LEFT (174.9  C50.912) Impression: Doing very well at 1 year with no evidence of recurrence or complications from treatment [place] (611.89  N64.89) Impression: Medial right breast. We discussed the diagnosis and the small chance of underlying early malignancy. We discussed options of follow-up versus excision. We discussed the BE generally recommend excision and she is strongly in favor of this. I discussed radioactive seed localized lumpectomy including the indications, nature of surgery, recovery and risks of anesthesia complications, bleeding or infection. Current Plans  Pt Education - [facility] Breast Biopsy HCI Schedule for Surgery Radioactive seed localized right breast lumpectomy under general anesthesia as an outpatient   Signed by [name], MD (09/24/2014 12:10 PM)

## 2014-10-08 ENCOUNTER — Encounter (HOSPITAL_BASED_OUTPATIENT_CLINIC_OR_DEPARTMENT_OTHER): Payer: Self-pay | Admitting: General Surgery

## 2014-10-25 ENCOUNTER — Encounter: Payer: Self-pay | Admitting: Oncology

## 2014-10-26 ENCOUNTER — Other Ambulatory Visit: Payer: Self-pay | Admitting: *Deleted

## 2014-11-03 ENCOUNTER — Other Ambulatory Visit: Payer: Self-pay | Admitting: Obstetrics and Gynecology

## 2014-11-04 ENCOUNTER — Other Ambulatory Visit: Payer: Self-pay | Admitting: Obstetrics and Gynecology

## 2014-11-04 DIAGNOSIS — E049 Nontoxic goiter, unspecified: Secondary | ICD-10-CM

## 2014-11-04 LAB — CYTOLOGY - PAP

## 2014-11-05 ENCOUNTER — Other Ambulatory Visit: Payer: Self-pay | Admitting: Nurse Practitioner

## 2014-11-09 ENCOUNTER — Other Ambulatory Visit: Payer: BLUE CROSS/BLUE SHIELD

## 2014-11-19 ENCOUNTER — Other Ambulatory Visit: Payer: Self-pay | Admitting: Oncology

## 2014-12-01 ENCOUNTER — Ambulatory Visit (HOSPITAL_BASED_OUTPATIENT_CLINIC_OR_DEPARTMENT_OTHER): Payer: BLUE CROSS/BLUE SHIELD

## 2014-12-01 ENCOUNTER — Telehealth: Payer: Self-pay | Admitting: Nurse Practitioner

## 2014-12-01 ENCOUNTER — Encounter: Payer: Self-pay | Admitting: Nurse Practitioner

## 2014-12-01 ENCOUNTER — Other Ambulatory Visit: Payer: Self-pay | Admitting: *Deleted

## 2014-12-01 ENCOUNTER — Ambulatory Visit (HOSPITAL_BASED_OUTPATIENT_CLINIC_OR_DEPARTMENT_OTHER): Payer: BLUE CROSS/BLUE SHIELD | Admitting: Nurse Practitioner

## 2014-12-01 VITALS — BP 126/71 | HR 96 | Temp 97.9°F | Resp 18 | Ht 66.0 in | Wt 146.9 lb

## 2014-12-01 DIAGNOSIS — N951 Menopausal and female climacteric states: Secondary | ICD-10-CM

## 2014-12-01 DIAGNOSIS — C773 Secondary and unspecified malignant neoplasm of axilla and upper limb lymph nodes: Secondary | ICD-10-CM

## 2014-12-01 DIAGNOSIS — C50812 Malignant neoplasm of overlapping sites of left female breast: Secondary | ICD-10-CM

## 2014-12-01 DIAGNOSIS — C50212 Malignant neoplasm of upper-inner quadrant of left female breast: Secondary | ICD-10-CM

## 2014-12-01 DIAGNOSIS — Z170 Estrogen receptor positive status [ER+]: Secondary | ICD-10-CM | POA: Diagnosis not present

## 2014-12-01 DIAGNOSIS — R232 Flushing: Secondary | ICD-10-CM | POA: Insufficient documentation

## 2014-12-01 LAB — COMPREHENSIVE METABOLIC PANEL (CC13)
ALK PHOS: 70 U/L (ref 40–150)
ALT: 31 U/L (ref 0–55)
AST: 20 U/L (ref 5–34)
Albumin: 3.9 g/dL (ref 3.5–5.0)
Anion Gap: 8 mEq/L (ref 3–11)
BUN: 15.2 mg/dL (ref 7.0–26.0)
CALCIUM: 8.9 mg/dL (ref 8.4–10.4)
CO2: 23 mEq/L (ref 22–29)
CREATININE: 0.8 mg/dL (ref 0.6–1.1)
Chloride: 108 mEq/L (ref 98–109)
EGFR: 80 mL/min/{1.73_m2} — AB (ref >90)
Glucose: 142 mg/dl — ABNORMAL HIGH (ref 70–140)
POTASSIUM: 3.8 meq/L (ref 3.5–5.1)
Sodium: 139 mEq/L (ref 136–145)
TOTAL PROTEIN: 6.8 g/dL (ref 6.4–8.3)
Total Bilirubin: 0.41 mg/dL (ref 0.20–1.20)

## 2014-12-01 LAB — CBC WITH DIFFERENTIAL/PLATELET
BASO%: 0.6 % (ref 0.0–2.0)
Basophils Absolute: 0 10*3/uL (ref 0.0–0.1)
EOS ABS: 0.2 10*3/uL (ref 0.0–0.5)
EOS%: 4.9 % (ref 0.0–7.0)
HCT: 37.5 % (ref 34.8–46.6)
HGB: 12.8 g/dL (ref 11.6–15.9)
LYMPH#: 1 10*3/uL (ref 0.9–3.3)
LYMPH%: 20.9 % (ref 14.0–49.7)
MCH: 31.2 pg (ref 25.1–34.0)
MCHC: 34.2 g/dL (ref 31.5–36.0)
MCV: 91.1 fL (ref 79.5–101.0)
MONO#: 0.4 10*3/uL (ref 0.1–0.9)
MONO%: 7.9 % (ref 0.0–14.0)
NEUT#: 3.2 10*3/uL (ref 1.5–6.5)
NEUT%: 65.7 % (ref 38.4–76.8)
Platelets: 265 10*3/uL (ref 145–400)
RBC: 4.11 10*6/uL (ref 3.70–5.45)
RDW: 13 % (ref 11.2–14.5)
WBC: 4.9 10*3/uL (ref 3.9–10.3)

## 2014-12-01 MED ORDER — VENLAFAXINE HCL ER 37.5 MG PO CP24: 37.5000 mg | ORAL_CAPSULE | Freq: Every day | ORAL | Status: DC

## 2014-12-01 NOTE — Telephone Encounter (Signed)
Patient come by to see if we had anything available today for a visit instead of 8/3 as her husband is an inpatient and may be d/c today,done    anne

## 2014-12-01 NOTE — Progress Notes (Signed)
[place]  [phone]) [phone] Fax:(336) [phone]     ID: [name] OB: July 27, 1960  MR#: [id number]  LSL#:[id number]  PCP: [name]., MD GYN:  [name] SU: Excell Seltzer OTHER MD: [name]  CHIEF COMPLAINT: Locally advanced breast cancer for chemotherapy f/u. CURRENT TREATMENT: Completing radiation, starting tamoxifen  BREAST CANCER HISTORY: From the original intake note  [name] [name] is 54 years old and she first noted a change, like a small hard pea in her left axilla. She brought this to Dr. [name] attention and bilateral diagnostic mammography and left ultrasonography at the [facility] Center for 07/18/2013 showed a small area of asymmetry in the inner left breast measuring perhaps 5 mm which was not palpable by physical exam. In the right breast there was some loosely grouped calcifications spanning up to 0.7 cm. Ultrasound of the left breast confirmed an ill-defined area of shadowing at the 9:00 position measuring up to 1.3 cm. There was also a 4 mm circumscribed hypoechoic mass at the 10:00 position. This is felt to be probably benign. In the left axilla there were 2 lymph nodes with slightly increased cortical thickening, although the fatty hila were maintained. These were not related to the patient's feeling of a "pea like mass" in her left axilla, which had by then pretty much resolved.   On 08/07/2013 the patient underwent left breast needle core biopsy and this showed ([id number]) and invasive lobular carcinoma (E-cadherin negative) estrogen receptor 70% positive with strong staining intensity, progesterone receptor 70% positive and strong staining intensity, with a proliferation marker of 7% and no HER-2 amplification, the signals ratio being 0.94 in the number per cell 1.50.  Bilateral breast MRI 08/15/2013 found an irregular spiculated mass in the left breast measuring approximately 1.5 cm. There was no abnormal enhancement of the pectoralis. There  were at least 2 left axillary lymph nodes and showed focal cortical thickening. There was a single left internal mammary lymph node at the level of the biopsy-proven malignancy which was not pathologic in appearance. The right breast and axilla were benign.  Genetics testing may 2015 was normal and did not reveal a mutation in the genes tested: ATM, BARD1, BRCA1, BRCA2, BRIP1, CDH1, CHEK2, MRE11A, MUTYH, NBN, NF1, PALB2, PTEN, RAD50, RAD51C, RAD51D, and TP53. She is status post left lumpectomy and axillary lymph node dissection by Dr Excell Seltzer 09/09/2013 for a pT1c pN2, stage IIIa invasive lobular carcinoma, grade 1, repeat HER-2 again negative. Patient had 8/23 LN positive and also evidence of LVI (lymphovascular invasion) and PNI (perineural invasion).  Her subsequent history is as detailed below  INTERVAL HISTORY: Rebel returns today for follow-up of her breast cancer. She has been on tamoxifen since April 2016 and is tolerating this drug relatively well. She denies joint changes or vaginal wetness. She does have hot flashes that are moderately controlled by 326m gabapentin QHS. This of course does not provide adequate coverage during the day, and she breaks out into sweats at least once daily.   REVIEW OF SYSTEMS: Cleva denies fevers, chills, nausea, vomiting, or changes in bowel or bladder habits. She denies shortness of breath, chest pain, cough, or palpitations. She has no headaches, dizziness, vision changes, or fatigue. She continues to perform yoga for exercise. She has had some bruising here and there, but she is on a baby aspirin. A detailed review of systems is otherwise stable.  PAST MEDICAL HISTORY: Past Medical History  Diagnosis Date  . Palpitations 2009    Improved; Secondary to  premature ventricular contractions. No problems since-pt states was caring for ailing father, anxiety  . Anxiety disorder     Mild: treated by her gynecologist with selective serotonin reuptake inhibitor    . Nonspecific abnormal electrocardiogram (ECG) (EKG) 2009  . GERD (gastroesophageal reflux disease)     rolaids  . Hot flashes   . Family history of malignant neoplasm of breast   . Arthritis     hands and feet  . Wears glasses   . Malignant neoplasm of breast (female), unspecified site 08/07/13    left breast invasive mammary ca in situ  . Anxiety     PAST SURGICAL HISTORY: Past Surgical History  Procedure Laterality Date  . Thyroidectomy, partial    . Cesarean section    . Endometrial ablation    . Dilation and curettage of uterus    . Laparoscopic assisted vaginal hysterectomy  02/07/2011    Procedure: LAPAROSCOPIC ASSISTED VAGINAL HYSTERECTOMY;  Surgeon: [name];  Location: [facility];  Service: Gynecology;  Laterality: N/A;  . Abdominal hysterectomy  02/07/2011    Procedure: HYSTERECTOMY ABDOMINAL;  Surgeon: [name];  Location: [facility];  Service: Gynecology;  Laterality: N/A;  . Breast lumpectomy with needle localization and axillary lymph node dissection Left 09/09/2013    Procedure: LEFT BREAST LUMPECTOMY WITH NEEDLE LOCALIZATION AND LEFT AXILLARY LYMPH NODE DISSECTION;  Surgeon: [name], MD;  Location: [facility];  Service: General;  Laterality: Left;  . Portacath placement Right 09/29/2013    Procedure: INSERTION PORT-A-CATH;  Surgeon: [name], MD;  Location: [facility];  Service: General;  Laterality: Right;  . Breast lumpectomy with radioactive seed localization Right 10/07/2014    Procedure: RIGHT BREAST LUMPECTOMY WITH RADIOACTIVE SEED LOCALIZATION;  Surgeon: Excell Seltzer, MD;  Location: [facility];  Service: General;  Laterality: Right;    FAMILY HISTORY Family History  Problem Relation Age of Onset  . Angina Father   . Aortic aneurysm Father   . Dementia Father   . Arrhythmia Maternal Aunt     A fib  . Breast cancer Maternal Aunt 56    currently 67  . Hypertension Other   .  Arrhythmia Other     Uncle- a fib  . Arrhythmia Maternal Aunt     A fib  . Breast cancer Maternal Aunt     dx 61s; currently 38s  . Breast cancer Mother     dx 74s. Died at 35  . Colon cancer Maternal Grandmother     dx 90s; died at 37  . Colon cancer Maternal Grandfather     dx 55s; died in 78s  . Cancer Paternal Uncle     unknown primary in early 86s  . Breast cancer Maternal Aunt     dx 19s; died in 42s  The patient's father died from heart disease in the setting of dementia at the age of 73. The patient's mother died at the age of 33. She had been diagnosed with breast cancer at the age of 26. The patient is a single child. Her mother had 4 sisters, 3 of whom had breast cancer diagnosed at the age of 25, 54, and 52. There is also history of colorectal cancer on the maternal side of the family. BRCA testing negative.  GYNECOLOGIC HISTORY:   menarche age 44, first live birth age 78, the patient is [place] P2. She underwent a hysterectomy without salpingo-oophorectomy in 2012. 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 Her husband [name] also works for [employer], running [employer].. Evorn Gong lives in [place] where he works as a Public relations account executive. Daughter Darrick Penna this in [place], where she works as an Engineering geologist in a [employer]. The patient has no grandchildren. She is a Information systems manager.   ADVANCED DIRECTIVES: Not in place   HEALTH MAINTENANCE: History  Substance Use Topics  . Smoking status: Never Smoker   . Smokeless tobacco: Not on file  . Alcohol Use: Yes     Comment: drinks 1-2 beers a week     Colonoscopy:2010   PAP:2012, s/p hysterectomy.  Bone density:2012   Lipid panel: 11/05/2013 TC 219, HDL 41, LDL 137, TG 204  No Known Allergies  Current Outpatient Prescriptions  Medication Sig Dispense Refill  . aspirin EC 81 MG tablet Take 1 tablet  (81 mg total) by mouth daily.    . Cholecalciferol (VITAMIN D) 2000 UNITS tablet Take 2,000 Units by mouth daily.    Marland Kitchen gabapentin (NEURONTIN) 300 MG capsule TAKE ONE CAPSULE AT BEDTIME 30 capsule 3  . tamoxifen (NOLVADEX) 20 MG tablet Take 1 tablet (20 mg total) by mouth daily. 90 tablet 12  . venlafaxine XR (EFFEXOR-XR) 37.5 MG 24 hr capsule Take 1 capsule (37.5 mg total) by mouth daily with breakfast. 30 capsule 5   No current facility-administered medications for this visit.    OBJECTIVE: middle-aged white woman who appears well Filed Vitals:   12/01/14 1401  BP: 126/71  Pulse: 96  Temp: 97.9 F (36.6 C)  Resp: 18     Body mass index is 23.72 kg/(m^2).      ECOG FS:0 - Asymptomatic    Skin: warm, dry  HEENT: sclerae anicteric, conjunctivae pink, oropharynx clear. No thrush or mucositis.  Lymph Nodes: No cervical or supraclavicular lymphadenopathy  Lungs: clear to auscultation bilaterally, no rales, wheezes, or rhonci  Heart: regular rate and rhythm  Abdomen: round, soft, non tender, positive bowel sounds  Musculoskeletal: No focal spinal tenderness, no peripheral edema  Neuro: non focal, well oriented, positive affect  Breasts: left breast status post lumpectomy and radiation. No evidence of recurrent disease. Left axilla benign. Right breast unremarkable.   LAB RESULTS:  CMP     Component Value Date/Time   NA 139 12/01/2014 1328   K 3.8 12/01/2014 1328   CO2 23 12/01/2014 1328   GLUCOSE 142* 12/01/2014 1328   BUN 15.2 12/01/2014 1328   CREATININE 0.8 12/01/2014 1328   CALCIUM 8.9 12/01/2014 1328   PROT 6.8 12/01/2014 1328   ALBUMIN 3.9 12/01/2014 1328   AST 20 12/01/2014 1328   ALT 31 12/01/2014 1328   ALKPHOS 70 12/01/2014 1328   BILITOT 0.41 12/01/2014 1328   I No results found for: SPEP  Lab Results  Component Value Date   WBC 4.9 12/01/2014   NEUTROABS 3.2 12/01/2014   HGB 12.8 12/01/2014   HCT 37.5 12/01/2014   MCV 91.1 12/01/2014   PLT 265  12/01/2014      Chemistry      Component Value Date/Time   NA 139 12/01/2014 1328   K 3.8 12/01/2014 1328   CO2 23 12/01/2014 1328   BUN 15.2 12/01/2014 1328   CREATININE 0.8 12/01/2014 1328      Component Value Date/Time   CALCIUM 8.9 12/01/2014 1328   ALKPHOS 70 12/01/2014 1328   AST 20  12/01/2014 1328   ALT 31 12/01/2014 1328   BILITOT 0.41 12/01/2014 1328      STUDIES: No results found.   ASSESSMENT: 54 y.o. BRCA negative [place] woman status post left breast biopsy for 01/18/2014 showing invasive lobular carcinoma (E-cadherin negative), estrogen receptor 70% positive, progesterone receptor 70% positive, with an MIB-1 of 7%, and no HER-2 amplification  (1) ultrasound-guided biopsy of a suspicious lymph node in the left axilla 08/19/2012 was positive  (2) status post left lumpectomy and axillary lymph node dissection 09/09/2013 for a pT1c pN2, stage IIIa invasive lobular carcinoma, grade 1, repeat HER-2 again negative. [She had 8 of 23 axillary lymph nodes involved]  (4) Adjuvant chemotherapy consisting of doxorubicin and cyclophosphamide in dose dense fashion x4 completed 12/18/2013;followed by paclitaxel weekly x 12 completed 03/20/2014  (5) radiation completed in [facility] 07/01/2014  (6) started tamoxifen 08/01/2014  (a) the patient is status post simple hysterectomy, no salpingo-oophorectomy  (7) hot flashes - gabapentin 39m nightly, to begin venlafaxine daily.   PLAN: Alisah is doing well today. The labs were reviewed in detail and were entirely stable. She is tolerating the tamoxifen relatively well, but hot flashes remain a concern. We discussed her options for controlling them better, and eventually settled on venlafaxine 37.574m She understands to take this drug consistently every day, and that it may take 2-3 weeks to see results. In the same manner, if she chooses to discontinue the drug she should wean off of it slowly.   Lita will return in 3 months for  labs and a follow up visit. She understands and agrees with this plan. She knows the goal of treatment in her case is cure. She has been encouraged to call with any issues that might arise before her next visit here.   HeLaurie PandaNP 12/01/2014

## 2014-12-02 ENCOUNTER — Other Ambulatory Visit: Payer: BLUE CROSS/BLUE SHIELD

## 2014-12-02 ENCOUNTER — Ambulatory Visit: Payer: BLUE CROSS/BLUE SHIELD | Admitting: Nurse Practitioner

## 2015-02-22 ENCOUNTER — Other Ambulatory Visit: Payer: Self-pay | Admitting: Oncology

## 2015-03-03 ENCOUNTER — Other Ambulatory Visit: Payer: Self-pay | Admitting: *Deleted

## 2015-03-03 DIAGNOSIS — C50212 Malignant neoplasm of upper-inner quadrant of left female breast: Secondary | ICD-10-CM

## 2015-03-04 ENCOUNTER — Telehealth: Payer: Self-pay | Admitting: Oncology

## 2015-03-04 ENCOUNTER — Ambulatory Visit (HOSPITAL_BASED_OUTPATIENT_CLINIC_OR_DEPARTMENT_OTHER): Payer: BLUE CROSS/BLUE SHIELD | Admitting: Oncology

## 2015-03-04 ENCOUNTER — Other Ambulatory Visit (HOSPITAL_BASED_OUTPATIENT_CLINIC_OR_DEPARTMENT_OTHER): Payer: BLUE CROSS/BLUE SHIELD

## 2015-03-04 ENCOUNTER — Other Ambulatory Visit: Payer: Self-pay | Admitting: *Deleted

## 2015-03-04 VITALS — BP 126/75 | HR 80 | Temp 98.1°F | Resp 18 | Ht 66.0 in | Wt 149.5 lb

## 2015-03-04 DIAGNOSIS — C773 Secondary and unspecified malignant neoplasm of axilla and upper limb lymph nodes: Secondary | ICD-10-CM

## 2015-03-04 DIAGNOSIS — C50812 Malignant neoplasm of overlapping sites of left female breast: Secondary | ICD-10-CM

## 2015-03-04 DIAGNOSIS — Z170 Estrogen receptor positive status [ER+]: Secondary | ICD-10-CM | POA: Diagnosis not present

## 2015-03-04 DIAGNOSIS — C50212 Malignant neoplasm of upper-inner quadrant of left female breast: Secondary | ICD-10-CM

## 2015-03-04 DIAGNOSIS — N951 Menopausal and female climacteric states: Secondary | ICD-10-CM

## 2015-03-04 LAB — CBC WITH DIFFERENTIAL/PLATELET
BASO%: 0.8 % (ref 0.0–2.0)
Basophils Absolute: 0 10*3/uL (ref 0.0–0.1)
EOS%: 3.3 % (ref 0.0–7.0)
Eosinophils Absolute: 0.2 10*3/uL (ref 0.0–0.5)
HCT: 38.6 % (ref 34.8–46.6)
HGB: 13 g/dL (ref 11.6–15.9)
LYMPH#: 1 10*3/uL (ref 0.9–3.3)
LYMPH%: 20.8 % (ref 14.0–49.7)
MCH: 31.9 pg (ref 25.1–34.0)
MCHC: 33.7 g/dL (ref 31.5–36.0)
MCV: 94.8 fL (ref 79.5–101.0)
MONO#: 0.5 10*3/uL (ref 0.1–0.9)
MONO%: 10.7 % (ref 0.0–14.0)
NEUT%: 64.4 % (ref 38.4–76.8)
NEUTROS ABS: 3.1 10*3/uL (ref 1.5–6.5)
Platelets: 224 10*3/uL (ref 145–400)
RBC: 4.07 10*6/uL (ref 3.70–5.45)
RDW: 13 % (ref 11.2–14.5)
WBC: 4.9 10*3/uL (ref 3.9–10.3)

## 2015-03-04 LAB — COMPREHENSIVE METABOLIC PANEL (CC13)
ALT: 34 U/L (ref 0–55)
AST: 22 U/L (ref 5–34)
Albumin: 3.8 g/dL (ref 3.5–5.0)
Alkaline Phosphatase: 71 U/L (ref 40–150)
Anion Gap: 5 mEq/L (ref 3–11)
BILIRUBIN TOTAL: 0.4 mg/dL (ref 0.20–1.20)
BUN: 14.6 mg/dL (ref 7.0–26.0)
CO2: 27 meq/L (ref 22–29)
CREATININE: 0.8 mg/dL (ref 0.6–1.1)
Calcium: 9.1 mg/dL (ref 8.4–10.4)
Chloride: 108 mEq/L (ref 98–109)
EGFR: 86 mL/min/{1.73_m2} — ABNORMAL LOW (ref >90)
Glucose: 86 mg/dl (ref 70–140)
Potassium: 4.2 mEq/L (ref 3.5–5.1)
SODIUM: 140 meq/L (ref 136–145)
TOTAL PROTEIN: 6.6 g/dL (ref 6.4–8.3)

## 2015-03-04 MED ORDER — VENLAFAXINE HCL ER 37.5 MG PO CP24: 37.5000 mg | ORAL_CAPSULE | Freq: Every day | ORAL | Status: DC

## 2015-03-04 MED ORDER — TAMOXIFEN CITRATE 20 MG PO TABS: 20.0000 mg | ORAL_TABLET | Freq: Every day | ORAL | Status: DC

## 2015-03-04 MED ORDER — GABAPENTIN 300 MG PO CAPS: 300.0000 mg | ORAL_CAPSULE | Freq: Every day | ORAL | Status: DC

## 2015-03-04 NOTE — Progress Notes (Signed)
[place]  Telephone:(336) [phone] Fax:(336) [phone]     ID: [name] [name] OB: [birth year]  MR#: [id number]  GNF#:[id number]  PCP: [name] [name]., MD GYN:  [name] [name] SU: [name] [name] OTHER MD: [name] [name]  CHIEF COMPLAINT: Locally advanced breast cancer   CURRENT TREATMENT:  tamoxifen  BREAST CANCER HISTORY: From the original intake note  "[name] [name] is 54 years old and she first noted a change, like a small hard pea in her left axilla. She brought this to Dr. [name] attention and bilateral diagnostic mammography and left ultrasonography at the breast Center for 07/18/2013 showed a small area of asymmetry in the inner left breast measuring perhaps 5 mm which was not palpable by physical exam. In the right breast there was some loosely grouped calcifications spanning up to 0.7 cm. Ultrasound of the left breast confirmed an ill-defined area of shadowing at the 9:00 position measuring up to 1.3 cm. There was also a 4 mm circumscribed hypoechoic mass at the 10:00 position. This is felt to be probably benign. In the left axilla there were 2 lymph nodes with slightly increased cortical thickening, although the fatty hila were maintained. These were not related to the patient's feeling of a "pea like mass" in her left axilla, which had by then pretty much resolved.   On 08/07/2013 the patient underwent left breast needle core biopsy and this showed ([id number]) and invasive lobular carcinoma (E-cadherin negative) estrogen receptor 70% positive with strong staining intensity, progesterone receptor 70% positive and strong staining intensity, with a proliferation marker of 7% and no HER-2 amplification, the signals ratio being 0.94 in the number per cell 1.50.  Bilateral breast MRI 08/15/2013 found an irregular spiculated mass in the left breast measuring approximately 1.5 cm. There was no abnormal enhancement of the pectoralis. There were at least 2 left axillary lymph nodes and  showed focal cortical thickening. There was a single left internal mammary lymph node at the level of the biopsy-proven malignancy which was not pathologic in appearance. The right breast and axilla were benign.  Genetics testing may 2015 was normal and did not reveal a mutation in the genes tested: ATM, BARD1, BRCA1, BRCA2, BRIP1, CDH1, CHEK2, MRE11A, MUTYH, NBN, NF1, PALB2, PTEN, RAD50, RAD51C, RAD51D, and TP53. She is status post left lumpectomy and axillary lymph node dissection by Dr [name] [name] 09/09/2013 for a pT1c pN2, stage IIIa invasive lobular carcinoma, grade 1, repeat HER-2 again negative. Patient had 8/23 LN positive and also evidence of LVI (lymphovascular invasion) and PNI (perineural invasion)."  Her subsequent history is as detailed below  INTERVAL HISTORY: [name] [name] returns today for follow-up of her breast cancer. She continues on tamoxifen which she is tolerating well. The gabapentin at bedtime and the venlafaxine during the day have significantly improve the hot flashes problems. She obtains all 3 drugs for $13 a month, which is very favorable.  REVIEW OF SYSTEMS: She is not exercising regularly, although her work does involve some physical activity. A detailed review of systems today was otherwise noncontributory  PAST MEDICAL HISTORY: Past Medical History  Diagnosis Date  . Palpitations 2009    Improved; Secondary to premature ventricular contractions. No problems since-pt states was caring for ailing father, anxiety  . Anxiety disorder     Mild: treated by her gynecologist with selective serotonin reuptake inhibitor  . Nonspecific abnormal electrocardiogram (ECG) (EKG) 2009  . GERD (gastroesophageal reflux disease)     rolaids  . Hot flashes   . Family history of malignant  neoplasm of breast   . Arthritis     hands and feet  . Wears glasses   . Malignant neoplasm of breast (female), unspecified site 08/07/13    left breast invasive mammary ca in situ  . Anxiety      PAST SURGICAL HISTORY: Past Surgical History  Procedure Laterality Date  . Thyroidectomy, partial    . Cesarean section    . Endometrial ablation    . Dilation and curettage of uterus    . Laparoscopic assisted vaginal hysterectomy  02/07/2011    Procedure: LAPAROSCOPIC ASSISTED VAGINAL HYSTERECTOMY;  Surgeon: [name] [name];  Location: [facility];  Service: Gynecology;  Laterality: N/A;  . Abdominal hysterectomy  02/07/2011    Procedure: HYSTERECTOMY ABDOMINAL;  Surgeon: [name] [name];  Location: [facility];  Service: Gynecology;  Laterality: N/A;  . Breast lumpectomy with needle localization and axillary lymph node dissection Left 09/09/2013    Procedure: LEFT BREAST LUMPECTOMY WITH NEEDLE LOCALIZATION AND LEFT AXILLARY LYMPH NODE DISSECTION;  Surgeon: [name], MD;  Location: [facility];  Service: General;  Laterality: Left;  . Portacath placement Right 09/29/2013    Procedure: INSERTION PORT-A-CATH;  Surgeon: [name], MD;  Location: [facility];  Service: General;  Laterality: Right;  . Breast lumpectomy with radioactive seed localization Right 10/07/2014    Procedure: RIGHT BREAST LUMPECTOMY WITH RADIOACTIVE SEED LOCALIZATION;  Surgeon: [name] Seltzer, MD;  Location: [facility];  Service: General;  Laterality: Right;    FAMILY HISTORY Family History  Problem Relation Age of Onset  . Angina Father   . Aortic aneurysm Father   . Dementia Father   . Arrhythmia Maternal Aunt     A fib  . Breast cancer Maternal Aunt 56    currently 60  . Hypertension Other   . Arrhythmia Other     Uncle- a fib  . Arrhythmia Maternal Aunt     A fib  . Breast cancer Maternal Aunt     dx 35s; currently 38s  . Breast cancer Mother     dx 63s. Died at 66  . Colon cancer Maternal Grandmother     dx 18s; died at 74  . Colon cancer Maternal Grandfather     dx 19s; died in 72s  . Cancer Paternal Uncle     unknown primary in early 23s   . Breast cancer Maternal Aunt     dx 40s; died in 60s  The patient's father died from heart disease in the setting of dementia at the age of 63. The patient's mother died at the age of 54. She had been diagnosed with breast cancer at the age of 60. The patient is a single child. Her mother had 4 sisters, 3 of whom had breast cancer diagnosed at the age of 74, 5, and 54. There is also history of colorectal cancer on the maternal side of the family. BRCA testing negative.  GYNECOLOGIC HISTORY:   menarche age 59, first live birth age 71, the patient is [place] P2. She underwent a hysterectomy without salpingo-oophorectomy in 2012. 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 Her husband [name] also works for [employer], running [employer].. Evorn Gong lives in [place] where he works as a Public relations account executive. Daughter Darrick Penna this in [place], where she works as an Engineering geologist in a [employer]. The  patient has no grandchildren. She is a Information systems manager.   ADVANCED DIRECTIVES: Not in place   HEALTH MAINTENANCE: Social History  Substance Use Topics  . Smoking status: Never Smoker   . Smokeless tobacco: Not on file  . Alcohol Use: Yes     Comment: drinks 1-2 beers a week     Colonoscopy:2010   PAP:2012, s/p hysterectomy.  Bone density:2012   Lipid panel: 11/05/2013 TC 219, HDL 41, LDL 137, TG 204  No Known Allergies  Current Outpatient Prescriptions  Medication Sig Dispense Refill  . aspirin EC 81 MG tablet Take 1 tablet (81 mg total) by mouth daily.    . Cholecalciferol (VITAMIN D) 2000 UNITS tablet Take 2,000 Units by mouth daily.    Marland Kitchen gabapentin (NEURONTIN) 300 MG capsule Take 1 capsule (300 mg total) by mouth at bedtime. 90 capsule 4  . tamoxifen (NOLVADEX) 20 MG tablet Take 1 tablet (20 mg total) by mouth daily. 90 tablet 12  . venlafaxine XR (EFFEXOR-XR) 37.5 MG 24  hr capsule Take 1 capsule (37.5 mg total) by mouth daily with breakfast. 30 capsule 5   No current facility-administered medications for this visit.    OBJECTIVE: middle-aged white woman in no acute distress Filed Vitals:   03/04/15 1028  BP: 126/75  Pulse: 80  Temp: 98.1 F (36.7 C)  Resp: 18     Body mass index is 24.14 kg/(m^2).      ECOG FS:0 - Asymptomatic    Sclerae unicteric, pupils round and equal Oropharynx clear and moist-- no thrush or other lesions No cervical or supraclavicular adenopathy Lungs no rales or rhonchi Heart regular rate and rhythm Abd soft, nontender, positive bowel sounds MSK no focal spinal tenderness, no upper extremity lymphedema Neuro: nonfocal, well oriented, appropriate affect Breasts: The right breast is unremarkable. The left breast is status post lumpectomy and radiation. There is no evidence of local recurrence. The left axilla is benign.   LAB RESULTS:  CMP     Component Value Date/Time   NA 139 12/01/2014 1328   K 3.8 12/01/2014 1328   CO2 23 12/01/2014 1328   GLUCOSE 142* 12/01/2014 1328   BUN 15.2 12/01/2014 1328   CREATININE 0.8 12/01/2014 1328   CALCIUM 8.9 12/01/2014 1328   PROT 6.8 12/01/2014 1328   ALBUMIN 3.9 12/01/2014 1328   AST 20 12/01/2014 1328   ALT 31 12/01/2014 1328   ALKPHOS 70 12/01/2014 1328   BILITOT 0.41 12/01/2014 1328   I No results found for: SPEP  Lab Results  Component Value Date   WBC 4.9 03/04/2015   NEUTROABS 3.1 03/04/2015   HGB 13.0 03/04/2015   HCT 38.6 03/04/2015   MCV 94.8 03/04/2015   PLT 224 03/04/2015      Chemistry      Component Value Date/Time   NA 139 12/01/2014 1328   K 3.8 12/01/2014 1328   CO2 23 12/01/2014 1328   BUN 15.2 12/01/2014 1328   CREATININE 0.8 12/01/2014 1328      Component Value Date/Time   CALCIUM 8.9 12/01/2014 1328   ALKPHOS 70 12/01/2014 1328   AST 20 12/01/2014 1328   ALT 31 12/01/2014 1328   BILITOT 0.41 12/01/2014 1328       STUDIES: Repeat mammography due May 2017  ASSESSMENT: 54 y.o. BRCA negative [place] woman status post left breast biopsy for 01/18/2014 showing invasive lobular carcinoma (E-cadherin negative), estrogen receptor 70% positive, progesterone receptor 70% positive, with an MIB-1 of 7%, and no HER-2 amplification  (  1) ultrasound-guided biopsy of a suspicious lymph node in the left axilla 08/19/2012 was positive  (2) status post left lumpectomy and axillary lymph node dissection 09/09/2013 for a pT1c pN2, stage IIIa invasive lobular carcinoma, grade 1, repeat HER-2 again negative. [She had 8 of 23 axillary lymph nodes involved]  (4) Adjuvant chemotherapy consisting of doxorubicin and cyclophosphamide in dose dense fashion x4 completed 12/18/2013;followed by paclitaxel weekly x 12 completed 03/20/2014  (5) [facility] 07/01/2014  (6) started tamoxifen 08/01/2014  (a) the patient is status post simple hysterectomy, no salpingo-oophorectomy  (7) hot flashes - gabapentin 317m nightly, to begin venlafaxine daily.   PLAN: Laryah is tolerating tamoxifen well. We considered going up on the venlafaxine, but she is comfortable on the current dose and really does not want to make any changes.  The question that is how long should reveal tamoxifen. We could change to anastrozole after 2 years if she could finish in 5 years. However given the stage III status of her cancer, I think 10 years of treatment would be more reasonable. Most likely we will stay on tamoxifen for the entire 10 years. This is a plan that is attractive to her.  Of course every time she has a little problem she worries that it might be her cancer. I reassured her that calling ounces easy and we can reassure her or do more studies as needed  She gets mammography early May. She will see [place] in may and then November of next year. Starting May 2018 we will start seeing her on a yearly basis.      MChauncey Cruel MD 03/04/2015

## 2015-03-04 NOTE — Telephone Encounter (Signed)
Called and left a message with 08/2015 appointment

## 2015-04-05 ENCOUNTER — Encounter: Payer: Self-pay | Admitting: Nurse Practitioner

## 2015-04-05 ENCOUNTER — Ambulatory Visit (HOSPITAL_BASED_OUTPATIENT_CLINIC_OR_DEPARTMENT_OTHER): Payer: BLUE CROSS/BLUE SHIELD | Admitting: Nurse Practitioner

## 2015-04-05 ENCOUNTER — Other Ambulatory Visit: Payer: Self-pay

## 2015-04-05 ENCOUNTER — Telehealth: Payer: Self-pay | Admitting: Nurse Practitioner

## 2015-04-05 ENCOUNTER — Other Ambulatory Visit (HOSPITAL_BASED_OUTPATIENT_CLINIC_OR_DEPARTMENT_OTHER): Payer: BLUE CROSS/BLUE SHIELD

## 2015-04-05 VITALS — BP 120/70 | HR 86 | Temp 97.8°F | Resp 20 | Ht 66.0 in | Wt 147.8 lb

## 2015-04-05 DIAGNOSIS — C50912 Malignant neoplasm of unspecified site of left female breast: Secondary | ICD-10-CM | POA: Diagnosis not present

## 2015-04-05 DIAGNOSIS — C50212 Malignant neoplasm of upper-inner quadrant of left female breast: Secondary | ICD-10-CM

## 2015-04-05 DIAGNOSIS — R591 Generalized enlarged lymph nodes: Secondary | ICD-10-CM

## 2015-04-05 DIAGNOSIS — N951 Menopausal and female climacteric states: Secondary | ICD-10-CM | POA: Diagnosis not present

## 2015-04-05 DIAGNOSIS — R590 Localized enlarged lymph nodes: Secondary | ICD-10-CM

## 2015-04-05 DIAGNOSIS — C773 Secondary and unspecified malignant neoplasm of axilla and upper limb lymph nodes: Secondary | ICD-10-CM

## 2015-04-05 DIAGNOSIS — Z170 Estrogen receptor positive status [ER+]: Secondary | ICD-10-CM

## 2015-04-05 LAB — CBC WITH DIFFERENTIAL/PLATELET
BASO%: 0.3 % (ref 0.0–2.0)
Basophils Absolute: 0 10*3/uL (ref 0.0–0.1)
EOS ABS: 0 10*3/uL (ref 0.0–0.5)
EOS%: 0.3 % (ref 0.0–7.0)
HCT: 38.1 % (ref 34.8–46.6)
HEMOGLOBIN: 12.9 g/dL (ref 11.6–15.9)
LYMPH%: 5.5 % — ABNORMAL LOW (ref 14.0–49.7)
MCH: 31.5 pg (ref 25.1–34.0)
MCHC: 33.8 g/dL (ref 31.5–36.0)
MCV: 93.3 fL (ref 79.5–101.0)
MONO#: 0.5 10*3/uL (ref 0.1–0.9)
MONO%: 4.2 % (ref 0.0–14.0)
NEUT#: 11.7 10*3/uL — ABNORMAL HIGH (ref 1.5–6.5)
NEUT%: 89.7 % — ABNORMAL HIGH (ref 38.4–76.8)
Platelets: 272 10*3/uL (ref 145–400)
RBC: 4.08 10*6/uL (ref 3.70–5.45)
RDW: 12.6 % (ref 11.2–14.5)
WBC: 13.1 10*3/uL — AB (ref 3.9–10.3)
lymph#: 0.7 10*3/uL — ABNORMAL LOW (ref 0.9–3.3)

## 2015-04-05 LAB — COMPREHENSIVE METABOLIC PANEL
ALBUMIN: 3.7 g/dL (ref 3.5–5.0)
ALK PHOS: 67 U/L (ref 40–150)
ALT: 49 U/L (ref 0–55)
AST: 28 U/L (ref 5–34)
Anion Gap: 10 mEq/L (ref 3–11)
BILIRUBIN TOTAL: 0.31 mg/dL (ref 0.20–1.20)
BUN: 16.4 mg/dL (ref 7.0–26.0)
CO2: 23 meq/L (ref 22–29)
Calcium: 9.2 mg/dL (ref 8.4–10.4)
Chloride: 104 mEq/L (ref 98–109)
Creatinine: 1 mg/dL (ref 0.6–1.1)
EGFR: 64 mL/min/{1.73_m2} — ABNORMAL LOW (ref >90)
GLUCOSE: 141 mg/dL — AB (ref 70–140)
Potassium: 4.4 mEq/L (ref 3.5–5.1)
SODIUM: 137 meq/L (ref 136–145)
TOTAL PROTEIN: 7.6 g/dL (ref 6.4–8.3)

## 2015-04-05 NOTE — Progress Notes (Signed)
[place]  [phone] [phone]     ID: [name] [name] OB: 1961-03-21  MR#: [id number]  IOX#:[id number]  PCP: [name] [name]., MD GYN:  [name] [name] SU: Excell Seltzer OTHER MD: [name] [name]  CHIEF COMPLAINT: Locally advanced breast cancer   CURRENT TREATMENT:  tamoxifen  BREAST CANCER HISTORY: From the original intake note  "[name] [name] is 54 years old and she first noted a change, like a small hard pea in her left axilla. She brought this to Dr. [name] attention and bilateral diagnostic mammography and left ultrasonography at the [facility] Center for 07/18/2013 showed a small area of asymmetry in the inner left breast measuring perhaps 5 mm which was not palpable by physical exam. In the right breast there was some loosely grouped calcifications spanning up to 0.7 cm. Ultrasound of the left breast confirmed an ill-defined area of shadowing at the 9:00 position measuring up to 1.3 cm. There was also a 4 mm circumscribed hypoechoic mass at the 10:00 position. This is felt to be probably benign. In the left axilla there were 2 lymph nodes with slightly increased cortical thickening, although the fatty hila were maintained. These were not related to the patient's feeling of a "pea like mass" in her left axilla, which had by then pretty much resolved.   On 08/07/2013 the patient underwent left breast needle core biopsy and this showed ([id number]) and invasive lobular carcinoma (E-cadherin negative) estrogen receptor 70% positive with strong staining intensity, progesterone receptor 70% positive and strong staining intensity, with a proliferation marker of 7% and no HER-2 amplification, the signals ratio being 0.94 in the number per cell 1.50.  Bilateral breast MRI 08/15/2013 found an irregular spiculated mass in the left breast measuring approximately 1.5 cm. There was no abnormal enhancement of the pectoralis. There were at least 2 left axillary lymph nodes and  showed focal cortical thickening. There was a single left internal mammary lymph node at the level of the biopsy-proven malignancy which was not pathologic in appearance. The right breast and axilla were benign.  Genetics testing may 2015 was normal and did not reveal a mutation in the genes tested: ATM, BARD1, BRCA1, BRCA2, BRIP1, CDH1, CHEK2, MRE11A, MUTYH, NBN, NF1, PALB2, PTEN, RAD50, RAD51C, RAD51D, and TP53. She is status post left lumpectomy and axillary lymph node dissection by Dr Excell Seltzer 09/09/2013 for a pT1c pN2, stage IIIa invasive lobular carcinoma, grade 1, repeat HER-2 again negative. Patient had 8/23 LN positive and also evidence of LVI (lymphovascular invasion) and PNI (perineural invasion)."  Her subsequent history is as detailed below  INTERVAL HISTORY: [name] [name] returns today for follow-up of her breast cancer, accompanied by her husband [name] [name]. This was an urgent appointment due to auricular and cervical chain lymphadenopathy and pain. she noticed this during the middle of last week. Over the weekend she had a temperature of 100.8. She visited her PCP who tested her for mono, but that was negative. Deciding this was likely infectious, she was started on prednisone taper and septra after an IM injection of steroids. The right lymph nodes are shrinking, but new nodes on the left side have popped up. She denies sinus symptoms, headaches, earaches, dizziness, or vision changes. She has visited her dentist in the last 6 months and has no dental issues or pending work that she is aware of.   REVIEW OF SYSTEMS: [name] continues on tamoxifen daily, and tolerates this well. The gabapentin and venlafaxine are helpful for her hot flashes. She denies vaginal changes.  She is usually constipated.   A detailed review of systems today was otherwise noncontributory  PAST MEDICAL HISTORY: Past Medical History  Diagnosis Date  . Palpitations 2009    Improved; Secondary to premature ventricular  contractions. No problems since-pt states was caring for ailing father, anxiety  . Anxiety disorder     Mild: treated by her gynecologist with selective serotonin reuptake inhibitor  . Nonspecific abnormal electrocardiogram (ECG) (EKG) 2009  . GERD (gastroesophageal reflux disease)     rolaids  . Hot flashes   . Family history of malignant neoplasm of breast   . Arthritis     hands and feet  . Wears glasses   . Malignant neoplasm of breast (female), unspecified site 08/07/13    left breast invasive mammary ca in situ  . Anxiety     PAST SURGICAL HISTORY: Past Surgical History  Procedure Laterality Date  . Thyroidectomy, partial    . Cesarean section    . Endometrial ablation    . Dilation and curettage of uterus    . Laparoscopic assisted vaginal hysterectomy  02/07/2011    Procedure: LAPAROSCOPIC ASSISTED VAGINAL HYSTERECTOMY;  Surgeon: [name] [name];  Location: [facility];  Service: Gynecology;  Laterality: N/A;  . Abdominal hysterectomy  02/07/2011    Procedure: HYSTERECTOMY ABDOMINAL;  Surgeon: [name] [name];  Location: [facility];  Service: Gynecology;  Laterality: N/A;  . Breast lumpectomy with needle localization and axillary lymph node dissection Left 09/09/2013    Procedure: LEFT BREAST LUMPECTOMY WITH NEEDLE LOCALIZATION AND LEFT AXILLARY LYMPH NODE DISSECTION;  Surgeon: [name], MD;  Location: [facility];  Service: General;  Laterality: Left;  . Portacath placement Right 09/29/2013    Procedure: INSERTION PORT-A-CATH;  Surgeon: [name], MD;  Location: [facility];  Service: General;  Laterality: Right;  . Breast lumpectomy with radioactive seed localization Right 10/07/2014    Procedure: RIGHT BREAST LUMPECTOMY WITH RADIOACTIVE SEED LOCALIZATION;  Surgeon: Excell Seltzer, MD;  Location: [facility];  Service: General;  Laterality: Right;    FAMILY HISTORY Family History  Problem Relation Age of Onset  . Angina  Father   . Aortic aneurysm Father   . Dementia Father   . Arrhythmia Maternal Aunt     A fib  . Breast cancer Maternal Aunt 56    currently 56  . Hypertension Other   . Arrhythmia Other     Uncle- a fib  . Arrhythmia Maternal Aunt     A fib  . Breast cancer Maternal Aunt     dx 39s; currently 50s  . Breast cancer Mother     dx 84s. Died at 29  . Colon cancer Maternal Grandmother     dx 56s; died at 50  . Colon cancer Maternal Grandfather     dx 74s; died in 21s  . Cancer Paternal Uncle     unknown primary in early 19s  . Breast cancer Maternal Aunt     dx 58s; died in 28s  The patient's father died from heart disease in the setting of dementia at the age of 19. The patient's mother died at the age of 63. She had been diagnosed with breast cancer at the age of 75. The patient is a single child. Her mother had 4 sisters, 3 of whom had breast cancer diagnosed at the age of 22, 60, and 75. There is also history of colorectal cancer on the maternal side of the family. BRCA testing  negative.  GYNECOLOGIC HISTORY:   menarche age 45, first live birth age 58, the patient is [place] P2. She underwent a hysterectomy without salpingo-oophorectomy in 2012. 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 Her husband [name] [name] also works for [employer], running [employer].. Evorn Gong lives in [place] where he works as a Public relations account executive. Daughter Darrick Penna this in [place], where she works as an Engineering geologist in a [employer]. The patient has no grandchildren. She is a Information systems manager.   ADVANCED DIRECTIVES: Not in place   HEALTH MAINTENANCE: Social History  Substance Use Topics  . Smoking status: Never Smoker   . Smokeless tobacco: Not on file  . Alcohol Use: Yes     Comment: drinks 1-2 beers a week     Colonoscopy:2010   PAP:2012, s/p hysterectomy.  Bone density:2012    Lipid panel: 11/05/2013 TC 219, HDL 41, LDL 137, TG 204  No Known Allergies  Current Outpatient Prescriptions  Medication Sig Dispense Refill  . aspirin EC 81 MG tablet Take 1 tablet (81 mg total) by mouth daily.    . Cholecalciferol (VITAMIN D) 2000 UNITS tablet Take 2,000 Units by mouth daily.    Marland Kitchen gabapentin (NEURONTIN) 300 MG capsule Take 1 capsule (300 mg total) by mouth at bedtime. 90 capsule 4  . predniSONE (DELTASONE) 5 MG tablet   0  . sulfamethoxazole-trimethoprim (BACTRIM DS,SEPTRA DS) 800-160 MG tablet   0  . tamoxifen (NOLVADEX) 20 MG tablet Take 1 tablet (20 mg total) by mouth daily. 90 tablet 12  . venlafaxine XR (EFFEXOR-XR) 37.5 MG 24 hr capsule Take 1 capsule (37.5 mg total) by mouth daily with breakfast. 30 capsule 5   No current facility-administered medications for this visit.    OBJECTIVE: middle-aged white woman in no acute distress Filed Vitals:   04/05/15 1411  BP: 120/70  Pulse: 86  Temp: 97.8 F (36.6 C)  Resp: 20     Body mass index is 23.87 kg/(m^2).      ECOG FS:0 - Asymptomatic   Skin: warm, dry  HEENT: sclerae anicteric, conjunctivae pink, oropharynx clear. No thrush or mucositis.  Lymph Nodes: left preauricular and parotid lymph nodes palpable, right preauricular node barely palpated. Tenderness to both areas and cervical chain. No cervical or supraclavicular lymphadenopathy Lungs: clear to auscultation bilaterally, no rales, wheezes, or rhonci  Heart: regular rate and rhythm  Abdomen: round, soft, non tender, positive bowel sounds  Musculoskeletal: No focal spinal tenderness, no peripheral edema  Neuro: non focal, well oriented, positive affect  Breasts: left breast status post lumpectomy and radiation. No evidence of recurrent disease. Left axilla benign. Right breast unremarkable.  LAB RESULTS:  CMP     Component Value Date/Time   NA 137 04/05/2015 1357   K 4.4 04/05/2015 1357   CO2 23 04/05/2015 1357   GLUCOSE 141* 04/05/2015 1357    BUN 16.4 04/05/2015 1357   CREATININE 1.0 04/05/2015 1357   CALCIUM 9.2 04/05/2015 1357   PROT 7.6 04/05/2015 1357   ALBUMIN 3.7 04/05/2015 1357   AST 28 04/05/2015 1357   ALT 49 04/05/2015 1357   ALKPHOS 67 04/05/2015 1357   BILITOT 0.31 04/05/2015 1357   I No results found for: SPEP  Lab Results  Component Value Date   WBC 13.1* 04/05/2015   NEUTROABS 11.7* 04/05/2015   HGB 12.9 04/05/2015   HCT 38.1  04/05/2015   MCV 93.3 04/05/2015   PLT 272 04/05/2015      Chemistry      Component Value Date/Time   NA 137 04/05/2015 1357   K 4.4 04/05/2015 1357   CO2 23 04/05/2015 1357   BUN 16.4 04/05/2015 1357   CREATININE 1.0 04/05/2015 1357      Component Value Date/Time   CALCIUM 9.2 04/05/2015 1357   ALKPHOS 67 04/05/2015 1357   AST 28 04/05/2015 1357   ALT 49 04/05/2015 1357   BILITOT 0.31 04/05/2015 1357      STUDIES: Repeat mammography due May 2017  ASSESSMENT: 54 y.o. BRCA negative [place] woman status post left breast biopsy for 01/18/2014 showing invasive lobular carcinoma (E-cadherin negative), estrogen receptor 70% positive, progesterone receptor 70% positive, with an MIB-1 of 7%, and no HER-2 amplification  (1) ultrasound-guided biopsy of a suspicious lymph node in the left axilla 08/19/2012 was positive  (2) status post left lumpectomy and axillary lymph node dissection 09/09/2013 for a pT1c pN2, stage IIIa invasive lobular carcinoma, grade 1, repeat HER-2 again negative. [She had 8 of 23 axillary lymph nodes involved]  (4) Adjuvant chemotherapy consisting of doxorubicin and cyclophosphamide in dose dense fashion x4 completed 12/18/2013;followed by paclitaxel weekly x 12 completed 03/20/2014  (5) radiation completed [facility] 07/01/2014  (6) started tamoxifen 08/01/2014  (a) the patient is status post simple hysterectomy, no salpingo-oophorectomy  (7) hot flashes - gabapentin 354m nightly, to begin venlafaxine daily.   PLAN: I consulted with Dr.  MJana Hakimregarding Quina's current symptoms. He believe there is an underlying infectious process, and that her lymph nodes are simply reacting to this. Her WBC count is elevated likely secondary to steroid use. He believes her lymphadenopathy will subside while on the steroids and septra within the next week. We will hold off on ordering scans. She will return in 1 week for follow up. If the lymphadenopathy persists we will consider a CT of the neck and chest. She understands and agrees with this plan. She has been encouraged to call with any issues that might arise before her next visit here.    HLaurie Panda NP 04/05/2015

## 2015-04-05 NOTE — Telephone Encounter (Signed)
Appointments made and avs has been printed for patient

## 2015-04-12 ENCOUNTER — Telehealth: Payer: Self-pay | Admitting: Nurse Practitioner

## 2015-04-12 NOTE — Telephone Encounter (Signed)
Patient called in and left a message to cancel her appointment and states that heather is aware that she is cancelling  anne

## 2015-04-13 ENCOUNTER — Telehealth: Payer: Self-pay | Admitting: Oncology

## 2015-04-13 ENCOUNTER — Other Ambulatory Visit: Payer: Self-pay

## 2015-04-13 DIAGNOSIS — C50212 Malignant neoplasm of upper-inner quadrant of left female breast: Secondary | ICD-10-CM

## 2015-04-13 NOTE — Telephone Encounter (Signed)
Patient called re cancelling 12/14 lab/HB. Per patient will call back to r/s at a later date.

## 2015-04-14 ENCOUNTER — Other Ambulatory Visit: Payer: BLUE CROSS/BLUE SHIELD

## 2015-04-14 ENCOUNTER — Ambulatory Visit: Payer: BLUE CROSS/BLUE SHIELD | Admitting: Nurse Practitioner

## 2015-06-08 ENCOUNTER — Other Ambulatory Visit: Payer: Self-pay | Admitting: Oncology

## 2015-06-08 ENCOUNTER — Other Ambulatory Visit: Payer: Self-pay

## 2015-06-08 DIAGNOSIS — Z1231 Encounter for screening mammogram for malignant neoplasm of breast: Secondary | ICD-10-CM

## 2015-06-08 DIAGNOSIS — Z853 Personal history of malignant neoplasm of breast: Secondary | ICD-10-CM

## 2015-07-13 ENCOUNTER — Encounter: Payer: Self-pay | Admitting: Oncology

## 2015-08-10 ENCOUNTER — Ambulatory Visit (INDEPENDENT_AMBULATORY_CARE_PROVIDER_SITE_OTHER): Payer: BLUE CROSS/BLUE SHIELD | Admitting: Family Medicine

## 2015-08-10 ENCOUNTER — Encounter: Payer: Self-pay | Admitting: Family Medicine

## 2015-08-10 VITALS — BP 122/77 | HR 74 | Temp 97.9°F | Resp 20 | Ht 66.0 in | Wt 155.2 lb

## 2015-08-10 DIAGNOSIS — R899 Unspecified abnormal finding in specimens from other organs, systems and tissues: Secondary | ICD-10-CM | POA: Insufficient documentation

## 2015-08-10 DIAGNOSIS — Z7689 Persons encountering health services in other specified circumstances: Secondary | ICD-10-CM

## 2015-08-10 DIAGNOSIS — Z79810 Long term (current) use of selective estrogen receptor modulators (SERMs): Secondary | ICD-10-CM | POA: Insufficient documentation

## 2015-08-10 DIAGNOSIS — N951 Menopausal and female climacteric states: Secondary | ICD-10-CM

## 2015-08-10 DIAGNOSIS — R232 Flushing: Secondary | ICD-10-CM

## 2015-08-10 DIAGNOSIS — Z0000 Encounter for general adult medical examination without abnormal findings: Secondary | ICD-10-CM

## 2015-08-10 DIAGNOSIS — Z7189 Other specified counseling: Secondary | ICD-10-CM

## 2015-08-10 DIAGNOSIS — R9431 Abnormal electrocardiogram [ECG] [EKG]: Secondary | ICD-10-CM | POA: Diagnosis not present

## 2015-08-10 DIAGNOSIS — R740 Nonspecific elevation of levels of transaminase and lactic acid dehydrogenase [LDH]: Secondary | ICD-10-CM

## 2015-08-10 DIAGNOSIS — L989 Disorder of the skin and subcutaneous tissue, unspecified: Secondary | ICD-10-CM | POA: Insufficient documentation

## 2015-08-10 DIAGNOSIS — R7401 Elevation of levels of liver transaminase levels: Secondary | ICD-10-CM

## 2015-08-10 DIAGNOSIS — E0789 Other specified disorders of thyroid: Secondary | ICD-10-CM

## 2015-08-10 HISTORY — DX: Disorder of the skin and subcutaneous tissue, unspecified: L98.9

## 2015-08-10 HISTORY — DX: Long term (current) use of selective estrogen receptor modulators (serms): Z79.810

## 2015-08-10 NOTE — Progress Notes (Addendum)
Patient ID: [name] [name], female   DOB: [birth year], 55 y.o.   MRN: [id number]      Patient ID: [name] [name], female  DOB: 1960-05-31, 56 y.o.   MRN: [id number]  Subjective:  [name] [name] is a 55 y.o. female present for establishment of care.  All past medical history, surgical history, allergies, family history, immunizations, medications and social history were updated and entered in the electronic medical record today. All recent labs, ED visits and hospitalizations within the last year were reviewed.  Health maintenance:  Colonoscopy: Dr. Lindalou Hose (retired) [facility]. 05/2007; 10 year f/u.  Mammogram: 09/02/2014; yearly. Left Breast cancer 2015. Tamoxifen use (started 03/2014).  Cervical cancer screening:Dr. Julien Girt, [facility], 10/2014. UTD/hysterectomy for fibroids.  Immunizations: Declines Flu, PNA not indicated at this time, Tdap unknown. Infectious disease screening: HIV and Hep C indicated. DEXA: completed 06/30/2014. Osteopenia. Taking Vit D and Ca use. Estrogen deficiency. Current Tamoxifen use.   05/2015 labs from outside source: Lipid panel: Ch 160, HDL 34, trig 95, ldl 101 Cbc: WNL with the exception of mildly low lymphocytes CMP: Glucose 88, cr 0.75, gfr 89, sodium 141, potassium 4.0, chloride 104, C022, albumin 4.0,bili 0.4, alk phos 54, ast 27, alt 35 Mag WNL TSH: 2.68 (WNL) Vit d: 31 Past Medical History  Diagnosis Date  . Palpitations 2009    Improved; Secondary to premature ventricular contractions. No problems since-pt states was caring for ailing father, anxiety  . Anxiety disorder     Mild: treated by her gynecologist with selective serotonin reuptake inhibitor  . Nonspecific abnormal electrocardiogram (ECG) (EKG) 2009  . GERD (gastroesophageal reflux disease)     rolaids  . Hot flashes   . Family history of malignant neoplasm of breast   . Arthritis     hands and feet  . Wears glasses   . Malignant neoplasm of breast (female), unspecified site 08/07/13    left  breast invasive mammary ca in situ  . Anxiety   . Hyperlipidemia   . Fibroid uterus     hysterctomy  . Osteoarthritis   . Tick bite of right thigh 2016   No Known Allergies Past Surgical History  Procedure Laterality Date  . Thyroidectomy, partial    . Cesarean section    . Endometrial ablation  2006  . Dilation and curettage of uterus    . Laparoscopic assisted vaginal hysterectomy  02/07/2011    Procedure: LAPAROSCOPIC ASSISTED VAGINAL HYSTERECTOMY;  Surgeon: [name];  Location: [facility];  Service: Gynecology;  Laterality: N/A;  . Abdominal hysterectomy  02/07/2011    Procedure: HYSTERECTOMY ABDOMINAL;  Surgeon: [name];  Location: [facility];  Service: Gynecology;  Laterality: N/A;  . Breast lumpectomy with needle localization and axillary lymph node dissection Left 09/09/2013    Procedure: LEFT BREAST LUMPECTOMY WITH NEEDLE LOCALIZATION AND LEFT AXILLARY LYMPH NODE DISSECTION;  Surgeon: [name], MD;  Location: [facility];  Service: General;  Laterality: Left;  . Portacath placement Right 09/29/2013    Procedure: INSERTION PORT-A-CATH;  Surgeon: [name], MD;  Location: [facility];  Service: General;  Laterality: Right;  . Breast lumpectomy with radioactive seed localization Right 10/07/2014    Procedure: RIGHT BREAST LUMPECTOMY WITH RADIOACTIVE SEED LOCALIZATION;  Surgeon: Excell Seltzer, MD;  Location: [facility];  Service: General;  Laterality: Right;   Family History  Problem Relation Age of Onset  . Angina Father   . Aortic aneurysm Father   . Dementia Father   .  CAD Father   . Arrhythmia Maternal Aunt     A fib  . Breast cancer Maternal Aunt 56    currently 20  . Thyroid disease Maternal Aunt   . Hypertension Other   . Arrhythmia Other     Uncle- a fib  . Arrhythmia Maternal Aunt     A fib  . Breast cancer Maternal Aunt     dx 32s; currently 41s  . Breast cancer Mother     dx 53s. Died at  1  . Non-Hodgkin's lymphoma Mother   . Colon cancer Maternal Grandmother     dx 48s; died at 80  . Thyroid disease Maternal Grandmother   . Colon cancer Maternal Grandfather     dx 12s; died in 51s  . Cancer Paternal Uncle     unknown primary in early 37s  . Breast cancer Maternal Aunt     dx 4s; died in 15s  . Thyroid disease Maternal Uncle    Social History   Social History  . Marital Status: Married    Spouse Name: N/A  . Number of Children: N/A  . Years of Education: N/A   Occupational History  . Register of deeds     Full time   Social History Main Topics  . Smoking status: Never Smoker   . Smokeless tobacco: Never Used  . Alcohol Use: 2.4 - 3.0 oz/week    4-5 Cans of beer per week  . 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 Sexual Activity: Yes    Birth Control/ Protection: Surgical   Other Topics Concern  . Not on file   Social History Narrative   Married, [name]. 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    Drinks caffeine beverages   Wears seatbelt, smoke detector in the home   Firearms in the home   Exercises >3 x week.    Feels safe in relationships.      ROS: Negative, with the exception of above mentioned in HPI  Objective: BP 122/77 mmHg  Pulse 74  Temp(Src) 97.9 F (36.6 C) (Oral)  Resp 20  Ht _0  (1.676 m)  Wt 155 lb 4 oz (70.421 kg)  BMI 25.07 kg/m2  SpO2 99% Gen: Afebrile. No acute distress. Nontoxic in appearance, well-developed, well-nourished, female, very pleasant.  HENT: AT. [place]. Bilateral TM visualized and normal in appearance, normal external auditory canal. MMM, no oral lesions Eyes:Pupils Equal Round Reactive to light, Extraocular movements intact,  Conjunctiva without redness, discharge or icterus. Neck/lymp/endocrine: Supple, no  Lymphadenopathy, right thyroid enlargement; partial thyroidectomy scar.    CV: RRR, no murmur appreciated, trace edema Chest: CTAB, no wheeze, rhonchi or crackles.  Abd: Soft. flat. NTND. BS present Skin:Warm and well-perfused. Skin intact. Small area of shiny, rough round erythema right forearm and left shoulder.  Neuro/Msk: Normal gait. PERLA. EOMi. Alert. Oriented x3.   Psych: Normal affect, dress and demeanor. Normal speech. Normal thought content and judgment.  Assessment/plan: [name] [name] is a 55 y.o. female present for establishment of care Hot flashes - continue gabapentin and Effexor low dose.   Use of tamoxifen (Nolvadex) - Started 03/2014. Follows with Dr. Excell Seltzer and Dr. Jana Hakim.   Thyroid fullness/enlargement - TSH normal 05/2015 - H/O of left partial thyroidectomy (non-cancerous) - US Soft Tissue Head/Neck; Future  Skin Lesions: - lesions appear  consistent for BCC. Will refer to dermatology today.    CPE 1-2 months (no fasting labs needed)  Greater than 45 minutes was spent with patient, greater than 50% of that time was spent face-to-face with patient counseling and coordinating care.  Electronically signed by: Howard Pouch, DO [place]

## 2015-08-10 NOTE — Patient Instructions (Signed)
It was a pleasure meeting you today. We will set up your [place] of your thyroid.  Schedule complete physical for screening labs and tdap (if needed) within 1-2 months.  We will call you with the [place] results once available.  Take 2000u of vit d and 1200 calcium.

## 2015-08-10 NOTE — Addendum Note (Signed)
Addended by: Howard Pouch A on: 08/10/2015 04:59 PM   Modules accepted: Orders

## 2015-08-11 ENCOUNTER — Ambulatory Visit (HOSPITAL_COMMUNITY)
Admission: RE | Admit: 2015-08-11 | Discharge: 2015-08-11 | Disposition: A | Discharge status: Home / Self Care | Payer: BLUE CROSS/BLUE SHIELD | Source: Ambulatory Visit | Attending: Family Medicine | Admitting: Family Medicine

## 2015-08-11 DIAGNOSIS — E042 Nontoxic multinodular goiter: Secondary | ICD-10-CM | POA: Insufficient documentation

## 2015-08-11 DIAGNOSIS — E0789 Other specified disorders of thyroid: Secondary | ICD-10-CM | POA: Diagnosis present

## 2015-08-12 ENCOUNTER — Telehealth: Payer: Self-pay | Admitting: Family Medicine

## 2015-08-12 NOTE — Telephone Encounter (Signed)
Please call patient: Her thyroid ultrasound did show some small nodules in her right thyroid lobe, as we suspected. They do not seem to be concerning at this time and do not meet criteria for biopsy. However we will need to follow-up with a repeat ultrasound in 12 months.

## 2015-08-12 NOTE — Telephone Encounter (Signed)
Spoke with patient to review [place] results and instructions Patient verbalized understanding.

## 2015-09-07 ENCOUNTER — Ambulatory Visit
Admission: RE | Admit: 2015-09-07 | Discharge: 2015-09-07 | Disposition: A | Discharge status: Home / Self Care | Payer: BLUE CROSS/BLUE SHIELD | Source: Ambulatory Visit | Attending: Oncology | Admitting: Oncology

## 2015-09-07 ENCOUNTER — Other Ambulatory Visit: Payer: Self-pay | Admitting: Oncology

## 2015-09-07 ENCOUNTER — Ambulatory Visit: Payer: BLUE CROSS/BLUE SHIELD

## 2015-09-07 ENCOUNTER — Telehealth: Payer: Self-pay | Admitting: Nurse Practitioner

## 2015-09-07 ENCOUNTER — Other Ambulatory Visit: Payer: Self-pay | Admitting: Nurse Practitioner

## 2015-09-07 DIAGNOSIS — R928 Other abnormal and inconclusive findings on diagnostic imaging of breast: Secondary | ICD-10-CM

## 2015-09-07 DIAGNOSIS — Z853 Personal history of malignant neoplasm of breast: Secondary | ICD-10-CM

## 2015-09-07 NOTE — Telephone Encounter (Signed)
lmom to inform patient of new appt date/time per 5/9 pof

## 2015-09-13 ENCOUNTER — Other Ambulatory Visit: Payer: BLUE CROSS/BLUE SHIELD

## 2015-09-13 ENCOUNTER — Other Ambulatory Visit: Payer: Self-pay | Admitting: Oncology

## 2015-09-13 ENCOUNTER — Ambulatory Visit: Payer: BLUE CROSS/BLUE SHIELD | Admitting: Nurse Practitioner

## 2015-09-13 ENCOUNTER — Ambulatory Visit
Admission: RE | Admit: 2015-09-13 | Discharge: 2015-09-13 | Disposition: A | Discharge status: Home / Self Care | Payer: BLUE CROSS/BLUE SHIELD | Source: Ambulatory Visit | Attending: Oncology | Admitting: Oncology

## 2015-09-13 ENCOUNTER — Encounter: Payer: BLUE CROSS/BLUE SHIELD | Admitting: Family Medicine

## 2015-09-13 DIAGNOSIS — R928 Other abnormal and inconclusive findings on diagnostic imaging of breast: Secondary | ICD-10-CM

## 2015-09-22 ENCOUNTER — Encounter: Payer: Self-pay | Admitting: Nurse Practitioner

## 2015-09-22 ENCOUNTER — Ambulatory Visit (HOSPITAL_BASED_OUTPATIENT_CLINIC_OR_DEPARTMENT_OTHER): Payer: BLUE CROSS/BLUE SHIELD | Admitting: Nurse Practitioner

## 2015-09-22 ENCOUNTER — Other Ambulatory Visit (HOSPITAL_BASED_OUTPATIENT_CLINIC_OR_DEPARTMENT_OTHER): Payer: BLUE CROSS/BLUE SHIELD

## 2015-09-22 VITALS — BP 116/73 | HR 77 | Temp 98.8°F | Resp 18 | Ht 66.0 in | Wt 155.9 lb

## 2015-09-22 DIAGNOSIS — N951 Menopausal and female climacteric states: Secondary | ICD-10-CM | POA: Diagnosis not present

## 2015-09-22 DIAGNOSIS — Z170 Estrogen receptor positive status [ER+]: Secondary | ICD-10-CM

## 2015-09-22 DIAGNOSIS — C773 Secondary and unspecified malignant neoplasm of axilla and upper limb lymph nodes: Secondary | ICD-10-CM

## 2015-09-22 DIAGNOSIS — C50812 Malignant neoplasm of overlapping sites of left female breast: Secondary | ICD-10-CM

## 2015-09-22 DIAGNOSIS — C50212 Malignant neoplasm of upper-inner quadrant of left female breast: Secondary | ICD-10-CM

## 2015-09-22 DIAGNOSIS — Z9889 Other specified postprocedural states: Secondary | ICD-10-CM

## 2015-09-22 DIAGNOSIS — R928 Other abnormal and inconclusive findings on diagnostic imaging of breast: Secondary | ICD-10-CM

## 2015-09-22 LAB — COMPREHENSIVE METABOLIC PANEL
ALT: 24 U/L (ref 0–55)
ANION GAP: 9 meq/L (ref 3–11)
AST: 20 U/L (ref 5–34)
Albumin: 3.9 g/dL (ref 3.5–5.0)
Alkaline Phosphatase: 54 U/L (ref 40–150)
BUN: 15.9 mg/dL (ref 7.0–26.0)
CHLORIDE: 105 meq/L (ref 98–109)
CO2: 26 meq/L (ref 22–29)
CREATININE: 0.8 mg/dL (ref 0.6–1.1)
Calcium: 9.2 mg/dL (ref 8.4–10.4)
EGFR: 78 mL/min/{1.73_m2} — AB (ref >90)
Glucose: 109 mg/dl (ref 70–140)
POTASSIUM: 4 meq/L (ref 3.5–5.1)
Sodium: 140 mEq/L (ref 136–145)
Total Bilirubin: 0.3 mg/dL (ref 0.20–1.20)
Total Protein: 7.1 g/dL (ref 6.4–8.3)

## 2015-09-22 LAB — CBC WITH DIFFERENTIAL/PLATELET
BASO%: 0.5 % (ref 0.0–2.0)
BASOS ABS: 0 10*3/uL (ref 0.0–0.1)
EOS%: 2.6 % (ref 0.0–7.0)
Eosinophils Absolute: 0.1 10*3/uL (ref 0.0–0.5)
HEMATOCRIT: 37.9 % (ref 34.8–46.6)
HEMOGLOBIN: 12.8 g/dL (ref 11.6–15.9)
LYMPH#: 1.3 10*3/uL (ref 0.9–3.3)
LYMPH%: 24.3 % (ref 14.0–49.7)
MCH: 31.8 pg (ref 25.1–34.0)
MCHC: 33.9 g/dL (ref 31.5–36.0)
MCV: 93.7 fL (ref 79.5–101.0)
MONO#: 0.4 10*3/uL (ref 0.1–0.9)
MONO%: 7.7 % (ref 0.0–14.0)
NEUT#: 3.5 10*3/uL (ref 1.5–6.5)
NEUT%: 64.9 % (ref 38.4–76.8)
PLATELETS: 244 10*3/uL (ref 145–400)
RBC: 4.04 10*6/uL (ref 3.70–5.45)
RDW: 12.4 % (ref 11.2–14.5)
WBC: 5.4 10*3/uL (ref 3.9–10.3)

## 2015-09-22 NOTE — Progress Notes (Signed)
[place]  [phone] [phone]     ID: [name] OB: [birth year]  MR#: [id number]  FWY#:[id number]  PCP: Howard Pouch, DO GYN:  [name] SU: Excell Seltzer OTHER MD: [name]  CHIEF COMPLAINT: Locally advanced breast cancer   CURRENT TREATMENT:  tamoxifen  BREAST CANCER HISTORY: From the original intake note  "[name] [name] is 55 years old and she first noted a change, like a small hard pea in her left axilla. She brought this to Dr. [name] attention and bilateral diagnostic mammography and left ultrasonography at the [facility] Center for 07/18/2013 showed a small area of asymmetry in the inner left breast measuring perhaps 5 mm which was not palpable by physical exam. In the right breast there was some loosely grouped calcifications spanning up to 0.7 cm. Ultrasound of the left breast confirmed an ill-defined area of shadowing at the 9:00 position measuring up to 1.3 cm. There was also a 4 mm circumscribed hypoechoic mass at the 10:00 position. This is felt to be probably benign. In the left axilla there were 2 lymph nodes with slightly increased cortical thickening, although the fatty hila were maintained. These were not related to the patient's feeling of a "pea like mass" in her left axilla, which had by then pretty much resolved.   On 08/07/2013 the patient underwent left breast needle core biopsy and this showed ([id number]) and invasive lobular carcinoma (E-cadherin negative) estrogen receptor 70% positive with strong staining intensity, progesterone receptor 70% positive and strong staining intensity, with a proliferation marker of 7% and no HER-2 amplification, the signals ratio being 0.94 in the number per cell 1.50.  Bilateral breast MRI 08/15/2013 found an irregular spiculated mass in the left breast measuring approximately 1.5 cm. There was no abnormal enhancement of the pectoralis. There were at least 2 left axillary lymph nodes and  showed focal cortical thickening. There was a single left internal mammary lymph node at the level of the biopsy-proven malignancy which was not pathologic in appearance. The right breast and axilla were benign.  Genetics testing may 2015 was normal and did not reveal a mutation in the genes tested: ATM, BARD1, BRCA1, BRCA2, BRIP1, CDH1, CHEK2, MRE11A, MUTYH, NBN, NF1, PALB2, PTEN, RAD50, RAD51C, RAD51D, and TP53. She is status post left lumpectomy and axillary lymph node dissection by Dr Excell Seltzer 09/09/2013 for a pT1c pN2, stage IIIa invasive lobular carcinoma, grade 1, repeat HER-2 again negative. Patient had 8/23 LN positive and also evidence of LVI (lymphovascular invasion) and PNI (perineural invasion)."  Her subsequent history is as detailed below  INTERVAL HISTORY: [name] [name] returns today for follow-up of her breast cancer, alone. She has been on tamoxifen since April 2016 and tolerates this drug well. Her hot flashes are no better, but also no worse. She takes '300mg'$  gabapentin QHS. She denies vaginal changes. The interval history is remarkable for a biopsy of a suspicious distortion in the left breast, further away from the lumpectomy site than typical to convincingly represent a post surgical scar. The biopsy results revealed fibrocystic changes and calcification, but she was told by the radiologist to follow up with Dr. Excell Seltzer because the radiologist found this to be discordant. She did not keep that appointment unfortunately.   REVIEW OF SYSTEMS: Besides chronic constipation and occasional right shoulder pain, a detailed review of systems today was otherwise noncontributory.   PAST MEDICAL HISTORY: Past Medical History  Diagnosis Date  . Palpitations 2009    Improved; Secondary to premature ventricular contractions.  No problems since-pt states was caring for ailing father, anxiety  . Anxiety disorder     Mild: treated by her gynecologist with selective serotonin reuptake inhibitor  .  Nonspecific abnormal electrocardiogram (ECG) (EKG) 2009  . GERD (gastroesophageal reflux disease)     rolaids  . Hot flashes   . Family history of malignant neoplasm of breast   . Arthritis     hands and feet  . Wears glasses   . Malignant neoplasm of breast (female), unspecified site 08/07/13    left breast invasive mammary ca in situ  . Anxiety   . Hyperlipidemia   . Fibroid uterus     hysterctomy  . Osteoarthritis   . Tick bite of right thigh 2016    PAST SURGICAL HISTORY: Past Surgical History  Procedure Laterality Date  . Thyroidectomy, partial    . Cesarean section    . Endometrial ablation  2006  . Dilation and curettage of uterus    . Laparoscopic assisted vaginal hysterectomy  02/07/2011    Procedure: LAPAROSCOPIC ASSISTED VAGINAL HYSTERECTOMY;  Surgeon: Zelphia Cairo;  Location: [facility];  Service: Gynecology;  Laterality: N/A;  . Abdominal hysterectomy  02/07/2011    Procedure: HYSTERECTOMY ABDOMINAL;  Surgeon: Zelphia Cairo;  Location: [facility];  Service: Gynecology;  Laterality: N/A;  . Breast lumpectomy with needle localization and axillary lymph node dissection Left 09/09/2013    Procedure: LEFT BREAST LUMPECTOMY WITH NEEDLE LOCALIZATION AND LEFT AXILLARY LYMPH NODE DISSECTION;  Surgeon: Mariella Saa, MD;  Location: [facility];  Service: General;  Laterality: Left;  . Portacath placement Right 09/29/2013    Procedure: INSERTION PORT-A-CATH;  Surgeon: Mariella Saa, MD;  Location: [facility];  Service: General;  Laterality: Right;  . Breast lumpectomy with radioactive seed localization Right 10/07/2014    Procedure: RIGHT BREAST LUMPECTOMY WITH RADIOACTIVE SEED LOCALIZATION;  Surgeon: Glenna Fellows, MD;  Location: [facility];  Service: General;  Laterality: Right;    FAMILY HISTORY Family History  Problem Relation Age of Onset  . Angina Father   . Aortic aneurysm Father   . Dementia Father   . CAD Father     . Arrhythmia Maternal Aunt     A fib  . Breast cancer Maternal Aunt 56    currently 45  . Thyroid disease Maternal Aunt   . Hypertension Other   . Arrhythmia Other     Uncle- a fib  . Arrhythmia Maternal Aunt     A fib  . Breast cancer Maternal Aunt     dx 40s; currently 77s  . Breast cancer Mother     dx 34s. Died at 25  . Non-Hodgkin's lymphoma Mother   . Colon cancer Maternal Grandmother     dx 76s; died at 60  . Thyroid disease Maternal Grandmother   . Colon cancer Maternal Grandfather     dx 39s; died in 52s  . Cancer Paternal Uncle     unknown primary in early 42s  . Breast cancer Maternal Aunt     dx 36s; died in 43s  . Thyroid disease Maternal Uncle   The patient's father died from heart disease in the setting of dementia at the age of 71. The patient's mother died at the age of 69. She had been diagnosed with breast cancer at the age of 11. The patient is a single child. Her mother had 4 sisters, 3 of whom had breast cancer diagnosed at the age of  56, 70, and 50. There is also history of colorectal cancer on the maternal side of the family. BRCA testing negative.  GYNECOLOGIC HISTORY:   menarche age 66, first live birth age 21, the patient is [place] P2. She underwent a hysterectomy without salpingo-oophorectomy in 2012. 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 Her husband [name] also works for [employer], running [employer].. [name] [name] lives in [place] where he works as a Public relations account executive. Daughter [name] [name] this in [place], where she works as an Engineering geologist in a [employer]. The patient has no grandchildren. She is a Information systems manager.   ADVANCED DIRECTIVES: Not in place   HEALTH MAINTENANCE: Social History  Substance Use Topics  . Smoking status: Never Smoker   . Smokeless tobacco: Never Used  . Alcohol Use: 2.4 - 3.0 oz/week    4-5 Cans of  beer per week     Colonoscopy:2010   PAP:2012, s/p hysterectomy.  Bone density:2012   Lipid panel: 11/05/2013 TC 219, HDL 41, LDL 137, TG 204  No Known Allergies  Current Outpatient Prescriptions  Medication Sig Dispense Refill  . aspirin EC 81 MG tablet Take 1 tablet (81 mg total) by mouth daily.    . Cholecalciferol (VITAMIN D) 2000 UNITS tablet Take 2,000 Units by mouth daily.    . diclofenac (VOLTAREN) 75 MG EC tablet Take 75 mg by mouth 2 (two) times daily as needed.    . gabapentin (NEURONTIN) 300 MG capsule Take 1 capsule (300 mg total) by mouth at bedtime. 90 capsule 4  . tamoxifen (NOLVADEX) 20 MG tablet Take 1 tablet (20 mg total) by mouth daily. 90 tablet 12  . venlafaxine XR (EFFEXOR-XR) 37.5 MG 24 hr capsule Take 1 capsule (37.5 mg total) by mouth daily with breakfast. 30 capsule 5   No current facility-administered medications for this visit.    OBJECTIVE: middle-aged white woman in no acute distress Filed Vitals:   09/22/15 1431  BP: 116/73  Pulse: 77  Temp: 98.8 F (37.1 C)  Resp: 18     Body mass index is 25.18 kg/(m^2).      ECOG FS:0 - Asymptomatic   Skin: warm, dry  HEENT: sclerae anicteric, conjunctivae pink, oropharynx clear. No thrush or mucositis.  Lymph Nodes: No cervical or supraclavicular lymphadenopathy Lungs: clear to auscultation bilaterally, no rales, wheezes, or rhonci  Heart: regular rate and rhythm  Abdomen: round, soft, non tender, positive bowel sounds  Musculoskeletal: No focal spinal tenderness, no peripheral edema  Neuro: non focal, well oriented, positive affect  Breasts: left breast status post lumpectomy and radiation. No evidence of recurrent disease. Left axilla benign. Right breast unremarkable.  LAB RESULTS:  CMP     Component Value Date/Time   NA 140 09/22/2015 1415   K 4.0 09/22/2015 1415   CO2 26 09/22/2015 1415   GLUCOSE 109 09/22/2015 1415   BUN 15.9 09/22/2015 1415   CREATININE 0.8 09/22/2015 1415   CALCIUM 9.2  09/22/2015 1415   PROT 7.1 09/22/2015 1415   ALBUMIN 3.9 09/22/2015 1415   AST 20 09/22/2015 1415   ALT 24 09/22/2015 1415   ALKPHOS 54 09/22/2015 1415   BILITOT 0.30 09/22/2015 1415   I No results found for: SPEP  Lab Results  Component Value Date   WBC 5.4 09/22/2015   NEUTROABS 3.5 09/22/2015   HGB 12.8 09/22/2015   HCT  37.9 09/22/2015   MCV 93.7 09/22/2015   PLT 244 09/22/2015      Chemistry      Component Value Date/Time   NA 140 09/22/2015 1415   K 4.0 09/22/2015 1415   CO2 26 09/22/2015 1415   BUN 15.9 09/22/2015 1415   CREATININE 0.8 09/22/2015 1415      Component Value Date/Time   CALCIUM 9.2 09/22/2015 1415   ALKPHOS 54 09/22/2015 1415   AST 20 09/22/2015 1415   ALT 24 09/22/2015 1415   BILITOT 0.30 09/22/2015 1415      STUDIES: US Breast Ltd Uni Left Inc Axilla  09/07/2015  CLINICAL DATA:  Medial left breast cancer in 2015. Complex sclerosing lesion resected on the right in 2016. Annual mammography. EXAM: 2D DIGITAL DIAGNOSTIC BILATERAL MAMMOGRAM WITH CAD AND ADJUNCT TOMO ULTRASOUND LEFT BREAST COMPARISON:  Previous exam(s). ACR Breast Density Category b: There are scattered areas of fibroglandular density. FINDINGS: The left lumpectomy and right surgical excision sites are stable. A few oil cysts or developing in the left breast consistent with fat necrosis. There is a region of subtle distortion in the left breast located slightly lateral and superior to the nipple at a mid depth. No other suspicious findings Mammographic images were processed with CAD. On physical exam, no suspicious lumps are identified. Targeted ultrasound is performed, showing multiple areas of ill defined shadowing with no discrete correlate to the mammographically seen distortion. IMPRESSION: Distortion in the left breast as described above. This is further from the lumpectomy site than typical to convincingly represent postsurgical scar. RECOMMENDATION: Stereotactic biopsy of the left  breast distortion. I have discussed the findings and recommendations with the patient. Results were also provided in writing at the conclusion of the visit. If applicable, a reminder letter will be sent to the patient regarding the next appointment. BI-RADS CATEGORY  4: Suspicious. Electronically Signed   By: Dorise Bullion III M.D   On: 09/07/2015 09:36   Mm Diag Breast Tomo Uni Left  09/13/2015  CLINICAL DATA:  55 year old female -evaluate clip placement following stereotactic/ tomosynthesis guided left breast biopsy. EXAM: DIAGNOSTIC LEFT MAMMOGRAM POST STEREOTACTIC BIOPSY COMPARISON:  Previous exam(s). FINDINGS: Mammographic images were obtained following stereotactic/tomosynthesis guided biopsy of distortion within the upper left breast. The coil shaped clip is in satisfactory position. IMPRESSION: Satisfactory clip position following stereotactic/tomosynthesis guided left breast biopsy. Final Assessment: Post Procedure Mammograms for Marker Placement Electronically Signed   By: Margarette Canada M.D.   On: 09/13/2015 11:13   Mm Diag Breast Tomo Bilateral  09/07/2015  CLINICAL DATA:  Medial left breast cancer in 2015. Complex sclerosing lesion resected on the right in 2016. Annual mammography. EXAM: 2D DIGITAL DIAGNOSTIC BILATERAL MAMMOGRAM WITH CAD AND ADJUNCT TOMO ULTRASOUND LEFT BREAST COMPARISON:  Previous exam(s). ACR Breast Density Category b: There are scattered areas of fibroglandular density. FINDINGS: The left lumpectomy and right surgical excision sites are stable. A few oil cysts or developing in the left breast consistent with fat necrosis. There is a region of subtle distortion in the left breast located slightly lateral and superior to the nipple at a mid depth. No other suspicious findings Mammographic images were processed with CAD. On physical exam, no suspicious lumps are identified. Targeted ultrasound is performed, showing multiple areas of ill defined shadowing with no discrete correlate  to the mammographically seen distortion. IMPRESSION: Distortion in the left breast as described above. This is further from the lumpectomy site than typical to convincingly represent postsurgical scar. RECOMMENDATION: Stereotactic  biopsy of the left breast distortion. I have discussed the findings and recommendations with the patient. Results were also provided in writing at the conclusion of the visit. If applicable, a reminder letter will be sent to the patient regarding the next appointment. BI-RADS CATEGORY  4: Suspicious. Electronically Signed   By: Dorise Bullion III M.D   On: 09/07/2015 09:36   Mm Lt Breast Bx W Loc Dev 1st Lesion Image Bx Spec Stereo Guide  09/15/2015  ADDENDUM REPORT: 09/15/2015 13:48 ADDENDUM: Pathology revealed FIBROCYSTIC CHANGES WITH CALCIFICATION, BENIGN STROMAL FIBROSIS of the upper Left breast. This was found to be discordant by Dr. Hassan Rowan. Pathology results were discussed with the patient by telephone. The patient reported doing well after the biopsy with tenderness at the site. Post biopsy instructions and care were reviewed and questions were answered. The patient was encouraged to call [facility] for any additional concerns. Surgical consultation has been arranged with Dr. Adonis Housekeeper at [facility] on Sep 23, 2015. Pathology results reported by Terie Purser, RN on 09/15/2015. Pathology results reported by Terie Purser, RN on 09/15/2015. Electronically Signed   By: Margarette Canada M.D.   On: 09/15/2015 13:48  09/15/2015  CLINICAL DATA:  55 year old female for tissue sampling of upper left breast distortion. History of inner left breast cancer and lumpectomy in 2015. EXAM: LEFT BREAST STEREOTACTIC CORE NEEDLE BIOPSY COMPARISON:  Previous exams. FINDINGS: The patient and I discussed the procedure of stereotactic-guided biopsy including benefits and alternatives. We discussed the high likelihood of a successful procedure. We discussed the  risks of the procedure including infection, bleeding, tissue injury, clip migration, and inadequate sampling. Informed written consent was given. The usual time out protocol was performed immediately prior to the procedure. Using sterile technique and 2% Lidocaine as local anesthetic, under stereotactic guidance, a 9 gauge vacuum assisted needle device was used to perform core needle biopsy of distortion within the upper left breast using a lateral approach. At the conclusion of the procedure, a coil shaped tissue marker clip was deployed into the biopsy cavity. Follow-up 2-view mammogram was performed and dictated separately. IMPRESSION: Stereotactic-guided biopsy of upper left breast distortion. No apparent complications. Pathology will be followed. Electronically Signed: By: Margarette Canada M.D. On: 09/13/2015 11:12     ASSESSMENT: 55 y.o. BRCA negative [place] woman status post left breast biopsy for 01/18/2014 showing invasive lobular carcinoma (E-cadherin negative), estrogen receptor 70% positive, progesterone receptor 70% positive, with an MIB-1 of 7%, and no HER-2 amplification  (1) ultrasound-guided biopsy of a suspicious lymph node in the left axilla 08/19/2012 was positive  (2) status post left lumpectomy and axillary lymph node dissection 09/09/2013 for a pT1c pN2, stage IIIa invasive lobular carcinoma, grade 1, repeat HER-2 again negative. [She had 8 of 23 axillary lymph nodes involved]  (4) Adjuvant chemotherapy consisting of doxorubicin and cyclophosphamide in dose dense fashion x4 completed 12/18/2013;followed by paclitaxel weekly x 12 completed 03/20/2014  (5) radiation completed in [facility] 07/01/2014  (6) started tamoxifen 08/01/2014  (a) the patient is status post simple hysterectomy, no salpingo-oophorectomy  (7) hot flashes - gabapentin '300mg'$  nightly, to begin venlafaxine daily.   PLAN: I explained to [name] [name] that she needs to contact Dr. Excell Seltzer regarding follow up of the  breast biopsy she has last week. The pathology revealed fibrocystic changes with calcifications but this is discordant with Dr. Bo Mcclintock assessment of the radiologic images. It looks like Dr. Excell Seltzer may be interested in further testing.  She is agreeable to this.   Otherwise she is doing well. The labs were reviewed in detail and were entirely stable. She will continue tamoxifen daily.   [place] is scheduled for follow up with Dr. Jana Hakim in 6 months. Of course she will be brought in sooner if necessary. She understands and agrees with this plan. She knows the goal of treatment in her case is cure. She has been encouraged to call with any issues that might arise before her next visit here.    Laurie Panda, NP 09/22/2015

## 2015-10-20 ENCOUNTER — Telehealth: Payer: Self-pay | Admitting: *Deleted

## 2015-10-20 DIAGNOSIS — C50212 Malignant neoplasm of upper-inner quadrant of left female breast: Secondary | ICD-10-CM

## 2015-10-20 MED ORDER — VENLAFAXINE HCL ER 37.5 MG PO CP24: 37.5000 mg | ORAL_CAPSULE | Freq: Every day | ORAL | Status: DC

## 2015-11-11 ENCOUNTER — Other Ambulatory Visit: Payer: Self-pay | Admitting: General Surgery

## 2015-11-11 DIAGNOSIS — R928 Other abnormal and inconclusive findings on diagnostic imaging of breast: Secondary | ICD-10-CM

## 2015-11-12 ENCOUNTER — Ambulatory Visit (INDEPENDENT_AMBULATORY_CARE_PROVIDER_SITE_OTHER): Payer: BLUE CROSS/BLUE SHIELD | Admitting: Family Medicine

## 2015-11-12 ENCOUNTER — Encounter: Payer: Self-pay | Admitting: Family Medicine

## 2015-11-12 VITALS — BP 118/78 | HR 78 | Temp 97.9°F | Resp 20 | Ht 66.0 in | Wt 156.8 lb

## 2015-11-12 DIAGNOSIS — Z114 Encounter for screening for human immunodeficiency virus [HIV]: Secondary | ICD-10-CM | POA: Diagnosis not present

## 2015-11-12 DIAGNOSIS — Z1321 Encounter for screening for nutritional disorder: Secondary | ICD-10-CM

## 2015-11-12 DIAGNOSIS — Z23 Encounter for immunization: Secondary | ICD-10-CM | POA: Diagnosis not present

## 2015-11-12 DIAGNOSIS — R740 Nonspecific elevation of levels of transaminase and lactic acid dehydrogenase [LDH]: Secondary | ICD-10-CM | POA: Diagnosis not present

## 2015-11-12 DIAGNOSIS — Z1159 Encounter for screening for other viral diseases: Secondary | ICD-10-CM | POA: Diagnosis not present

## 2015-11-12 DIAGNOSIS — C50212 Malignant neoplasm of upper-inner quadrant of left female breast: Secondary | ICD-10-CM

## 2015-11-12 DIAGNOSIS — Z0000 Encounter for general adult medical examination without abnormal findings: Secondary | ICD-10-CM | POA: Insufficient documentation

## 2015-11-12 DIAGNOSIS — R7401 Elevation of levels of liver transaminase levels: Secondary | ICD-10-CM

## 2015-11-12 LAB — COMPREHENSIVE METABOLIC PANEL
ALT: 27 U/L (ref 0–35)
AST: 21 U/L (ref 0–37)
Albumin: 3.9 g/dL (ref 3.5–5.2)
Alkaline Phosphatase: 44 U/L (ref 39–117)
BUN: 17 mg/dL (ref 6–23)
CHLORIDE: 104 meq/L (ref 96–112)
CO2: 26 mEq/L (ref 19–32)
CREATININE: 0.87 mg/dL (ref 0.40–1.20)
Calcium: 8.7 mg/dL (ref 8.4–10.5)
GFR: 71.38 mL/min (ref >60.00)
Glucose, Bld: 79 mg/dL (ref 70–99)
POTASSIUM: 4.4 meq/L (ref 3.5–5.1)
SODIUM: 140 meq/L (ref 135–145)
Total Bilirubin: 0.4 mg/dL (ref 0.2–1.2)
Total Protein: 6.4 g/dL (ref 6.0–8.3)

## 2015-11-12 LAB — VITAMIN D 25 HYDROXY (VIT D DEFICIENCY, FRACTURES): VITD: 34.62 ng/mL (ref 30.00–100.00)

## 2015-11-12 NOTE — Progress Notes (Signed)
Patient ID: [name] [name], female   DOB: 05-Oct-1960, 55 y.o.   MRN: [id number]     Patient ID: [name] [name], female  DOB: [birth year], 55 y.o.   MRN: [id number] Patient Care Team    Relationship Specialty Notifications Start End  Ma Hillock, DO PCP - General Family Medicine  07/16/15   Excell Seltzer, MD Consulting Physician General Surgery  08/10/15   Chauncey Cruel, MD Consulting Physician Oncology  08/10/15   [name], MD Consulting Physician Obstetrics and Gynecology  08/10/15     Subjective:  [name] [name] is a 55 y.o.  Female  present for CPE without PAP. All past medical history, surgical history, allergies, family history, immunizations, medications and social history were updated in the electronic medical record today. All recent labs, ED visits and hospitalizations within the last year were reviewed.  Health maintenance:  Colonoscopy: Dr. Lindalou Hose (retired) [facility]. 05/2007; 10 year f/u.  Mammogram: 09/02/2014; yearly. Left Breast cancer 2015. Tamoxifen use (started 03/2014).  Cervical cancer screening:Dr. Julien Girt, [facility], 10/2014. UTD/hysterectomy for fibroids appt today.  Immunizations: Declines Flu, PNA consider with chemo/breast cancer, Tdap completed today. Infectious disease screening: HIV and Hep C completed today DEXA: completed 06/30/2014. Osteopenia. Taking Vit D and Ca use. Estrogen deficiency. Current Tamoxifen use.  Assistive device: None Oxygen FWY:OVZC Patient has a Dental home with routine visits.  Hospitalizations/ED visits: None Has ophthalmologist in [place] (she does not recall her name).  05/2015 labs from outside source: Lipid panel: Ch 160, HDL 34, trig 95, ldl 101 Cbc: WNL with the exception of mildly low lymphocytes CMP: Glucose 88, cr 0.75, gfr 89, sodium 141, potassium 4.0, chloride 104, C022, albumin 4.0,bili 0.4, alk phos 54, ast 27, alt 35 Mag WNL TSH: 2.68 (WNL)  Vit d: 31 Depression screen [facility] 2/9 11/12/2015 08/10/2015  03/23/2014  Decreased Interest 0 0 0  Down, Depressed, Hopeless 0 0 0  PHQ - 2 Score 0 0 0   Fall Risk  11/12/2015 08/10/2015 05/15/2014 03/23/2014  Falls in the past year? No No No No     There is no immunization history on file for this patient.   Past Medical History  Diagnosis Date  . Palpitations 2009    Improved; Secondary to premature ventricular contractions. No problems since-pt states was caring for ailing father, anxiety  . Anxiety disorder     Mild: treated by her gynecologist with selective serotonin reuptake inhibitor  . Nonspecific abnormal electrocardiogram (ECG) (EKG) 2009  . GERD (gastroesophageal reflux disease)     rolaids  . Hot flashes   . Family history of malignant neoplasm of breast   . Arthritis     hands and feet  . Wears glasses   . Malignant neoplasm of breast (female), unspecified site 08/07/13    left breast invasive mammary ca in situ  . Anxiety   . Hyperlipidemia   . Fibroid uterus     hysterctomy  . Osteoarthritis   . Tick bite of right thigh 2016   No Known Allergies Past Surgical History  Procedure Laterality Date  . Thyroidectomy, partial    . Cesarean section    . Endometrial ablation  2006  . Dilation and curettage of uterus    . Laparoscopic assisted vaginal hysterectomy  02/07/2011    Procedure: LAPAROSCOPIC ASSISTED VAGINAL HYSTERECTOMY;  Surgeon: [name];  Location: [facility];  Service: Gynecology;  Laterality: N/A;  . Abdominal hysterectomy  02/07/2011    Procedure: HYSTERECTOMY ABDOMINAL;  Surgeon: [name];  Location: [facility];  Service: Gynecology;  Laterality: N/A;  . Breast lumpectomy with needle localization and axillary lymph node dissection Left 09/09/2013    Procedure: LEFT BREAST LUMPECTOMY WITH NEEDLE LOCALIZATION AND LEFT AXILLARY LYMPH NODE DISSECTION;  Surgeon: [name], MD;  Location: [facility];  Service: General;  Laterality: Left;  . Portacath placement Right 09/29/2013     Procedure: INSERTION PORT-A-CATH;  Surgeon: [name], MD;  Location: [facility];  Service: General;  Laterality: Right;  . Breast lumpectomy with radioactive seed localization Right 10/07/2014    Procedure: RIGHT BREAST LUMPECTOMY WITH RADIOACTIVE SEED LOCALIZATION;  Surgeon: Excell Seltzer, MD;  Location: [facility];  Service: General;  Laterality: Right;   Family History  Problem Relation Age of Onset  . Angina Father   . Aortic aneurysm Father   . Dementia Father   . CAD Father   . Arrhythmia Maternal Aunt     A fib  . Breast cancer Maternal Aunt 56    currently 11  . Thyroid disease Maternal Aunt   . Hypertension Other   . Arrhythmia Other     Uncle- a fib  . Arrhythmia Maternal Aunt     A fib  . Breast cancer Maternal Aunt     dx 73s; currently 73s  . Breast cancer Mother     dx 37s. Died at 46  . Non-Hodgkin's lymphoma Mother   . Colon cancer Maternal Grandmother     dx 102s; died at 46  . Thyroid disease Maternal Grandmother   . Colon cancer Maternal Grandfather     dx 38s; died in 35s  . Cancer Paternal Uncle     unknown primary in early 7s  . Breast cancer Maternal Aunt     dx 35s; died in 26s  . Thyroid disease Maternal Uncle    Social History   Social History  . Marital Status: Married    Spouse Name: N/A  . Number of Children: N/A  . Years of Education: N/A   Occupational History  . Register of deeds     Full time   Social History Main Topics  . Smoking status: Never Smoker   . Smokeless tobacco: Never Used  . Alcohol Use: 2.4 - 3.0 oz/week    4-5 Cans of beer per week  . 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 Sexual Activity: Yes    Birth Control/ Protection: Surgical   Other Topics Concern  . Not on file   Social History Narrative   Married, [name]. 2 children [name] and [place])   HS graduation; [employer] employee (desk job)   Nonsmoker, 3-5 drinks weekly of etoh,   Denies drugs.    Drinks caffeine beverages   Wears seatbelt, smoke detector in the home   Firearms in the home   Exercises >3 x week.    Feels safe in relationships.      Medication List       This list is accurate as of: 11/12/15  9:10 AM.  Always use your most recent med list.               aspirin EC 81 MG tablet  Take 1 tablet (81 mg total) by mouth daily.     calcium carbonate 1500 (600 Ca) MG Tabs tablet  Commonly known as:  OSCAL  Take by mouth 2 (two) times daily with a meal.  diclofenac 75 MG EC tablet  Commonly known as:  VOLTAREN  Take 75 mg by mouth 2 (two) times daily as needed.     gabapentin 300 MG capsule  Commonly known as:  NEURONTIN  Take 1 capsule (300 mg total) by mouth at bedtime.     tamoxifen 20 MG tablet  Commonly known as:  NOLVADEX  Take 1 tablet (20 mg total) by mouth daily.     venlafaxine XR 37.5 MG 24 hr capsule  Commonly known as:  EFFEXOR-XR  Take 1 capsule (37.5 mg total) by mouth daily with breakfast.     Vitamin D 2000 units tablet  Take 2,000 Units by mouth daily.         Recent Results (from the past 2160 hour(s))  CBC with Differential     Status: None   Collection Time: 09/22/15  2:15 PM  Result Value Ref Range   WBC 5.4 3.9 - 10.3 10e3/uL   NEUT# 3.5 1.5 - 6.5 10e3/uL   HGB 12.8 11.6 - 15.9 g/dL   HCT 37.9 34.8 - 46.6 %   Platelets 244 145 - 400 10e3/uL   MCV 93.7 79.5 - 101.0 fL   MCH 31.8 25.1 - 34.0 pg   MCHC 33.9 31.5 - 36.0 g/dL   RBC 4.04 3.70 - 5.45 10e6/uL   RDW 12.4 11.2 - 14.5 %   lymph# 1.3 0.9 - 3.3 10e3/uL   MONO# 0.4 0.1 - 0.9 10e3/uL   Eosinophils Absolute 0.1 0.0 - 0.5 10e3/uL   Basophils Absolute 0.0 0.0 - 0.1 10e3/uL   NEUT% 64.9 38.4 - 76.8 %   LYMPH% 24.3 14.0 - 49.7 %   MONO% 7.7 0.0 - 14.0 %   EOS% 2.6 0.0 - 7.0 %   BASO% 0.5 0.0 - 2.0 %  Comprehensive metabolic panel     Status: Abnormal   Collection Time: 09/22/15  2:15 PM  Result Value Ref Range   Sodium 140 136 - 145 mEq/L    Potassium 4.0 3.5 - 5.1 mEq/L   Chloride 105 98 - 109 mEq/L   CO2 26 22 - 29 mEq/L   Glucose 109 70 - 140 mg/dl    Comment: Glucose reference range is for nonfasting patients. Fasting glucose reference range is 70- 100.   BUN 15.9 7.0 - 26.0 mg/dL   Creatinine 0.8 0.6 - 1.1 mg/dL   Total Bilirubin 0.30 0.20 - 1.20 mg/dL   Alkaline Phosphatase 54 40 - 150 U/L   AST 20 5 - 34 U/L   ALT 24 0 - 55 U/L   Total Protein 7.1 6.4 - 8.3 g/dL   Albumin 3.9 3.5 - 5.0 g/dL   Calcium 9.2 8.4 - 10.4 mg/dL   Anion Gap 9 3 - 11 mEq/L   EGFR 78 (L) >90 ml/min/1.73 m2    Comment: eGFR is calculated using the CKD-EPI Creatinine Equation (2009)    Mm Diag Breast Tomo Uni Left  09/13/2015  CLINICAL DATA:  55 year old female -evaluate clip placement following stereotactic/ tomosynthesis guided left breast biopsy. EXAM: DIAGNOSTIC LEFT MAMMOGRAM POST STEREOTACTIC BIOPSY COMPARISON:  Previous exam(s). FINDINGS: Mammographic images were obtained following stereotactic/tomosynthesis guided biopsy of distortion within the upper left breast. The coil shaped clip is in satisfactory position. IMPRESSION: Satisfactory clip position following stereotactic/tomosynthesis guided left breast biopsy. Final Assessment: Post Procedure Mammograms for Marker Placement Electronically Signed   By: Margarette Canada M.D.   On: 09/13/2015 11:13   Mm Lt Breast Bx W Loc Dev 1st  Lesion Image Bx Spec Stereo Guide  09/15/2015  ADDENDUM REPORT: 09/15/2015 13:48 ADDENDUM: Pathology revealed FIBROCYSTIC CHANGES WITH CALCIFICATION, BENIGN STROMAL FIBROSIS of the upper Left breast. This was found to be discordant by Dr. Hassan Rowan. Pathology results were discussed with the patient by telephone. The patient reported doing well after the biopsy with tenderness at the site. Post biopsy instructions and care were reviewed and questions were answered. The patient was encouraged to call [facility] for any additional concerns. Surgical  consultation has been arranged with Dr. Adonis Housekeeper at [facility] on Sep 23, 2015. Pathology results reported by Terie Purser, RN on 09/15/2015. Pathology results reported by Terie Purser, RN on 09/15/2015. Electronically Signed   By: Margarette Canada M.D.   On: 09/15/2015 13:48  09/15/2015  CLINICAL DATA:  55 year old female for tissue sampling of upper left breast distortion. History of inner left breast cancer and lumpectomy in 2015. EXAM: LEFT BREAST STEREOTACTIC CORE NEEDLE BIOPSY COMPARISON:  Previous exams. FINDINGS: The patient and I discussed the procedure of stereotactic-guided biopsy including benefits and alternatives. We discussed the high likelihood of a successful procedure. We discussed the risks of the procedure including infection, bleeding, tissue injury, clip migration, and inadequate sampling. Informed written consent was given. The usual time out protocol was performed immediately prior to the procedure. Using sterile technique and 2% Lidocaine as local anesthetic, under stereotactic guidance, a 9 gauge vacuum assisted needle device was used to perform core needle biopsy of distortion within the upper left breast using a lateral approach. At the conclusion of the procedure, a coil shaped tissue marker clip was deployed into the biopsy cavity. Follow-up 2-view mammogram was performed and dictated separately. IMPRESSION: Stereotactic-guided biopsy of upper left breast distortion. No apparent complications. Pathology will be followed. Electronically Signed: By: Margarette Canada M.D. On: 09/13/2015 11:12   ROS: 14 pt review of systems performed and negative (unless mentioned in an HPI)  Objective: BP 118/78 mmHg  Pulse 78  Temp(Src) 97.9 F (36.6 C)  Resp 20  Ht '5\' 6"'$  (1.676 m)  Wt 156 lb 12.8 oz (71.124 kg)  BMI 25.32 kg/m2  SpO2 96% Gen: Afebrile. No acute distress. Nontoxic in appearance, well-developed, well-nourished,  Pleasant caucasian female.  HENT: AT. [place]. Bilateral TM  visualized and normal in appearance, normal external auditory canal. MMM, no oral lesions, adequate dentition. Bilateral nares within normal limits. Throat without erythema, ulcerations or exudates. No Cough on exam, no hoarseness on exam. Eyes:Pupils Equal Round Reactive to light, Extraocular movements intact,  Conjunctiva without redness, discharge or icterus. Neck/lymp/endocrine: Supple,no lymphadenopathy, right  thyromegaly CV: RRR no murmur, no edema, +2/4 P posterior tibialis pulses. no carotid bruits Chest: CTAB, no wheeze, rhonchi or crackles. normal Respiratory effort. good Air movement. Abd: Soft. flat. NTND. BS present. no Masses palpated. No hepatosplenomegaly. No rebound tenderness or guarding. Skin: no rashes, purpura or petechiae. Warm and well-perfused. Skin intact. Neuro/Msk:  Normal gait. PERLA. EOMi. Alert. Oriented x3.  Cranial nerves II through XII intact. Muscle strength 5/5 upper/lower extremity. DTRs equal bilaterally. Psych: Normal affect, dress and demeanor. Normal speech. Normal thought content and judgment.  Assessment/plan: [name] [name] is a 55 y.o. female present for CPE without PAP Encounter for vitamin deficiency screening - Vitamin D (25 hydroxy) - pt has started to take vitamin D daily.   Need for hepatitis C screening test/Screening for HIV (human immunodeficiency virus) - pt agreeable to testing today - Hepatitis C Antibody - HIV  antibody (with reflex)  Elevated ALT measurement - Comp Met (CMET)  Breast cancer of upper-inner quadrant of left female breast ([place]) - pt following with oncology and surgery routinely. Left breast mammogram was concerning May and they are scheduling a lumpectomy of the area. Pt is on tamoxifen.   Encounter for preventive health examination -  tdap administered today - consider PNA series with breast cancer history/chemo etc.  - yearly flu shot encouraged - routine dental and eye exams encouraged -Patient was encouraged to  exercise greater than 150 minutes a week. Patient was encouraged to choose a diet filled with fresh fruits and vegetables, and lean meats. AVS provided to patient today for education/recommendation on gender specific health and safety maintenance.  Return in about 1 year (around 11/11/2016) for CPE.  Electronically signed by: Howard Pouch, DO [place]

## 2015-11-12 NOTE — Patient Instructions (Signed)
Please ask oncologist about their recommendations on Pneumonia series. If they feel you would benefit, you can make a nurse appt to receive 1st immunization, with second to occur 3-6 months after.  Attempt to complete advanced directives.   Health Maintenance, Female Adopting a healthy lifestyle and getting preventive care can go a long way to promote health and wellness. Talk with your health care provider about what schedule of regular examinations is right for you. This is a good chance for you to check in with your provider about disease prevention and staying healthy. In between checkups, there are plenty of things you can do on your own. Experts have done a lot of research about which lifestyle changes and preventive measures are most likely to keep you healthy. Ask your health care provider for more information. WEIGHT AND DIET  Eat a healthy diet  Be sure to include plenty of vegetables, fruits, low-fat dairy products, and lean protein.  Do not eat a lot of foods high in solid fats, added sugars, or salt.  Get regular exercise. This is one of the most important things you can do for your health.  Most adults should exercise for at least 150 minutes each week. The exercise should increase your heart rate and make you sweat (moderate-intensity exercise).  Most adults should also do strengthening exercises at least twice a week. This is in addition to the moderate-intensity exercise.  Maintain a healthy weight  Body mass index (BMI) is a measurement that can be used to identify possible weight problems. It estimates body fat based on height and weight. Your health care provider can help determine your BMI and help you achieve or maintain a healthy weight.  For females 70 years of age and older:   A BMI below 18.5 is considered underweight.  A BMI of 18.5 to 24.9 is normal.  A BMI of 25 to 29.9 is considered overweight.  A BMI of 30 and above is considered obese.  Watch levels  of cholesterol and blood lipids  You should start having your blood tested for lipids and cholesterol at 57 years of age, then have this test every 5 years.  You may need to have your cholesterol levels checked more often if:  Your lipid or cholesterol levels are high.  You are older than 57 years of age.  You are at high risk for heart disease.  CANCER SCREENING   Lung Cancer  Lung cancer screening is recommended for adults 102-69 years old who are at high risk for lung cancer because of a history of smoking.  A yearly low-dose CT scan of the lungs is recommended for people who:  Currently smoke.  Have quit within the past 15 years.  Have at least a 30-pack-year history of smoking. A pack year is smoking an average of one pack of cigarettes a day for 1 year.  Yearly screening should continue until it has been 15 years since you quit.  Yearly screening should stop if you develop a health problem that would prevent you from having lung cancer treatment.  Breast Cancer  Practice breast self-awareness. This means understanding how your breasts normally appear and feel.  It also means doing regular breast self-exams. Let your health care provider know about any changes, no matter how small.  If you are in your 20s or 30s, you should have a clinical breast exam (CBE) by a health care provider every 1-3 years as part of a regular health exam.  If you  are 22 or older, have a CBE every year. Also consider having a breast X-ray (mammogram) every year.  If you have a family history of breast cancer, talk to your health care provider about genetic screening.  If you are at high risk for breast cancer, talk to your health care provider about having an MRI and a mammogram every year.  Breast cancer gene (BRCA) assessment is recommended for women who have family members with BRCA-related cancers. BRCA-related cancers include:  Breast.  Ovarian.  Tubal.  Peritoneal  cancers.  Results of the assessment will determine the need for genetic counseling and BRCA1 and BRCA2 testing. Cervical Cancer Your health care provider may recommend that you be screened regularly for cancer of the pelvic organs (ovaries, uterus, and vagina). This screening involves a pelvic examination, including checking for microscopic changes to the surface of your cervix (Pap test). You may be encouraged to have this screening done every 3 years, beginning at age 71.  For women ages 75-65, health care providers may recommend pelvic exams and Pap testing every 3 years, or they may recommend the Pap and pelvic exam, combined with testing for human papilloma virus (HPV), every 5 years. Some types of HPV increase your risk of cervical cancer. Testing for HPV may also be done on women of any age with unclear Pap test results.  Other health care providers may not recommend any screening for nonpregnant women who are considered low risk for pelvic cancer and who do not have symptoms. Ask your health care provider if a screening pelvic exam is right for you.  If you have had past treatment for cervical cancer or a condition that could lead to cancer, you need Pap tests and screening for cancer for at least 20 years after your treatment. If Pap tests have been discontinued, your risk factors (such as having a new sexual partner) need to be reassessed to determine if screening should resume. Some women have medical problems that increase the chance of getting cervical cancer. In these cases, your health care provider may recommend more frequent screening and Pap tests. Colorectal Cancer  This type of cancer can be detected and often prevented.  Routine colorectal cancer screening usually begins at 57 years of age and continues through 57 years of age.  Your health care provider may recommend screening at an earlier age if you have risk factors for colon cancer.  Your health care provider may also  recommend using home test kits to check for hidden blood in the stool.  A small camera at the end of a tube can be used to examine your colon directly (sigmoidoscopy or colonoscopy). This is done to check for the earliest forms of colorectal cancer.  Routine screening usually begins at age 35.  Direct examination of the colon should be repeated every 5-10 years through 57 years of age. However, you may need to be screened more often if early forms of precancerous polyps or small growths are found. Skin Cancer  Check your skin from head to toe regularly.  Tell your health care provider about any new moles or changes in moles, especially if there is a change in a mole's shape or color.  Also tell your health care provider if you have a mole that is larger than the size of a pencil eraser.  Always use sunscreen. Apply sunscreen liberally and repeatedly throughout the day.  Protect yourself by wearing long sleeves, pants, a wide-brimmed hat, and sunglasses whenever you are outside.  HEART DISEASE, DIABETES, AND HIGH BLOOD PRESSURE   High blood pressure causes heart disease and increases the risk of stroke. High blood pressure is more likely to develop in:  People who have blood pressure in the high end of the normal range (130-139/85-89 mm Hg).  People who are overweight or obese.  People who are African American.  If you are 80-64 years of age, have your blood pressure checked every 3-5 years. If you are 4 years of age or older, have your blood pressure checked every year. You should have your blood pressure measured twice--once when you are at a hospital or clinic, and once when you are not at a hospital or clinic. Record the average of the two measurements. To check your blood pressure when you are not at a hospital or clinic, you can use:  An automated blood pressure machine at a pharmacy.  A home blood pressure monitor.  If you are between 16 years and 2 years old, ask your health  care provider if you should take aspirin to prevent strokes.  Have regular diabetes screenings. This involves taking a blood sample to check your fasting blood sugar level.  If you are at a normal weight and have a low risk for diabetes, have this test once every three years after 57 years of age.  If you are overweight and have a high risk for diabetes, consider being tested at a younger age or more often. PREVENTING INFECTION  Hepatitis B  If you have a higher risk for hepatitis B, you should be screened for this virus. You are considered at high risk for hepatitis B if:  You were born in a country where hepatitis B is common. Ask your health care provider which countries are considered high risk.  Your parents were born in a high-risk country, and you have not been immunized against hepatitis B (hepatitis B vaccine).  You have HIV or AIDS.  You use needles to inject street drugs.  You live with someone who has hepatitis B.  You have had sex with someone who has hepatitis B.  You get hemodialysis treatment.  You take certain medicines for conditions, including cancer, organ transplantation, and autoimmune conditions. Hepatitis C  Blood testing is recommended for:  Everyone born from [birth year] through 1967.  Anyone with known risk factors for hepatitis C. Sexually transmitted infections (STIs)  You should be screened for sexually transmitted infections (STIs) including gonorrhea and chlamydia if:  You are sexually active and are younger than 57 years of age.  You are older than 57 years of age and your health care provider tells you that you are at risk for this type of infection.  Your sexual activity has changed since you were last screened and you are at an increased risk for chlamydia or gonorrhea. Ask your health care provider if you are at risk.  If you do not have HIV, but are at risk, it may be recommended that you take a prescription medicine daily to prevent HIV  infection. This is called pre-exposure prophylaxis (PrEP). You are considered at risk if:  You are sexually active and do not regularly use condoms or know the HIV status of your partner(s).  You take drugs by injection.  You are sexually active with a partner who has HIV. Talk with your health care provider about whether you are at high risk of being infected with HIV. If you choose to begin PrEP, you should first be tested for HIV. You  should then be tested every 3 months for as long as you are taking PrEP.  PREGNANCY   If you are premenopausal and you may become pregnant, ask your health care provider about preconception counseling.  If you may become pregnant, take 400 to 800 micrograms (mcg) of folic acid every day.  If you want to prevent pregnancy, talk to your health care provider about birth control (contraception). OSTEOPOROSIS AND MENOPAUSE   Osteoporosis is a disease in which the bones lose minerals and strength with aging. This can result in serious bone fractures. Your risk for osteoporosis can be identified using a bone density scan.  If you are 3 years of age or older, or if you are at risk for osteoporosis and fractures, ask your health care provider if you should be screened.  Ask your health care provider whether you should take a calcium or vitamin D supplement to lower your risk for osteoporosis.  Menopause may have certain physical symptoms and risks.  Hormone replacement therapy may reduce some of these symptoms and risks. Talk to your health care provider about whether hormone replacement therapy is right for you.  HOME CARE INSTRUCTIONS   Schedule regular health, dental, and eye exams.  Stay current with your immunizations.   Do not use any tobacco products including cigarettes, chewing tobacco, or electronic cigarettes.  If you are pregnant, do not drink alcohol.  If you are breastfeeding, limit how much and how often you drink alcohol.  Limit  alcohol intake to no more than 1 drink per day for nonpregnant women. One drink equals 12 ounces of beer, 5 ounces of wine, or 1 ounces of hard liquor.  Do not use street drugs.  Do not share needles.  Ask your health care provider for help if you need support or information about quitting drugs.  Tell your health care provider if you often feel depressed.  Tell your health care provider if you have ever been abused or do not feel safe at home.   This information is not intended to replace advice given to you by your health care provider. Make sure you discuss any questions you have with your health care provider.   Document Released: 10/31/2010 Document Revised: 05/08/2014 Document Reviewed: 03/19/2013 Elsevier Interactive Patient Education [employer].

## 2015-11-12 NOTE — Addendum Note (Signed)
Addended by: Leota Jacobsen on: 11/12/2015 09:35 AM   Modules accepted: Orders, SmartSet

## 2015-11-13 LAB — HEPATITIS C ANTIBODY: HCV AB: NEGATIVE

## 2015-11-13 LAB — HIV ANTIBODY (ROUTINE TESTING W REFLEX): HIV 1&2 Ab, 4th Generation: NONREACTIVE

## 2015-11-15 ENCOUNTER — Telehealth: Payer: Self-pay | Admitting: Family Medicine

## 2015-11-15 NOTE — Telephone Encounter (Signed)
Please call pt: - all labs are normal. 

## 2015-11-15 NOTE — Telephone Encounter (Signed)
Spoke with patient reviewed lab results. 

## 2015-11-24 ENCOUNTER — Other Ambulatory Visit: Payer: Self-pay | Admitting: General Surgery

## 2015-11-24 DIAGNOSIS — R928 Other abnormal and inconclusive findings on diagnostic imaging of breast: Secondary | ICD-10-CM

## 2015-11-25 ENCOUNTER — Other Ambulatory Visit: Payer: Self-pay | Admitting: General Surgery

## 2015-11-25 DIAGNOSIS — R928 Other abnormal and inconclusive findings on diagnostic imaging of breast: Secondary | ICD-10-CM

## 2015-12-20 ENCOUNTER — Encounter (HOSPITAL_BASED_OUTPATIENT_CLINIC_OR_DEPARTMENT_OTHER): Payer: Self-pay | Admitting: *Deleted

## 2015-12-20 NOTE — Progress Notes (Signed)
Bring all medications. Coming Wednesday for Boost drink and instructions.

## 2015-12-23 ENCOUNTER — Ambulatory Visit
Admission: RE | Admit: 2015-12-23 | Discharge: 2015-12-23 | Disposition: A | Discharge status: Home / Self Care | Payer: BLUE CROSS/BLUE SHIELD | Source: Ambulatory Visit | Attending: General Surgery | Admitting: General Surgery

## 2015-12-23 DIAGNOSIS — R928 Other abnormal and inconclusive findings on diagnostic imaging of breast: Secondary | ICD-10-CM

## 2015-12-23 NOTE — Progress Notes (Signed)
Pt given carton of boost breeze with written and verbal instruction to drink by 410 am morning of surgery, teach back, pt voiced understanding

## 2015-12-23 NOTE — H&P (Signed)
History of Present Illness Marland Kitchen T. Maigen Mozingo MD;  Patient words: discordant bx, left breast.  The patient is a 55 year old female who presents with a complaint of Breast problems. She returns to the office for follow-up of her breast cancer and her review a recent abnormal mammogram and possible discordant biopsy.  She is status post left lumpectomy and axillary lymph node dissection 09/09/2013 for a pT1c pN2, stage IIIa invasive lobular carcinoma, grade 1, repeat HER-2 again negative. Patient had 8/23 LN positive and also evidence of LVI (lymphovascular invasion) and PNI (perineural invasion).  She completed chemotherapy and radiation treatments. Remains on tamoxifen and tolerating this well.  Recent routine screening mammogram May of this year revealed a possible area of distortion in the lateral left breast away from the lumpectomy site. There were some associated calcifications. There was no ultrasound correlate. Stereotactic biopsy was recommended and performed which revealed fibrocystic disease with calcifications and fibrosis but no evidence of malignancy. Mammogram was felt to be BI-RADS 4 and there was concern about discordant biopsy.  She has not noted any changes in her breast. Feeling well. No lumps or skin changes or nipple discharge. No arm swelling.     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 Cesarean Section - 1 Hysterectomy (not due to cancer) - Partial Breast Biopsy Bilateral. Thyroid Surgery  Diagnostic Studies History [name], MD;  Colonoscopy 5-10 years ago Mammogram within last year Pap Smear 1-5 years ago  Allergies  No Known  Drug Allergies05/26/2016  Medication History Gabapentin (300MG Capsule, Oral) Active. Aspirin (81MG Tablet, Oral) Active. Venlafaxine HCl (37.5MG Tablet, Oral) Active. Diclofenac (75MG Tablet, Oral) Active. Tamoxifen Citrate (20MG Tablet, Oral) Active. Medications Reconciled Doxycycline Hyclate (100MG Capsule, Oral) Active. Vitamin D (Cholecalciferol) (1000UNIT Tablet, Oral) Active. Naproxen Sodium (220MG Capsule, Oral) Active.  Social History [name], MD; 11/11/2015 11:01 AM) Caffeine use Carbonated beverages, Coffee, Tea. Alcohol use Moderate alcohol use. Tobacco use Never smoker. No drug use  Family History [name], MD; 11/11/2015 11:01 AM) Heart Disease Father. Hypertension Mother. Migraine Headache Daughter. Cancer Mother. Anesthetic complications Father. Arthritis Mother. Breast Cancer Mother.  Pregnancy / Birth History [name], MD; 11/11/2015 11:01 AM) 1 2 Maternal age 62-25 Age of menopause 59-55 Age at menarche 38 years. Gravida 2 Contraceptive History Contraceptive implant, Oral contraceptives.  Vitals  Weight: 155.13 lb Temp.: 31F(Oral)  Pulse: 73 (Regular)  BP: 120/78 (Sitting, Left Arm, Standard)       Physical Exam  The physical exam findings are as follows: Note:General: Alert, well-developed and well nourished Caucasian female, in no distress Skin: Warm and dry without rash or infection. HEENT: No palpable masses or thyromegaly. Sclera nonicteric. Lymph nodes: No cervical, supraclavicular, nodes palpable. Breasts: Well-healed lumpectomy medial left breast. No palpable masses in either breast. No palpable axillary adenopathy. Lungs: Breath sounds clear and equal. No wheezing or increased work of breathing. Cardiovascular: Regular rate and rhythm without murmer. No JVD or edema. Peripheral pulses intact. No carotid bruits. Extremities: No edema or joint swelling or  deformity. No chronic venous stasis changes. Neurologic: Alert and fully oriented. Gait normal. No focal weakness. Psychiatric: Normal mood and affect. Thought content appropriate with normal judgement and insight    Assessment &  Plan  ABNORMAL MAMMOGRAM OF LEFT BREAST (R92.8) Impression: Patient with history of stage III cancer of the left breast and now separate area in the left breast with abnormal density and possible discordant biopsy showing fibrocystic disease. I discussed the findings in detail with the patient and her husband. Particularly with her personal history of breast cancer I think excision to rule out malignancy would be indicated. I discussed the option of close imaging follow-up. After our discussion they would like to proceed with left breast lumpectomy. We discussed the nature and indications of the procedure, expected recovery, and risks of anesthetic complications, bleeding or infection. All questions were answered. Current Plans Pt Education - [facility] Breast Biopsy HCI: discussed with patient and provided information. Radioactive seed localized left breast lumpectomy under general anesthesia as an outpatient

## 2015-12-24 ENCOUNTER — Encounter (HOSPITAL_BASED_OUTPATIENT_CLINIC_OR_DEPARTMENT_OTHER)
Admission: RE | Disposition: A | Discharge status: Home / Self Care | Payer: Self-pay | Source: Ambulatory Visit | Attending: General Surgery

## 2015-12-24 ENCOUNTER — Ambulatory Visit (HOSPITAL_BASED_OUTPATIENT_CLINIC_OR_DEPARTMENT_OTHER)
Admission: RE | Admit: 2015-12-24 | Discharge: 2015-12-24 | Disposition: A | Discharge status: Home / Self Care | Payer: BLUE CROSS/BLUE SHIELD | Source: Ambulatory Visit | Attending: General Surgery | Admitting: General Surgery

## 2015-12-24 ENCOUNTER — Ambulatory Visit
Admission: RE | Admit: 2015-12-24 | Discharge: 2015-12-24 | Disposition: A | Discharge status: Home / Self Care | Payer: BLUE CROSS/BLUE SHIELD | Source: Ambulatory Visit | Attending: General Surgery | Admitting: General Surgery

## 2015-12-24 ENCOUNTER — Ambulatory Visit (HOSPITAL_BASED_OUTPATIENT_CLINIC_OR_DEPARTMENT_OTHER): Payer: BLUE CROSS/BLUE SHIELD | Admitting: Anesthesiology

## 2015-12-24 ENCOUNTER — Encounter (HOSPITAL_BASED_OUTPATIENT_CLINIC_OR_DEPARTMENT_OTHER): Payer: Self-pay | Admitting: *Deleted

## 2015-12-24 DIAGNOSIS — Z79810 Long term (current) use of selective estrogen receptor modulators (SERMs): Secondary | ICD-10-CM | POA: Insufficient documentation

## 2015-12-24 DIAGNOSIS — R928 Other abnormal and inconclusive findings on diagnostic imaging of breast: Secondary | ICD-10-CM | POA: Diagnosis present

## 2015-12-24 DIAGNOSIS — Z79899 Other long term (current) drug therapy: Secondary | ICD-10-CM | POA: Insufficient documentation

## 2015-12-24 DIAGNOSIS — Z853 Personal history of malignant neoplasm of breast: Secondary | ICD-10-CM | POA: Diagnosis not present

## 2015-12-24 DIAGNOSIS — N6489 Other specified disorders of breast: Secondary | ICD-10-CM | POA: Insufficient documentation

## 2015-12-24 DIAGNOSIS — Z7982 Long term (current) use of aspirin: Secondary | ICD-10-CM | POA: Diagnosis not present

## 2015-12-24 HISTORY — PX: BREAST EXCISIONAL BIOPSY: SUR124

## 2015-12-24 HISTORY — PX: BREAST LUMPECTOMY WITH RADIOACTIVE SEED LOCALIZATION: SHX6424

## 2015-12-24 SURGERY — BREAST LUMPECTOMY WITH RADIOACTIVE SEED LOCALIZATION
Anesthesia: General | Site: Breast | Laterality: Left

## 2015-12-24 MED ORDER — MIDAZOLAM HCL 2 MG/2ML IJ SOLN
1.0000 mg | INTRAMUSCULAR | Status: DC | PRN
  Administered 2015-12-24: 2 mg via INTRAVENOUS

## 2015-12-24 MED ORDER — FENTANYL CITRATE (PF) 100 MCG/2ML IJ SOLN
INTRAMUSCULAR | Status: AC
  Filled 2015-12-24: qty 2

## 2015-12-24 MED ORDER — CEFAZOLIN SODIUM-DEXTROSE 2-4 GM/100ML-% IV SOLN: 2.0000 g | INTRAVENOUS | Status: DC

## 2015-12-24 MED ORDER — ONDANSETRON HCL 4 MG/2ML IJ SOLN
INTRAMUSCULAR | Status: DC | PRN
  Administered 2015-12-24: 4 mg via INTRAVENOUS

## 2015-12-24 MED ORDER — HYDROMORPHONE HCL 1 MG/ML IJ SOLN: 0.2500 mg | INTRAMUSCULAR | Status: DC | PRN

## 2015-12-24 MED ORDER — CHLORHEXIDINE GLUCONATE CLOTH 2 % EX PADS: 6.0000 | MEDICATED_PAD | Freq: Once | CUTANEOUS | Status: DC

## 2015-12-24 MED ORDER — PHENYLEPHRINE 40 MCG/ML (10ML) SYRINGE FOR IV PUSH (FOR BLOOD PRESSURE SUPPORT)
PREFILLED_SYRINGE | INTRAVENOUS | Status: DC | PRN
  Administered 2015-12-24: 80 ug via INTRAVENOUS

## 2015-12-24 MED ORDER — MIDAZOLAM HCL 2 MG/2ML IJ SOLN
INTRAMUSCULAR | Status: AC
  Filled 2015-12-24: qty 2

## 2015-12-24 MED ORDER — LACTATED RINGERS IV SOLN
INTRAVENOUS | Status: DC
  Administered 2015-12-24 (×2): via INTRAVENOUS

## 2015-12-24 MED ORDER — LIDOCAINE 2% (20 MG/ML) 5 ML SYRINGE
INTRAMUSCULAR | Status: DC | PRN
  Administered 2015-12-24: 60 mg via INTRAVENOUS

## 2015-12-24 MED ORDER — GLYCOPYRROLATE 0.2 MG/ML IJ SOLN: 0.2000 mg | Freq: Once | INTRAMUSCULAR | Status: DC | PRN

## 2015-12-24 MED ORDER — PROPOFOL 500 MG/50ML IV EMUL
INTRAVENOUS | Status: AC
  Filled 2015-12-24: qty 50

## 2015-12-24 MED ORDER — CEFAZOLIN SODIUM-DEXTROSE 2-4 GM/100ML-% IV SOLN
INTRAVENOUS | Status: AC
  Filled 2015-12-24: qty 100

## 2015-12-24 MED ORDER — ACETAMINOPHEN 500 MG PO TABS
1000.0000 mg | ORAL_TABLET | ORAL | Status: AC
  Administered 2015-12-24: 1000 mg via ORAL

## 2015-12-24 MED ORDER — GABAPENTIN 300 MG PO CAPS
ORAL_CAPSULE | ORAL | Status: AC
  Filled 2015-12-24: qty 1

## 2015-12-24 MED ORDER — DEXAMETHASONE SODIUM PHOSPHATE 10 MG/ML IJ SOLN
INTRAMUSCULAR | Status: AC
  Filled 2015-12-24: qty 1

## 2015-12-24 MED ORDER — PROPOFOL 10 MG/ML IV BOLUS
INTRAVENOUS | Status: DC | PRN
  Administered 2015-12-24: 150 mg via INTRAVENOUS

## 2015-12-24 MED ORDER — SCOPOLAMINE 1 MG/3DAYS TD PT72: 1.0000 | MEDICATED_PATCH | Freq: Once | TRANSDERMAL | Status: DC | PRN

## 2015-12-24 MED ORDER — HYDROCODONE-ACETAMINOPHEN 5-325 MG PO TABS: 1.0000 | ORAL_TABLET | ORAL | 0 refills | Status: DC | PRN

## 2015-12-24 MED ORDER — BUPIVACAINE-EPINEPHRINE (PF) 0.25% -1:200000 IJ SOLN
INTRAMUSCULAR | Status: DC | PRN
  Administered 2015-12-24: 10 mL via PERINEURAL

## 2015-12-24 MED ORDER — LIDOCAINE 2% (20 MG/ML) 5 ML SYRINGE
INTRAMUSCULAR | Status: AC
  Filled 2015-12-24: qty 5

## 2015-12-24 MED ORDER — GABAPENTIN 300 MG PO CAPS
300.0000 mg | ORAL_CAPSULE | ORAL | Status: AC
  Administered 2015-12-24: 300 mg via ORAL

## 2015-12-24 MED ORDER — FENTANYL CITRATE (PF) 100 MCG/2ML IJ SOLN
50.0000 ug | INTRAMUSCULAR | Status: DC | PRN
  Administered 2015-12-24: 100 ug via INTRAVENOUS
  Administered 2015-12-24: 25 ug via INTRAVENOUS

## 2015-12-24 MED ORDER — ONDANSETRON HCL 4 MG/2ML IJ SOLN
INTRAMUSCULAR | Status: AC
  Filled 2015-12-24: qty 2

## 2015-12-24 MED ORDER — DEXAMETHASONE SODIUM PHOSPHATE 4 MG/ML IJ SOLN
INTRAMUSCULAR | Status: DC | PRN
  Administered 2015-12-24: 10 mg via INTRAVENOUS

## 2015-12-24 MED ORDER — CELECOXIB 200 MG PO CAPS
ORAL_CAPSULE | ORAL | Status: AC
Start: 2015-12-24 — End: 2015-12-24
  Filled 2015-12-24: qty 2

## 2015-12-24 MED ORDER — BUPIVACAINE-EPINEPHRINE (PF) 0.25% -1:200000 IJ SOLN
INTRAMUSCULAR | Status: AC
  Filled 2015-12-24: qty 30

## 2015-12-24 MED ORDER — ACETAMINOPHEN 500 MG PO TABS
ORAL_TABLET | ORAL | Status: AC
  Filled 2015-12-24: qty 2

## 2015-12-24 MED ORDER — CELECOXIB 400 MG PO CAPS
400.0000 mg | ORAL_CAPSULE | ORAL | Status: AC
  Administered 2015-12-24: 400 mg via ORAL

## 2015-12-24 SURGICAL SUPPLY — 47 items
BINDER BREAST LRG (GAUZE/BANDAGES/DRESSINGS) IMPLANT
BINDER BREAST MEDIUM (GAUZE/BANDAGES/DRESSINGS) IMPLANT
BINDER BREAST XLRG (GAUZE/BANDAGES/DRESSINGS) IMPLANT
BINDER BREAST XXLRG (GAUZE/BANDAGES/DRESSINGS) IMPLANT
BLADE SURG 15 STRL LF DISP TIS (BLADE) ×1 IMPLANT
BLADE SURG 15 STRL SS (BLADE) ×2
CANISTER SUC SOCK COL 7IN (MISCELLANEOUS) IMPLANT
CANISTER SUCT 1200ML W/VALVE (MISCELLANEOUS) IMPLANT
CHLORAPREP W/TINT 26ML (MISCELLANEOUS) ×3 IMPLANT
CLIP TI WIDE RED SMALL 6 (CLIP) IMPLANT
COVER BACK TABLE 60X90IN (DRAPES) ×3 IMPLANT
COVER MAYO STAND STRL (DRAPES) ×3 IMPLANT
COVER PROBE W GEL 5X96 (DRAPES) ×3 IMPLANT
DECANTER SPIKE VIAL GLASS SM (MISCELLANEOUS) IMPLANT
DEVICE DUBIN W/COMP PLATE 8390 (MISCELLANEOUS) ×3 IMPLANT
DRAPE LAPAROSCOPIC ABDOMINAL (DRAPES) ×3 IMPLANT
DRAPE UTILITY XL STRL (DRAPES) ×3 IMPLANT
ELECT COATED BLADE 2.86 ST (ELECTRODE) ×3 IMPLANT
ELECT REM PT RETURN 9FT ADLT (ELECTROSURGICAL) ×3
ELECTRODE REM PT RTRN 9FT ADLT (ELECTROSURGICAL) ×1 IMPLANT
GLOVE BIOGEL PI IND STRL 7.0 (GLOVE) ×2 IMPLANT
GLOVE BIOGEL PI IND STRL 8 (GLOVE) ×1 IMPLANT
GLOVE BIOGEL PI INDICATOR 7.0 (GLOVE) ×4
GLOVE BIOGEL PI INDICATOR 8 (GLOVE) ×2
GLOVE ECLIPSE 7.5 STRL STRAW (GLOVE) ×3 IMPLANT
GLOVE SURG SS PI 6.5 STRL IVOR (GLOVE) ×3 IMPLANT
GOWN STRL REUS W/ TWL LRG LVL3 (GOWN DISPOSABLE) ×1 IMPLANT
GOWN STRL REUS W/ TWL XL LVL3 (GOWN DISPOSABLE) ×1 IMPLANT
GOWN STRL REUS W/TWL LRG LVL3 (GOWN DISPOSABLE) ×2
GOWN STRL REUS W/TWL XL LVL3 (GOWN DISPOSABLE) ×2
ILLUMINATOR WAVEGUIDE N/F (MISCELLANEOUS) ×3 IMPLANT
KIT MARKER MARGIN INK (KITS) ×3 IMPLANT
LIQUID BAND (GAUZE/BANDAGES/DRESSINGS) ×3 IMPLANT
NEEDLE HYPO 25X1 1.5 SAFETY (NEEDLE) ×3 IMPLANT
NS IRRIG 1000ML POUR BTL (IV SOLUTION) IMPLANT
PACK BASIN DAY SURGERY FS (CUSTOM PROCEDURE TRAY) ×3 IMPLANT
PENCIL BUTTON HOLSTER BLD 10FT (ELECTRODE) ×3 IMPLANT
SLEEVE SCD COMPRESS KNEE MED (MISCELLANEOUS) ×3 IMPLANT
SPONGE LAP 4X18 X RAY DECT (DISPOSABLE) ×3 IMPLANT
SUT MON AB 5-0 PS2 18 (SUTURE) ×3 IMPLANT
SUT VICRYL 3-0 CR8 SH (SUTURE) ×3 IMPLANT
SYR CONTROL 10ML LL (SYRINGE) ×3 IMPLANT
TOWEL OR 17X24 6PK STRL BLUE (TOWEL DISPOSABLE) ×3 IMPLANT
TOWEL OR NON WOVEN STRL DISP B (DISPOSABLE) ×3 IMPLANT
TUBE CONNECTING 20'X1/4 (TUBING)
TUBE CONNECTING 20X1/4 (TUBING) IMPLANT
YANKAUER SUCT BULB TIP NO VENT (SUCTIONS) IMPLANT

## 2015-12-24 NOTE — Anesthesia Postprocedure Evaluation (Signed)
Anesthesia Post Note  Patient: [name] [name]  Procedure(s) Performed: Procedure(s) (LRB): LEFT BREAST LUMPECTOMY WITH RADIOACTIVE SEED LOCALIZATION (Left)  Patient location during evaluation: PACU Anesthesia Type: General Level of consciousness: awake and alert Pain management: pain level controlled Vital Signs Assessment: post-procedure vital signs reviewed and stable Respiratory status: spontaneous breathing, nonlabored ventilation and respiratory function stable Cardiovascular status: blood pressure returned to baseline and stable Postop Assessment: no signs of nausea or vomiting Anesthetic complications: no    Last Vitals:  Vitals:   12/24/15 0900 12/24/15 0925  BP: 133/84 (!) 144/85  Pulse: 83 85  Resp: 16 18  Temp:  37.1 C    Last Pain:  Vitals:   12/24/15 0925  TempSrc: Oral  PainSc: 0-No pain                 Eathan Groman,W. EDMOND

## 2015-12-24 NOTE — Op Note (Signed)
Preoperative Diagnosis: left breast abnormal mamogram  Postoprative Diagnosis: left breast abnormal mamogram  Procedure: Procedure(s): LEFT BREAST LUMPECTOMY WITH RADIOACTIVE SEED LOCALIZATION   Surgeon: Excell Seltzer T   Assistants: None  Anesthesia:  General LMA anesthesia  Indications: Patient is a 55 year old female with a personal history of stage IIIa invasive lobular carcinoma of the left breast status post lumpectomy,  Axillary node dissection radiation therapy and chemotherapy. Recent mammogram shows an area of distortion in the upper outer left breast away from the previous lumpectomy site with associated calcifications. Stereotactic biopsy revealed fibrocystic disease which was felt to be discordant and  Excisional biopsy was recommended. Patient desires to proceed. We previously discussed the nature of the surgery indications and risks detailed elsewhere.    Procedure Detail:  She had previously undergone accurate placement of radioactive seed at the area of concern in the  Upper left breast. See placement was confirmed in the holding area. She was brought to the operating room, placed in the supine position on the operating table and laryngeal mask general anesthesia induced. The left breast was widely sterilely prepped and draped. She received preoperative IV antibiotics. Patient timeout was performed and correct procedure verified. I used a circumareolar incision superiorly and dissection carried down into the subtenon's tissue. Using the Invuity retractor a skin subcutaneous flap was then raised superiorly over the seed confirmed with the neoprobe. The seed was fairly superficial  In the breast capsule. Using the neoprobe for guidance and approximately 2 cm specimen of tissue was excised around the seed. The specimen was inked for margins. Specimen x-ray showed the seed and biopsy clip centrally located. This was sent for permanent pathology.Hemostasis was assured in the wound.  A single marking clip was left in the lumpectomy bed. Soft tissue was infiltrated with Marcaine. The deep breast and subcutaneous Tissue was closed with interrupted 3-0 Vicryl and the skin with subcuticular 5-0 Monocryl and Liquiban. Sponge needle and instrument counts were correct.    Findings: As above  Estimated Blood Loss:  Minimal         Drains: none  Blood Given: none          Specimens: Left breast tissue oriented        Complications:  * No complications entered in OR log *         Disposition: PACU - hemodynamically stable.         Condition: stable

## 2015-12-24 NOTE — Anesthesia Procedure Notes (Signed)
Procedure Name: LMA Insertion Date/Time: 12/24/2015 7:36 AM Performed by: Lyndee Leo Pre-anesthesia Checklist: Patient identified, Emergency Drugs available, Suction available and Patient being monitored Patient Re-evaluated:Patient Re-evaluated prior to inductionOxygen Delivery Method: Circle system utilized Preoxygenation: Pre-oxygenation with 100% oxygen Intubation Type: IV induction Ventilation: Mask ventilation without difficulty LMA: LMA inserted LMA Size: 4.0 Number of attempts: 1 Airway Equipment and Method: Bite block Placement Confirmation: positive ETCO2 Tube secured with: Tape Dental Injury: Teeth and Oropharynx as per pre-operative assessment

## 2015-12-24 NOTE — Transfer of Care (Signed)
Immediate Anesthesia Transfer of Care Note  Patient: [name] [name]  Procedure(s) Performed: Procedure(s): LEFT BREAST LUMPECTOMY WITH RADIOACTIVE SEED LOCALIZATION (Left)  Patient Location: PACU  Anesthesia Type:General  Level of Consciousness: awake, sedated and patient cooperative  Airway & Oxygen Therapy: Patient Spontanous Breathing and Patient connected to face mask oxygen  Post-op Assessment: Report given to RN and Post -op Vital signs reviewed and stable  Post vital signs: Reviewed and stable  Last Vitals:  Vitals:   12/24/15 0631  BP: 125/81  Pulse: 83  Resp: 18  Temp: 36.7 C    Last Pain:  Vitals:   12/24/15 0631  TempSrc: Oral      Patients Stated Pain Goal: 0 ([id number])  Complications: No apparent anesthesia complications

## 2015-12-24 NOTE — Anesthesia Preprocedure Evaluation (Addendum)
Anesthesia Evaluation  Patient identified by MRN, date of birth, ID band Patient awake    Reviewed: Allergy & Precautions, H&P , NPO status , Patient's Chart, lab work & pertinent test results  Airway Mallampati: II  TM Distance: >3 FB Neck ROM: Full    Dental no notable dental hx. (+) Teeth Intact, Dental Advisory Given   Pulmonary neg pulmonary ROS,    Pulmonary exam normal breath sounds clear to auscultation       Cardiovascular negative cardio ROS   Rhythm:Regular Rate:Normal     Neuro/Psych negative neurological ROS  negative psych ROS   GI/Hepatic negative GI ROS, Neg liver ROS,   Endo/Other  negative endocrine ROS  Renal/GU negative Renal ROS  negative genitourinary   Musculoskeletal   Abdominal   Peds  Hematology negative hematology ROS (+) anemia ,   Anesthesia Other Findings   Reproductive/Obstetrics negative OB ROS                            Anesthesia Physical Anesthesia Plan  ASA: II  Anesthesia Plan: General   Post-op Pain Management:    Induction: Intravenous  Airway Management Planned: LMA  Additional Equipment:   Intra-op Plan:   Post-operative Plan: Extubation in OR  Informed Consent: I have reviewed the patients History and Physical, chart, labs and discussed the procedure including the risks, benefits and alternatives for the proposed anesthesia with the patient or authorized representative who has indicated his/her understanding and acceptance.   Dental advisory given  Plan Discussed with: CRNA  Anesthesia Plan Comments:         Anesthesia Quick Evaluation

## 2015-12-24 NOTE — Discharge Instructions (Signed)
[facility] °Office Phone Number [phone] ° °BREAST BIOPSY/ PARTIAL MASTECTOMY: POST OP INSTRUCTIONS ° °Always review your discharge instruction sheet given to you by the facility where your surgery was performed. ° °IF YOU HAVE DISABILITY OR FAMILY LEAVE FORMS, YOU MUST BRING THEM TO THE OFFICE FOR PROCESSING.  DO NOT GIVE THEM TO YOUR DOCTOR. ° °1. A prescription for pain medication may be given to you upon discharge.  Take your pain medication as prescribed, if needed.  If narcotic pain medicine is not needed, then you may take acetaminophen (Tylenol) or ibuprofen (Advil) as needed. °2. Take your usually prescribed medications unless otherwise directed °3. If you need a refill on your pain medication, please contact your pharmacy.  They will contact our office to request authorization.  Prescriptions will not be filled after 5pm or on week-ends. °4. You should eat very light the first 24 hours after surgery, such as soup, crackers, pudding, etc.  Resume your normal diet the day after surgery. °5. Most patients will experience some swelling and bruising in the breast.  Ice packs and a good support bra will help.  Swelling and bruising can take several days to resolve.  °6. It is common to experience some constipation if taking pain medication after surgery.  Increasing fluid intake and taking a stool softener will usually help or prevent this problem from occurring.  A mild laxative (Milk of Magnesia or Miralax) should be taken according to package directions if there are no bowel movements after 48 hours. °7. Unless discharge instructions indicate otherwise, you may remove your bandages 24-48 hours after surgery, and you may shower at that time.  You may have steri-strips (small skin tapes) in place directly over the incision.  These strips should be left on the skin for 7-10 days.  If your surgeon used skin glue on the incision, you may shower in 24 hours.  The glue will flake off over the  next 2-3 weeks.  Any sutures or staples will be removed at the office during your follow-up visit. °8. ACTIVITIES:  You may resume regular daily activities (gradually increasing) beginning the next day.  Wearing a good support bra or sports bra minimizes pain and swelling.  You may have sexual intercourse when it is comfortable. °a. You may drive when you no longer are taking prescription pain medication, you can comfortably wear a seatbelt, and you can safely maneuver your car and apply brakes. °b. RETURN TO WORK:  ______________________________________________________________________________________ °9. You should see your doctor in the office for a follow-up appointment approximately two weeks after your surgery.  Your doctor’s nurse will typically make your follow-up appointment when she calls you with your pathology report.  Expect your pathology report 2-3 business days after your surgery.  You may call to check if you do not hear from us after three days. °10. OTHER INSTRUCTIONS: _______________________________________________________________________________________________ _____________________________________________________________________________________________________________________________________ °_____________________________________________________________________________________________________________________________________ °_____________________________________________________________________________________________________________________________________ ° °WHEN TO CALL YOUR DOCTOR: °1. Fever over 101.0 °2. Nausea and/or vomiting. °3. Extreme swelling or bruising. °4. Continued bleeding from incision. °5. Increased pain, redness, or drainage from the incision. ° °The clinic staff is available to answer your questions during regular business hours.  Please don’t hesitate to call and ask to speak to one of the nurses for clinical concerns.  If you have a medical emergency, go to the nearest  emergency room or call 911.  A surgeon from [facility] is always on call at the hospital. ° °For further questions, please visit [email or web address]  ° ° ° °  Post Anesthesia Home Care Instructions ° °Activity: °Get plenty of rest for the remainder of the day. A responsible adult should stay with you for 24 hours following the procedure.  °For the next 24 hours, DO NOT: °-Drive a car °-Operate machinery °-Drink alcoholic beverages °-Take any medication unless instructed by your physician °-Make any legal decisions or sign important papers. ° °Meals: °Start with liquid foods such as gelatin or soup. Progress to regular foods as tolerated. Avoid greasy, spicy, heavy foods. If nausea and/or vomiting occur, drink only clear liquids until the nausea and/or vomiting subsides. Call your physician if vomiting continues. ° °Special Instructions/Symptoms: °Your throat may feel dry or sore from the anesthesia or the breathing tube placed in your throat during surgery. If this causes discomfort, gargle with warm salt water. The discomfort should disappear within 24 hours. ° °If you had a scopolamine patch placed behind your ear for the management of post- operative nausea and/or vomiting: ° °1. The medication in the patch is effective for 72 hours, after which it should be removed.  Wrap patch in a tissue and discard in the trash. Wash hands thoroughly with soap and water. °2. You may remove the patch earlier than 72 hours if you experience unpleasant side effects which may include dry mouth, dizziness or visual disturbances. °3. Avoid touching the patch. Wash your hands with soap and water after contact with the patch. °  ° °

## 2015-12-24 NOTE — Interval H&P Note (Signed)
History and Physical Interval Note:  12/24/2015 7:28 AM  [name] [name]  has presented today for surgery, with the diagnosis of left breast abnormal mamogram  The various methods of treatment have been discussed with the patient and family. After consideration of risks, benefits and other options for treatment, the patient has consented to  Procedure(s): LEFT BREAST LUMPECTOMY WITH RADIOACTIVE SEED LOCALIZATION (Left) as a surgical intervention .  The patient's history has been reviewed, patient examined, no change in status, stable for surgery.  I have reviewed the patient's chart and labs.  Questions were answered to the patient's satisfaction.     [name] T

## 2015-12-27 ENCOUNTER — Encounter (HOSPITAL_BASED_OUTPATIENT_CLINIC_OR_DEPARTMENT_OTHER): Payer: Self-pay | Admitting: General Surgery

## 2016-02-16 ENCOUNTER — Ambulatory Visit: Payer: BLUE CROSS/BLUE SHIELD | Attending: Orthopedic Surgery | Admitting: Physical Therapy

## 2016-02-16 DIAGNOSIS — M25511 Pain in right shoulder: Secondary | ICD-10-CM | POA: Insufficient documentation

## 2016-02-16 DIAGNOSIS — G8929 Other chronic pain: Secondary | ICD-10-CM | POA: Diagnosis present

## 2016-02-16 NOTE — Therapy (Addendum)
[facility] [place], [place], [address] Phone: [phone]   Fax:  [phone]  Physical Therapy Evaluation  Patient Details  Name: [name] [name] MRN: [id number] Date of Birth: 1960/08/16 Referring Provider: Wyatt Portela PA-C.  Encounter Date: 02/16/2016      PT End of Session - 02/16/16 1240    Visit Number 1   Number of Visits 4   Date for PT Re-Evaluation 03/08/16   PT Start Time 1125   PT Stop Time 1157   PT Time Calculation (min) 32 min   Activity Tolerance Patient tolerated treatment well   Behavior During Therapy WFL for tasks assessed/performed      Past Medical History:  Diagnosis Date  . Anxiety disorder    Mild: treated by her gynecologist with selective serotonin reuptake inhibitor  . Arthritis    hands and feet  . Family history of malignant neoplasm of breast   . Fibroid uterus    hysterctomy  . Hot flashes   . Hyperlipidemia   . Malignant neoplasm of breast (female), unspecified site ([place]) 08/07/13   left breast invasive mammary ca in situ  . Nonspecific abnormal electrocardiogram (ECG) (EKG) 2009  . Osteoarthritis   . Palpitations 2009   Improved; Secondary to premature ventricular contractions. No problems since-pt states was caring for ailing father, anxiety  . Tick bite of right thigh 2016  . Wears glasses     Past Surgical History:  Procedure Laterality Date  . ABDOMINAL HYSTERECTOMY  02/07/2011   Procedure: HYSTERECTOMY ABDOMINAL;  Surgeon: [name];  Location: [facility];  Service: Gynecology;  Laterality: N/A;  . BREAST LUMPECTOMY WITH NEEDLE LOCALIZATION AND AXILLARY LYMPH NODE DISSECTION Left 09/09/2013   Procedure: LEFT BREAST LUMPECTOMY WITH NEEDLE LOCALIZATION AND LEFT AXILLARY LYMPH NODE DISSECTION;  Surgeon: [name], MD;  Location: [place];  Service: General;  Laterality: Left;  . BREAST LUMPECTOMY WITH RADIOACTIVE SEED LOCALIZATION Right  10/07/2014   Procedure: RIGHT BREAST LUMPECTOMY WITH RADIOACTIVE SEED LOCALIZATION;  Surgeon: Excell Seltzer, MD;  Location: [place];  Service: General;  Laterality: Right;  . BREAST LUMPECTOMY WITH RADIOACTIVE SEED LOCALIZATION Left 12/24/2015   Procedure: LEFT BREAST LUMPECTOMY WITH RADIOACTIVE SEED LOCALIZATION;  Surgeon: Excell Seltzer, MD;  Location: [place];  Service: General;  Laterality: Left;  . CESAREAN SECTION    . DILATION AND CURETTAGE OF UTERUS    . ENDOMETRIAL ABLATION  2006  . LAPAROSCOPIC ASSISTED VAGINAL HYSTERECTOMY  02/07/2011   Procedure: LAPAROSCOPIC ASSISTED VAGINAL HYSTERECTOMY;  Surgeon: [name];  Location: [facility];  Service: Gynecology;  Laterality: N/A;  . PORTACATH PLACEMENT Right 09/29/2013   Procedure: INSERTION PORT-A-CATH;  Surgeon: [name], MD;  Location: [place];  Service: General;  Laterality: Right;  . THYROIDECTOMY, PARTIAL      There were no vitals filed for this visit.       Subjective Assessment - 02/16/16 1244    Subjective The patient reports that approximately 8 to 10 months ago after painting (using a roller) her right shoulder began to hurt very badly.  Due to unrelentling pain she saw an orthopedic doctor and received an injection.  This has been very helpful and she has no pain at rest today.  Her pain rises slightly with repetitive right UE movements.  She has some stiffness in between her shoulder blades but other than that she is doing very well.  [facility] PT Assessment - 02/16/16 0001      Assessment   Medical Diagnosis Right shoulder Impingement syndrome.   Referring Provider [facility] PA-C.   Onset Date/Surgical Date --  8-10 months ago.     Precautions   Precautions None     Restrictions   Weight Bearing Restrictions No     Balance Screen   Has the patient fallen in the past 6 months No   Has the patient had a decrease in activity level  because of a fear of falling?  No   Is the patient reluctant to leave their home because of a fear of falling?  No     Home Environment   Living Environment Private residence     Prior Function   Level of Independence Independent     Posture/Postural Control   Posture Comments Mild shoulder rounding.     ROM / Strength   AROM / PROM / Strength AROM;Strength     AROM   Overall AROM Comments Normal AROM of right shoulder with some increase with moving righ hand behind back.     Strength   Overall Strength Comments Normal right UE strength.     Palpation   Palpation comment Tender to palpation at right medial scapular border.     Special Tests    Special Tests Rotator Cuff Impingement  Normal bilateral UE DTR's.   Rotator Cuff Impingment tests Neer impingement test;Hawkins- Merrilyn Puma test;Empty Can test;Speed's test     Neer Impingement test    Findings Negative   Side Right     Hawkins-Kennedy test   Findings Positive   Side Right     Empty Can test   Findings Negative   Side Right     Ambulation/Gait   Gait Comments WNL.                           PT Education - 02/16/16 1239    Education provided Yes   Person(s) Educated Patient   Methods Explanation;Demonstration;Handout   Comprehension Verbalized understanding;Returned demonstration             PT Long Term Goals - 02/16/16 1254      PT LONG TERM GOAL #1   Title Independent with an advanced HEP.               Plan - 02/16/16 1241    Clinical Impression Statement The patient presents with no right shoulder pain but does have a positive Impingement test.  Strength and UE DTR's are normal.   Rehab Potential Excellent   PT Frequency 1x / week   PT Duration 4 weeks   PT Treatment/Interventions ADLs/Self Care Home Management;Electrical Stimulation;Therapeutic activities;Therapeutic exercise;Manual techniques   PT Next Visit Plan RW4.  Please provide patient with red theraband.   Prone rows; full can and scapular retraction.  May try e'stim at right medial scapular border if patient would like to.      Patient will benefit from skilled therapeutic intervention in order to improve the following deficits and impairments:  Decreased activity tolerance  Visit Diagnosis: Chronic right shoulder pain - Plan: PT plan of care cert/re-cert     Problem List Patient Active Problem List   Diagnosis Date Noted  . Encounter for preventive health examination 11/12/2015  . Use of tamoxifen (Nolvadex) 08/10/2015  . Thyroid fullness 08/10/2015  . Health care maintenance 08/10/2015  . Elevated ALT measurement 08/10/2015  . Skin lesion  08/10/2015  . Hot flashes 12/01/2014  . 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 Drug induced neutropenia(288.03) 11/30/2013  . Family history of malignant neoplasm of breast   . Breast cancer of upper-inner quadrant of left female breast ([place]) 08/11/2013  . ABNORMAL ELECTROCARDIOGRAM 02/03/2009    Dong Nimmons, [place] MPT 02/16/2016, 12:56 PM  [facility] [place], [place], [address] Phone: [phone]   Fax:  [phone]  Name: [name] [name] MRN: [id number] Date of Birth: 12/27/1960  PHYSICAL THERAPY DISCHARGE SUMMARY  Visits from Start of Care: 1.  Current functional level related to goals / functional outcomes: See above.   Remaining deficits: See below.   Education / Equipment:   Plan: Patient agrees to discharge.  Patient goals were not met. Patient is being discharged due to not returning since the last visit.  ?????         Mali Shakyla Nolley MPT

## 2016-03-13 ENCOUNTER — Ambulatory Visit (INDEPENDENT_AMBULATORY_CARE_PROVIDER_SITE_OTHER): Payer: BLUE CROSS/BLUE SHIELD | Admitting: Family Medicine

## 2016-03-13 ENCOUNTER — Encounter: Payer: Self-pay | Admitting: Family Medicine

## 2016-03-13 VITALS — BP 120/81 | HR 103 | Temp 97.6°F | Resp 20 | Wt 153.5 lb

## 2016-03-13 DIAGNOSIS — J0100 Acute maxillary sinusitis, unspecified: Secondary | ICD-10-CM | POA: Diagnosis not present

## 2016-03-13 MED ORDER — DOXYCYCLINE HYCLATE 100 MG PO TABS: 100.0000 mg | ORAL_TABLET | Freq: Two times a day (BID) | ORAL | 0 refills | Status: DC

## 2016-03-13 NOTE — Patient Instructions (Addendum)
Doxycyline 100 mg BID 10 days.  Rest and hydrate.  Mucinex DM for decreasing secretions and cough.     Sinusitis, Adult Sinusitis is redness, soreness, and inflammation of the paranasal sinuses. Paranasal sinuses are air pockets within the bones of your face. They are located beneath your eyes, in the middle of your forehead, and above your eyes. In healthy paranasal sinuses, mucus is able to drain out, and air is able to circulate through them by way of your nose. However, when your paranasal sinuses are inflamed, mucus and air can become trapped. This can allow bacteria and other germs to grow and cause infection. Sinusitis can develop quickly and last only a short time (acute) or continue over a long period (chronic). Sinusitis that lasts for more than 12 weeks is considered chronic. CAUSES Causes of sinusitis include:  Allergies.  Structural abnormalities, such as displacement of the cartilage that separates your nostrils (deviated septum), which can decrease the air flow through your nose and sinuses and affect sinus drainage.  Functional abnormalities, such as when the small hairs (cilia) that line your sinuses and help remove mucus do not work properly or are not present. SIGNS AND SYMPTOMS Symptoms of acute and chronic sinusitis are the same. The primary symptoms are pain and pressure around the affected sinuses. Other symptoms include:  Upper toothache.  Earache.  Headache.  Bad breath.  Decreased sense of smell and taste.  A cough, which worsens when you are lying flat.  Fatigue.  Fever.  Thick drainage from your nose, which often is green and may contain pus (purulent).  Swelling and warmth over the affected sinuses. DIAGNOSIS Your health care provider will perform a physical exam. During your exam, your health care provider may perform any of the following to help determine if you have acute sinusitis or chronic sinusitis:  Look in your nose for signs of abnormal  growths in your nostrils (nasal polyps).  Tap over the affected sinus to check for signs of infection.  View the inside of your sinuses using an imaging device that has a light attached (endoscope). If your health care provider suspects that you have chronic sinusitis, one or more of the following tests may be recommended:  Allergy tests.  Nasal culture. A sample of mucus is taken from your nose, sent to a lab, and screened for bacteria.  Nasal cytology. A sample of mucus is taken from your nose and examined by your health care provider to determine if your sinusitis is related to an allergy. TREATMENT Most cases of acute sinusitis are related to a viral infection and will resolve on their own within 10 days. Sometimes, medicines are prescribed to help relieve symptoms of both acute and chronic sinusitis. These may include pain medicines, decongestants, nasal steroid sprays, or saline sprays. However, for sinusitis related to a bacterial infection, your health care provider will prescribe antibiotic medicines. These are medicines that will help kill the bacteria causing the infection. Rarely, sinusitis is caused by a fungal infection. In these cases, your health care provider will prescribe antifungal medicine. For some cases of chronic sinusitis, surgery is needed. Generally, these are cases in which sinusitis recurs more than 3 times per year, despite other treatments. HOME CARE INSTRUCTIONS  Drink plenty of water. Water helps thin the mucus so your sinuses can drain more easily.  Use a humidifier.  Inhale steam 3-4 times a day (for example, sit in the bathroom with the shower running).  Apply a warm, moist washcloth  to your face 3-4 times a day, or as directed by your health care provider.  Use saline nasal sprays to help moisten and clean your sinuses.  Take medicines only as directed by your health care provider.  If you were prescribed either an antibiotic or antifungal medicine,  finish it all even if you start to feel better. SEEK IMMEDIATE MEDICAL CARE IF:  You have increasing pain or severe headaches.  You have nausea, vomiting, or drowsiness.  You have swelling around your face.  You have vision problems.  You have a stiff neck.  You have difficulty breathing.   This information is not intended to replace advice given to you by your health care provider. Make sure you discuss any questions you have with your health care provider.   Document Released: 04/17/2005 Document Revised: 05/08/2014 Document Reviewed: 05/02/2011 Elsevier Interactive Patient Education [employer].

## 2016-03-13 NOTE — Progress Notes (Signed)
[name] [name] , 04/18/1961, 55 y.o., female MRN: [id number] Patient Care Team    Relationship Specialty Notifications Start End  Ma Hillock, DO PCP - General Family Medicine  07/16/15   Excell Seltzer, MD Consulting Physician General Surgery  08/10/15   Chauncey Cruel, MD Consulting Physician Oncology  08/10/15   [name], MD Consulting Physician Obstetrics and Gynecology  08/10/15     CC: URI Subjective: Pt presents for an acute OV with complaints of URI --sx since end of October duration.  Associated symptoms include nasal congestion, rhinorrhea, scratchy throat, headache, cough, hoarseness and wheezing at night.  She was seen at her work employee health and provided with z-pack (Oct 31) and allegra. She does not feels this was helpful and her symptoms have continued.  Pt has tried mucinex, cough syrup and delsym to ease their symptoms.   No Known Allergies Social History  Substance Use Topics  . Smoking status: Never Smoker  . Smokeless tobacco: Never Used  . Alcohol use 1.2 oz/week    2 Glasses of wine per week   Past Medical History:  Diagnosis Date  . Anxiety disorder    Mild: treated by her gynecologist with selective serotonin reuptake inhibitor  . Arthritis    hands and feet  . Family history of malignant neoplasm of breast   . Fibroid uterus    hysterctomy  . Hot flashes   . Hyperlipidemia   . Malignant neoplasm of breast (female), unspecified site 08/07/13   left breast invasive mammary ca in situ  . Nonspecific abnormal electrocardiogram (ECG) (EKG) 2009  . Osteoarthritis   . Palpitations 2009   Improved; Secondary to premature ventricular contractions. No problems since-pt states was caring for ailing father, anxiety  . Tick bite of right thigh 2016  . Wears glasses    Past Surgical History:  Procedure Laterality Date  . ABDOMINAL HYSTERECTOMY  02/07/2011   Procedure: HYSTERECTOMY ABDOMINAL;  Surgeon: [name];  Location: [facility];   Service: Gynecology;  Laterality: N/A;  . BREAST LUMPECTOMY WITH NEEDLE LOCALIZATION AND AXILLARY LYMPH NODE DISSECTION Left 09/09/2013   Procedure: LEFT BREAST LUMPECTOMY WITH NEEDLE LOCALIZATION AND LEFT AXILLARY LYMPH NODE DISSECTION;  Surgeon: [name], MD;  Location: [facility];  Service: General;  Laterality: Left;  . BREAST LUMPECTOMY WITH RADIOACTIVE SEED LOCALIZATION Right 10/07/2014   Procedure: RIGHT BREAST LUMPECTOMY WITH RADIOACTIVE SEED LOCALIZATION;  Surgeon: Excell Seltzer, MD;  Location: [facility];  Service: General;  Laterality: Right;  . BREAST LUMPECTOMY WITH RADIOACTIVE SEED LOCALIZATION Left 12/24/2015   Procedure: LEFT BREAST LUMPECTOMY WITH RADIOACTIVE SEED LOCALIZATION;  Surgeon: Excell Seltzer, MD;  Location: [facility];  Service: General;  Laterality: Left;  . CESAREAN SECTION    . DILATION AND CURETTAGE OF UTERUS    . ENDOMETRIAL ABLATION  2006  . LAPAROSCOPIC ASSISTED VAGINAL HYSTERECTOMY  02/07/2011   Procedure: LAPAROSCOPIC ASSISTED VAGINAL HYSTERECTOMY;  Surgeon: [name];  Location: [facility];  Service: Gynecology;  Laterality: N/A;  . PORTACATH PLACEMENT Right 09/29/2013   Procedure: INSERTION PORT-A-CATH;  Surgeon: [name], MD;  Location: [facility];  Service: General;  Laterality: Right;  . THYROIDECTOMY, PARTIAL     Family History  Problem Relation Age of Onset  . Angina Father   . Aortic aneurysm Father   . Dementia Father   . CAD Father   . Hypertension Other   . Arrhythmia Other  Uncle- a fib  . Breast cancer Mother     dx 37s. Died at 82  . Non-Hodgkin's lymphoma Mother   . Arrhythmia Maternal Aunt     A fib  . Breast cancer Maternal Aunt 56    currently 47  . Thyroid disease Maternal Aunt   . Arrhythmia Maternal Aunt     A fib  . Breast cancer Maternal Aunt     dx 34s; currently 70s  . Colon cancer Maternal Grandmother     dx 107s; died at 67  .  Thyroid disease Maternal Grandmother   . Colon cancer Maternal Grandfather     dx 54s; died in 71s  . Cancer Paternal Uncle     unknown primary in early 47s  . Breast cancer Maternal Aunt     dx 61s; died in 58s  . Thyroid disease Maternal Uncle      Medication List       Accurate as of 03/13/16  2:45 PM. Always use your most recent med list.          aspirin EC 81 MG tablet Take 1 tablet (81 mg total) by mouth daily.   calcium carbonate 1500 (600 Ca) MG Tabs tablet Commonly known as:  OSCAL Take by mouth 2 (two) times daily with a meal.   diclofenac 75 MG EC tablet Commonly known as:  VOLTAREN Take 75 mg by mouth 2 (two) times daily as needed.   gabapentin 300 MG capsule Commonly known as:  NEURONTIN Take 1 capsule (300 mg total) by mouth at bedtime.   tamoxifen 20 MG tablet Commonly known as:  NOLVADEX Take 1 tablet (20 mg total) by mouth daily.   venlafaxine XR 37.5 MG 24 hr capsule Commonly known as:  EFFEXOR-XR Take 1 capsule (37.5 mg total) by mouth daily with breakfast.   Vitamin D 2000 units tablet Take 2,000 Units by mouth daily.       No results found for this or any previous visit (from the past 24 hour(s)). No results found.   ROS: Negative, with the exception of above mentioned in HPI   Objective:  BP 120/81 (BP Location: Right Arm, Patient Position: Sitting, Cuff Size: Normal)   Pulse (!) 103   Temp 97.6 F (36.4 C)   Resp 20   Wt 153 lb 8 oz (69.6 kg)   SpO2 97%   BMI 24.78 kg/m  Body mass index is 24.78 kg/m. Gen: Afebrile. No acute distress. Nontoxic in appearance, well developed, well nourished. Pleasant caucasian female.  HENT: AT. [place]. Bilateral TM visualized with bilateral air fluid levels present. MMM, no oral lesions. Bilateral nares with erythema and swelling. Throat without erythema or exudates. Mild cough and hoarseness present. TTP bilateral max sinus. . Eyes:Pupils Equal Round Reactive to light, Extraocular movements  intact,  Conjunctiva without redness, discharge or icterus. Neck/lymp/endocrine: Supple,bilateral ant cervical  lymphadenopathy CV: RRR Chest: CTAB, no wheeze or crackles. Bilateral rhonchi present. Good air movement, normal resp effort.  Abd: Soft. NTND. BS present.  Skin: no rashes, purpura or petechiae.  Neuro:  Normal gait. PERLA. EOMi. Alert. Oriented x3  Psych: Normal affect, dress and demeanor. Normal speech. Normal thought content and judgment.  Assessment/Plan: [name] [name] is a 54 y.o. female present for acute OV for  Doxycyline 100 mg BID 10 days.  Rest and hydrate.  Mucinex DM. Flonase.  F/U PRN   electronically signed by:  Howard Pouch, DO  [place]

## 2016-03-20 ENCOUNTER — Telehealth: Payer: Self-pay | Admitting: Oncology

## 2016-03-20 NOTE — Telephone Encounter (Signed)
lvm to inform pt of r/s appt date/timeper GM LOS

## 2016-03-22 ENCOUNTER — Ambulatory Visit: Payer: BLUE CROSS/BLUE SHIELD | Admitting: Oncology

## 2016-03-22 ENCOUNTER — Other Ambulatory Visit: Payer: BLUE CROSS/BLUE SHIELD

## 2016-03-28 ENCOUNTER — Telehealth: Payer: Self-pay | Admitting: Oncology

## 2016-03-28 NOTE — Telephone Encounter (Signed)
Appointments rescheduled per patient request.  °

## 2016-04-05 ENCOUNTER — Other Ambulatory Visit: Payer: Self-pay | Admitting: *Deleted

## 2016-04-05 DIAGNOSIS — C50212 Malignant neoplasm of upper-inner quadrant of left female breast: Secondary | ICD-10-CM

## 2016-04-06 ENCOUNTER — Other Ambulatory Visit (HOSPITAL_BASED_OUTPATIENT_CLINIC_OR_DEPARTMENT_OTHER): Payer: BLUE CROSS/BLUE SHIELD

## 2016-04-06 ENCOUNTER — Ambulatory Visit (HOSPITAL_BASED_OUTPATIENT_CLINIC_OR_DEPARTMENT_OTHER): Payer: BLUE CROSS/BLUE SHIELD | Admitting: Oncology

## 2016-04-06 VITALS — BP 125/74 | HR 100 | Temp 98.6°F | Resp 18 | Ht 66.0 in | Wt 153.3 lb

## 2016-04-06 DIAGNOSIS — C773 Secondary and unspecified malignant neoplasm of axilla and upper limb lymph nodes: Secondary | ICD-10-CM | POA: Diagnosis not present

## 2016-04-06 DIAGNOSIS — Z171 Estrogen receptor negative status [ER-]: Secondary | ICD-10-CM

## 2016-04-06 DIAGNOSIS — N951 Menopausal and female climacteric states: Secondary | ICD-10-CM

## 2016-04-06 DIAGNOSIS — C50212 Malignant neoplasm of upper-inner quadrant of left female breast: Secondary | ICD-10-CM

## 2016-04-06 DIAGNOSIS — Z170 Estrogen receptor positive status [ER+]: Principal | ICD-10-CM

## 2016-04-06 LAB — CBC WITH DIFFERENTIAL/PLATELET
BASO%: 0.6 % (ref 0.0–2.0)
BASOS ABS: 0 10*3/uL (ref 0.0–0.1)
EOS ABS: 0.1 10*3/uL (ref 0.0–0.5)
EOS%: 2.3 % (ref 0.0–7.0)
HCT: 36.6 % (ref 34.8–46.6)
HEMOGLOBIN: 12.2 g/dL (ref 11.6–15.9)
LYMPH%: 29.2 % (ref 14.0–49.7)
MCH: 31.3 pg (ref 25.1–34.0)
MCHC: 33.3 g/dL (ref 31.5–36.0)
MCV: 93.8 fL (ref 79.5–101.0)
MONO#: 0.5 10*3/uL (ref 0.1–0.9)
MONO%: 8.5 % (ref 0.0–14.0)
NEUT#: 3.2 10*3/uL (ref 1.5–6.5)
NEUT%: 59.4 % (ref 38.4–76.8)
Platelets: 210 10*3/uL (ref 145–400)
RBC: 3.9 10*6/uL (ref 3.70–5.45)
RDW: 12.9 % (ref 11.2–14.5)
WBC: 5.3 10*3/uL (ref 3.9–10.3)
lymph#: 1.6 10*3/uL (ref 0.9–3.3)

## 2016-04-06 LAB — COMPREHENSIVE METABOLIC PANEL
ALK PHOS: 56 U/L (ref 40–150)
ALT: 27 U/L (ref 0–55)
AST: 21 U/L (ref 5–34)
Albumin: 3.6 g/dL (ref 3.5–5.0)
Anion Gap: 9 mEq/L (ref 3–11)
BUN: 11.9 mg/dL (ref 7.0–26.0)
CHLORIDE: 106 meq/L (ref 98–109)
CO2: 26 mEq/L (ref 22–29)
Calcium: 8.7 mg/dL (ref 8.4–10.4)
Creatinine: 0.8 mg/dL (ref 0.6–1.1)
EGFR: 79 mL/min/{1.73_m2} — AB (ref >90)
GLUCOSE: 82 mg/dL (ref 70–140)
POTASSIUM: 4.6 meq/L (ref 3.5–5.1)
SODIUM: 141 meq/L (ref 136–145)
Total Bilirubin: 0.47 mg/dL (ref 0.20–1.20)
Total Protein: 6.7 g/dL (ref 6.4–8.3)

## 2016-04-06 NOTE — Progress Notes (Signed)
[facility]  [phone] [phone]) [phone]     ID: [name] [name] OB: January 27, 1961  MR#: [id number]  ZRA#:[id number]  PCP: [name] Pouch, DO GYN:  [name] [name] SU: [name] [name] OTHER MD: [name] [name]  CHIEF COMPLAINT: Locally advanced breast cancer   CURRENT TREATMENT:  tamoxifen  BREAST CANCER HISTORY: From the original intake note  "[name] [name] is 55 years old and she first noted a change, like a small hard pea in her left axilla. She brought this to Dr. [name] attention and bilateral diagnostic mammography and left ultrasonography at the breast Center for 07/18/2013 showed a small area of asymmetry in the inner left breast measuring perhaps 5 mm which was not palpable by physical exam. In the right breast there was some loosely grouped calcifications spanning up to 0.7 cm. Ultrasound of the left breast confirmed an ill-defined area of shadowing at the 9:00 position measuring up to 1.3 cm. There was also a 4 mm circumscribed hypoechoic mass at the 10:00 position. This is felt to be probably benign. In the left axilla there were 2 lymph nodes with slightly increased cortical thickening, although the fatty hila were maintained. These were not related to the patient's feeling of a "pea like mass" in her left axilla, which had by then pretty much resolved.   On 08/07/2013 the patient underwent left breast needle core biopsy and this showed ([id number]) and invasive lobular carcinoma (E-cadherin negative) estrogen receptor 70% positive with strong staining intensity, progesterone receptor 70% positive and strong staining intensity, with a proliferation marker of 7% and no HER-2 amplification, the signals ratio being 0.94 in the number per cell 1.50.  Bilateral breast MRI 08/15/2013 found an irregular spiculated mass in the left breast measuring approximately 1.5 cm. There was no abnormal enhancement of the pectoralis. There were at least 2 left axillary lymph nodes and  showed focal cortical thickening. There was a single left internal mammary lymph node at the level of the biopsy-proven malignancy which was not pathologic in appearance. The right breast and axilla were benign.  Genetics testing may 2015 was normal and did not reveal a mutation in the genes tested: ATM, BARD1, BRCA1, BRCA2, BRIP1, CDH1, CHEK2, MRE11A, MUTYH, NBN, NF1, PALB2, PTEN, RAD50, RAD51C, RAD51D, and TP53. She is status post left lumpectomy and axillary lymph node dissection by Dr [name] [name] 09/09/2013 for a pT1c pN2, stage IIIa invasive lobular carcinoma, grade 1, repeat HER-2 again negative. Patient had 8/23 LN positive and also evidence of LVI (lymphovascular invasion) and PNI (perineural invasion)."  Her subsequent history is as detailed below  INTERVAL HISTORY: [name] [name] returns today for follow-up of her estrogen receptor positive breast canceraccompanied by her husband [name] [name]. She continues on tamoxifen, with good tolerance. The hot flashes are considerably decreased. Vaginal wetness is not a problem and she obtains a drug at a very good price..   Biopsy of the suspicious area in her left breast 12/24/2015 showed no evidence of malignancy ([id number]).  REVIEW OF SYSTEMS: [name] [name] is now exercising regularly. A detailed review of systems today was otherwise entirely noncontributory  PAST MEDICAL HISTORY: Past Medical History:  Diagnosis Date  . Anxiety disorder    Mild: treated by her gynecologist with selective serotonin reuptake inhibitor  . Arthritis    hands and feet  . Family history of malignant neoplasm of breast   . Fibroid uterus    hysterctomy  . Hot flashes   . Hyperlipidemia   . Malignant neoplasm of breast (female), unspecified  site 08/07/13   left breast invasive mammary ca in situ  . Nonspecific abnormal electrocardiogram (ECG) (EKG) 2009  . Osteoarthritis   . Palpitations 2009   Improved; Secondary to premature ventricular contractions. No problems since-pt states  was caring for ailing father, anxiety  . Tick bite of right thigh 2016  . Wears glasses     PAST SURGICAL HISTORY: Past Surgical History:  Procedure Laterality Date  . ABDOMINAL HYSTERECTOMY  02/07/2011   Procedure: HYSTERECTOMY ABDOMINAL;  Surgeon: [name] [name];  Location: [facility];  Service: Gynecology;  Laterality: N/A;  . BREAST LUMPECTOMY WITH NEEDLE LOCALIZATION AND AXILLARY LYMPH NODE DISSECTION Left 09/09/2013   Procedure: LEFT BREAST LUMPECTOMY WITH NEEDLE LOCALIZATION AND LEFT AXILLARY LYMPH NODE DISSECTION;  Surgeon: [name], MD;  Location: [facility];  Service: General;  Laterality: Left;  . BREAST LUMPECTOMY WITH RADIOACTIVE SEED LOCALIZATION Right 10/07/2014   Procedure: RIGHT BREAST LUMPECTOMY WITH RADIOACTIVE SEED LOCALIZATION;  Surgeon: [name] Seltzer, MD;  Location: [facility];  Service: General;  Laterality: Right;  . BREAST LUMPECTOMY WITH RADIOACTIVE SEED LOCALIZATION Left 12/24/2015   Procedure: LEFT BREAST LUMPECTOMY WITH RADIOACTIVE SEED LOCALIZATION;  Surgeon: [name] Seltzer, MD;  Location: [facility];  Service: General;  Laterality: Left;  . CESAREAN SECTION    . DILATION AND CURETTAGE OF UTERUS    . ENDOMETRIAL ABLATION  2006  . LAPAROSCOPIC ASSISTED VAGINAL HYSTERECTOMY  02/07/2011   Procedure: LAPAROSCOPIC ASSISTED VAGINAL HYSTERECTOMY;  Surgeon: [name] [name];  Location: [facility];  Service: Gynecology;  Laterality: N/A;  . PORTACATH PLACEMENT Right 09/29/2013   Procedure: INSERTION PORT-A-CATH;  Surgeon: [name], MD;  Location: [facility] [facility];  Service: General;  Laterality: Right;  . THYROIDECTOMY, PARTIAL      FAMILY HISTORY Family History  Problem Relation Age of Onset  . Angina Father   . Aortic aneurysm Father   . Dementia Father   . CAD Father   . Hypertension Other   . Arrhythmia Other     Uncle- a fib  . Breast cancer Mother     dx 17s. Died at 49  . Non-Hodgkin's  lymphoma Mother   . Arrhythmia Maternal Aunt     A fib  . Breast cancer Maternal Aunt 56    currently 55  . Thyroid disease Maternal Aunt   . Arrhythmia Maternal Aunt     A fib  . Breast cancer Maternal Aunt     dx 24s; currently 38s  . Colon cancer Maternal Grandmother     dx 69s; died at 38  . Thyroid disease Maternal Grandmother   . Colon cancer Maternal Grandfather     dx 73s; died in 66s  . Cancer Paternal Uncle     unknown primary in early 46s  . Breast cancer Maternal Aunt     dx 68s; died in 57s  . Thyroid disease Maternal Uncle   The patient's father died from heart disease in the setting of dementia at the age of 42. The patient's mother died at the age of 45. She had been diagnosed with breast cancer at the age of 9. The patient is a single child. Her mother had 4 sisters, 3 of whom had breast cancer diagnosed at the age of 55, 36, and 59. There is also history of colorectal cancer on the maternal side of the family. BRCA testing negative.  GYNECOLOGIC HISTORY:   menarche age 27, first live birth age 17, the patient is  [place] P2. She underwent a hysterectomy without salpingo-oophorectomy in 2012. 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 Her husband [name] [name] also works for [employer], running [employer].. Evorn Gong lives in [place] where he works as a Public relations account executive. Daughter Darrick Penna this in [place], where she works as an Engineering geologist in a [employer]. The patient has no grandchildren. She is a Information systems manager.   ADVANCED DIRECTIVES: Not in place   HEALTH MAINTENANCE: Social History  Substance Use Topics  . Smoking status: Never Smoker  . Smokeless tobacco: Never Used  . Alcohol use 1.2 oz/week    2 Glasses of wine per week     Colonoscopy:2010   PAP:2012, s/p hysterectomy.  Bone density:2012   Lipid panel: 11/05/2013 TC 219, HDL 41, LDL  137, TG 204  No Known Allergies  Current Outpatient Prescriptions  Medication Sig Dispense Refill  . aspirin EC 81 MG tablet Take 1 tablet (81 mg total) by mouth daily.    . calcium carbonate (OSCAL) 1500 (600 Ca) MG TABS tablet Take by mouth 2 (two) times daily with a meal.    . Cholecalciferol (VITAMIN D) 2000 UNITS tablet Take 2,000 Units by mouth daily.    . diclofenac (VOLTAREN) 75 MG EC tablet Take 75 mg by mouth 2 (two) times daily as needed.    . doxycycline (VIBRA-TABS) 100 MG tablet Take 1 tablet (100 mg total) by mouth 2 (two) times daily. 20 tablet 0  . gabapentin (NEURONTIN) 300 MG capsule Take 1 capsule (300 mg total) by mouth at bedtime. 90 capsule 4  . tamoxifen (NOLVADEX) 20 MG tablet Take 1 tablet (20 mg total) by mouth daily. 90 tablet 12  . venlafaxine XR (EFFEXOR-XR) 37.5 MG 24 hr capsule Take 1 capsule (37.5 mg total) by mouth daily with breakfast. 30 capsule 5   No current facility-administered medications for this visit.     OBJECTIVE: middle-aged white woman who appears well  Vitals:   04/06/16 1215  BP: 125/74  Pulse: 100  Resp: 18  Temp: 98.6 F (37 C)     Body mass index is 24.74 kg/m.      ECOG FS:0 - Asymptomatic   Sclerae unicteric, pupils round and equal Oropharynx clear and moist-- no thrush or other lesions No cervical or supraclavicular adenopathy Lungs no rales or rhonchi Heart regular rate and rhythm Abd soft, nontender, positive bowel sounds MSK no focal spinal tenderness, no upper extremity lymphedema Neuro: nonfocal, well oriented, appropriate affect Breasts: The right breast is benign. The left breast is status post lumpectomy and radiation. There is no evidence of local recurrence. The left axilla is benign.   LAB RESULTS:  CMP     Component Value Date/Time   NA 141 04/06/2016 1159   K 4.6 04/06/2016 1159   CL 104 11/12/2015 0820   CO2 26 04/06/2016 1159   GLUCOSE 82 04/06/2016 1159   BUN 11.9 04/06/2016 1159   CREATININE  0.8 04/06/2016 1159   CALCIUM 8.7 04/06/2016 1159   PROT 6.7 04/06/2016 1159   ALBUMIN 3.6 04/06/2016 1159   AST 21 04/06/2016 1159   ALT 27 04/06/2016 1159   ALKPHOS 56 04/06/2016 1159   BILITOT 0.47 04/06/2016 1159   I No results found for: SPEP  Lab Results  Component Value Date   WBC 5.3 04/06/2016   NEUTROABS 3.2 04/06/2016   HGB  12.2 04/06/2016   HCT 36.6 04/06/2016   MCV 93.8 04/06/2016   PLT 210 04/06/2016      Chemistry      Component Value Date/Time   NA 141 04/06/2016 1159   K 4.6 04/06/2016 1159   CL 104 11/12/2015 0820   CO2 26 04/06/2016 1159   BUN 11.9 04/06/2016 1159   CREATININE 0.8 04/06/2016 1159      Component Value Date/Time   CALCIUM 8.7 04/06/2016 1159   ALKPHOS 56 04/06/2016 1159   AST 21 04/06/2016 1159   ALT 27 04/06/2016 1159   BILITOT 0.47 04/06/2016 1159      STUDIES: No results found.   ASSESSMENT: 55 y.o. BRCA negative [place] woman status post left breastUpper inner quadrant biopsy for 01/18/2014 showing invasive lobular carcinoma (E-cadherin negative), estrogen receptor 70% positive, progesterone receptor 70% positive, with an MIB-1 of 7%, and no HER-2 amplification  (1) ultrasound-guided biopsy of a suspicious lymph node in the left axilla 08/19/2012 was positive  (2) status post left lumpectomy and axillary lymph node dissection 09/09/2013 for a pT1c pN2, stage IIIa invasive lobular carcinoma, grade 1, repeat HER-2 again negative. [She had 8 of 23 axillary lymph nodes involved]  (4) Adjuvant chemotherapy consisting of doxorubicin and cyclophosphamide in dose dense fashion x4 completed 12/18/2013;followed by paclitaxel weekly x 12 completed 03/20/2014  (5) radiation completed in [facility] 07/01/2014  (6) started tamoxifen 08/01/2014  (a) the patient is status post simple hysterectomy, no salpingo-oophorectomy  (b) DEXA scan at [facility] internal medicine 06/30/2014 showed a T score of -1.0 (normal)  (7) hot flashes - gabapentin  360m nightly, to begin venlafaxine daily.   PLAN: NIzora Galais now 2-1/2 years out from definitive surgery for her breast cancer with no evidence of disease recurrence. This is very favorable.  She is tolerating the tamoxifen well and by the time she returns to see me in June of next year, after her next mammogram, she will have 2 years of tamoxifen under her belt and can consider switching to an aromatase inhibitor. Note that she had a normal bone density scan in March 2016  Overall the plan is to continue on anti-estrogens for a total of 5 years. She knows to call for any problems that may develop before her next visit.   MChauncey Cruel MD 04/06/2016

## 2016-04-07 ENCOUNTER — Ambulatory Visit: Payer: BLUE CROSS/BLUE SHIELD | Admitting: Oncology

## 2016-04-07 ENCOUNTER — Other Ambulatory Visit: Payer: BLUE CROSS/BLUE SHIELD

## 2016-04-16 ENCOUNTER — Other Ambulatory Visit: Payer: Self-pay | Admitting: Oncology

## 2016-04-16 DIAGNOSIS — C50212 Malignant neoplasm of upper-inner quadrant of left female breast: Secondary | ICD-10-CM

## 2016-04-26 ENCOUNTER — Other Ambulatory Visit: Payer: Self-pay | Admitting: Nurse Practitioner

## 2016-05-04 IMAGING — MG MM BREAST BX W/ LOC DEV 1ST LESION IMAGE BX SPEC STEREO GUIDE*R*
8 series · 8 of 16 positions shown · non-contrast
Comparison: Previous exams.

ADDENDUM:
Pathology revealed a sclerosing lesion with fibrocystic changes and
usual ductal hyperplasia in the right breast, and the differential
includes a complex sclerosing lesion versus a radial scar. This was
found to be concordant by Dr. Kimberly Joyce Amacna. Pathology was discussed
with the patient by telephone. She reported doing well after the
biopsy with tenderness at the site. Post biopsy instructions and
care were reviewed and her questions were answered. Surgical
consultation has been arranged with Dr. Christian Damian Ajuech at [[email or web address]] on September 24, 2014. My number was provided
for additional questions and concerns.

Pathology results reported by Yamil Ta RN, BSN on September 10, 2014.
CLINICAL DATA: The patient has a history of left breast invasive
lobular carcinoma pT1c pN2 stage IIIa. She has undergone
chemotherapy, radiation therapy and is on antiestrogen therapy. She
has been found to have an area of distortion within her right breast
on tomosynthesis. Biopsy requested.
EXAM:
RIGHT BREAST STEREOTACTIC CORE NEEDLE BIOPSY

[R CC (1 of 4)]
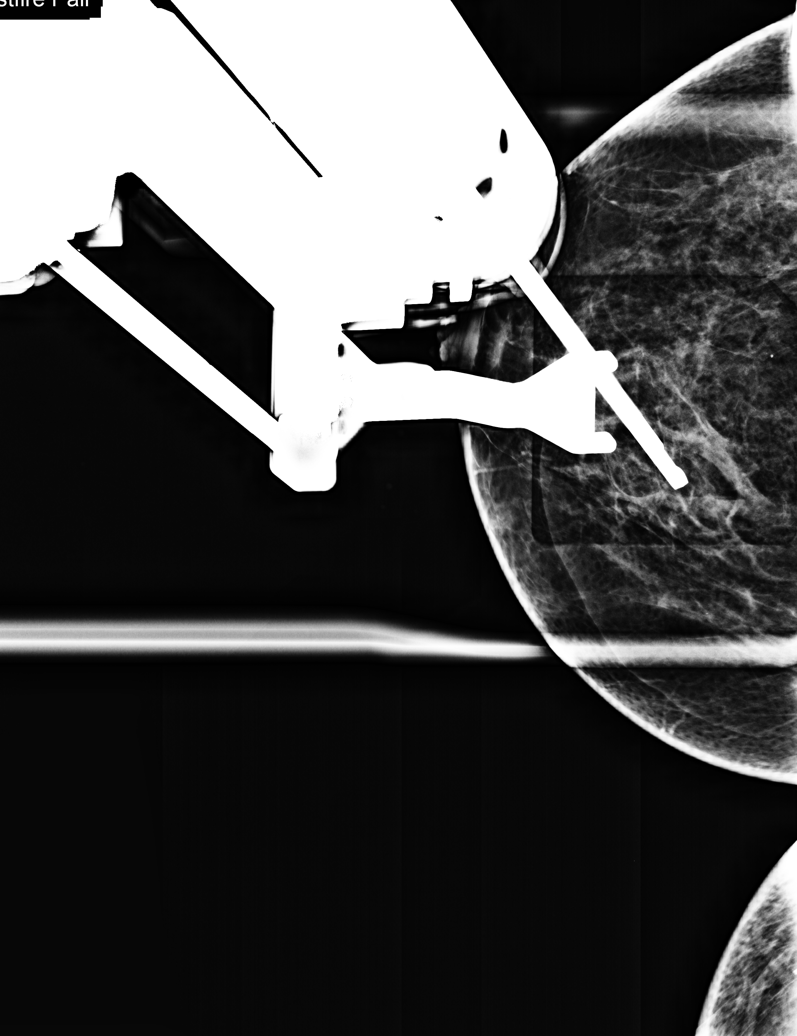

[R CC (2 of 4)]
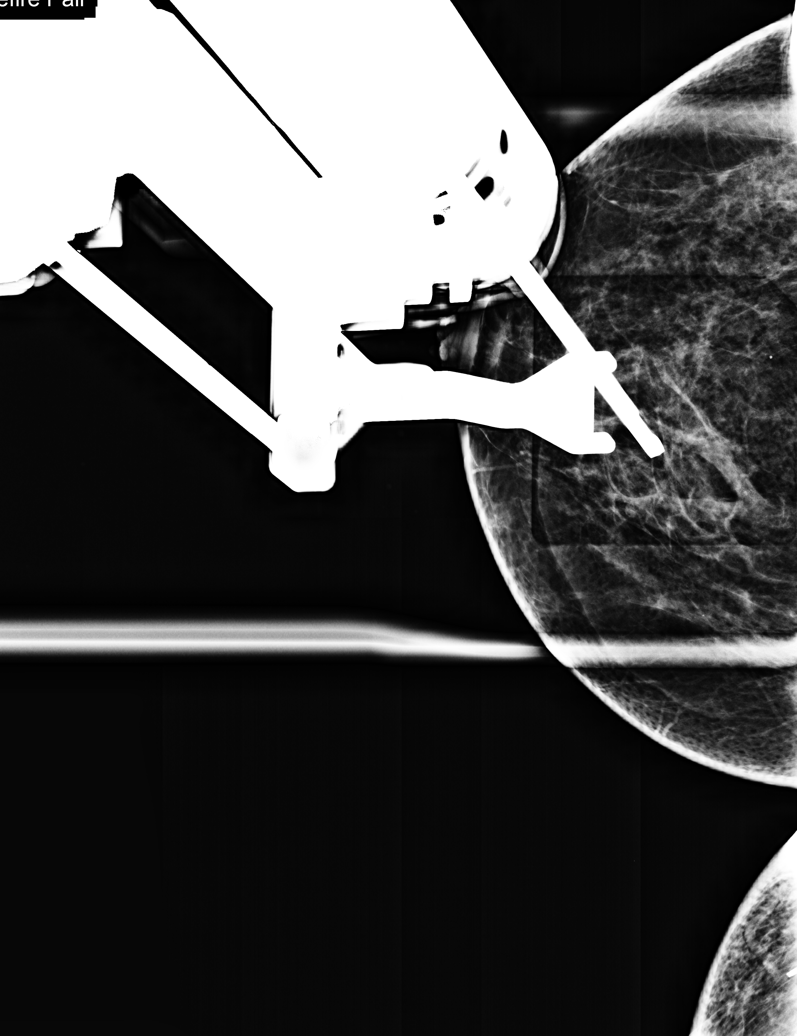

[R CC (3 of 4)]
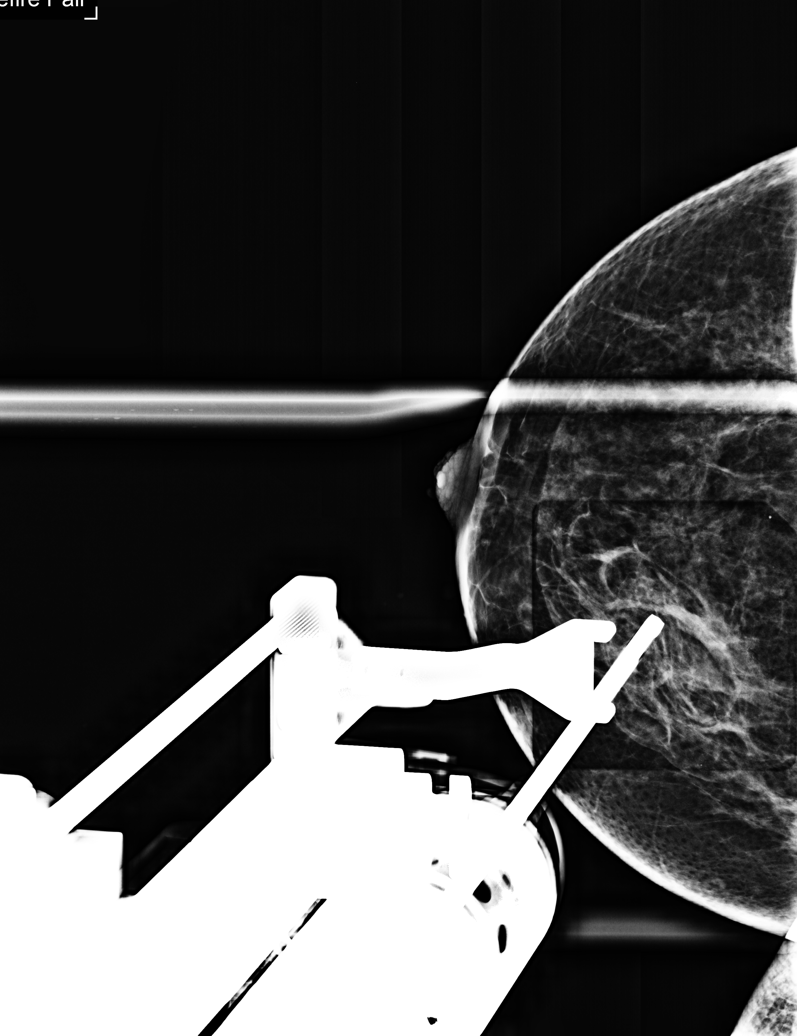

[R CC (4 of 4)]
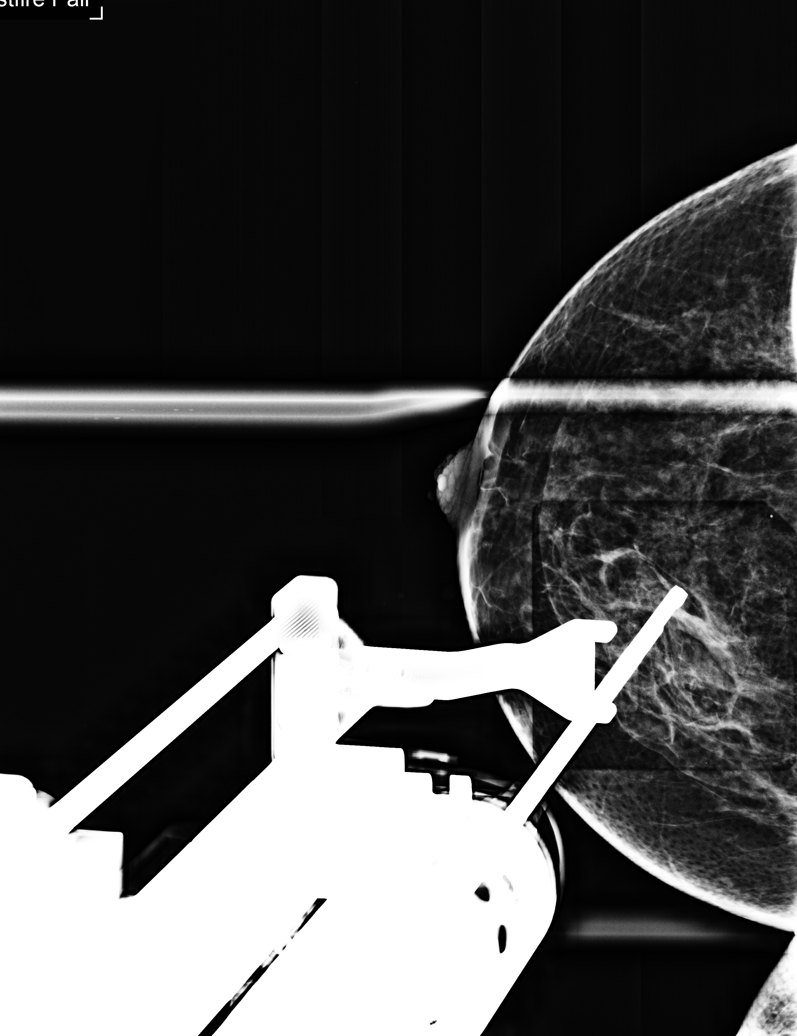

[R CC tomo (1 of 4)]
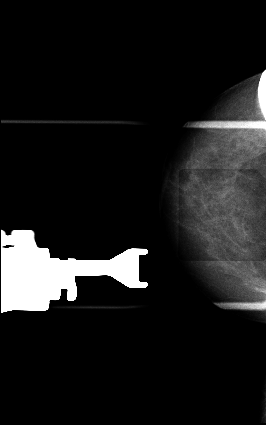

[R CC tomo (2 of 4)]
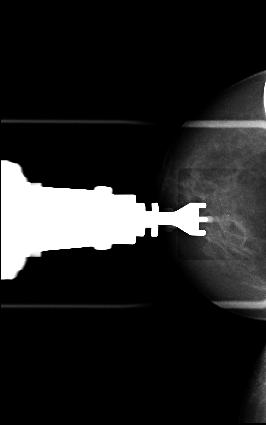

[R CC tomo (3 of 4) · tomo slice 35/68.0]
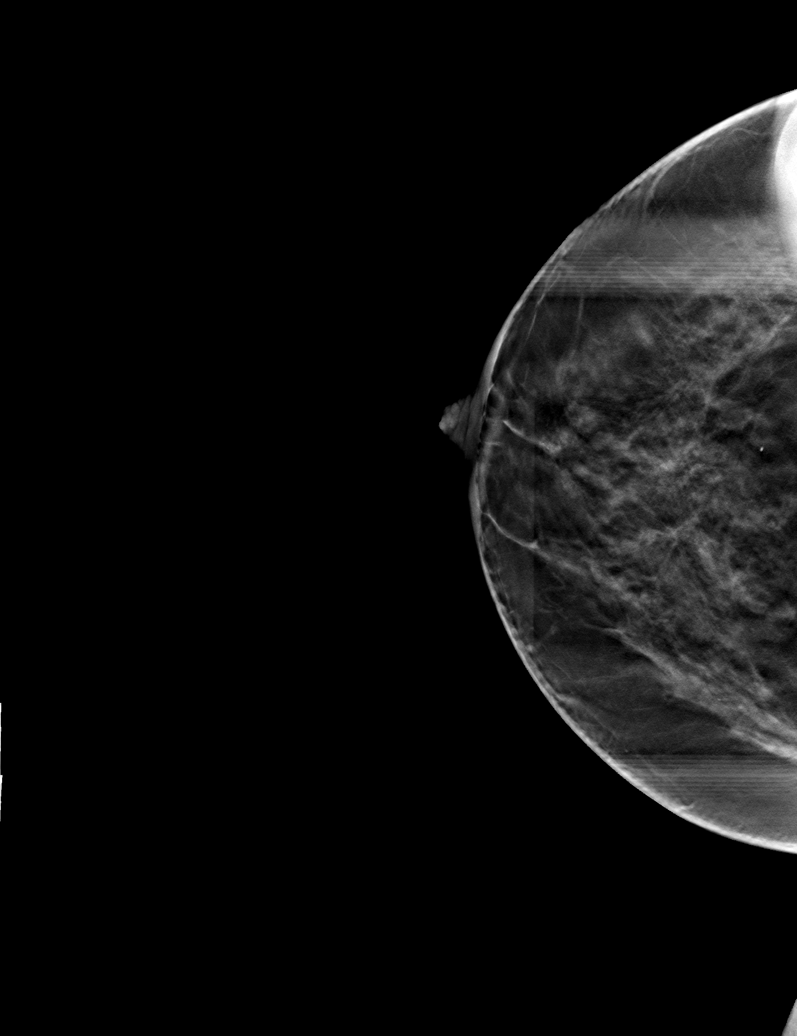

[R CC tomo (4 of 4) · tomo slice 34/67.0]
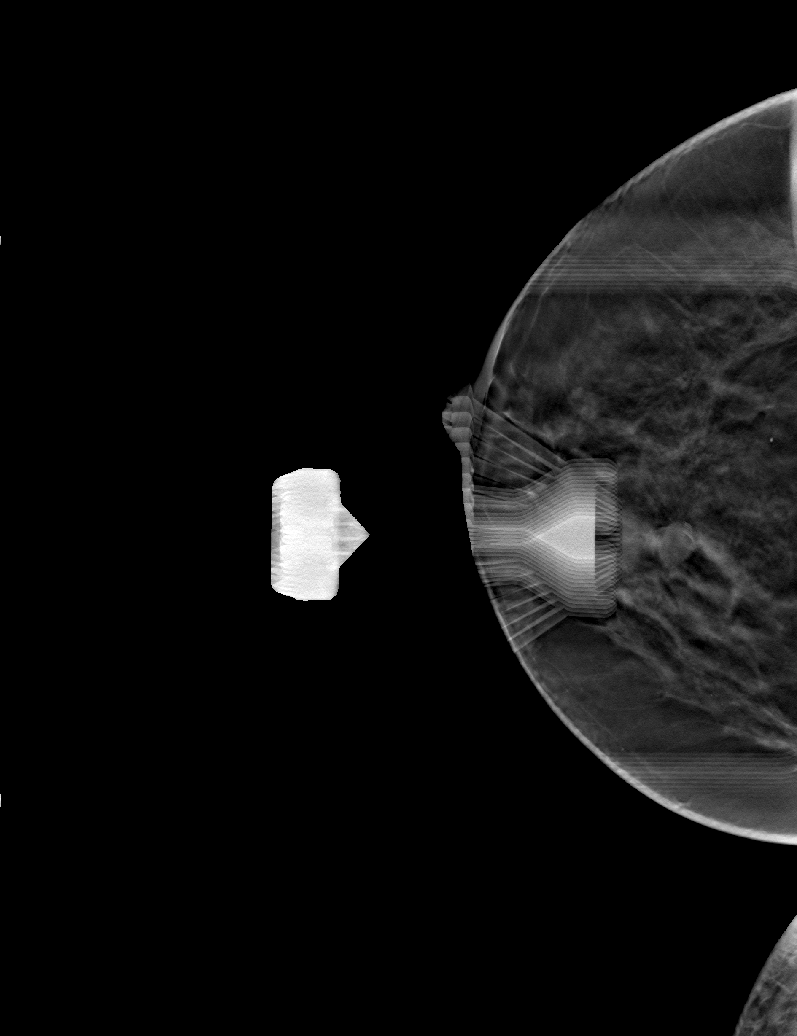

[8 of 16 positions shown; findings below may reference images not displayed]



Using sterile technique and 2% lidocaine as local anesthetic, under
stereotactic guidance, a 9 gauge vacuum assisted device was used to
perform core needle biopsy of the small area of distortion located
within the upper inner quadrant of the right breast using a superior
craniocaudal approach.

At the conclusion of the procedure, a X shaped tissue marker clip
was deployed into the biopsy cavity. Follow-up 2-view mammogram was
performed and dictated separately.
IMPRESSION: Stereotactic-guided biopsy of the small area of distortion located
within the upper inner quadrant of the right breast as discussed
above. No apparent complications.

## 2016-05-10 ENCOUNTER — Other Ambulatory Visit: Payer: Self-pay | Admitting: Oncology

## 2016-06-21 ENCOUNTER — Other Ambulatory Visit: Payer: Self-pay | Admitting: Oncology

## 2016-06-21 DIAGNOSIS — Z853 Personal history of malignant neoplasm of breast: Secondary | ICD-10-CM

## 2016-06-22 ENCOUNTER — Encounter: Payer: Self-pay | Admitting: Family Medicine

## 2016-07-18 ENCOUNTER — Telehealth: Payer: Self-pay

## 2016-07-18 MED ORDER — TAMOXIFEN CITRATE 20 MG PO TABS: 20.0000 mg | ORAL_TABLET | Freq: Every day | ORAL | 1 refills | Status: DC

## 2016-07-18 NOTE — Telephone Encounter (Signed)
Incoming fax for tamoxifen refill. Done per protocol

## 2016-08-07 ENCOUNTER — Other Ambulatory Visit: Payer: Self-pay | Admitting: *Deleted

## 2016-08-07 MED ORDER — GABAPENTIN 300 MG PO CAPS: 300.0000 mg | ORAL_CAPSULE | Freq: Every day | ORAL | 1 refills | Status: DC

## 2016-09-04 ENCOUNTER — Encounter: Payer: Self-pay | Admitting: Family Medicine

## 2016-09-18 ENCOUNTER — Ambulatory Visit
Admission: RE | Admit: 2016-09-18 | Discharge: 2016-09-18 | Disposition: A | Discharge status: Home / Self Care | Payer: BLUE CROSS/BLUE SHIELD | Source: Ambulatory Visit | Attending: Oncology | Admitting: Oncology

## 2016-09-18 DIAGNOSIS — Z853 Personal history of malignant neoplasm of breast: Secondary | ICD-10-CM

## 2016-09-18 HISTORY — DX: Personal history of irradiation: Z92.3

## 2016-09-18 HISTORY — DX: Malignant neoplasm of unspecified site of unspecified female breast: C50.919

## 2016-09-21 ENCOUNTER — Ambulatory Visit (INDEPENDENT_AMBULATORY_CARE_PROVIDER_SITE_OTHER): Payer: BLUE CROSS/BLUE SHIELD | Admitting: Family Medicine

## 2016-09-21 ENCOUNTER — Encounter: Payer: Self-pay | Admitting: Family Medicine

## 2016-09-21 ENCOUNTER — Ambulatory Visit: Payer: Self-pay | Admitting: Family Medicine

## 2016-09-21 VITALS — BP 126/81 | HR 67 | Temp 97.7°F | Resp 20 | Ht 66.0 in | Wt 156.0 lb

## 2016-09-21 DIAGNOSIS — Z6825 Body mass index (BMI) 25.0-25.9, adult: Secondary | ICD-10-CM

## 2016-09-21 DIAGNOSIS — R400 Somnolence: Secondary | ICD-10-CM

## 2016-09-21 DIAGNOSIS — R0681 Apnea, not elsewhere classified: Secondary | ICD-10-CM | POA: Diagnosis not present

## 2016-09-21 DIAGNOSIS — R0683 Snoring: Secondary | ICD-10-CM

## 2016-09-21 DIAGNOSIS — E0789 Other specified disorders of thyroid: Secondary | ICD-10-CM

## 2016-09-21 NOTE — Patient Instructions (Signed)

## 2016-09-21 NOTE — Progress Notes (Signed)
[name] [name] , 11-Jun-1960, 56 y.o., female MRN: [id number] Patient Care Team    Relationship Specialty Notifications Start End  Ma Hillock, DO PCP - General Family Medicine  07/16/15   Excell Seltzer, MD Consulting Physician General Surgery  08/10/15   Magrinat, Virgie Dad, MD Consulting Physician Oncology  08/10/15   [name], MD Consulting Physician Obstetrics and Gynecology  08/10/15     Chief Complaint  Patient presents with  . Snoring    fatigue     Subjective: Pt presents for an OV with complaints of Snoring of 2 months duration. She is present with her husband today who also provides history. Her husband then confirms that his wife's snoring has increased over the last 2 months. He has witnessed her having moments of gasping, that seem to cycle every few minutes. She endorses daytime somnolence, multiple awakenings throughout the night secondary to waking herself from gasping. Her weight is up about 7 pounds in the last year, but he still in the range that she has fluctuated over multiple years. He is not treated for hypertension. Her BMI is 25.18 today.  Patient has a significant medical history of partial thyroidectomy, last TSH January 2017. She has not needed medications for this issue. She also has a is a breast cancer survivor, on tamoxifen. She is prescribed gabapentin 300 mg at night through her oncologist and Effexor 37.5 mg daily in the morning.  Depression screen [facility] 2/9 11/12/2015 08/10/2015 03/23/2014  Decreased Interest 0 0 0  Down, Depressed, Hopeless 0 0 0  PHQ - 2 Score 0 0 0    No Known Allergies Social History  Substance Use Topics  . Smoking status: Never Smoker  . Smokeless tobacco: Never Used  . Alcohol use 1.2 oz/week    2 Glasses of wine per week   Past Medical History:  Diagnosis Date  . Anxiety disorder    Mild: treated by her gynecologist with selective serotonin reuptake inhibitor  . Arthritis    hands and feet  . Breast cancer  ([place]) 09/09/2013  . Family history of malignant neoplasm of breast   . Fibroid uterus    hysterctomy  . Hot flashes   . Hyperlipidemia   . Malignant neoplasm of breast (female), unspecified site 08/07/13   left breast invasive mammary ca in situ  . Nonspecific abnormal electrocardiogram (ECG) (EKG) 2009  . Osteoarthritis   . Palpitations 2009   Improved; Secondary to premature ventricular contractions. No problems since-pt states was caring for ailing father, anxiety  . Personal history of radiation therapy    left breast 2015  . Tick bite of right thigh 2016  . Wears glasses    Past Surgical History:  Procedure Laterality Date  . ABDOMINAL HYSTERECTOMY  02/07/2011   Procedure: HYSTERECTOMY ABDOMINAL;  Surgeon: [name];  Location: [facility];  Service: Gynecology;  Laterality: N/A;  . BREAST EXCISIONAL BIOPSY Right 10/07/2014   high risk  . BREAST EXCISIONAL BIOPSY Left 12/24/2015   benign  . BREAST LUMPECTOMY Left 09/09/2013   left breast 2015  . BREAST LUMPECTOMY WITH NEEDLE LOCALIZATION AND AXILLARY LYMPH NODE DISSECTION Left 09/09/2013   Procedure: LEFT BREAST LUMPECTOMY WITH NEEDLE LOCALIZATION AND LEFT AXILLARY LYMPH NODE DISSECTION;  Surgeon: [name], MD;  Location: [place];  Service: General;  Laterality: Left;  . BREAST LUMPECTOMY WITH RADIOACTIVE SEED LOCALIZATION Right 10/07/2014   Procedure: RIGHT BREAST LUMPECTOMY WITH RADIOACTIVE SEED LOCALIZATION;  Surgeon: Excell Seltzer, MD;  Location: [place];  Service: General;  Laterality: Right;  . BREAST LUMPECTOMY WITH RADIOACTIVE SEED LOCALIZATION Left 12/24/2015   Procedure: LEFT BREAST LUMPECTOMY WITH RADIOACTIVE SEED LOCALIZATION;  Surgeon: Excell Seltzer, MD;  Location: [place];  Service: General;  Laterality: Left;  . CESAREAN SECTION    . DILATION AND CURETTAGE OF UTERUS    . ENDOMETRIAL ABLATION  2006  . LAPAROSCOPIC ASSISTED VAGINAL HYSTERECTOMY   02/07/2011   Procedure: LAPAROSCOPIC ASSISTED VAGINAL HYSTERECTOMY;  Surgeon: [name];  Location: [facility];  Service: Gynecology;  Laterality: N/A;  . PORTACATH PLACEMENT Right 09/29/2013   Procedure: INSERTION PORT-A-CATH;  Surgeon: [name], MD;  Location: [place];  Service: General;  Laterality: Right;  . THYROIDECTOMY, PARTIAL     Family History  Problem Relation Age of Onset  . Angina Father   . Aortic aneurysm Father   . Dementia Father   . CAD Father   . Hypertension Other   . Arrhythmia Other        Uncle- a fib  . Breast cancer Mother        dx 39s. Died at 51  . Non-Hodgkin's lymphoma Mother   . Arrhythmia Maternal Aunt        A fib  . Breast cancer Maternal Aunt 56       currently 41  . Thyroid disease Maternal Aunt   . Arrhythmia Maternal Aunt        A fib  . Breast cancer Maternal Aunt        dx 33s; currently 31s  . Colon cancer Maternal Grandmother        dx 65s; died at 58  . Thyroid disease Maternal Grandmother   . Colon cancer Maternal Grandfather        dx 60s; died in 65s  . Cancer Paternal Uncle        unknown primary in early 83s  . Breast cancer Maternal Aunt        dx 66s; died in 59s  . Thyroid disease Maternal Uncle    Allergies as of 09/21/2016   No Known Allergies     Medication List       Accurate as of 09/21/16  8:54 AM. Always use your most recent med list.          aspirin EC 81 MG tablet Take 1 tablet (81 mg total) by mouth daily.   calcium carbonate 1500 (600 Ca) MG Tabs tablet Commonly known as:  OSCAL Take by mouth 2 (two) times daily with a meal.   diclofenac 75 MG EC tablet Commonly known as:  VOLTAREN Take 75 mg by mouth 2 (two) times daily as needed.   gabapentin 300 MG capsule Commonly known as:  NEURONTIN Take 1 capsule (300 mg total) by mouth at bedtime.   tamoxifen 20 MG tablet Commonly known as:  NOLVADEX Take 1 tablet (20 mg total) by mouth daily.   venlafaxine XR 37.5 MG  24 hr capsule Commonly known as:  EFFEXOR-XR TAKE 1 CAPSULE EVERY MORNING WITH BREAKFAST   Vitamin D 2000 units tablet Take 2,000 Units by mouth daily.       All past medical history, surgical history, allergies, family history, immunizations andmedications were updated in the EMR today and reviewed under the history and medication portions of their EMR.     ROS: Negative, with the exception of above mentioned in HPI   Objective:  BP 126/81 (BP Location: Right  Arm, Patient Position: Sitting, Cuff Size: Normal)   Pulse 67   Temp 97.7 F (36.5 C)   Resp 20   Ht 5\' 6"  (1.676 m)   Wt 156 lb (70.8 kg)   SpO2 98%   BMI 25.18 kg/m  Body mass index is 25.18 kg/m. Gen: Afebrile. No acute distress. Nontoxic in appearance, well developed, well nourished. Very pleasant caucasian female.  HENT: AT. [place]. MMM, no oral lesions. Bilateral nares without erythema, swelling. Nasal septum midline.  Throat without erythema or exudates. Tonsils normal, Without enlargement. Eyes:Pupils Equal Round Reactive to light, Extraocular movements intact,  Conjunctiva without redness, discharge or icterus. Neck/lymp/endocrine: Supple, No lymphadenopathy, mild right thyroid fullness (chronic) CV: RRR, no edema Chest: CTAB, no wheeze or crackles. Good air movement, normal resp effort.  Abd: Soft. NTND. BS present.   Neuro: Normal gait. PERLA. EOMi. Alert. Oriented x3  Psych: Normal affect, dress and demeanor. Normal speech. Normal thought content and judgment.  No exam data present No results found. No results found for this or any previous visit (from the past 24 hour(s)).  Assessment/Plan: [name] [name] is a 56 y.o. female present for OV for  Snoring Witnessed apneic spells Daytime somnolence BMI 25.0-25.9,adult - Sleep apnea screen tool: Intermediate risk.  - TSH ordered today - discussed sleep hygiene, sleep apnea, other potential causes.  - Pt is agreeable to pulm referral and likely eventual  sleep study.  - Ambulatory referral to Pulmonology   Reviewed expectations re: course of current medical issues.  Discussed self-management of symptoms.  Outlined signs and symptoms indicating need for more acute intervention.  Patient verbalized understanding and all questions were answered.  Patient received an After-Visit Summary.   Note is dictated utilizing voice recognition software. Although note has been proof read prior to signing, occasional typographical errors still can be missed. If any questions arise, please do not hesitate to call for verification.   electronically signed by:  Howard Pouch, DO  [place]

## 2016-10-25 ENCOUNTER — Other Ambulatory Visit: Payer: Self-pay | Admitting: *Deleted

## 2016-10-25 DIAGNOSIS — C50212 Malignant neoplasm of upper-inner quadrant of left female breast: Secondary | ICD-10-CM

## 2016-10-25 DIAGNOSIS — Z170 Estrogen receptor positive status [ER+]: Principal | ICD-10-CM

## 2016-10-26 ENCOUNTER — Other Ambulatory Visit (HOSPITAL_BASED_OUTPATIENT_CLINIC_OR_DEPARTMENT_OTHER): Payer: BLUE CROSS/BLUE SHIELD

## 2016-10-26 ENCOUNTER — Ambulatory Visit (HOSPITAL_BASED_OUTPATIENT_CLINIC_OR_DEPARTMENT_OTHER): Payer: BLUE CROSS/BLUE SHIELD | Admitting: Oncology

## 2016-10-26 VITALS — BP 138/77 | HR 96 | Temp 98.1°F | Resp 18 | Ht 66.0 in | Wt 157.1 lb

## 2016-10-26 DIAGNOSIS — N951 Menopausal and female climacteric states: Secondary | ICD-10-CM | POA: Diagnosis not present

## 2016-10-26 DIAGNOSIS — Z170 Estrogen receptor positive status [ER+]: Secondary | ICD-10-CM

## 2016-10-26 DIAGNOSIS — C50212 Malignant neoplasm of upper-inner quadrant of left female breast: Secondary | ICD-10-CM | POA: Diagnosis not present

## 2016-10-26 DIAGNOSIS — Z79810 Long term (current) use of selective estrogen receptor modulators (SERMs): Secondary | ICD-10-CM | POA: Diagnosis not present

## 2016-10-26 LAB — COMPREHENSIVE METABOLIC PANEL
ALBUMIN: 3.7 g/dL (ref 3.5–5.0)
ALK PHOS: 72 U/L (ref 40–150)
ALT: 26 U/L (ref 0–55)
AST: 20 U/L (ref 5–34)
Anion Gap: 10 mEq/L (ref 3–11)
BILIRUBIN TOTAL: 0.28 mg/dL (ref 0.20–1.20)
BUN: 13.2 mg/dL (ref 7.0–26.0)
CO2: 24 mEq/L (ref 22–29)
CREATININE: 0.9 mg/dL (ref 0.6–1.1)
Calcium: 9.1 mg/dL (ref 8.4–10.4)
Chloride: 108 mEq/L (ref 98–109)
EGFR: 74 mL/min/{1.73_m2} — ABNORMAL LOW (ref >90)
GLUCOSE: 122 mg/dL (ref 70–140)
POTASSIUM: 3.8 meq/L (ref 3.5–5.1)
SODIUM: 142 meq/L (ref 136–145)
TOTAL PROTEIN: 6.8 g/dL (ref 6.4–8.3)

## 2016-10-26 LAB — DRAW EXTRA CLOT TUBE

## 2016-10-26 LAB — CBC WITH DIFFERENTIAL/PLATELET
BASO%: 0.6 % (ref 0.0–2.0)
Basophils Absolute: 0 10*3/uL (ref 0.0–0.1)
EOS%: 2.9 % (ref 0.0–7.0)
Eosinophils Absolute: 0.2 10*3/uL (ref 0.0–0.5)
HEMATOCRIT: 38.2 % (ref 34.8–46.6)
HGB: 13.1 g/dL (ref 11.6–15.9)
LYMPH#: 1.4 10*3/uL (ref 0.9–3.3)
LYMPH%: 25.4 % (ref 14.0–49.7)
MCH: 31.9 pg (ref 25.1–34.0)
MCHC: 34.2 g/dL (ref 31.5–36.0)
MCV: 93.2 fL (ref 79.5–101.0)
MONO#: 0.4 10*3/uL (ref 0.1–0.9)
MONO%: 7.1 % (ref 0.0–14.0)
NEUT%: 64 % (ref 38.4–76.8)
NEUTROS ABS: 3.5 10*3/uL (ref 1.5–6.5)
PLATELETS: 223 10*3/uL (ref 145–400)
RBC: 4.1 10*6/uL (ref 3.70–5.45)
RDW: 12.6 % (ref 11.2–14.5)
WBC: 5.4 10*3/uL (ref 3.9–10.3)

## 2016-10-26 MED ORDER — TAMOXIFEN CITRATE 20 MG PO TABS: 20.0000 mg | ORAL_TABLET | Freq: Every day | ORAL | 1 refills | Status: DC

## 2016-10-26 MED ORDER — VENLAFAXINE HCL 37.5 MG PO TABS: ORAL_TABLET | ORAL | 0 refills | Status: DC

## 2016-10-26 NOTE — Progress Notes (Signed)
[place]  [phone] [phone]) [phone]     ID: Lisabeth Pick OB: 1960-07-09  MR#: [id number]  BTY#:[id number]  PCP: Ma Hillock, DO GYN:  [name] SU: Excell Seltzer OTHER MD: [name]  CHIEF COMPLAINT: Locally advanced breast cancer   CURRENT TREATMENT:  tamoxifen  BREAST CANCER HISTORY: From the original intake note  "Kanyon is 56 years old and she first noted a change, like a small hard pea in her left axilla. She brought this to Dr. [name] attention and bilateral diagnostic mammography and left ultrasonography at the breast Center for 07/18/2013 showed a small area of asymmetry in the inner left breast measuring perhaps 5 mm which was not palpable by physical exam. In the right breast there was some loosely grouped calcifications spanning up to 0.7 cm. Ultrasound of the left breast confirmed an ill-defined area of shadowing at the 9:00 position measuring up to 1.3 cm. There was also a 4 mm circumscribed hypoechoic mass at the 10:00 position. This is felt to be probably benign. In the left axilla there were 2 lymph nodes with slightly increased cortical thickening, although the fatty hila were maintained. These were not related to the patient's feeling of a "pea like mass" in her left axilla, which had by then pretty much resolved.   On 08/07/2013 the patient underwent left breast needle core biopsy and this showed ([id number]) and invasive lobular carcinoma (E-cadherin negative) estrogen receptor 70% positive with strong staining intensity, progesterone receptor 70% positive and strong staining intensity, with a proliferation marker of 7% and no HER-2 amplification, the signals ratio being 0.94 in the number per cell 1.50.  Bilateral breast MRI 08/15/2013 found an irregular spiculated mass in the left breast measuring approximately 1.5 cm. There was no abnormal enhancement of the pectoralis. There were at least 2 left axillary lymph nodes and  showed focal cortical thickening. There was a single left internal mammary lymph node at the level of the biopsy-proven malignancy which was not pathologic in appearance. The right breast and axilla were benign.  Genetics testing may 2015 was normal and did not reveal a mutation in the genes tested: ATM, BARD1, BRCA1, BRCA2, BRIP1, CDH1, CHEK2, MRE11A, MUTYH, NBN, NF1, PALB2, PTEN, RAD50, RAD51C, RAD51D, and TP53. She is status post left lumpectomy and axillary lymph node dissection by Dr Excell Seltzer 09/09/2013 for a pT1c pN2, stage IIIa invasive lobular carcinoma, grade 1, repeat HER-2 again negative. Patient had 8/23 LN positive and also evidence of LVI (lymphovascular invasion) and PNI (perineural invasion)."  Her subsequent history is as detailed below  INTERVAL HISTORY: [name] [name] returns today for follow-up and treatment of her estrogen receptor positive breast cancer. She continues on tamoxifen. She is doing "good" with that medication. Hot flashes have nearly resolved. Vaginal wetness is not an issue.  The big news is that her husband [name] just retired. She is looking forward to doing the same in a few years.  REVIEW OF SYSTEMS: She exercises by walking and gardening. She feels well enough that she would like to try to get off some of her supportive medications. A detailed review of systems today was otherwise stable  PAST MEDICAL HISTORY: Past Medical History:  Diagnosis Date  . Anxiety disorder    Mild: treated by her gynecologist with selective serotonin reuptake inhibitor  . Arthritis    hands and feet  . Breast cancer ([place]) 09/09/2013  . Family history of malignant neoplasm of breast   . Fibroid uterus  hysterctomy  . Hot flashes   . Hyperlipidemia   . Malignant neoplasm of breast (female), unspecified site 08/07/13   left breast invasive mammary ca in situ  . Nonspecific abnormal electrocardiogram (ECG) (EKG) 2009  . Osteoarthritis   . Palpitations 2009   Improved; Secondary to  premature ventricular contractions. No problems since-pt states was caring for ailing father, anxiety  . Personal history of radiation therapy    left breast 2015  . Tick bite of right thigh 2016  . Wears glasses     PAST SURGICAL HISTORY: Past Surgical History:  Procedure Laterality Date  . ABDOMINAL HYSTERECTOMY  02/07/2011   Procedure: HYSTERECTOMY ABDOMINAL;  Surgeon: [name];  Location: [facility];  Service: Gynecology;  Laterality: N/A;  . BREAST EXCISIONAL BIOPSY Right 10/07/2014   high risk  . BREAST EXCISIONAL BIOPSY Left 12/24/2015   benign  . BREAST LUMPECTOMY Left 09/09/2013   left breast 2015  . BREAST LUMPECTOMY WITH NEEDLE LOCALIZATION AND AXILLARY LYMPH NODE DISSECTION Left 09/09/2013   Procedure: LEFT BREAST LUMPECTOMY WITH NEEDLE LOCALIZATION AND LEFT AXILLARY LYMPH NODE DISSECTION;  Surgeon: [name], MD;  Location: [place];  Service: General;  Laterality: Left;  . BREAST LUMPECTOMY WITH RADIOACTIVE SEED LOCALIZATION Right 10/07/2014   Procedure: RIGHT BREAST LUMPECTOMY WITH RADIOACTIVE SEED LOCALIZATION;  Surgeon: Excell Seltzer, MD;  Location: [place];  Service: General;  Laterality: Right;  . BREAST LUMPECTOMY WITH RADIOACTIVE SEED LOCALIZATION Left 12/24/2015   Procedure: LEFT BREAST LUMPECTOMY WITH RADIOACTIVE SEED LOCALIZATION;  Surgeon: Excell Seltzer, MD;  Location: [place];  Service: General;  Laterality: Left;  . CESAREAN SECTION    . DILATION AND CURETTAGE OF UTERUS    . ENDOMETRIAL ABLATION  2006  . LAPAROSCOPIC ASSISTED VAGINAL HYSTERECTOMY  02/07/2011   Procedure: LAPAROSCOPIC ASSISTED VAGINAL HYSTERECTOMY;  Surgeon: [name];  Location: [facility];  Service: Gynecology;  Laterality: N/A;  . PORTACATH PLACEMENT Right 09/29/2013   Procedure: INSERTION PORT-A-CATH;  Surgeon: [name], MD;  Location: [place];  Service: General;  Laterality: Right;  .  THYROIDECTOMY, PARTIAL      FAMILY HISTORY Family History  Problem Relation Age of Onset  . Angina Father   . Aortic aneurysm Father   . Dementia Father   . CAD Father   . Hypertension Other   . Arrhythmia Other        Uncle- a fib  . Breast cancer Mother        dx 61s. Died at 25  . Non-Hodgkin's lymphoma Mother   . Arrhythmia Maternal Aunt        A fib  . Breast cancer Maternal Aunt 56       currently 35  . Thyroid disease Maternal Aunt   . Arrhythmia Maternal Aunt        A fib  . Breast cancer Maternal Aunt        dx 47s; currently 31s  . Colon cancer Maternal Grandmother        dx 4s; died at 72  . Thyroid disease Maternal Grandmother   . Colon cancer Maternal Grandfather        dx 68s; died in 59s  . Cancer Paternal Uncle        unknown primary in early 60s  . Breast cancer Maternal Aunt        dx 60s; died in 18s  . Thyroid disease Maternal Uncle   The patient's father died  from heart disease in the setting of dementia at the age of 40. The patient's mother died at the age of 78. She had been diagnosed with breast cancer at the age of 81. The patient is a single child. Her mother had 4 sisters, 3 of whom had breast cancer diagnosed at the age of 50, 54, and 58. There is also history of colorectal cancer on the maternal side of the family. BRCA testing negative.  GYNECOLOGIC HISTORY:   menarche age 56, first live birth age 27, the patient is [place] P2. She underwent a hysterectomy without salpingo-oophorectomy in 2012. 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 Her husband [name] also worked for [employer], running [employer], but retired in 2018.Marland Kitchen Son [name] lives in [place] where he works as a Public relations account executive. Daughter Darrick Penna lives in [place], where she works as an Engineering geologist  in a [employer]. The patient has no grandchildren. She is a Information systems manager.   ADVANCED DIRECTIVES: Not in place   HEALTH MAINTENANCE: Social History  Substance Use Topics  . Smoking status: Never Smoker  . Smokeless tobacco: Never Used  . Alcohol use 1.2 oz/week    2 Glasses of wine per week     Colonoscopy:2010   PAP:2012, s/p hysterectomy.  Bone density:2012   Lipid panel: 11/05/2013 TC 219, HDL 41, LDL 137, TG 204  No Known Allergies  Current Outpatient Prescriptions  Medication Sig Dispense Refill  . aspirin EC 81 MG tablet Take 1 tablet (81 mg total) by mouth daily.    . calcium carbonate (OSCAL) 1500 (600 Ca) MG TABS tablet Take by mouth 2 (two) times daily with a meal.    . Cholecalciferol (VITAMIN D) 2000 UNITS tablet Take 2,000 Units by mouth daily.    . diclofenac (VOLTAREN) 75 MG EC tablet Take 75 mg by mouth 2 (two) times daily as needed.    . tamoxifen (NOLVADEX) 20 MG tablet Take 1 tablet (20 mg total) by mouth daily. 90 tablet 1  . venlafaxine (EFFEXOR) 37.5 MG tablet Take as directed 30 tablet 0  . venlafaxine XR (EFFEXOR-XR) 37.5 MG 24 hr capsule TAKE 1 CAPSULE EVERY MORNING WITH BREAKFAST 30 capsule 3   No current facility-administered medications for this visit.     OBJECTIVE: middle-aged white womanIn no acute distress  Vitals:   10/26/16 1207  BP: 138/77  Pulse: 96  Resp: 18  Temp: 98.1 F (36.7 C)     Body mass index is 25.36 kg/m.      ECOG FS:0 - Asymptomatic   Sclerae unicteric, EOMs intact Oropharynx clear and moist No cervical or supraclavicular adenopathy Lungs no rales or rhonchi Heart regular rate and rhythm Abd soft, nontender, positive bowel sounds MSK no focal spinal tenderness, no upper extremity lymphedema Neuro: nonfocal, well oriented, appropriate affect Breasts: The right breast is unremarkable. The left breast has undergone lumpectomy followed by radiation, with no evidence of local recurrence. Both axillae are benign.  LAB  RESULTS:  CMP     Component Value Date/Time   NA 142 10/26/2016 1141   K 3.8 10/26/2016 1141   CL 104 11/12/2015 0820   CO2 24 10/26/2016 1141   GLUCOSE 122 10/26/2016 1141   BUN 13.2 10/26/2016 1141   CREATININE 0.9 10/26/2016 1141   CALCIUM 9.1 10/26/2016 1141  PROT 6.8 10/26/2016 1141   ALBUMIN 3.7 10/26/2016 1141   AST 20 10/26/2016 1141   ALT 26 10/26/2016 1141   ALKPHOS 72 10/26/2016 1141   BILITOT 0.28 10/26/2016 1141   I No results found for: SPEP  Lab Results  Component Value Date   WBC 5.4 10/26/2016   NEUTROABS 3.5 10/26/2016   HGB 13.1 10/26/2016   HCT 38.2 10/26/2016   MCV 93.2 10/26/2016   PLT 223 10/26/2016      Chemistry      Component Value Date/Time   NA 142 10/26/2016 1141   K 3.8 10/26/2016 1141   CL 104 11/12/2015 0820   CO2 24 10/26/2016 1141   BUN 13.2 10/26/2016 1141   CREATININE 0.9 10/26/2016 1141      Component Value Date/Time   CALCIUM 9.1 10/26/2016 1141   ALKPHOS 72 10/26/2016 1141   AST 20 10/26/2016 1141   ALT 26 10/26/2016 1141   BILITOT 0.28 10/26/2016 1141      STUDIES: Bilateral diagnostic mammography with tomography at [facility] 09/18/2016 shows a breast density to be category B. There is no evidence of malignancy  ASSESSMENT: 56 y.o. BRCA negative [place] woman status post left breast upper inner quadrant biopsy for 01/18/2014 showing invasive lobular carcinoma (E-cadherin negative), estrogen receptor 70% positive, progesterone receptor 70% positive, with an MIB-1 of 7%, and no HER-2 amplification  (1) ultrasound-guided biopsy of a suspicious lymph node in the left axilla 08/19/2012 was positive  (2) status post left lumpectomy and axillary lymph node dissection 09/09/2013 for a pT1c pN2, stage IIIa invasive lobular carcinoma, grade 1, repeat HER-2 again negative. [She had 8 of 23 axillary lymph nodes involved]  (4) Adjuvant chemotherapy consisting of doxorubicin and cyclophosphamide in dose dense fashion  x4 completed 12/18/2013;followed by paclitaxel weekly x 12 completed 03/20/2014  (5) [facility] completed in [facility] 07/01/2014  (6) started tamoxifen 08/01/2014  (a) the patient is status post simple hysterectomy, no salpingo-oophorectomy  (b) DEXA scan at [facility] internal medicine 06/30/2014 showed a T score of -1.0 (normal)  (7) hot flashes - gabapentin 365m nightly, to begin venlafaxine daily.   PLAN: NIzora GalaIs now just over 3 years out from definitive surgery for her breast cancer with no evidence of disease recurrence. This is very favorable.  She continues on tamoxifen, with excellent tolerance. The plan is to continue that for a minimum of 5 years after which we will decide whether to go for 5 additional years to switch to an aromatase inhibitor for 2 years.  She is interested in getting off the gabapentin. Without 1 all she has to do is stop. If her hot flashes at night resume she can simply go back on the medication.  The venlafaxine however even though it is very low dose will need to be tapered off. For that reason I have written her for non-extended release venlafaxine. I gave her a written schedule I did take the medications. If she follows that she will be able to get off. If she starts having headaches, mood changes, nightmares, etc. she will back off and continue the medication at the previous level  Otherwise she will return to see me in one year. She knows to call for any problems that may develop before that.   MChauncey Cruel MD 10/26/2016

## 2016-11-08 ENCOUNTER — Other Ambulatory Visit: Payer: Self-pay | Admitting: Oncology

## 2017-01-22 ENCOUNTER — Institutional Professional Consult (permissible substitution): Payer: BLUE CROSS/BLUE SHIELD | Admitting: Internal Medicine

## 2017-04-13 ENCOUNTER — Other Ambulatory Visit: Payer: Self-pay

## 2017-04-13 MED ORDER — TAMOXIFEN CITRATE 20 MG PO TABS: 20.0000 mg | ORAL_TABLET | Freq: Every day | ORAL | 1 refills | Status: DC

## 2017-06-28 ENCOUNTER — Other Ambulatory Visit: Payer: Self-pay | Admitting: Oncology

## 2017-06-28 DIAGNOSIS — Z9889 Other specified postprocedural states: Secondary | ICD-10-CM

## 2017-07-10 ENCOUNTER — Encounter: Payer: Self-pay | Admitting: Oncology

## 2017-07-31 ENCOUNTER — Other Ambulatory Visit: Payer: Self-pay | Admitting: Physician Assistant

## 2017-09-21 ENCOUNTER — Ambulatory Visit
Admission: RE | Admit: 2017-09-21 | Discharge: 2017-09-21 | Disposition: A | Discharge status: Home / Self Care | Payer: Commercial Managed Care - PPO | Source: Ambulatory Visit | Attending: Oncology | Admitting: Oncology

## 2017-09-21 DIAGNOSIS — Z9889 Other specified postprocedural states: Secondary | ICD-10-CM

## 2017-10-26 ENCOUNTER — Other Ambulatory Visit: Payer: Self-pay

## 2017-10-26 DIAGNOSIS — Z170 Estrogen receptor positive status [ER+]: Principal | ICD-10-CM

## 2017-10-26 DIAGNOSIS — C50212 Malignant neoplasm of upper-inner quadrant of left female breast: Secondary | ICD-10-CM

## 2017-10-28 NOTE — Progress Notes (Signed)
[facility]  [phone] [phone]) [phone]     ID: [name] [name] OB: Dec 04, 1960  MR#: [id number]  KYH#:[id number]  PCP: Ma Hillock, DO GYN:  [name] SU: Excell Seltzer OTHER MD: [name]  CHIEF COMPLAINT: Locally advanced breast cancer   CURRENT TREATMENT:  tamoxifen  BREAST CANCER HISTORY: From the original intake note  "[name] [name] is 57 years old and she first noted a change, like a small hard pea in her left axilla. She brought this to Dr. [name] attention and bilateral diagnostic mammography and left ultrasonography at the [facility] Center for 07/18/2013 showed a small area of asymmetry in the inner left breast measuring perhaps 5 mm which was not palpable by physical exam. In the right breast there was some loosely grouped calcifications spanning up to 0.7 cm. Ultrasound of the left breast confirmed an ill-defined area of shadowing at the 9:00 position measuring up to 1.3 cm. There was also a 4 mm circumscribed hypoechoic mass at the 10:00 position. This is felt to be probably benign. In the left axilla there were 2 lymph nodes with slightly increased cortical thickening, although the fatty hila were maintained. These were not related to the patient's feeling of a "pea like mass" in her left axilla, which had by then pretty much resolved.   On 08/07/2013 the patient underwent left breast needle core biopsy and this showed ([id number]) and invasive lobular carcinoma (E-cadherin negative) estrogen receptor 70% positive with strong staining intensity, progesterone receptor 70% positive and strong staining intensity, with a proliferation marker of 7% and no HER-2 amplification, the signals ratio being 0.94 in the number per cell 1.50.  Bilateral breast MRI 08/15/2013 found an irregular spiculated mass in the left breast measuring approximately 1.5 cm. There was no abnormal enhancement of the pectoralis. There were at least 2 left axillary lymph nodes and  showed focal cortical thickening. There was a single left internal mammary lymph node at the level of the biopsy-proven malignancy which was not pathologic in appearance. The right breast and axilla were benign.  Genetics testing may 2015 was normal and did not reveal a mutation in the genes tested: ATM, BARD1, BRCA1, BRCA2, BRIP1, CDH1, CHEK2, MRE11A, MUTYH, NBN, NF1, PALB2, PTEN, RAD50, RAD51C, RAD51D, and TP53. She is status post left lumpectomy and axillary lymph node dissection by Dr Excell Seltzer 09/09/2013 for a pT1c pN2, stage IIIa invasive lobular carcinoma, grade 1, repeat HER-2 again negative. Patient had 8/23 LN positive and also evidence of LVI (lymphovascular invasion) and PNI (perineural invasion)."  Her subsequent history is as detailed below  INTERVAL HISTORY: [name] [name] returns today for follow-up and treatment of her estrogen receptor positive breast cancer accompanied by her husband. She continues on tamoxifen, with good tolerance. Her hot flashes have decreased in occurrence and intensity. She denies issues with increased vaginal discharge.   Since her last visit, she underwent diagnostic bilateral mammography with CAD and tomography on 09/21/2017 at [facility] showing: breast density category C. There was no evidence of malignancy.    REVIEW OF SYSTEMS: [name] [name] reports that she is planning on taking it easy for the 4th of July. For exercise, she walks about 3 days per week for 30 minutes at a moderate pace.  She denies unusual headaches, visual changes, nausea, vomiting, or dizziness. There has been no unusual cough, phlegm production, or pleurisy. This been no change in bowel or bladder habits. She denies unexplained fatigue or unexplained weight loss, bleeding, rash, or fever. A  detailed review of systems was otherwise stable.   PAST MEDICAL HISTORY: Past Medical History:  Diagnosis Date  . Anxiety disorder    Mild: treated by her gynecologist with selective serotonin reuptake  inhibitor  . Arthritis    hands and feet  . Breast cancer ([facility]) 09/09/2013  . Family history of malignant neoplasm of breast   . Fibroid uterus    hysterctomy  . Hot flashes   . Hyperlipidemia   . Malignant neoplasm of breast (female), unspecified site 08/07/13   left breast invasive mammary ca in situ  . Nonspecific abnormal electrocardiogram (ECG) (EKG) 2009  . Osteoarthritis   . Palpitations 2009   Improved; Secondary to premature ventricular contractions. No problems since-pt states was caring for ailing father, anxiety  . Personal history of radiation therapy    left breast 2015  . Tick bite of right thigh 2016  . Wears glasses     PAST SURGICAL HISTORY: Past Surgical History:  Procedure Laterality Date  . ABDOMINAL HYSTERECTOMY  02/07/2011   Procedure: HYSTERECTOMY ABDOMINAL;  Surgeon: [name];  Location: [facility];  Service: Gynecology;  Laterality: N/A;  . BREAST EXCISIONAL BIOPSY Right 10/07/2014   high risk  . BREAST EXCISIONAL BIOPSY Left 12/24/2015   benign  . BREAST LUMPECTOMY Left 09/09/2013   left breast 2015  . BREAST LUMPECTOMY WITH NEEDLE LOCALIZATION AND AXILLARY LYMPH NODE DISSECTION Left 09/09/2013   Procedure: LEFT BREAST LUMPECTOMY WITH NEEDLE LOCALIZATION AND LEFT AXILLARY LYMPH NODE DISSECTION;  Surgeon: [name], MD;  Location: [facility];  Service: General;  Laterality: Left;  . BREAST LUMPECTOMY WITH RADIOACTIVE SEED LOCALIZATION Right 10/07/2014   Procedure: RIGHT BREAST LUMPECTOMY WITH RADIOACTIVE SEED LOCALIZATION;  Surgeon: Excell Seltzer, MD;  Location: [facility];  Service: General;  Laterality: Right;  . BREAST LUMPECTOMY WITH RADIOACTIVE SEED LOCALIZATION Left 12/24/2015   Procedure: LEFT BREAST LUMPECTOMY WITH RADIOACTIVE SEED LOCALIZATION;  Surgeon: Excell Seltzer, MD;  Location: [facility];  Service: General;  Laterality: Left;  . CESAREAN SECTION    . DILATION AND CURETTAGE OF  UTERUS    . ENDOMETRIAL ABLATION  2006  . LAPAROSCOPIC ASSISTED VAGINAL HYSTERECTOMY  02/07/2011   Procedure: LAPAROSCOPIC ASSISTED VAGINAL HYSTERECTOMY;  Surgeon: [name];  Location: [facility];  Service: Gynecology;  Laterality: N/A;  . PORTACATH PLACEMENT Right 09/29/2013   Procedure: INSERTION PORT-A-CATH;  Surgeon: [name], MD;  Location: [facility];  Service: General;  Laterality: Right;  . THYROIDECTOMY, PARTIAL      FAMILY HISTORY Family History  Problem Relation Age of Onset  . Angina Father   . Aortic aneurysm Father   . Dementia Father   . CAD Father   . Hypertension Other   . Arrhythmia Other        Uncle- a fib  . Breast cancer Mother        dx 74s. Died at 29  . Non-Hodgkin's lymphoma Mother   . Arrhythmia Maternal Aunt        A fib  . Breast cancer Maternal Aunt 56       currently 38  . Thyroid disease Maternal Aunt   . Arrhythmia Maternal Aunt        A fib  . Breast cancer Maternal Aunt        dx 80s; currently 21s  . Colon cancer Maternal Grandmother        dx 65s; died at 66  . Thyroid disease Maternal  Grandmother   . Colon cancer Maternal Grandfather        dx 108s; died in 76s  . Cancer Paternal Uncle        unknown primary in early 22s  . Breast cancer Maternal Aunt        dx 103s; died in 62s  . Thyroid disease Maternal Uncle   The patient's father died from heart disease in the setting of dementia at the age of 74. The patient's mother died at the age of 94. She had been diagnosed with breast cancer at the age of 1. The patient is a single child. Her mother had 4 sisters, 3 of whom had breast cancer diagnosed at the age of 66, 10, and 40. There is also history of colorectal cancer on the maternal side of the family. BRCA testing negative.  GYNECOLOGIC HISTORY:   menarche age 74, first live birth age 63, the patient is [place] P2. She underwent a hysterectomy without salpingo-oophorectomy in 2012. 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 Her husband [name] [name] also worked for [employer], running [employer], but retired in 2018.Marland Kitchen Son [name] [name] lives in [place] where he works as a Public relations account executive. Daughter [name] [name] lives in [place], where she works as an Engineering geologist in a [employer]. The patient has no grandchildren. She is a Information systems manager.   ADVANCED DIRECTIVES: Not in place   HEALTH MAINTENANCE: Social History   Tobacco Use  . Smoking status: Never Smoker  . Smokeless tobacco: Never Used  Substance Use Topics  . Alcohol use: Yes    Alcohol/week: 1.2 oz    Types: 2 Glasses of wine per week  . Drug use: Yes    Types: Marijuana    Comment: smoked marijuana last month     Colonoscopy:2010   PAP:2012, s/p hysterectomy.  Bone density:2012   Lipid panel: 11/05/2013 TC 219, HDL 41, LDL 137, TG 204  No Known Allergies  Current Outpatient Medications  Medication Sig Dispense Refill  . aspirin EC 81 MG tablet Take 1 tablet (81 mg total) by mouth daily.    . calcium carbonate (OSCAL) 1500 (600 Ca) MG TABS tablet Take by mouth 2 (two) times daily with a meal.    . Cholecalciferol (VITAMIN D) 2000 UNITS tablet Take 2,000 Units by mouth daily.    . diclofenac (VOLTAREN) 75 MG EC tablet Take 75 mg by mouth 2 (two) times daily as needed.    . tamoxifen (NOLVADEX) 20 MG tablet Take 1 tablet (20 mg total) by mouth daily. 90 tablet 1  . venlafaxine (EFFEXOR) 37.5 MG tablet Take as directed 30 tablet 0  . venlafaxine XR (EFFEXOR-XR) 37.5 MG 24 hr capsule TAKE 1 CAPSULE EVERY MORNING WITH BREAKFAST 30 capsule 3   No current facility-administered medications for this visit.     OBJECTIVE: middle-aged white woman who appears well  Vitals:   10/29/17 1313  BP: 134/88  Pulse: 81  Resp: 18  Temp: 98.3 F (36.8 C)  SpO2: 100%      Body mass index is 24.84 kg/m.      ECOG FS:0 - Asymptomatic   Sclerae unicteric, pupils round and equal Oropharynx clear and moist No cervical or supraclavicular adenopathy Lungs no rales or rhonchi Heart regular rate and rhythm Abd soft, nontender, positive bowel sounds MSK  no focal spinal tenderness, no upper extremity lymphedema Neuro: nonfocal, well oriented, appropriate affect Breasts: the right breast is unremarkable; the left breast is s/p lumpetomy and radiation. Theree is no evidence of disease recurrence; both axillae are benign  LAB RESULTS:  CMP     Component Value Date/Time   NA 140 10/29/2017 1223   NA 142 10/26/2016 1141   K 4.1 10/29/2017 1223   K 3.8 10/26/2016 1141   CL 105 10/29/2017 1223   CO2 28 10/29/2017 1223   CO2 24 10/26/2016 1141   GLUCOSE 85 10/29/2017 1223   GLUCOSE 122 10/26/2016 1141   BUN 15 10/29/2017 1223   BUN 13.2 10/26/2016 1141   CREATININE 0.87 10/29/2017 1223   CREATININE 0.9 10/26/2016 1141   CALCIUM 9.3 10/29/2017 1223   CALCIUM 9.1 10/26/2016 1141   PROT 7.4 10/29/2017 1223   PROT 6.8 10/26/2016 1141   ALBUMIN 4.2 10/29/2017 1223   ALBUMIN 3.7 10/26/2016 1141   AST 20 10/29/2017 1223   AST 20 10/26/2016 1141   ALT 27 10/29/2017 1223   ALT 26 10/26/2016 1141   ALKPHOS 72 10/29/2017 1223   ALKPHOS 72 10/26/2016 1141   BILITOT 0.3 10/29/2017 1223   BILITOT 0.28 10/26/2016 1141   GFRNONAA >60 10/29/2017 1223   GFRAA >60 10/29/2017 1223   I No results found for: SPEP  Lab Results  Component Value Date   WBC 6.4 10/29/2017   NEUTROABS 4.2 10/29/2017   HGB 13.5 10/29/2017   HCT 39.0 10/29/2017   MCV 92.7 10/29/2017   PLT 241 10/29/2017      Chemistry      Component Value Date/Time   NA 140 10/29/2017 1223   NA 142 10/26/2016 1141   K 4.1 10/29/2017 1223   K 3.8 10/26/2016 1141   CL 105 10/29/2017 1223   CO2 28 10/29/2017 1223   CO2 24 10/26/2016 1141   BUN 15 10/29/2017 1223   BUN 13.2 10/26/2016 1141    CREATININE 0.87 10/29/2017 1223   CREATININE 0.9 10/26/2016 1141      Component Value Date/Time   CALCIUM 9.3 10/29/2017 1223   CALCIUM 9.1 10/26/2016 1141   ALKPHOS 72 10/29/2017 1223   ALKPHOS 72 10/26/2016 1141   AST 20 10/29/2017 1223   AST 20 10/26/2016 1141   ALT 27 10/29/2017 1223   ALT 26 10/26/2016 1141   BILITOT 0.3 10/29/2017 1223   BILITOT 0.28 10/26/2016 1141      STUDIES: Since her last visit, she underwent diagnostic bilateral mammography with CAD and tomography on 09/21/2017 at [facility] showing: breast density category C. There was no evidence of malignancy.    ASSESSMENT: 57 y.o. BRCA negative [place] woman status post left breast upper inner quadrant biopsy for 01/18/2014 showing invasive lobular carcinoma (E-cadherin negative), estrogen receptor 70% positive, progesterone receptor 70% positive, with an MIB-1 of 7%, and no HER-2 amplification  (1) ultrasound-guided biopsy of a suspicious lymph node in the left axilla 08/19/2012 was positive  (2) status post left lumpectomy and axillary lymph node dissection 09/09/2013 for a pT1c pN2, stage IIIa invasive lobular carcinoma, grade 1, repeat HER-2 again negative. [She had 8 of 23 axillary lymph nodes involved]  (4) Adjuvant chemotherapy consisting of doxorubicin and cyclophosphamide in dose dense fashion x4 completed 12/18/2013;followed by paclitaxel weekly x 12 completed 03/20/2014  (5) radiation completed in [facility] 07/01/2014  (6) started tamoxifen 08/01/2014  (a) the patient is status post simple hysterectomy, no salpingo-oophorectomy  (b) DEXA scan  at [facility] internal medicine 06/30/2014 showed a T score of -1.0 (normal)  (7) hot flashes - gabapentin '300mg'$  nightly, to begin venlafaxine daily.   PLAN: Izora Gala is now 4 years out from definitive surgery for her breast cancer, with no evidence of disease recurrence. This is very favorable.  She is tolerating tamoxifen well and the plan will be to continue  that to a total of 5 years.  She is overdue for colonoscopy. I am referring her to the [facility] for testing.  Otherwise she will return to see me in one year. She knows toc all for any other problems that may fdevelop before then.

## 2017-10-29 ENCOUNTER — Inpatient Hospital Stay: Payer: Commercial Managed Care - PPO | Attending: Oncology | Admitting: Oncology

## 2017-10-29 ENCOUNTER — Encounter: Payer: Self-pay | Admitting: Oncology

## 2017-10-29 ENCOUNTER — Telehealth: Payer: Self-pay | Admitting: Oncology

## 2017-10-29 ENCOUNTER — Inpatient Hospital Stay: Payer: Commercial Managed Care - PPO

## 2017-10-29 VITALS — BP 134/88 | HR 81 | Temp 98.3°F | Resp 18 | Ht 66.0 in | Wt 153.9 lb

## 2017-10-29 DIAGNOSIS — Z79810 Long term (current) use of selective estrogen receptor modulators (SERMs): Secondary | ICD-10-CM | POA: Insufficient documentation

## 2017-10-29 DIAGNOSIS — C50212 Malignant neoplasm of upper-inner quadrant of left female breast: Secondary | ICD-10-CM

## 2017-10-29 DIAGNOSIS — Z170 Estrogen receptor positive status [ER+]: Secondary | ICD-10-CM | POA: Diagnosis not present

## 2017-10-29 DIAGNOSIS — Z923 Personal history of irradiation: Secondary | ICD-10-CM | POA: Insufficient documentation

## 2017-10-29 LAB — CBC WITH DIFFERENTIAL (CANCER CENTER ONLY)
BASOS ABS: 0 10*3/uL (ref 0.0–0.1)
Basophils Relative: 1 %
EOS ABS: 0.1 10*3/uL (ref 0.0–0.5)
EOS PCT: 2 %
HCT: 39 % (ref 34.8–46.6)
Hemoglobin: 13.5 g/dL (ref 11.6–15.9)
Lymphocytes Relative: 25 %
Lymphs Abs: 1.6 10*3/uL (ref 0.9–3.3)
MCH: 32.1 pg (ref 25.1–34.0)
MCHC: 34.6 g/dL (ref 31.5–36.0)
MCV: 92.7 fL (ref 79.5–101.0)
Monocytes Absolute: 0.5 10*3/uL (ref 0.1–0.9)
Monocytes Relative: 8 %
Neutro Abs: 4.2 10*3/uL (ref 1.5–6.5)
Neutrophils Relative %: 64 %
PLATELETS: 241 10*3/uL (ref 145–400)
RBC: 4.21 MIL/uL (ref 3.70–5.45)
RDW: 12.3 % (ref 11.2–14.5)
WBC: 6.4 10*3/uL (ref 3.9–10.3)

## 2017-10-29 LAB — CMP (CANCER CENTER ONLY)
ALBUMIN: 4.2 g/dL (ref 3.5–5.0)
ALT: 27 U/L (ref 0–44)
AST: 20 U/L (ref 15–41)
Alkaline Phosphatase: 72 U/L (ref 38–126)
Anion gap: 7 (ref 5–15)
BUN: 15 mg/dL (ref 6–20)
CHLORIDE: 105 mmol/L (ref 98–111)
CO2: 28 mmol/L (ref 22–32)
CREATININE: 0.87 mg/dL (ref 0.44–1.00)
Calcium: 9.3 mg/dL (ref 8.9–10.3)
GFR, Est AFR Am: >60 mL/min (ref >60)
GFR, Estimated: >60 mL/min (ref >60)
Glucose, Bld: 85 mg/dL (ref 70–99)
Potassium: 4.1 mmol/L (ref 3.5–5.1)
SODIUM: 140 mmol/L (ref 135–145)
Total Bilirubin: 0.3 mg/dL (ref 0.3–1.2)
Total Protein: 7.4 g/dL (ref 6.5–8.1)

## 2017-10-29 MED ORDER — TAMOXIFEN CITRATE 20 MG PO TABS: 20.0000 mg | ORAL_TABLET | Freq: Every day | ORAL | 1 refills | Status: DC

## 2017-10-29 NOTE — Telephone Encounter (Signed)
Called patient with appt.  Sent referral to [facility] via [facility].

## 2017-10-30 ENCOUNTER — Telehealth: Payer: Self-pay

## 2017-10-30 NOTE — Telephone Encounter (Signed)
-----   Message from Yetta Flock, MD sent at 10/30/2017  7:27 AM EDT -----   Almyra Free this patient was referred to me by Dr. Jana Hakim for a colonoscopy. Can you help coordinate for her? I think she can be directly scheduled at the [facility], unless she wants to meet me first. Thanks  ----- Message ----- From: Chauncey Cruel, MD Sent: 10/29/2017   1:23 PM To: Yetta Flock, MD

## 2017-10-30 NOTE — Telephone Encounter (Signed)
[name] [name], can you please see Dr. Doyne Keel note and contact this patient and schedule her for colonoscopy at [facility]. She will also need a pre-visit appointment. Thank you.

## 2017-11-14 ENCOUNTER — Encounter: Payer: Self-pay | Admitting: Gastroenterology

## 2017-11-14 NOTE — Telephone Encounter (Signed)
LM on Vmail to call back to schedule direct colon per Dr. Havery Moros

## 2017-11-19 ENCOUNTER — Other Ambulatory Visit: Payer: Self-pay | Admitting: *Deleted

## 2017-11-19 MED ORDER — TAMOXIFEN CITRATE 20 MG PO TABS: 20.0000 mg | ORAL_TABLET | Freq: Every day | ORAL | 1 refills | Status: DC

## 2017-11-21 ENCOUNTER — Ambulatory Visit (AMBULATORY_SURGERY_CENTER): Payer: Self-pay

## 2017-11-21 VITALS — Ht 67.0 in | Wt 154.8 lb

## 2017-11-21 DIAGNOSIS — Z1211 Encounter for screening for malignant neoplasm of colon: Secondary | ICD-10-CM

## 2017-11-21 MED ORDER — NA SULFATE-K SULFATE-MG SULF 17.5-3.13-1.6 GM/177ML PO SOLN
1.0000 | Freq: Once | ORAL | 0 refills | Status: AC
Start: 2017-11-21 — End: 2017-11-21

## 2017-11-26 LAB — HM DEXA SCAN

## 2017-12-02 ENCOUNTER — Encounter: Payer: Self-pay | Admitting: Oncology

## 2017-12-07 ENCOUNTER — Encounter: Payer: Self-pay | Admitting: Gastroenterology

## 2017-12-07 ENCOUNTER — Ambulatory Visit (AMBULATORY_SURGERY_CENTER): Payer: Commercial Managed Care - PPO | Admitting: Gastroenterology

## 2017-12-07 VITALS — BP 125/47 | HR 82 | Temp 98.9°F | Resp 14 | Ht 67.0 in | Wt 154.0 lb

## 2017-12-07 DIAGNOSIS — D120 Benign neoplasm of cecum: Secondary | ICD-10-CM

## 2017-12-07 DIAGNOSIS — Z1211 Encounter for screening for malignant neoplasm of colon: Secondary | ICD-10-CM

## 2017-12-07 MED ORDER — SODIUM CHLORIDE 0.9 % IV SOLN
500.0000 mL | Freq: Once | INTRAVENOUS | Status: DC
Start: 2017-12-07 — End: 2017-12-07

## 2017-12-07 NOTE — Op Note (Signed)
[facility] Patient Name: [name] [name] Procedure Date: 12/07/2017 1:28 PM MRN: [id number] Endoscopist: Remo Lipps P. [name] [name] , MD Age: 57 Referring MD:  Date of Birth: 12-30-1960 Gender: Female Account #: [id number] Procedure:                Colonoscopy Indications:              Screening for colorectal malignant neoplasm Medicines:                Monitored Anesthesia Care Procedure:                Pre-Anesthesia Assessment:                           - Prior to the procedure, a History and Physical                            was performed, and patient medications and                            allergies were reviewed. The patient's tolerance of                            previous anesthesia was also reviewed. The risks                            and benefits of the procedure and the sedation                            options and risks were discussed with the patient.                            All questions were answered, and informed consent                            was obtained. Prior Anticoagulants: The patient has                            taken no previous anticoagulant or antiplatelet                            agents. ASA Grade Assessment: II - A patient with                            mild systemic disease. After reviewing the risks                            and benefits, the patient was deemed in                            satisfactory condition to undergo the procedure.                           After obtaining informed consent, the colonoscope  was passed under direct vision. Throughout the                            procedure, the patient's blood pressure, pulse, and                            oxygen saturations were monitored continuously. The                            Model PCF-H190DL [id number]) scope was introduced                            through the anus and advanced to the the cecum,                            identified by  appendiceal orifice and ileocecal                            valve. The colonoscopy was performed without                            difficulty. The patient tolerated the procedure                            well. The quality of the bowel preparation was                            good. The ileocecal valve, appendiceal orifice, and                            rectum were photographed. Scope In: 1:31:15 PM Scope Out: 1:46:48 PM Scope Withdrawal Time: 0 hours 11 minutes 16 seconds  Total Procedure Duration: 0 hours 15 minutes 33 seconds  Findings:                 The perianal and digital rectal examinations were                            normal.                           A 3 mm polyp was found in the cecum. The polyp was                            sessile. The polyp was removed with a cold snare.                            Resection and retrieval were complete.                           Internal hemorrhoids were found during retroflexion.                           The exam was otherwise without abnormality. Complications:  No immediate complications. Estimated blood loss:                            Minimal. Estimated Blood Loss:     Estimated blood loss was minimal. Impression:               - One 3 mm polyp in the cecum, removed with a cold                            snare. Resected and retrieved.                           - Internal hemorrhoids.                           - The examination was otherwise normal. Recommendation:           - Patient has a contact number available for                            emergencies. The signs and symptoms of potential                            delayed complications were discussed with the                            patient. Return to normal activities tomorrow.                            Written discharge instructions were provided to the                            patient.                           - Resume previous diet.                            - Continue present medications.                           - Await pathology results.                           - Repeat colonoscopy for surveillance based on                            pathology results. Remo Lipps P. [name] Milone, MD 12/07/2017 1:52:13 PM This report has been signed electronically.

## 2017-12-07 NOTE — Patient Instructions (Signed)
YOU HAD AN ENDOSCOPIC PROCEDURE TODAY AT [facility]:   Refer to the procedure report that was given to you for any specific questions about what was found during the examination.  If the procedure report does not answer your questions, please call your gastroenterologist to clarify.  If you requested that your care partner not be given the details of your procedure findings, then the procedure report has been included in a sealed envelope for you to review at your convenience later.  YOU SHOULD EXPECT: Some feelings of bloating in the abdomen. Passage of more gas than usual.  Walking can help get rid of the air that was put into your GI tract during the procedure and reduce the bloating. If you had a lower endoscopy (such as a colonoscopy or flexible sigmoidoscopy) you may notice spotting of blood in your stool or on the toilet paper. If you underwent a bowel prep for your procedure, you may not have a normal bowel movement for a few days.  Please Note:  You might notice some irritation and congestion in your nose or some drainage.  This is from the oxygen used during your procedure.  There is no need for concern and it should clear up in a day or so.  SYMPTOMS TO REPORT IMMEDIATELY:   Following lower endoscopy (colonoscopy or flexible sigmoidoscopy):  Excessive amounts of blood in the stool  Significant tenderness or worsening of abdominal pains  Swelling of the abdomen that is new, acute  Fever of 100F or higher   Following upper endoscopy (EGD)  Vomiting of blood or coffee ground material  New chest pain or pain under the shoulder blades  Painful or persistently difficult swallowing  New shortness of breath  Fever of 100F or higher  Black, tarry-looking stools  For urgent or emergent issues, a gastroenterologist can be reached at any hour by calling [phone].   DIET:  We do recommend a small meal at first, but then you may proceed to your regular diet.  Drink  plenty of fluids but you should avoid alcoholic beverages for 24 hours.  ACTIVITY:  You should plan to take it easy for the rest of today and you should NOT DRIVE or use heavy machinery until tomorrow (because of the sedation medicines used during the test).    FOLLOW UP: Our staff will call the number listed on your records the next business day following your procedure to check on you and address any questions or concerns that you may have regarding the information given to you following your procedure. If we do not reach you, we will leave a message.  However, if you are feeling well and you are not experiencing any problems, there is no need to return our call.  We will assume that you have returned to your regular daily activities without incident.  If any biopsies were taken you will be contacted by phone or by letter within the next 1-3 weeks.  Please call us at [phone] if you have not heard about the biopsies in 3 weeks.    SIGNATURES/CONFIDENTIALITY: You and/or your care partner have signed paperwork which will be entered into your electronic medical record.  These signatures attest to the fact that that the information above on your After Visit Summary has been reviewed and is understood.  Full responsibility of the confidentiality of this discharge information lies with you and/or your care-partner.  Polyp and hemorrhoid information given.

## 2017-12-07 NOTE — Progress Notes (Signed)
Called to room to assist during endoscopic procedure.  Patient ID and intended procedure confirmed with present staff. Received instructions for my participation in the procedure from the performing physician.  

## 2017-12-07 NOTE — Progress Notes (Signed)
A and O x3. Report to RN. Tolerated MAC anesthesia well.

## 2017-12-07 NOTE — Progress Notes (Signed)
Pt's states no medical or surgical changes since previsit or office visit. 

## 2017-12-10 ENCOUNTER — Telehealth: Payer: Self-pay

## 2017-12-10 ENCOUNTER — Telehealth: Payer: Self-pay | Admitting: *Deleted

## 2017-12-10 NOTE — Telephone Encounter (Signed)
  Follow up Call-  Call back number 12/07/2017  Post procedure Call Back phone  # [phone]  Permission to leave phone message Yes  Some recent data might be hidden     Patient questions:  Do you have a fever, pain , or abdominal swelling? No. Pain Score  0 *  Have you tolerated food without any problems? Yes.    Have you been able to return to your normal activities? Yes.    Do you have any questions about your discharge instructions: Diet   No. Medications  No. Follow up visit  No.  Do you have questions or concerns about your Care? No.  Actions: * If pain score is 4 or above: No action needed, pain <4.

## 2017-12-10 NOTE — Telephone Encounter (Signed)
  Follow up Call-  Call back number 12/07/2017  Post procedure Call Back phone  # [phone]  Permission to leave phone message Yes  Some recent data might be hidden     Patient questions:  Message left to call us if necessary.

## 2017-12-18 ENCOUNTER — Telehealth: Payer: Self-pay | Admitting: Gastroenterology

## 2017-12-18 NOTE — Telephone Encounter (Signed)
Patient advised that Dr. Havery Moros has been out of the office and will return tomorrow, we will let her know as soon as he has had a chance to review results.

## 2018-06-24 ENCOUNTER — Other Ambulatory Visit: Payer: Self-pay | Admitting: Oncology

## 2018-06-24 DIAGNOSIS — Z9889 Other specified postprocedural states: Secondary | ICD-10-CM

## 2018-07-09 ENCOUNTER — Other Ambulatory Visit: Payer: Self-pay

## 2018-07-09 MED ORDER — TAMOXIFEN CITRATE 20 MG PO TABS: 20.0000 mg | ORAL_TABLET | Freq: Every day | ORAL | 1 refills | Status: DC

## 2018-09-24 ENCOUNTER — Other Ambulatory Visit: Payer: Self-pay

## 2018-09-24 ENCOUNTER — Ambulatory Visit
Admission: RE | Admit: 2018-09-24 | Discharge: 2018-09-24 | Disposition: A | Discharge status: Home / Self Care | Payer: Commercial Managed Care - PPO | Source: Ambulatory Visit | Attending: Oncology | Admitting: Oncology

## 2018-09-24 DIAGNOSIS — Z9889 Other specified postprocedural states: Secondary | ICD-10-CM

## 2018-09-24 LAB — HM MAMMOGRAPHY

## 2018-10-03 ENCOUNTER — Other Ambulatory Visit: Payer: Self-pay | Admitting: Hematology and Oncology

## 2018-10-29 NOTE — Progress Notes (Signed)
[place]  [phone] [phone]     ID: [name] [name] OB: 11/08/1960  MR#: [id number]  VOZ#:[id number]  Patient Care Team: Ma Hillock, DO as PCP - General (Family Medicine) Excell Seltzer, MD as Consulting Physician (General Surgery) Schae Cando, Virgie Dad, MD as Consulting Physician (Oncology) [name], MD as Consulting Physician (Obstetrics and Gynecology) Havery Moros, Carlota Raspberry, MD as Consulting Physician (Gastroenterology) OTHER MD: [name]   CHIEF COMPLAINT: Locally advanced breast cancer   CURRENT TREATMENT:  tamoxifen   BREAST CANCER HISTORY: From the original intake note  "[name] [name] is 58 years old and she first noted a change, like a small hard pea in her left axilla. She brought this to Dr. [name] attention and bilateral diagnostic mammography and left ultrasonography at the breast Center for 07/18/2013 showed a small area of asymmetry in the inner left breast measuring perhaps 5 mm which was not palpable by physical exam. In the right breast there was some loosely grouped calcifications spanning up to 0.7 cm. Ultrasound of the left breast confirmed an ill-defined area of shadowing at the 9:00 position measuring up to 1.3 cm. There was also a 4 mm circumscribed hypoechoic mass at the 10:00 position. This is felt to be probably benign. In the left axilla there were 2 lymph nodes with slightly increased cortical thickening, although the fatty hila were maintained. These were not related to the patient's feeling of a "pea like mass" in her left axilla, which had by then pretty much resolved.   On 08/07/2013 the patient underwent left breast needle core biopsy and this showed ([id number]) and invasive lobular carcinoma (E-cadherin negative) estrogen receptor 70% positive with strong staining intensity, progesterone receptor 70% positive and strong staining intensity, with a proliferation marker of 7% and no HER-2 amplification, the  signals ratio being 0.94 in the number per cell 1.50.  Bilateral breast MRI 08/15/2013 found an irregular spiculated mass in the left breast measuring approximately 1.5 cm. There was no abnormal enhancement of the pectoralis. There were at least 2 left axillary lymph nodes and showed focal cortical thickening. There was a single left internal mammary lymph node at the level of the biopsy-proven malignancy which was not pathologic in appearance. The right breast and axilla were benign.  Genetics testing may 2015 was normal and did not reveal a mutation in the genes tested: ATM, BARD1, BRCA1, BRCA2, BRIP1, CDH1, CHEK2, MRE11A, MUTYH, NBN, NF1, PALB2, PTEN, RAD50, RAD51C, RAD51D, and TP53. She is status post left lumpectomy and axillary lymph node dissection by Dr Excell Seltzer 09/09/2013 for a pT1c pN2, stage IIIa invasive lobular carcinoma, grade 1, repeat HER-2 again negative. Patient had 8/23 LN positive and also evidence of LVI (lymphovascular invasion) and PNI (perineural invasion)."  Her subsequent history is as detailed below   INTERVAL HISTORY: [name] [name] returns today for follow-up and treatment of her estrogen receptor positive breast cancer.  She continues on tamoxifen. She continues to have hot flashes, but they have decreased in intensity as compared to in the past. She does not have any difficulties with vaginal wetness.   Since her last visit here, she underwent a digital diagnostic bilateral mammogram with tomography on 09/24/2018 showing: Breast Density Category B. There is no mammographic evidence of malignancy.     REVIEW OF SYSTEMS: [name] [name] has continued to work amid the pandemic, but she is taking appropriate precautions against the spread. For exercise, she does not have a formal exercise routine, but she does walk some and  she likes to garden. She notes that she gets hoarse sometimes, but she does talk a lot at work, have allergies, and have acid reflux issues. She does occasionally have  shooting pains under her breast, but she knows that these are benign and due to her surgery. The patient denies unusual headaches, visual changes, nausea, vomiting, or dizziness. There has been no unusual cough, phlegm production, or pleurisy. This been no change in bowel or bladder habits. The patient denies unexplained fatigue or unexplained weight loss, bleeding, rash, or fever. A detailed review of systems was otherwise noncontributory.    PAST MEDICAL HISTORY: Past Medical History:  Diagnosis Date  . Anxiety disorder    Mild: treated by her gynecologist with selective serotonin reuptake inhibitor  . Arthritis    hands and feet  . Breast cancer ([place]) 09/09/2013  . Family history of malignant neoplasm of breast   . Fibroid uterus    hysterctomy  . GERD (gastroesophageal reflux disease)   . Hot flashes   . Hyperlipidemia   . Malignant neoplasm of breast (female), unspecified site 08/07/13   left breast invasive mammary ca in situ  . Nonspecific abnormal electrocardiogram (ECG) (EKG) 2009  . Osteoarthritis   . Palpitations 2009   Improved; Secondary to premature ventricular contractions. No problems since-pt states was caring for ailing father, anxiety  . Personal history of radiation therapy    left breast 2015  . Thyroid disease   . Tick bite of right thigh 2016  . Wears glasses     PAST SURGICAL HISTORY: Past Surgical History:  Procedure Laterality Date  . ABDOMINAL HYSTERECTOMY  02/07/2011   Procedure: HYSTERECTOMY ABDOMINAL;  Surgeon: [name];  Location: [facility];  Service: Gynecology;  Laterality: N/A;  . BREAST EXCISIONAL BIOPSY Right 10/07/2014   high risk  . BREAST EXCISIONAL BIOPSY Left 12/24/2015   benign  . BREAST LUMPECTOMY Left 09/09/2013   left breast 2015  . BREAST LUMPECTOMY WITH NEEDLE LOCALIZATION AND AXILLARY LYMPH NODE DISSECTION Left 09/09/2013   Procedure: LEFT BREAST LUMPECTOMY WITH NEEDLE LOCALIZATION AND LEFT AXILLARY LYMPH NODE DISSECTION;   Surgeon: [name], MD;  Location: [facility];  Service: General;  Laterality: Left;  . BREAST LUMPECTOMY WITH RADIOACTIVE SEED LOCALIZATION Right 10/07/2014   Procedure: RIGHT BREAST LUMPECTOMY WITH RADIOACTIVE SEED LOCALIZATION;  Surgeon: Excell Seltzer, MD;  Location: [facility];  Service: General;  Laterality: Right;  . BREAST LUMPECTOMY WITH RADIOACTIVE SEED LOCALIZATION Left 12/24/2015   Procedure: LEFT BREAST LUMPECTOMY WITH RADIOACTIVE SEED LOCALIZATION;  Surgeon: Excell Seltzer, MD;  Location: [facility];  Service: General;  Laterality: Left;  . CESAREAN SECTION    . DILATION AND CURETTAGE OF UTERUS    . ENDOMETRIAL ABLATION  2006  . LAPAROSCOPIC ASSISTED VAGINAL HYSTERECTOMY  02/07/2011   Procedure: LAPAROSCOPIC ASSISTED VAGINAL HYSTERECTOMY;  Surgeon: [name];  Location: [facility];  Service: Gynecology;  Laterality: N/A;  . PORTACATH PLACEMENT Right 09/29/2013   Procedure: INSERTION PORT-A-CATH;  Surgeon: [name], MD;  Location: [facility];  Service: General;  Laterality: Right;  . THYROIDECTOMY, PARTIAL      FAMILY HISTORY Family History  Problem Relation Age of Onset  . Angina Father   . Aortic aneurysm Father   . Dementia Father   . CAD Father   . Hypertension Other   . Arrhythmia Other        Uncle- a fib  . Breast cancer Mother  dx 90s. Died at 50  . Non-Hodgkin's lymphoma Mother   . Arrhythmia Maternal Aunt        A fib  . Breast cancer Maternal Aunt 56       currently 36  . Thyroid disease Maternal Aunt   . Arrhythmia Maternal Aunt        A fib  . Breast cancer Maternal Aunt        dx 43s; currently 46s  . Colon cancer Maternal Grandmother        dx 32s; died at 80  . Thyroid disease Maternal Grandmother   . Cancer Paternal Uncle        unknown primary in early 25s  . Breast cancer Maternal Aunt        dx 86s; died in 59s  . Thyroid disease Maternal Uncle   .  Rectal cancer Paternal Grandmother   . Stomach cancer Neg Hx   . Esophageal cancer Neg Hx   The patient's father died from heart disease in the setting of dementia at the age of 44. The patient's mother died at the age of 33. She had been diagnosed with breast cancer at the age of 23. The patient is a single child. Her mother had 4 sisters, 3 of whom had breast cancer diagnosed at the age of 6, 23, and 25. There is also history of colorectal cancer on the maternal side of the family. BRCA testing negative.   GYNECOLOGIC HISTORY:   menarche age 30, first live birth age 28, the patient is [place] P2. She underwent a hysterectomy without salpingo-oophorectomy in 2012. 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 Her husband [name] also worked for [employer], running [employer], but retired in 2018.Marland Kitchen Son [name] lives in [place] where he works as a Public relations account executive. Daughter Darrick Penna lives in [place], where she works as an Engineering geologist in a [employer]. The patient has no grandchildren. She is a Information systems manager.   ADVANCED DIRECTIVES: Not in place    HEALTH MAINTENANCE: Social History   Tobacco Use  . Smoking status: Never Smoker  . Smokeless tobacco: Never Used  Substance Use Topics  . Alcohol use: Yes    Alcohol/week: 2.0 standard drinks    Types: 2 Glasses of wine per week  . Drug use: Not Currently    Comment: smoked marijuana last month     Colonoscopy: 12/07/2017-[facility]/benign polyp/repeat in 10 years  PAP:2012, s/p hysterectomy.  Bone density:2012   Lipid panel: 11/05/2013 TC 219, HDL 41, LDL 137, TG 204  No Known Allergies  Current Outpatient Medications  Medication Sig Dispense Refill  . Cholecalciferol (VITAMIN D) 2000 UNITS tablet Take 2,000 Units by mouth daily.    Marland Kitchen OVER THE COUNTER  MEDICATION Melaonin, 3 mg before bedtime.    . tamoxifen (NOLVADEX) 20 MG tablet Take 1 tablet (20 mg total) by mouth daily. 90 tablet 4   No current facility-administered medications for this visit.     OBJECTIVE: middle-aged white woman in no acute distress  Vitals:   10/30/18 1137  BP: (!) 116/53  Pulse: 87  Resp: 18  Temp: 98.7 F (37.1 C)  SpO2: 100%     Body mass index is 24.48 kg/m.      ECOG FS:1 - Symptomatic but completely ambulatory   Sclerae  unicteric, EOMs intact Wearing a mask No cervical or supraclavicular adenopathy Lungs no rales or rhonchi Heart regular rate and rhythm Abd soft, nontender, positive bowel sounds MSK no focal spinal tenderness, no upper extremity lymphedema Neuro: nonfocal, well oriented, appropriate affect Breasts: The right breast is benign.  The left breast has undergone lumpectomy followed by radiation with no evidence of disease recurrence.  Both axillae are benign.   LAB RESULTS:  CMP     Component Value Date/Time   NA 140 10/29/2017 1223   NA 142 10/26/2016 1141   K 4.1 10/29/2017 1223   K 3.8 10/26/2016 1141   CL 105 10/29/2017 1223   CO2 28 10/29/2017 1223   CO2 24 10/26/2016 1141   GLUCOSE 85 10/29/2017 1223   GLUCOSE 122 10/26/2016 1141   BUN 15 10/29/2017 1223   BUN 13.2 10/26/2016 1141   CREATININE 0.87 10/29/2017 1223   CREATININE 0.9 10/26/2016 1141   CALCIUM 9.3 10/29/2017 1223   CALCIUM 9.1 10/26/2016 1141   PROT 7.4 10/29/2017 1223   PROT 6.8 10/26/2016 1141   ALBUMIN 4.2 10/29/2017 1223   ALBUMIN 3.7 10/26/2016 1141   AST 20 10/29/2017 1223   AST 20 10/26/2016 1141   ALT 27 10/29/2017 1223   ALT 26 10/26/2016 1141   ALKPHOS 72 10/29/2017 1223   ALKPHOS 72 10/26/2016 1141   BILITOT 0.3 10/29/2017 1223   BILITOT 0.28 10/26/2016 1141   GFRNONAA >60 10/29/2017 1223   GFRAA >60 10/29/2017 1223   I No results found for: SPEP  Lab Results  Component Value Date   WBC 5.4 10/30/2018   NEUTROABS 3.1  10/30/2018   HGB 12.8 10/30/2018   HCT 37.6 10/30/2018   MCV 91.9 10/30/2018   PLT 230 10/30/2018      Chemistry      Component Value Date/Time   NA 140 10/29/2017 1223   NA 142 10/26/2016 1141   K 4.1 10/29/2017 1223   K 3.8 10/26/2016 1141   CL 105 10/29/2017 1223   CO2 28 10/29/2017 1223   CO2 24 10/26/2016 1141   BUN 15 10/29/2017 1223   BUN 13.2 10/26/2016 1141   CREATININE 0.87 10/29/2017 1223   CREATININE 0.9 10/26/2016 1141      Component Value Date/Time   CALCIUM 9.3 10/29/2017 1223   CALCIUM 9.1 10/26/2016 1141   ALKPHOS 72 10/29/2017 1223   ALKPHOS 72 10/26/2016 1141   AST 20 10/29/2017 1223   AST 20 10/26/2016 1141   ALT 27 10/29/2017 1223   ALT 26 10/26/2016 1141   BILITOT 0.3 10/29/2017 1223   BILITOT 0.28 10/26/2016 1141      STUDIES: No results found.   ASSESSMENT: 58 y.o. BRCA negative [place] woman status post left breast upper inner quadrant biopsy for 01/18/2014 showing invasive lobular carcinoma (E-cadherin negative), estrogen receptor 70% positive, progesterone receptor 70% positive, with an MIB-1 of 7%, and no HER-2 amplification  (1) ultrasound-guided biopsy of a suspicious lymph node in the left axilla 08/19/2012 was positive  (2) status post left lumpectomy and axillary lymph node dissection 09/09/2013 for a pT1c pN2, stage IIIa invasive lobular carcinoma, grade 1, repeat HER-2 again negative. [She had 8 of 23 axillary lymph nodes involved]  (4) Adjuvant chemotherapy consisting of doxorubicin and cyclophosphamide in dose dense fashion x4 completed 12/18/2013;followed by paclitaxel weekly x 12 completed 03/20/2014  (5) radiation completed [facility] 07/01/2014  (6) started tamoxifen 08/01/2014  (a) the patient is status post simple hysterectomy, no salpingo-oophorectomy  (  b) DEXA scan at [facility] internal medicine 06/30/2014 showed a T score of -1.0 (normal)  (7) hot flashes - gabapentin 3110m nightly, to begin venlafaxine daily.   PLAN:  [name] Galais now 5 years out from definitive surgery for her breast cancer with no evidence of disease recurrence.  This is very favorable.  She is tolerating tamoxifen remarkably well.  When I see her next year she will have the option of discontinuing tamoxifen in "graduating", or continuing tamoxifen an additional 5 years.  Since she did have N2 disease, continuing may be a good option for her  I have encouraged her to exercise more and to be extra careful regarding the pandemic  She knows to call for any other problem that may develop before the next visit.  [name] [name], GVirgie Dad MD  10/30/18 11:58 AM Medical Oncology and Hematology [facility] [address]Tel. [phone]   Fax. [phone] I, AJacqualyn Poseyam acting as a sEducation administratorfor GChauncey Cruel MD.   I, GLurline DelMD, have reviewed the above documentation for accuracy and completeness, and I agree with the above.

## 2018-10-30 ENCOUNTER — Inpatient Hospital Stay (HOSPITAL_BASED_OUTPATIENT_CLINIC_OR_DEPARTMENT_OTHER): Payer: Commercial Managed Care - PPO | Admitting: Oncology

## 2018-10-30 ENCOUNTER — Inpatient Hospital Stay: Payer: Commercial Managed Care - PPO | Attending: Oncology

## 2018-10-30 ENCOUNTER — Other Ambulatory Visit: Payer: Self-pay

## 2018-10-30 VITALS — BP 116/53 | HR 87 | Temp 98.7°F | Resp 18 | Ht 67.0 in | Wt 156.3 lb

## 2018-10-30 DIAGNOSIS — C50212 Malignant neoplasm of upper-inner quadrant of left female breast: Secondary | ICD-10-CM | POA: Insufficient documentation

## 2018-10-30 DIAGNOSIS — Z90710 Acquired absence of both cervix and uterus: Secondary | ICD-10-CM | POA: Insufficient documentation

## 2018-10-30 DIAGNOSIS — Z923 Personal history of irradiation: Secondary | ICD-10-CM | POA: Diagnosis not present

## 2018-10-30 DIAGNOSIS — Z79810 Long term (current) use of selective estrogen receptor modulators (SERMs): Secondary | ICD-10-CM | POA: Diagnosis not present

## 2018-10-30 DIAGNOSIS — Z79899 Other long term (current) drug therapy: Secondary | ICD-10-CM | POA: Insufficient documentation

## 2018-10-30 DIAGNOSIS — R232 Flushing: Secondary | ICD-10-CM

## 2018-10-30 DIAGNOSIS — Z170 Estrogen receptor positive status [ER+]: Secondary | ICD-10-CM | POA: Insufficient documentation

## 2018-10-30 LAB — CBC WITH DIFFERENTIAL/PLATELET
Abs Immature Granulocytes: 0.02 10*3/uL (ref 0.00–0.07)
Basophils Absolute: 0 10*3/uL (ref 0.0–0.1)
Basophils Relative: 1 %
Eosinophils Absolute: 0.1 10*3/uL (ref 0.0–0.5)
Eosinophils Relative: 2 %
HCT: 37.6 % (ref 36.0–46.0)
Hemoglobin: 12.8 g/dL (ref 12.0–15.0)
Immature Granulocytes: 0 %
Lymphocytes Relative: 30 %
Lymphs Abs: 1.6 10*3/uL (ref 0.7–4.0)
MCH: 31.3 pg (ref 26.0–34.0)
MCHC: 34 g/dL (ref 30.0–36.0)
MCV: 91.9 fL (ref 80.0–100.0)
Monocytes Absolute: 0.5 10*3/uL (ref 0.1–1.0)
Monocytes Relative: 10 %
Neutro Abs: 3.1 10*3/uL (ref 1.7–7.7)
Neutrophils Relative %: 57 %
Platelets: 230 10*3/uL (ref 150–400)
RBC: 4.09 MIL/uL (ref 3.87–5.11)
RDW: 12.1 % (ref 11.5–15.5)
WBC: 5.4 10*3/uL (ref 4.0–10.5)
nRBC: 0 % (ref 0.0–0.2)

## 2018-10-30 LAB — COMPREHENSIVE METABOLIC PANEL
ALT: 28 U/L (ref 0–44)
AST: 21 U/L (ref 15–41)
Albumin: 3.8 g/dL (ref 3.5–5.0)
Alkaline Phosphatase: 65 U/L (ref 38–126)
Anion gap: 10 (ref 5–15)
BUN: 15 mg/dL (ref 6–20)
CO2: 25 mmol/L (ref 22–32)
Calcium: 8.9 mg/dL (ref 8.9–10.3)
Chloride: 105 mmol/L (ref 98–111)
Creatinine, Ser: 0.91 mg/dL (ref 0.44–1.00)
GFR calc Af Amer: >60 mL/min (ref >60)
GFR calc non Af Amer: >60 mL/min (ref >60)
Glucose, Bld: 98 mg/dL (ref 70–99)
Potassium: 4.1 mmol/L (ref 3.5–5.1)
Sodium: 140 mmol/L (ref 135–145)
Total Bilirubin: 0.4 mg/dL (ref 0.3–1.2)
Total Protein: 7 g/dL (ref 6.5–8.1)

## 2018-10-30 MED ORDER — TAMOXIFEN CITRATE 20 MG PO TABS: 20.0000 mg | ORAL_TABLET | Freq: Every day | ORAL | 4 refills | Status: DC

## 2018-10-31 ENCOUNTER — Telehealth: Payer: Self-pay | Admitting: Oncology

## 2018-10-31 NOTE — Telephone Encounter (Signed)
I could not reach I will mail

## 2019-01-30 ENCOUNTER — Telehealth: Payer: Self-pay

## 2019-01-30 ENCOUNTER — Other Ambulatory Visit: Payer: Self-pay

## 2019-01-30 ENCOUNTER — Encounter: Payer: Self-pay | Admitting: Family Medicine

## 2019-01-30 ENCOUNTER — Ambulatory Visit (INDEPENDENT_AMBULATORY_CARE_PROVIDER_SITE_OTHER): Payer: Commercial Managed Care - PPO | Admitting: Family Medicine

## 2019-01-30 VITALS — BP 109/75 | HR 90 | Temp 97.7°F | Resp 16 | Ht 67.13 in | Wt 156.2 lb

## 2019-01-30 DIAGNOSIS — Z0000 Encounter for general adult medical examination without abnormal findings: Secondary | ICD-10-CM

## 2019-01-30 DIAGNOSIS — R7401 Elevation of levels of liver transaminase levels: Secondary | ICD-10-CM | POA: Diagnosis not present

## 2019-01-30 DIAGNOSIS — E0789 Other specified disorders of thyroid: Secondary | ICD-10-CM | POA: Diagnosis not present

## 2019-01-30 DIAGNOSIS — E559 Vitamin D deficiency, unspecified: Secondary | ICD-10-CM | POA: Diagnosis not present

## 2019-01-30 DIAGNOSIS — Z131 Encounter for screening for diabetes mellitus: Secondary | ICD-10-CM | POA: Diagnosis not present

## 2019-01-30 DIAGNOSIS — Z13220 Encounter for screening for lipoid disorders: Secondary | ICD-10-CM | POA: Diagnosis not present

## 2019-01-30 DIAGNOSIS — M8580 Other specified disorders of bone density and structure, unspecified site: Secondary | ICD-10-CM

## 2019-01-30 DIAGNOSIS — D702 Other drug-induced agranulocytosis: Secondary | ICD-10-CM | POA: Diagnosis not present

## 2019-01-30 LAB — HEMOGLOBIN A1C: Hgb A1c MFr Bld: 5.3 % (ref 4.6–6.5)

## 2019-01-30 LAB — CBC WITH DIFFERENTIAL/PLATELET
Basophils Absolute: 0 10*3/uL (ref 0.0–0.1)
Basophils Relative: 0.8 % (ref 0.0–3.0)
Eosinophils Absolute: 0.3 10*3/uL (ref 0.0–0.7)
Eosinophils Relative: 4.7 % (ref 0.0–5.0)
HCT: 34.6 % — ABNORMAL LOW (ref 36.0–46.0)
Hemoglobin: 11.7 g/dL — ABNORMAL LOW (ref 12.0–15.0)
Lymphocytes Relative: 26.6 % (ref 12.0–46.0)
Lymphs Abs: 1.4 10*3/uL (ref 0.7–4.0)
MCHC: 33.8 g/dL (ref 30.0–36.0)
MCV: 93.1 fl (ref 78.0–100.0)
Monocytes Absolute: 0.4 10*3/uL (ref 0.1–1.0)
Monocytes Relative: 6.7 % (ref 3.0–12.0)
Neutro Abs: 3.3 10*3/uL (ref 1.4–7.7)
Neutrophils Relative %: 61.2 % (ref 43.0–77.0)
Platelets: 261 10*3/uL (ref 150.0–400.0)
RBC: 3.71 Mil/uL — ABNORMAL LOW (ref 3.87–5.11)
RDW: 13 % (ref 11.5–15.5)
WBC: 5.4 10*3/uL (ref 4.0–10.5)

## 2019-01-30 LAB — COMPREHENSIVE METABOLIC PANEL
ALT: 23 U/L (ref 0–35)
AST: 20 U/L (ref 0–37)
Albumin: 4.2 g/dL (ref 3.5–5.2)
Alkaline Phosphatase: 53 U/L (ref 39–117)
BUN: 16 mg/dL (ref 6–23)
CO2: 26 mEq/L (ref 19–32)
Calcium: 9.2 mg/dL (ref 8.4–10.5)
Chloride: 104 mEq/L (ref 96–112)
Creatinine, Ser: 0.75 mg/dL (ref 0.40–1.20)
GFR: 78.82 mL/min (ref >60.00)
Glucose, Bld: 111 mg/dL — ABNORMAL HIGH (ref 70–99)
Potassium: 4.7 mEq/L (ref 3.5–5.1)
Sodium: 139 mEq/L (ref 135–145)
Total Bilirubin: 0.3 mg/dL (ref 0.2–1.2)
Total Protein: 6.5 g/dL (ref 6.0–8.3)

## 2019-01-30 LAB — VITAMIN D 25 HYDROXY (VIT D DEFICIENCY, FRACTURES): VITD: 36.84 ng/mL (ref 30.00–100.00)

## 2019-01-30 LAB — LIPID PANEL
Cholesterol: 177 mg/dL (ref 0–200)
HDL: 39.5 mg/dL (ref >39.00)
NonHDL: 137.05
Total CHOL/HDL Ratio: 4
Triglycerides: 202 mg/dL — ABNORMAL HIGH (ref 0.0–149.0)
VLDL: 40.4 mg/dL — ABNORMAL HIGH (ref 0.0–40.0)

## 2019-01-30 LAB — T4, FREE: Free T4: 0.85 ng/dL (ref 0.60–1.60)

## 2019-01-30 LAB — LDL CHOLESTEROL, DIRECT: Direct LDL: 105 mg/dL

## 2019-01-30 LAB — TSH: TSH: 2.1 u[IU]/mL (ref 0.35–4.50)

## 2019-01-30 NOTE — Telephone Encounter (Signed)
Faxed record release for patient to get Dexa scan from GYN

## 2019-01-30 NOTE — Progress Notes (Signed)
Patient ID: [name] [name], female  DOB: [birth year], 58 y.o.   MRN: [id number] Patient Care Team    Relationship Specialty Notifications Start End  Ma Hillock, DO PCP - General Family Medicine  07/16/15   Excell Seltzer, MD Consulting Physician General Surgery  08/10/15   Magrinat, Virgie Dad, MD Consulting Physician Oncology  08/10/15   [name], MD Consulting Physician Obstetrics and Gynecology  08/10/15   Armbruster, Carlota Raspberry, MD Consulting Physician Gastroenterology  10/30/18     Chief Complaint  Patient presents with   Annual Exam    fasting but would like the nurse at her work to draw her blood. she would just need the order. mamm 08/2018  cant remember about her dexa scan. colonoscopy 11/2017. pap 10/2017 and doesnt need another one she stated. does not want a flu shot. wants to know about the shingles shot    Subjective:  [name] [name] is a 58 y.o.  Female  present for CPE. All past medical history, surgical history, allergies, family history, immunizations, medications and social history were updated in the electronic medical record today. All recent labs, ED visits and hospitalizations within the last year were reviewed.  Health maintenance:  Colonoscopy: completed 2019. Dr. Havery Moros Mammogram: 5/42020; yearly. Left Breast cancer 2015. Tamoxifen use (started 03/2014).  Cervical cancer screening:Dr. Julien Girt, [facility], 10/2014. UTD/hysterectomy for fibroids. Immunizations: Declines Flu,  Tdap completed today 2017. Infectious disease screening: HIV and Hep C completed today DEXA: completed at [facility] recently.  Osteopenia. Taking Vit D and Ca use. Estrogen deficiency. Current Tamoxifen use.  Assistive device: none Oxygen [id number] Patient has a Dental home. Hospitalizations/ED visits: reviewed  Depression screen [facility] 2/9 01/30/2019 11/12/2015 08/10/2015 03/23/2014  Decreased Interest 0 0 0 0  Down, Depressed, Hopeless 0 0 0 0  PHQ - 2 Score 0 0 0 0   No flowsheet data  found.   Immunization History  Administered Date(s) Administered   Influenza-Unspecified 06/04/2017   Tdap 11/12/2015   Past Medical History:  Diagnosis Date   Anxiety disorder    Mild: treated by her gynecologist with selective serotonin reuptake inhibitor   Arthritis    hands and feet   Breast cancer ([place]) 09/09/2013   Family history of malignant neoplasm of breast    Fibroid uterus    hysterctomy   GERD (gastroesophageal reflux disease)    Hot flashes    Hyperlipidemia    Malignant neoplasm of breast (female), unspecified site 08/07/13   left breast invasive mammary ca in situ   Nonspecific abnormal electrocardiogram (ECG) (EKG) 2009   Osteoarthritis    Palpitations 2009   Improved; Secondary to premature ventricular contractions. No problems since-pt states was caring for ailing father, anxiety   Personal history of radiation therapy    left breast 2015   Thyroid disease    Tick bite of right thigh 2016   Wears glasses    No Known Allergies Past Surgical History:  Procedure Laterality Date   ABDOMINAL HYSTERECTOMY  02/07/2011   Procedure: HYSTERECTOMY ABDOMINAL;  Surgeon: [name];  Location: [facility];  Service: Gynecology;  Laterality: N/A;   BREAST EXCISIONAL BIOPSY Right 10/07/2014   high risk   BREAST EXCISIONAL BIOPSY Left 12/24/2015   benign   BREAST LUMPECTOMY Left 09/09/2013   left breast 2015   BREAST LUMPECTOMY WITH NEEDLE LOCALIZATION AND AXILLARY LYMPH NODE DISSECTION Left 09/09/2013   Procedure: LEFT BREAST LUMPECTOMY WITH NEEDLE LOCALIZATION AND LEFT AXILLARY LYMPH NODE DISSECTION;  Surgeon: [name], MD;  Location: [facility];  Service: General;  Laterality: Left;   BREAST LUMPECTOMY WITH RADIOACTIVE SEED LOCALIZATION Right 10/07/2014   Procedure: RIGHT BREAST LUMPECTOMY WITH RADIOACTIVE SEED LOCALIZATION;  Surgeon: Excell Seltzer, MD;  Location: [facility];  Service: General;   Laterality: Right;   BREAST LUMPECTOMY WITH RADIOACTIVE SEED LOCALIZATION Left 12/24/2015   Procedure: LEFT BREAST LUMPECTOMY WITH RADIOACTIVE SEED LOCALIZATION;  Surgeon: Excell Seltzer, MD;  Location: [place];  Service: General;  Laterality: Left;   CESAREAN SECTION     DILATION AND CURETTAGE OF UTERUS     ENDOMETRIAL ABLATION  2006   LAPAROSCOPIC ASSISTED VAGINAL HYSTERECTOMY  02/07/2011   Procedure: LAPAROSCOPIC ASSISTED VAGINAL HYSTERECTOMY;  Surgeon: [name];  Location: [facility];  Service: Gynecology;  Laterality: N/A;   PORTACATH PLACEMENT Right 09/29/2013   Procedure: INSERTION PORT-A-CATH;  Surgeon: [name], MD;  Location: [facility];  Service: General;  Laterality: Right;   THYROIDECTOMY, PARTIAL     Family History  Problem Relation Age of Onset   Angina Father    Aortic aneurysm Father    Dementia Father    CAD Father    Hypertension Other    Arrhythmia Other        Uncle- a fib   Breast cancer Mother        dx 63s. Died at 1   Non-Hodgkin's lymphoma Mother    Arrhythmia Maternal Aunt        A fib   Breast cancer Maternal Aunt 56       currently 15   Thyroid disease Maternal Aunt    Arrhythmia Maternal Aunt        A fib   Breast cancer Maternal Aunt        dx 35s; currently 59s   Colon cancer Maternal Grandmother        dx 44s; died at 37   Thyroid disease Maternal Grandmother    Cancer Paternal Uncle        unknown primary in early 38s   Breast cancer Maternal Aunt        dx 88s; died in 25s   Thyroid disease Maternal Uncle    Rectal cancer Paternal Grandmother    Stomach cancer Neg Hx    Esophageal cancer Neg Hx    Social History   Social History Narrative   Married, [name] [name]. 2 children [name] [name] and [name] [name])   HS graduation; [employer] employee (desk job)   Nonsmoker, 3-5 drinks weekly of etoh,  Denies drugs.    Drinks caffeine beverages   Wears seatbelt, smoke detector in the  home   Firearms in the home   Exercises >3 x week.    Feels safe in relationships.     Allergies as of 01/30/2019   No Known Allergies     Medication List       Accurate as of January 30, 2019 10:01 AM. If you have any questions, ask your nurse or doctor.        STOP taking these medications   OVER THE COUNTER MEDICATION Stopped by: Howard Pouch, DO     TAKE these medications   tamoxifen 20 MG tablet Commonly known as: NOLVADEX Take 1 tablet (20 mg total) by mouth daily.   Vitamin D 50 MCG (2000 UT) tablet Take 2,000 Units by mouth daily.       All past medical history, surgical history, allergies, family history, immunizations  andmedications were updated in the EMR today and reviewed under the history and medication portions of their EMR.     No results found for this or any previous visit (from the past 2160 hour(s)).  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 Annual evaluation. EXAM: DIGITAL DIAGNOSTIC BILATERAL MAMMOGRAM WITH CAD AND TOMO COMPARISON:  Previous exam(s). ACR Breast Density Category b: There are scattered areas of fibroglandular density. FINDINGS: Tomosynthesis and synthesized full field CC and MLO views of both breasts were obtained. Standard spot magnification MLO view of the lumpectomy site in the LEFT breast was also obtained. Post surgical scar/architectural distortion at the lumpectomy site in the OUTER LEFT breast at MIDDLE to POSTERIOR depth. Benign oil cyst adjacent to the lumpectomy site, unchanged. No new or suspicious findings in the LEFT breast. Stable postsurgical changes involving the UPPER INNER RIGHT breast. No new or suspicious findings the RIGHT breast. Mammographic images were  processed with CAD. IMPRESSION: 1. No mammographic evidence of malignancy involving either breast. 2. Expected post lumpectomy changes involving the LEFT breast and expected post surgical changes involving RIGHT breast. RECOMMENDATION: As the patient is now 5 years out from her lumpectomy, she may return to annual screening. Screening mammogram in one year is recommended.(Code:SM-B-01Y) I have discussed the findings and recommendations with the patient. If applicable, a reminder letter will be sent to the patient regarding the next appointment. BI-RADS CATEGORY  2: Benign. Electronically Signed   By: Evangeline Dakin M.D.   On: 09/24/2018 16:05     ROS: 14 pt review of systems performed and negative (unless mentioned in an HPI)  Objective: BP 109/75 (BP Location: Right Arm, Patient Position: Sitting, Cuff Size: Normal)    Pulse 90    Temp 97.7 F (36.5 C) (Temporal)    Resp 16    Ht 5' 7.13" (1.705 m)    Wt 156 lb 4 oz (70.9 kg)    SpO2 100%    BMI 24.38 kg/m  Gen: Afebrile. No acute distress. Nontoxic in appearance, well-developed, well-nourished, pleasant Caucasian female. HENT: AT. [place]. Bilateral TM visualized and normal in appearance, normal external auditory canal. MMM, no oral lesions, adequate dentition. Bilateral nares within normal limits. Throat without erythema, ulcerations or exudates.  No cough on exam, no hoarseness on exam. Eyes:Pupils Equal Round Reactive to light, Extraocular movements intact,  Conjunctiva without redness, discharge or icterus. Neck/lymp/endocrine: Supple, no lymphadenopathy, mild thyromegaly CV: RRR no murmur, no edema, +2/4 P posterior tibialis pulses.  No carotid bruits. No JVD. Chest: CTAB, no wheeze, rhonchi or crackles.  Normal respiratory effort.  Good air movement. Abd: Soft.  Flat. NTND. BS present.  No masses palpated. No hepatosplenomegaly. No rebound tenderness or guarding. Skin: No rashes, purpura or petechiae. Warm and well-perfused. Skin  intact. Neuro/Msk:  Normal gait. PERLA. EOMi. Alert. Oriented x3.  Cranial nerves II through XII intact. Muscle strength 5/5 upper/lower extremity. DTRs equal bilaterally. Psych: Normal affect, dress and demeanor. Normal speech. Normal thought content and judgment.  No exam data present  Assessment/plan: Joesphine BEATRYCE SISTRUNK is a 58 y.o. female present for CPE Elevated ALT measurement/Drug-induced neutropenia ([place]) Tamoxifen use.  - Comprehensive metabolic panel - CBC with Differential/Platelet Thyroid fullness - TSH - T4, free Diabetes mellitus screening -  Hemoglobin A1c Lipid screening Not fasting - Lipid panel Vitamin D deficiency/Osteopenia, unspecified location - currently taking supplement.  - Vitamin D (25 hydroxy) Encounter for preventive health examination Patient was encouraged to exercise greater than 150 minutes a week. Patient was encouraged to choose a diet filled with fresh fruits and vegetables, and lean meats. AVS provided to patient today for education/recommendation on gender specific health and safety maintenance. Colonoscopy: completed 2019. Dr. Havery Moros Mammogram: 5/42020; yearly. Left Breast cancer 2015. Tamoxifen use (started 03/2014).  Cervical cancer screening:Dr. Julien Girt, [facility], 10/2014. UTD/hysterectomy for fibroids. Immunizations: Declines Flu,  Tdap completed today 2017. Infectious disease screening: HIV and Hep C completed today DEXA: completed at [facility] recently.  Osteopenia. Taking Vit D and Ca use. Estrogen deficiency. Current Tamoxifen use.    Return in about 1 year (around 01/30/2020) for CPE (30 min).  Electronically signed by: Howard Pouch, DO [place]

## 2019-01-30 NOTE — Patient Instructions (Signed)
Health Maintenance, Female Adopting a healthy lifestyle and getting preventive care are important in promoting health and wellness. Ask your health care provider about:  The right schedule for you to have regular tests and exams.  Things you can do on your own to prevent diseases and keep yourself healthy. What should I know about diet, weight, and exercise? Eat a healthy diet   Eat a diet that includes plenty of vegetables, fruits, low-fat dairy products, and lean protein.  Do not eat a lot of foods that are high in solid fats, added sugars, or sodium. Maintain a healthy weight Body mass index (BMI) is used to identify weight problems. It estimates body fat based on height and weight. Your health care provider can help determine your BMI and help you achieve or maintain a healthy weight. Get regular exercise Get regular exercise. This is one of the most important things you can do for your health. Most adults should:  Exercise for at least 150 minutes each week. The exercise should increase your heart rate and make you sweat (moderate-intensity exercise).  Do strengthening exercises at least twice a week. This is in addition to the moderate-intensity exercise.  Spend less time sitting. Even light physical activity can be beneficial. Watch cholesterol and blood lipids Have your blood tested for lipids and cholesterol at 60 years of age, then have this test every 5 years. Have your cholesterol levels checked more often if:  Your lipid or cholesterol levels are high.  You are older than 60 years of age.  You are at high risk for heart disease. What should I know about cancer screening? Depending on your health history and family history, you may need to have cancer screening at various ages. This may include screening for:  Breast cancer.  Cervical cancer.  Colorectal cancer.  Skin cancer.  Lung cancer. What should I know about heart disease, diabetes, and high blood  pressure? Blood pressure and heart disease  High blood pressure causes heart disease and increases the risk of stroke. This is more likely to develop in people who have high blood pressure readings, are of African descent, or are overweight.  Have your blood pressure checked: ? Every 3-5 years if you are 18-39 years of age. ? Every year if you are 40 years old or older. Diabetes Have regular diabetes screenings. This checks your fasting blood sugar level. Have the screening done:  Once every three years after age 40 if you are at a normal weight and have a low risk for diabetes.  More often and at a younger age if you are overweight or have a high risk for diabetes. What should I know about preventing infection? Hepatitis B If you have a higher risk for hepatitis B, you should be screened for this virus. Talk with your health care provider to find out if you are at risk for hepatitis B infection. Hepatitis C Testing is recommended for:  Everyone born from 1945 through 1965.  Anyone with known risk factors for hepatitis C. Sexually transmitted infections (STIs)  Get screened for STIs, including gonorrhea and chlamydia, if: ? You are sexually active and are younger than 60 years of age. ? You are older than 60 years of age and your health care provider tells you that you are at risk for this type of infection. ? Your sexual activity has changed since you were last screened, and you are at increased risk for chlamydia or gonorrhea. Ask your health care provider if   you are at risk.  Ask your health care provider about whether you are at high risk for HIV. Your health care provider may recommend a prescription medicine to help prevent HIV infection. If you choose to take medicine to prevent HIV, you should first get tested for HIV. You should then be tested every 3 months for as long as you are taking the medicine. Pregnancy  If you are about to stop having your period (premenopausal) and  you may become pregnant, seek counseling before you get pregnant.  Take 400 to 800 micrograms (mcg) of folic acid every day if you become pregnant.  Ask for birth control (contraception) if you want to prevent pregnancy. Osteoporosis and menopause Osteoporosis is a disease in which the bones lose minerals and strength with aging. This can result in bone fractures. If you are 65 years old or older, or if you are at risk for osteoporosis and fractures, ask your health care provider if you should:  Be screened for bone loss.  Take a calcium or vitamin D supplement to lower your risk of fractures.  Be given hormone replacement therapy (HRT) to treat symptoms of menopause. Follow these instructions at home: Lifestyle  Do not use any products that contain nicotine or tobacco, such as cigarettes, e-cigarettes, and chewing tobacco. If you need help quitting, ask your health care provider.  Do not use street drugs.  Do not share needles.  Ask your health care provider for help if you need support or information about quitting drugs. Alcohol use  Do not drink alcohol if: ? Your health care provider tells you not to drink. ? You are pregnant, may be pregnant, or are planning to become pregnant.  If you drink alcohol: ? Limit how much you use to 0-1 drink a day. ? Limit intake if you are breastfeeding.  Be aware of how much alcohol is in your drink. In the [place] one drink equals one 12 oz bottle of beer (355 mL), one 5 oz glass of wine (148 mL), or one 1 oz glass of hard liquor (44 mL). General instructions  Schedule regular health, dental, and eye exams.  Stay current with your vaccines.  Tell your health care provider if: ? You often feel depressed. ? You have ever been abused or do not feel safe at home. Summary  Adopting a healthy lifestyle and getting preventive care are important in promoting health and wellness.  Follow your health care provider's instructions about healthy  diet, exercising, and getting tested or screened for diseases.  Follow your health care provider's instructions on monitoring your cholesterol and blood pressure. This information is not intended to replace advice given to you by your health care provider. Make sure you discuss any questions you have with your health care provider. Document Released: 10/31/2010 Document Revised: 04/10/2018 Document Reviewed: 04/10/2018 Elsevier Patient Education  2020 [employer] Inc[employer]  

## 2019-01-31 ENCOUNTER — Telehealth: Payer: Self-pay | Admitting: Family Medicine

## 2019-01-31 NOTE — Telephone Encounter (Signed)
Please inform patient the following information: Labs are all normal with the exception of mild anemia (low blood count/hemoglobin). This is new for her. It could possibly be related to her tamoxifen, if she has not noticed bleeding from other sources such as bowel/stool or surgery etc.  hgb 11.7 (NL range 12- she is typically around 13) Hematocrit 34.6 (NL range 36- she is normally about 38) - I would recommend she discuss with her oncologist since it is a new finding for her. I have sent Dr. Jana Hakim a copy of those labs.

## 2019-01-31 NOTE — Telephone Encounter (Signed)
Called patient and verbalized understanding

## 2019-02-03 ENCOUNTER — Encounter: Payer: Self-pay | Admitting: Family Medicine

## 2019-02-05 ENCOUNTER — Encounter: Payer: Self-pay | Admitting: Oncology

## 2019-02-06 ENCOUNTER — Encounter: Payer: Self-pay | Admitting: *Deleted

## 2019-02-10 ENCOUNTER — Other Ambulatory Visit: Payer: Self-pay | Admitting: *Deleted

## 2019-02-10 DIAGNOSIS — D630 Anemia in neoplastic disease: Secondary | ICD-10-CM

## 2019-02-11 ENCOUNTER — Telehealth: Payer: Self-pay | Admitting: Oncology

## 2019-02-11 NOTE — Telephone Encounter (Signed)
Called pt per 10/12 sch message - no appt scheduled at the moment - sent message to RN - pt states she's been in contact with someone who tested positive with covid

## 2019-02-14 ENCOUNTER — Telehealth: Payer: Self-pay | Admitting: Oncology

## 2019-02-14 ENCOUNTER — Encounter: Payer: Self-pay | Admitting: Oncology

## 2019-02-14 NOTE — Telephone Encounter (Signed)
Per 10/16 schedule message move currently lab (July 2021) to 02/19/19. Follow up for July 2021 remains as scheduled. Confirmed with patient.

## 2019-02-14 NOTE — Telephone Encounter (Signed)
Returned patient's phone call regarding scheduling an appointment, informed patient I will give her a call back once I get further instructions from providers nurse.

## 2019-02-19 ENCOUNTER — Other Ambulatory Visit: Payer: Self-pay

## 2019-02-19 ENCOUNTER — Inpatient Hospital Stay: Payer: Commercial Managed Care - PPO | Attending: Oncology

## 2019-02-19 DIAGNOSIS — D630 Anemia in neoplastic disease: Secondary | ICD-10-CM | POA: Diagnosis not present

## 2019-02-19 DIAGNOSIS — C50212 Malignant neoplasm of upper-inner quadrant of left female breast: Secondary | ICD-10-CM | POA: Insufficient documentation

## 2019-02-19 LAB — RETICULOCYTES
Immature Retic Fract: 12.6 % (ref 2.3–15.9)
RBC.: 3.73 MIL/uL — ABNORMAL LOW (ref 3.87–5.11)
Retic Count, Absolute: 63.8 10*3/uL (ref 19.0–186.0)
Retic Ct Pct: 1.7 % (ref 0.4–3.1)

## 2019-02-19 LAB — CBC WITH DIFFERENTIAL (CANCER CENTER ONLY)
Abs Immature Granulocytes: 0.02 10*3/uL (ref 0.00–0.07)
Basophils Absolute: 0 10*3/uL (ref 0.0–0.1)
Basophils Relative: 0 %
Eosinophils Absolute: 0.1 10*3/uL (ref 0.0–0.5)
Eosinophils Relative: 2 %
HCT: 34.8 % — ABNORMAL LOW (ref 36.0–46.0)
Hemoglobin: 11.6 g/dL — ABNORMAL LOW (ref 12.0–15.0)
Immature Granulocytes: 0 %
Lymphocytes Relative: 13 %
Lymphs Abs: 0.6 10*3/uL — ABNORMAL LOW (ref 0.7–4.0)
MCH: 30.9 pg (ref 26.0–34.0)
MCHC: 33.3 g/dL (ref 30.0–36.0)
MCV: 92.8 fL (ref 80.0–100.0)
Monocytes Absolute: 0.4 10*3/uL (ref 0.1–1.0)
Monocytes Relative: 9 %
Neutro Abs: 3.6 10*3/uL (ref 1.7–7.7)
Neutrophils Relative %: 76 %
Platelet Count: 211 10*3/uL (ref 150–400)
RBC: 3.75 MIL/uL — ABNORMAL LOW (ref 3.87–5.11)
RDW: 12.8 % (ref 11.5–15.5)
WBC Count: 4.7 10*3/uL (ref 4.0–10.5)
nRBC: 0 % (ref 0.0–0.2)

## 2019-02-19 LAB — FERRITIN: Ferritin: 20 ng/mL (ref 11–307)

## 2019-02-19 LAB — CMP (CANCER CENTER ONLY)
ALT: 26 U/L (ref 0–44)
AST: 22 U/L (ref 15–41)
Albumin: 3.7 g/dL (ref 3.5–5.0)
Alkaline Phosphatase: 55 U/L (ref 38–126)
Anion gap: 11 (ref 5–15)
BUN: 14 mg/dL (ref 6–20)
CO2: 24 mmol/L (ref 22–32)
Calcium: 8.3 mg/dL — ABNORMAL LOW (ref 8.9–10.3)
Chloride: 105 mmol/L (ref 98–111)
Creatinine: 0.85 mg/dL (ref 0.44–1.00)
GFR, Est AFR Am: >60 mL/min (ref >60)
GFR, Estimated: >60 mL/min (ref >60)
Glucose, Bld: 107 mg/dL — ABNORMAL HIGH (ref 70–99)
Potassium: 3.6 mmol/L (ref 3.5–5.1)
Sodium: 140 mmol/L (ref 135–145)
Total Bilirubin: 0.4 mg/dL (ref 0.3–1.2)
Total Protein: 6.7 g/dL (ref 6.5–8.1)

## 2019-03-13 ENCOUNTER — Encounter: Payer: Self-pay | Admitting: Oncology

## 2019-05-06 ENCOUNTER — Encounter: Payer: Self-pay | Admitting: Oncology

## 2019-07-01 ENCOUNTER — Other Ambulatory Visit: Payer: Self-pay | Admitting: Oncology

## 2019-07-01 DIAGNOSIS — Z1231 Encounter for screening mammogram for malignant neoplasm of breast: Secondary | ICD-10-CM

## 2019-07-29 ENCOUNTER — Encounter: Payer: Self-pay | Admitting: Oncology

## 2019-08-25 ENCOUNTER — Telehealth: Payer: Self-pay | Admitting: Oncology

## 2019-08-25 NOTE — Telephone Encounter (Signed)
Rescheduled 7/1 appt per provider PAL. Left voicemail with new appt date and time.

## 2019-10-06 ENCOUNTER — Other Ambulatory Visit: Payer: Self-pay

## 2019-10-06 ENCOUNTER — Ambulatory Visit
Admission: RE | Admit: 2019-10-06 | Discharge: 2019-10-06 | Disposition: A | Discharge status: Home / Self Care | Payer: Commercial Managed Care - PPO | Source: Ambulatory Visit | Attending: Oncology | Admitting: Oncology

## 2019-10-06 DIAGNOSIS — Z1231 Encounter for screening mammogram for malignant neoplasm of breast: Secondary | ICD-10-CM

## 2019-10-30 ENCOUNTER — Other Ambulatory Visit: Payer: Commercial Managed Care - PPO

## 2019-10-30 ENCOUNTER — Ambulatory Visit: Payer: Commercial Managed Care - PPO | Admitting: Oncology

## 2019-10-30 ENCOUNTER — Inpatient Hospital Stay: Payer: Commercial Managed Care - PPO | Admitting: Oncology

## 2019-11-10 NOTE — Progress Notes (Signed)
[place]  [phone] [phone]) [phone]     ID: [name] [name] OB: 10-Nov-1960  MR#: [id number]  HQI#:[id number]  Patient Care Team: Ma Hillock, DO as PCP - General (Family Medicine) Kotaro Buer, Virgie Dad, MD as Consulting Physician (Oncology) [name], MD as Consulting Physician (Obstetrics and Gynecology) Havery Moros, Carlota Raspberry, MD as Consulting Physician (Gastroenterology) OTHER MD: [name]   CHIEF COMPLAINT: Locally advanced breast cancer   CURRENT TREATMENT: Completed 5 years of tamoxifen   INTERVAL HISTORY: [name] [name] returns today for follow-up and treatment of her estrogen receptor positive breast cancer. She was last seen here on 10/30/2018.   She went off tamoxifen April 2021.  She did not notice a change after going off except she gained about 6 pounds.  She tells me she is still exercising and having the same diet so this is administered to her.  Since her last visit here, she underwent a digital screening bilateral mammogram with tomography on 10/06/2019 revealing no mammographic evidence of malignancy.     REVIEW OF SYSTEMS: [name] [name] walks for exercise.  She is trying to get back into the gym.  She received a Materna vaccine x2 with some mild side effects that lasted about a day each time.  She also thinks she probably had Covid anyway, and mild case, prior to that.  Aside from these issues a detailed review of systems today was stable    BREAST CANCER HISTORY: From the original intake note  "[name] [name] is 59 years old and she first noted a change, like a small hard pea in her left axilla. She brought this to Dr. [name] attention and bilateral diagnostic mammography and left ultrasonography at the [facility] Center for 07/18/2013 showed a small area of asymmetry in the inner left breast measuring perhaps 5 mm which was not palpable by physical exam. In the right breast there was some loosely grouped calcifications spanning up to 0.7 cm.  Ultrasound of the left breast confirmed an ill-defined area of shadowing at the 9:00 position measuring up to 1.3 cm. There was also a 4 mm circumscribed hypoechoic mass at the 10:00 position. This is felt to be probably benign. In the left axilla there were 2 lymph nodes with slightly increased cortical thickening, although the fatty hila were maintained. These were not related to the patient's feeling of a "pea like mass" in her left axilla, which had by then pretty much resolved.   On 08/07/2013 the patient underwent left breast needle core biopsy and this showed ([id number]) and invasive lobular carcinoma (E-cadherin negative) estrogen receptor 70% positive with strong staining intensity, progesterone receptor 70% positive and strong staining intensity, with a proliferation marker of 7% and no HER-2 amplification, the signals ratio being 0.94 in the number per cell 1.50.  Bilateral breast MRI 08/15/2013 found an irregular spiculated mass in the left breast measuring approximately 1.5 cm. There was no abnormal enhancement of the pectoralis. There were at least 2 left axillary lymph nodes and showed focal cortical thickening. There was a single left internal mammary lymph node at the level of the biopsy-proven malignancy which was not pathologic in appearance. The right breast and axilla were benign.  Genetics testing may 2015 was normal and did not reveal a mutation in the genes tested: ATM, BARD1, BRCA1, BRCA2, BRIP1, CDH1, CHEK2, MRE11A, MUTYH, NBN, NF1, PALB2, PTEN, RAD50, RAD51C, RAD51D, and TP53. She is status post left lumpectomy and axillary lymph node dissection by Dr Excell Seltzer 09/09/2013 for a pT1c pN2,  stage IIIa invasive lobular carcinoma, grade 1, repeat HER-2 again negative. Patient had 8/23 LN positive and also evidence of LVI (lymphovascular invasion) and PNI (perineural invasion)."  Her subsequent history is as detailed below    PAST MEDICAL HISTORY: Past Medical History:  Diagnosis  Date   Anxiety disorder    Mild: treated by her gynecologist with selective serotonin reuptake inhibitor   Arthritis    hands and feet   Breast cancer ([place]) 09/09/2013   Family history of malignant neoplasm of breast    Fibroid uterus    hysterctomy   GERD (gastroesophageal reflux disease)    Hot flashes    Hyperlipidemia    Malignant neoplasm of breast (female), unspecified site 08/07/13   left breast invasive mammary ca in situ   Nonspecific abnormal electrocardiogram (ECG) (EKG) 2009   Osteoarthritis    Palpitations 2009   Improved; Secondary to premature ventricular contractions. No problems since-pt states was caring for ailing father, anxiety   Personal history of radiation therapy    left breast 2015   Thyroid disease    Tick bite of right thigh 2016   Wears glasses     PAST SURGICAL HISTORY: Past Surgical History:  Procedure Laterality Date   ABDOMINAL HYSTERECTOMY  02/07/2011   Procedure: HYSTERECTOMY ABDOMINAL;  Surgeon: [name];  Location: [facility];  Service: Gynecology;  Laterality: N/A;   BREAST EXCISIONAL BIOPSY Right 10/07/2014   high risk   BREAST EXCISIONAL BIOPSY Left 12/24/2015   benign   BREAST LUMPECTOMY Left 09/09/2013   left breast 2015   BREAST LUMPECTOMY WITH NEEDLE LOCALIZATION AND AXILLARY LYMPH NODE DISSECTION Left 09/09/2013   Procedure: LEFT BREAST LUMPECTOMY WITH NEEDLE LOCALIZATION AND LEFT AXILLARY LYMPH NODE DISSECTION;  Surgeon: [name], MD;  Location: [facility];  Service: General;  Laterality: Left;   BREAST LUMPECTOMY WITH RADIOACTIVE SEED LOCALIZATION Right 10/07/2014   Procedure: RIGHT BREAST LUMPECTOMY WITH RADIOACTIVE SEED LOCALIZATION;  Surgeon: Excell Seltzer, MD;  Location: [facility];  Service: General;  Laterality: Right;   BREAST LUMPECTOMY WITH RADIOACTIVE SEED LOCALIZATION Left 12/24/2015   Procedure: LEFT BREAST LUMPECTOMY WITH RADIOACTIVE SEED LOCALIZATION;   Surgeon: Excell Seltzer, MD;  Location: [facility];  Service: General;  Laterality: Left;   CESAREAN SECTION     DILATION AND CURETTAGE OF UTERUS     ENDOMETRIAL ABLATION  2006   LAPAROSCOPIC ASSISTED VAGINAL HYSTERECTOMY  02/07/2011   Procedure: LAPAROSCOPIC ASSISTED VAGINAL HYSTERECTOMY;  Surgeon: [name];  Location: [facility];  Service: Gynecology;  Laterality: N/A;   PORTACATH PLACEMENT Right 09/29/2013   Procedure: INSERTION PORT-A-CATH;  Surgeon: [name], MD;  Location: [facility];  Service: General;  Laterality: Right;   THYROIDECTOMY, PARTIAL      FAMILY HISTORY Family History  Problem Relation Age of Onset   Angina Father    Aortic aneurysm Father    Dementia Father    CAD Father    Hypertension Other    Arrhythmia Other        Uncle- a fib   Breast cancer Mother        dx 33s. Died at 60   Non-Hodgkin's lymphoma Mother    Arrhythmia Maternal Aunt        A fib   Breast cancer Maternal Aunt 56       currently 26   Thyroid disease Maternal Aunt    Arrhythmia Maternal Aunt        A  fib   Breast cancer Maternal Aunt        dx 57s; currently 6s   Colon cancer Maternal Grandmother        dx 26s; died at 58   Thyroid disease Maternal Grandmother    Cancer Paternal Uncle        unknown primary in early 59s   Breast cancer Maternal Aunt        dx 74s; died in 63s   Thyroid disease Maternal Uncle    Rectal cancer Paternal Grandmother    Stomach cancer Neg Hx    Esophageal cancer Neg Hx   The patient's father died from heart disease in the setting of dementia at the age of 63. The patient's mother died at the age of 26. She had been diagnosed with breast cancer at the age of 50. The patient is a single child. Her mother had 4 sisters, 3 of whom had breast cancer diagnosed at the age of 45, 46, and 43. There is also history of colorectal cancer on the maternal side of the family. BRCA testing  negative.   GYNECOLOGIC HISTORY:   menarche age 19, first live birth age 33, the patient is [place] P2. She underwent a hysterectomy without salpingo-oophorectomy in 2012. 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 Her husband [name] also worked for [employer], running [employer], but retired in 2018.Marland Kitchen Son [name] lives in [place] where he works for the credit union and his wife works in out reach for [employer].. Daughter Darrick Penna lives in [place], where she works as an Engineering geologist in a [employer]. The patient has no grandchildren. She is a Information systems manager.   ADVANCED DIRECTIVES: Not in place    HEALTH MAINTENANCE: Social History   Tobacco Use   Smoking status: Never Smoker   Smokeless tobacco: Never Used  Vaping Use   Vaping Use: Never used  Substance Use Topics   Alcohol use: Yes    Alcohol/week: 2.0 standard drinks    Types: 2 Glasses of wine per week   Drug use: Not Currently    Comment: smoked marijuana last month     Colonoscopy: 12/07/2017-[facility]/benign polyp/repeat in 10 years  PAP:2012, s/p hysterectomy.  Bone density:2012   Lipid panel: 11/05/2013 TC 219, HDL 41, LDL 137, TG 204  No Known Allergies  Current Outpatient Medications  Medication Sig Dispense Refill   Cholecalciferol (VITAMIN D) 2000 UNITS tablet Take 2,000 Units by mouth daily.     tamoxifen (NOLVADEX) 20 MG tablet Take 1 tablet (20 mg total) by mouth daily. 90 tablet 4   No current facility-administered medications for this visit.    OBJECTIVE:  white woman who appears younger than stated age  59:   11/11/19 1318  BP: 139/85  Pulse: 95  Resp: 20  Temp: 98.7 F (37.1 C)  SpO2: 100%   Wt Readings from Last 3 Encounters:  11/11/19 161 lb 1.6 oz (73.1 kg)  01/30/19 156 lb 4 oz (70.9 kg)   10/30/18 156 lb 4.8 oz (70.9 kg)   Body mass index is 25.13 kg/m.    ECOG FS:1 - Symptomatic but completely ambulatory  Ocular: Sclerae unicteric, pupils round and equal Ear-nose-throat: Wearing a mask Lymphatic: No cervical or supraclavicular adenopathy Lungs no rales or rhonchi Heart regular rate and rhythm Abd  soft, nontender, positive bowel sounds MSK no focal spinal tenderness, no joint edema Neuro: non-focal, well-oriented, appropriate affect Breasts: The right breast is benign.  The left breast is status post lumpectomy and radiation.  There is no evidence of disease recurrence.  Both axillae are benign.  LAB RESULTS:  CMP     Component Value Date/Time   NA 140 02/19/2019 1148   NA 142 10/26/2016 1141   K 3.6 02/19/2019 1148   K 3.8 10/26/2016 1141   CL 105 02/19/2019 1148   CO2 24 02/19/2019 1148   CO2 24 10/26/2016 1141   GLUCOSE 107 (H) 02/19/2019 1148   GLUCOSE 122 10/26/2016 1141   BUN 14 02/19/2019 1148   BUN 13.2 10/26/2016 1141   CREATININE 0.85 02/19/2019 1148   CREATININE 0.9 10/26/2016 1141   CALCIUM 8.3 (L) 02/19/2019 1148   CALCIUM 9.1 10/26/2016 1141   PROT 6.7 02/19/2019 1148   PROT 6.8 10/26/2016 1141   ALBUMIN 3.7 02/19/2019 1148   ALBUMIN 3.7 10/26/2016 1141   AST 22 02/19/2019 1148   AST 20 10/26/2016 1141   ALT 26 02/19/2019 1148   ALT 26 10/26/2016 1141   ALKPHOS 55 02/19/2019 1148   ALKPHOS 72 10/26/2016 1141   BILITOT 0.4 02/19/2019 1148   BILITOT 0.28 10/26/2016 1141   GFRNONAA >60 02/19/2019 1148   GFRAA >60 02/19/2019 1148   I No results found for: SPEP  Lab Results  Component Value Date   WBC 4.7 02/19/2019   NEUTROABS 3.6 02/19/2019   HGB 11.6 (L) 02/19/2019   HCT 34.8 (L) 02/19/2019   MCV 92.8 02/19/2019   PLT 211 02/19/2019      Chemistry      Component Value Date/Time   NA 140 02/19/2019 1148   NA 142 10/26/2016 1141   K 3.6 02/19/2019 1148   K 3.8 10/26/2016 1141   CL 105 02/19/2019 1148   CO2 24  02/19/2019 1148   CO2 24 10/26/2016 1141   BUN 14 02/19/2019 1148   BUN 13.2 10/26/2016 1141   CREATININE 0.85 02/19/2019 1148   CREATININE 0.9 10/26/2016 1141      Component Value Date/Time   CALCIUM 8.3 (L) 02/19/2019 1148   CALCIUM 9.1 10/26/2016 1141   ALKPHOS 55 02/19/2019 1148   ALKPHOS 72 10/26/2016 1141   AST 22 02/19/2019 1148   AST 20 10/26/2016 1141   ALT 26 02/19/2019 1148   ALT 26 10/26/2016 1141   BILITOT 0.4 02/19/2019 1148   BILITOT 0.28 10/26/2016 1141      STUDIES: No results found.   ASSESSMENT: 59 y.o. BRCA negative [place] woman status post left breast upper inner quadrant biopsy for 01/18/2014 showing invasive lobular carcinoma (E-cadherin negative), estrogen receptor 70% positive, progesterone receptor 70% positive, with an MIB-1 of 7%, and no HER-2 amplification  (1) ultrasound-guided biopsy of a suspicious lymph node in the left axilla 08/19/2012 was positive  (2) status post left lumpectomy and axillary lymph node dissection 09/09/2013 for a pT1c pN2, stage IIIa invasive lobular carcinoma, grade 1, repeat HER-2 again negative. [She had 8 of 23 axillary lymph nodes involved]  (4) Adjuvant chemotherapy consisting of doxorubicin and cyclophosphamide in dose dense fashion x4 completed 12/18/2013;followed by paclitaxel weekly x 12 completed 03/20/2014  (5) radiation completed in [facility] 07/01/2014  (6) started tamoxifen 08/01/2014  (a) the patient is status post simple hysterectomy, no salpingo-oophorectomy  (b) DEXA scan at [facility] internal [facility] 06/30/2014 showed a T score of -1.0 (normal)  (7) hot  flashes - gabapentin 311m nightly, to begin venlafaxine daily.   PLAN: [name] Galais now a little over 6 years out from definitive surgery for her breast cancer with no evidence of disease recurrence.  This is very favorable.  She completed 5 years of tamoxifen.  We discussed the option of continuing tamoxifen additional 2 years or switching to an aromatase  inhibitor for an additional 2 years.  Either of those strategies would give her a few more percentage decrease in the risk of recurrence.  She is very clear she is comfortable with where she is.  I am not uncomfortable with her decision to stop tamoxifen and "graduate".  All she will need in terms of breast cancer follow-up is her yearly physician breast exam and yearly mammography  I will be glad to see [name] [name] again at any point in the future if and when the need arises but as of now are making no further routine appointments for her here.  Total encounter time 20 minutes. [name] [name], GVirgie Dad MD  11/11/19 1:58 PM Medical Oncology and Hematology [facility] [address]Tel. [phone]   Fax. [phone] I, AJacqualyn Poseyam acting as a sEducation administratorfor GChauncey Cruel MD.   I, GLurline DelMD, have reviewed the above documentation for accuracy and completeness, and I agree with the above.    *Total Encounter Time as defined by the Centers for Medicare and Medicaid Services includes, in addition to the face-to-face time of a patient visit (documented in the note above) non-face-to-face time: obtaining and reviewing outside history, ordering and reviewing medications, tests or procedures, care coordination (communications with other health care professionals or caregivers) and documentation in the medical record.

## 2019-11-11 ENCOUNTER — Other Ambulatory Visit: Payer: Self-pay

## 2019-11-11 ENCOUNTER — Inpatient Hospital Stay: Payer: Commercial Managed Care - PPO | Attending: Oncology | Admitting: Oncology

## 2019-11-11 VITALS — BP 139/85 | HR 95 | Temp 98.7°F | Resp 20 | Ht 67.13 in | Wt 161.1 lb

## 2019-11-11 DIAGNOSIS — E785 Hyperlipidemia, unspecified: Secondary | ICD-10-CM | POA: Insufficient documentation

## 2019-11-11 DIAGNOSIS — Z923 Personal history of irradiation: Secondary | ICD-10-CM | POA: Diagnosis not present

## 2019-11-11 DIAGNOSIS — Z170 Estrogen receptor positive status [ER+]: Secondary | ICD-10-CM | POA: Diagnosis not present

## 2019-11-11 DIAGNOSIS — Z90710 Acquired absence of both cervix and uterus: Secondary | ICD-10-CM | POA: Diagnosis not present

## 2019-11-11 DIAGNOSIS — F419 Anxiety disorder, unspecified: Secondary | ICD-10-CM | POA: Diagnosis not present

## 2019-11-11 DIAGNOSIS — Z853 Personal history of malignant neoplasm of breast: Secondary | ICD-10-CM | POA: Diagnosis not present

## 2019-11-11 DIAGNOSIS — C50212 Malignant neoplasm of upper-inner quadrant of left female breast: Secondary | ICD-10-CM | POA: Diagnosis not present

## 2019-11-12 ENCOUNTER — Telehealth: Payer: Self-pay | Admitting: Oncology

## 2019-11-12 NOTE — Telephone Encounter (Signed)
No 7/13 los, no changes made to pt schedule 

## 2019-11-27 ENCOUNTER — Ambulatory Visit: Payer: Commercial Managed Care - PPO | Admitting: Physician Assistant

## 2020-01-12 LAB — BASIC METABOLIC PANEL
BUN: 15 (ref 4–21)
CO2: 21 (ref 13–22)
Chloride: 105 (ref 99–108)
Creatinine: 1 (ref 0.5–1.1)
Glucose: 104
Potassium: 4.6 (ref 3.4–5.3)
Sodium: 139 (ref 137–147)

## 2020-01-12 LAB — VITAMIN B12: Vitamin B-12: 397

## 2020-01-12 LAB — COMPREHENSIVE METABOLIC PANEL
Calcium: 9.1 (ref 8.7–10.7)
GFR calc Af Amer: 74
GFR calc non Af Amer: 64

## 2020-01-12 LAB — HEMOGLOBIN A1C: Hemoglobin A1C: 5.4

## 2020-01-12 LAB — IRON,TIBC AND FERRITIN PANEL: Ferritin: 45

## 2020-01-12 LAB — TSH: TSH: 2.5 (ref 0.41–5.90)

## 2020-01-12 LAB — VITAMIN D 25 HYDROXY (VIT D DEFICIENCY, FRACTURES): Vit D, 25-Hydroxy: 30.2

## 2020-03-04 LAB — HM DEXA SCAN

## 2020-03-09 ENCOUNTER — Encounter: Payer: Self-pay | Admitting: Family Medicine

## 2020-03-09 ENCOUNTER — Ambulatory Visit (INDEPENDENT_AMBULATORY_CARE_PROVIDER_SITE_OTHER): Payer: Commercial Managed Care - PPO | Admitting: Family Medicine

## 2020-03-09 ENCOUNTER — Other Ambulatory Visit: Payer: Self-pay

## 2020-03-09 VITALS — BP 136/88 | HR 77 | Temp 98.2°F | Ht 66.25 in | Wt 160.0 lb

## 2020-03-09 DIAGNOSIS — Z23 Encounter for immunization: Secondary | ICD-10-CM | POA: Diagnosis not present

## 2020-03-09 DIAGNOSIS — K219 Gastro-esophageal reflux disease without esophagitis: Secondary | ICD-10-CM | POA: Diagnosis not present

## 2020-03-09 DIAGNOSIS — E0789 Other specified disorders of thyroid: Secondary | ICD-10-CM | POA: Diagnosis not present

## 2020-03-09 DIAGNOSIS — Z131 Encounter for screening for diabetes mellitus: Secondary | ICD-10-CM | POA: Diagnosis not present

## 2020-03-09 DIAGNOSIS — R0683 Snoring: Secondary | ICD-10-CM | POA: Insufficient documentation

## 2020-03-09 DIAGNOSIS — R030 Elevated blood-pressure reading, without diagnosis of hypertension: Secondary | ICD-10-CM | POA: Insufficient documentation

## 2020-03-09 DIAGNOSIS — D630 Anemia in neoplastic disease: Secondary | ICD-10-CM | POA: Diagnosis not present

## 2020-03-09 DIAGNOSIS — Z0000 Encounter for general adult medical examination without abnormal findings: Secondary | ICD-10-CM | POA: Diagnosis not present

## 2020-03-09 DIAGNOSIS — Z13220 Encounter for screening for lipoid disorders: Secondary | ICD-10-CM | POA: Diagnosis not present

## 2020-03-09 DIAGNOSIS — R519 Headache, unspecified: Secondary | ICD-10-CM | POA: Insufficient documentation

## 2020-03-09 LAB — COMPREHENSIVE METABOLIC PANEL
ALT: 37 U/L — ABNORMAL HIGH (ref 0–35)
AST: 21 U/L (ref 0–37)
Albumin: 4.6 g/dL (ref 3.5–5.2)
Alkaline Phosphatase: 79 U/L (ref 39–117)
BUN: 16 mg/dL (ref 6–23)
CO2: 27 mEq/L (ref 19–32)
Calcium: 9.2 mg/dL (ref 8.4–10.5)
Chloride: 102 mEq/L (ref 96–112)
Creatinine, Ser: 0.69 mg/dL (ref 0.40–1.20)
GFR: 93.87 mL/min (ref >60.00)
Glucose, Bld: 91 mg/dL (ref 70–99)
Potassium: 4.4 mEq/L (ref 3.5–5.1)
Sodium: 137 mEq/L (ref 135–145)
Total Bilirubin: 0.6 mg/dL (ref 0.2–1.2)
Total Protein: 7 g/dL (ref 6.0–8.3)

## 2020-03-09 LAB — LIPID PANEL
Cholesterol: 196 mg/dL (ref 0–200)
HDL: 44.3 mg/dL (ref >39.00)
LDL Cholesterol: 119 mg/dL — ABNORMAL HIGH (ref 0–99)
NonHDL: 151.63
Total CHOL/HDL Ratio: 4
Triglycerides: 165 mg/dL — ABNORMAL HIGH (ref 0.0–149.0)
VLDL: 33 mg/dL (ref 0.0–40.0)

## 2020-03-09 LAB — CBC WITH DIFFERENTIAL/PLATELET
Basophils Absolute: 0 10*3/uL (ref 0.0–0.1)
Basophils Relative: 0.8 % (ref 0.0–3.0)
Eosinophils Absolute: 0.1 10*3/uL (ref 0.0–0.7)
Eosinophils Relative: 1.7 % (ref 0.0–5.0)
HCT: 40.5 % (ref 36.0–46.0)
Hemoglobin: 13.5 g/dL (ref 12.0–15.0)
Lymphocytes Relative: 28.6 % (ref 12.0–46.0)
Lymphs Abs: 1.5 10*3/uL (ref 0.7–4.0)
MCHC: 33.4 g/dL (ref 30.0–36.0)
MCV: 94.2 fl (ref 78.0–100.0)
Monocytes Absolute: 0.5 10*3/uL (ref 0.1–1.0)
Monocytes Relative: 8.8 % (ref 3.0–12.0)
Neutro Abs: 3.2 10*3/uL (ref 1.4–7.7)
Neutrophils Relative %: 60.1 % (ref 43.0–77.0)
Platelets: 272 10*3/uL (ref 150.0–400.0)
RBC: 4.29 Mil/uL (ref 3.87–5.11)
RDW: 13.4 % (ref 11.5–15.5)
WBC: 5.4 10*3/uL (ref 4.0–10.5)

## 2020-03-09 LAB — TSH: TSH: 2.09 u[IU]/mL (ref 0.35–4.50)

## 2020-03-09 LAB — HEMOGLOBIN A1C: Hgb A1c MFr Bld: 5.6 % (ref 4.6–6.5)

## 2020-03-09 LAB — VITAMIN D 25 HYDROXY (VIT D DEFICIENCY, FRACTURES): VITD: 26.41 ng/mL — ABNORMAL LOW (ref 30.00–100.00)

## 2020-03-09 MED ORDER — OMEPRAZOLE 20 MG PO CPDR
20.0000 mg | DELAYED_RELEASE_CAPSULE | Freq: Every day | ORAL | 3 refills | Status: DC
Start: 2020-03-09 — End: 2021-01-17

## 2020-03-09 NOTE — Progress Notes (Signed)
This visit occurred during the SARS-CoV-2 public health emergency.  Safety protocols were in place, including screening questions prior to the visit, additional usage of staff PPE, and extensive cleaning of exam room while observing appropriate contact time as indicated for disinfecting solutions.    Patient ID: [name] [name], female  DOB: 09-18-1960, 59 y.o.   MRN: [id number] Patient Care Team    Relationship Specialty Notifications Start End  Ma Hillock, DO PCP - General Family Medicine  07/16/15   Magrinat, Virgie Dad, MD Consulting Physician Oncology  08/10/15   [name], MD Consulting Physician Obstetrics and Gynecology  08/10/15   Armbruster, Carlota Raspberry, MD Consulting Physician Gastroenterology  10/30/18     Chief Complaint  Patient presents with  . Annual Exam    pt is fasting    Subjective:  [name] [name] is a 59 y.o.  Female  present for CPE. All past medical history, surgical history, allergies, family history, immunizations, medications and social history were updated in the electronic medical record today. All recent labs, ED visits and hospitalizations within the last year were reviewed.  Health maintenance:  Colonoscopy: completed 2019. Dr. Havery Moros Mammogram: 09/2019; yearly. Left Breast cancer 2015. Cervical cancer screening:Dr. Julien Girt, [facility], last appt last week UTD/hysterectomy for fibroids. Immunizations: Declines Flu,  Tdap completed today 2017. covid series completed. Shingles #1 provided today Infectious disease screening: HIV and Hep C completed  DEXA: completed at [facility] 03/2020.  Osteopenia. Taking Vit D and Ca use. Estrogen deficiency.  Assistive device: none Oxygen TJQ:ZESP Patient has a Dental home. Hospitalizations/ED visits: reviewed   Depression screen [facility] 2/9 03/09/2020 01/30/2019 11/12/2015 08/10/2015 03/23/2014  Decreased Interest 0 0 0 0 0  Down, Depressed, Hopeless 0 0 0 0 0  PHQ - 2 Score 0 0 0 0 0  Some recent data might be hidden   No  flowsheet data found.  Immunization History  Administered Date(s) Administered  . Influenza-Unspecified 06/04/2017  . Moderna SARS-COVID-2 Vaccination 06/26/2019, 07/25/2019  . Tdap 11/12/2015  . Zoster Recombinat (Shingrix) 03/09/2020   Past Medical History:  Diagnosis Date  . Anxiety disorder    Mild: treated by her gynecologist with selective serotonin reuptake inhibitor  . Arthritis    hands and feet  . Breast cancer ([place]) 09/09/2013  . Family history of malignant neoplasm of breast   . Fibroid uterus    hysterctomy  . GERD (gastroesophageal reflux disease)   . Hot flashes   . Hyperlipidemia   . Malignant neoplasm of breast (female), unspecified site 08/07/13   left breast invasive mammary ca in situ  . Nonspecific abnormal electrocardiogram (ECG) (EKG) 2009  . Osteoarthritis   . Palpitations 2009   Improved; Secondary to premature ventricular contractions. No problems since-pt states was caring for ailing father, anxiety  . Personal history of radiation therapy    left breast 2015  . Thyroid disease   . Tick bite of right thigh 2016  . Use of tamoxifen (Nolvadex) 08/10/2015   Started: 03/2014   . Wears glasses    No Known Allergies Past Surgical History:  Procedure Laterality Date  . ABDOMINAL HYSTERECTOMY  02/07/2011   Procedure: HYSTERECTOMY ABDOMINAL;  Surgeon: [name];  Location: [facility];  Service: Gynecology;  Laterality: N/A;  . BREAST EXCISIONAL BIOPSY Right 10/07/2014   high risk  . BREAST EXCISIONAL BIOPSY Left 12/24/2015   benign  . BREAST LUMPECTOMY Left 09/09/2013   left breast 2015  . BREAST LUMPECTOMY WITH NEEDLE LOCALIZATION AND  AXILLARY LYMPH NODE DISSECTION Left 09/09/2013   Procedure: LEFT BREAST LUMPECTOMY WITH NEEDLE LOCALIZATION AND LEFT AXILLARY LYMPH NODE DISSECTION;  Surgeon: [name], MD;  Location: [place];  Service: General;  Laterality: Left;  . BREAST LUMPECTOMY WITH RADIOACTIVE SEED LOCALIZATION Right  10/07/2014   Procedure: RIGHT BREAST LUMPECTOMY WITH RADIOACTIVE SEED LOCALIZATION;  Surgeon: Excell Seltzer, MD;  Location: [place];  Service: General;  Laterality: Right;  . BREAST LUMPECTOMY WITH RADIOACTIVE SEED LOCALIZATION Left 12/24/2015   Procedure: LEFT BREAST LUMPECTOMY WITH RADIOACTIVE SEED LOCALIZATION;  Surgeon: Excell Seltzer, MD;  Location: [place];  Service: General;  Laterality: Left;  . CESAREAN SECTION    . DILATION AND CURETTAGE OF UTERUS    . ENDOMETRIAL ABLATION  2006  . LAPAROSCOPIC ASSISTED VAGINAL HYSTERECTOMY  02/07/2011   Procedure: LAPAROSCOPIC ASSISTED VAGINAL HYSTERECTOMY;  Surgeon: [name];  Location: [facility];  Service: Gynecology;  Laterality: N/A;  . PORTACATH PLACEMENT Right 09/29/2013   Procedure: INSERTION PORT-A-CATH;  Surgeon: [name], MD;  Location: [place];  Service: General;  Laterality: Right;  . THYROIDECTOMY, PARTIAL     Family History  Problem Relation Age of Onset  . Angina Father   . Aortic aneurysm Father   . Dementia Father   . CAD Father   . Hypertension Other   . Arrhythmia Other        Uncle- a fib  . Breast cancer Mother        dx 15s. Died at 39  . Non-Hodgkin's lymphoma Mother   . Arrhythmia Maternal Aunt        A fib  . Breast cancer Maternal Aunt 56       currently 60  . Thyroid disease Maternal Aunt   . Arrhythmia Maternal Aunt        A fib  . Breast cancer Maternal Aunt        dx 40s; currently 24s  . Colon cancer Maternal Grandmother        dx 38s; died at 29  . Thyroid disease Maternal Grandmother   . Cancer Paternal Uncle        unknown primary in early 64s  . Breast cancer Maternal Aunt        dx 51s; died in 52s  . Thyroid disease Maternal Uncle   . Rectal cancer Paternal Grandmother   . Stomach cancer Neg Hx   . Esophageal cancer Neg Hx    Social History   Social History Narrative   Married, [name]. 2 children [name] and Howard)    HS graduation; [employer] employee (desk job)   Nonsmoker, 3-5 drinks weekly of etoh,  Denies drugs.    Drinks caffeine beverages   Wears seatbelt, smoke detector in the home   Firearms in the home   Exercises >3 x week.    Feels safe in relationships.     Allergies as of 03/09/2020   No Known Allergies     Medication List       Accurate as of March 09, 2020  1:08 PM. If you have any questions, ask your nurse or doctor.        STOP taking these medications   tamoxifen 20 MG tablet Commonly known as: NOLVADEX Stopped by: Howard Pouch, DO     TAKE these medications   omeprazole 20 MG capsule Commonly known as: PRILOSEC Take 1 capsule (20 mg total) by mouth daily. Started by: Howard Pouch,  DO   vitamin C 100 MG tablet Take 100 mg by mouth daily.   Vitamin D 50 MCG (2000 UT) tablet Take 1,000 Units by mouth daily.   ZINC PO Take by mouth.       All past medical history, surgical history, allergies, family history, immunizations andmedications were updated in the EMR today and reviewed under the history and medication portions of their EMR.     No results found for this or any previous visit (from the past 2160 hour(s)).  MM 3D SCREEN BREAST BILATERAL  Result Date: 10/07/2019 CLINICAL DATA:  Screening. EXAM: DIGITAL SCREENING BILATERAL MAMMOGRAM WITH TOMO AND CAD COMPARISON:  Previous exam(s). ACR Breast Density Category b: There are scattered areas of fibroglandular density. FINDINGS: There are no findings suspicious for malignancy. Images were processed with CAD. IMPRESSION: No mammographic evidence of malignancy. A result letter of this screening mammogram will be mailed directly to the patient. RECOMMENDATION: Screening mammogram in one year. (Code:SM-B-01Y) BI-RADS CATEGORY  1: Negative. Electronically Signed   By: Franki Cabot M.D.   On: 10/07/2019 12:47     ROS: 14 pt review of systems performed and negative (unless mentioned in an HPI)  Objective: BP  136/88   Pulse 77   Temp 98.2 F (36.8 C) (Oral)   Ht 5' 6.25" (1.683 m)   Wt 160 lb (72.6 kg)   SpO2 100%   BMI 25.63 kg/m  Gen: Afebrile. No acute distress. Nontoxic in appearance, well-developed, well-nourished,  Pleasant female. HENT: AT. [place]. Bilateral TM visualized and normal in appearance, normal external auditory canal. MMM, no oral lesions, adequate dentition. Bilateral nares within normal limits. Throat without erythema, ulcerations or exudates. no Cough on exam, no hoarseness on exam. Eyes:Pupils Equal Round Reactive to light, Extraocular movements intact,  Conjunctiva without redness, discharge or icterus. Neck/lymp/endocrine: Supple,no lymphadenopathy, mild thyromegaly CV: RRR no murmur, no edema, +2/4 P posterior tibialis pulses.  Chest: CTAB, no wheeze, rhonchi or crackles. normal Respiratory effort. good Air movement. Abd: Soft. flat. NTND. BS present. no Masses palpated. No hepatosplenomegaly. No rebound tenderness or guarding. Skin: no rashes, purpura or petechiae. Warm and well-perfused. Skin intact. Neuro/Msk:  Normal gait. PERLA. EOMi. Alert. Oriented x3.  Cranial nerves II through XII intact. Muscle strength 5/5 upper/lower extremity. DTRs equal bilaterally. Psych: Normal affect, dress and demeanor. Normal speech. Normal thought content and judgment.   No exam data present  Assessment/plan: [name] [name] is a 59 y.o. female present for  CPE  Thyroid fullness - TSH Anemia in neoplastic disease - CBC with Differential/Platelet Hypocalcemia - Comprehensive metabolic panel - VITAMIN D 25 Hydroxy (Vit-D Deficiency, Fractures) Diabetes mellitus screening - Hemoglobin A1c Lipid screening - Lipid panel Need for shingles vaccine - Varicella-zoster vaccine IM Snoring/ Elevated BP without diagnosis of hypertension/Nonintractable headache, unspecified chronicity pattern, unspecified headache type Patient presented with snoring and sleep apnea-like symptoms  approximately 3 years ago referred to pulmonology.  She did not follow through with referral at that time.  She states now she is waking up with a daily headache and her blood pressure is borderline.  She would like referral for sleep apnea evaluation. - Ambulatory referral to Neurology GERD stable if continues omeprazole.  Discussed long-term PPI use with her today. Omeprazole 20 mg daily prescribed. F/u prn Monitor vitamin D, B12 and mag every 2-3 years Encounter for preventive health examination Patient was encouraged to exercise greater than 150 minutes a week. Patient was encouraged to choose a diet filled with fresh  fruits and vegetables, and lean meats. AVS provided to patient today for education/recommendation on gender specific health and safety maintenance. Colonoscopy: completed 2019. Dr. Havery Moros Mammogram: 09/2019; yearly. Left Breast cancer 2015. Cervical cancer screening:Dr. Julien Girt, [facility], last appt last week UTD/hysterectomy for fibroids. Immunizations: Declines Flu,  Tdap completed today 2017. covid series completed. Shingles #1 provided today Infectious disease screening: HIV and Hep C completed  [facility]: [facility] 03/[facility].  Osteopenia. Taking Vit D and Ca use. Estrogen deficiency.    Return in about 1 year (around 03/09/2021) for CPE (30 min).  Orders Placed This Encounter  Procedures  . Varicella-zoster vaccine IM  . CBC with Differential/Platelet  . Comprehensive metabolic panel  . Hemoglobin A1c  . Lipid panel  . TSH  . VITAMIN D 25 Hydroxy (Vit-D Deficiency, Fractures)  . Ambulatory referral to Neurology    Meds ordered this encounter  Medications  . omeprazole (PRILOSEC) 20 MG capsule    Sig: Take 1 capsule (20 mg total) by mouth daily.    Dispense:  90 capsule    Refill:  3    Referral Orders     Ambulatory referral to Neurology   Electronically signed by: Howard Pouch, DO [place]

## 2020-03-09 NOTE — Patient Instructions (Addendum)
Lower sodium content in meals.  Increase water consumption.  Exercise.  Monitor BP make sure less 140/90 Referred you to neurology  Called omeprazole   Health Maintenance, Female Adopting a healthy lifestyle and getting preventive care are important in promoting health and wellness. Ask your health care provider about:  The right schedule for you to have regular tests and exams.  Things you can do on your own to prevent diseases and keep yourself healthy. What should I know about diet, weight, and exercise? Eat a healthy diet   Eat a diet that includes plenty of vegetables, fruits, low-fat dairy products, and lean protein.  Do not eat a lot of foods that are high in solid fats, added sugars, or sodium. Maintain a healthy weight Body mass index (BMI) is used to identify weight problems. It estimates body fat based on height and weight. Your health care provider can help determine your BMI and help you achieve or maintain a healthy weight. Get regular exercise Get regular exercise. This is one of the most important things you can do for your health. Most adults should:  Exercise for at least 150 minutes each week. The exercise should increase your heart rate and make you sweat (moderate-intensity exercise).  Do strengthening exercises at least twice a week. This is in addition to the moderate-intensity exercise.  Spend less time sitting. Even light physical activity can be beneficial. Watch cholesterol and blood lipids Have your blood tested for lipids and cholesterol at 61 years of age, then have this test every 5 years. Have your cholesterol levels checked more often if:  Your lipid or cholesterol levels are high.  You are older than 61 years of age.  You are at high risk for heart disease. What should I know about cancer screening? Depending on your health history and family history, you may need to have cancer screening at various ages. This may include screening  for:  Breast cancer.  Cervical cancer.  Colorectal cancer.  Skin cancer.  Lung cancer. What should I know about heart disease, diabetes, and high blood pressure? Blood pressure and heart disease  High blood pressure causes heart disease and increases the risk of stroke. This is more likely to develop in people who have high blood pressure readings, are of African descent, or are overweight.  Have your blood pressure checked: ? Every 3-5 years if you are 72-73 years of age. ? Every year if you are 20 years old or older. Diabetes Have regular diabetes screenings. This checks your fasting blood sugar level. Have the screening done:  Once every three years after age 31 if you are at a normal weight and have a low risk for diabetes.  More often and at a younger age if you are overweight or have a high risk for diabetes. What should I know about preventing infection? Hepatitis B If you have a higher risk for hepatitis B, you should be screened for this virus. Talk with your health care provider to find out if you are at risk for hepatitis B infection. Hepatitis C Testing is recommended for:  Everyone born from [birth year] through 1967.  Anyone with known risk factors for hepatitis C. Sexually transmitted infections (STIs)  Get screened for STIs, including gonorrhea and chlamydia, if: ? You are sexually active and are younger than 61 years of age. ? You are older than 61 years of age and your health care provider tells you that you are at risk for this type of infection. ?  Your sexual activity has changed since you were last screened, and you are at increased risk for chlamydia or gonorrhea. Ask your health care provider if you are at risk.  Ask your health care provider about whether you are at high risk for HIV. Your health care provider may recommend a prescription medicine to help prevent HIV infection. If you choose to take medicine to prevent HIV, you should first get tested for HIV.  You should then be tested every 3 months for as long as you are taking the medicine. Pregnancy  If you are about to stop having your period (premenopausal) and you may become pregnant, seek counseling before you get pregnant.  Take 400 to 800 micrograms (mcg) of folic acid every day if you become pregnant.  Ask for birth control (contraception) if you want to prevent pregnancy. Osteoporosis and menopause Osteoporosis is a disease in which the bones lose minerals and strength with aging. This can result in bone fractures. If you are 85 years old or older, or if you are at risk for osteoporosis and fractures, ask your health care provider if you should:  Be screened for bone loss.  Take a calcium or vitamin D supplement to lower your risk of fractures.  Be given hormone replacement therapy (HRT) to treat symptoms of menopause. Follow these instructions at home: Lifestyle  Do not use any products that contain nicotine or tobacco, such as cigarettes, e-cigarettes, and chewing tobacco. If you need help quitting, ask your health care provider.  Do not use street drugs.  Do not share needles.  Ask your health care provider for help if you need support or information about quitting drugs. Alcohol use  Do not drink alcohol if: ? Your health care provider tells you not to drink. ? You are pregnant, may be pregnant, or are planning to become pregnant.  If you drink alcohol: ? Limit how much you use to 0-1 drink a day. ? Limit intake if you are breastfeeding.  Be aware of how much alcohol is in your drink. In the [place] one drink equals one 12 oz bottle of beer (355 mL), one 5 oz glass of wine (148 mL), or one 1 oz glass of hard liquor (44 mL). General instructions  Schedule regular health, dental, and eye exams.  Stay current with your vaccines.  Tell your health care provider if: ? You often feel depressed. ? You have ever been abused or do not feel safe at  home. Summary  Adopting a healthy lifestyle and getting preventive care are important in promoting health and wellness.  Follow your health care provider's instructions about healthy diet, exercising, and getting tested or screened for diseases.  Follow your health care provider's instructions on monitoring your cholesterol and blood pressure. This information is not intended to replace advice given to you by your health care provider. Make sure you discuss any questions you have with your health care provider. Document Revised: 04/10/2018 Document Reviewed: 04/10/2018 Elsevier Patient Education  2020 [employer].

## 2020-03-10 ENCOUNTER — Telehealth: Payer: Self-pay | Admitting: Family Medicine

## 2020-03-10 NOTE — Telephone Encounter (Signed)
Spoke with pt regarding labs and instructions.   

## 2020-03-10 NOTE — Telephone Encounter (Signed)
Please call patient -kidney and thyroid function are normal -Blood cell counts and electrolytes are normal -Diabetes screening/A1c is normal -Vit D is mildly low at 26> I would encourage her to increase her daily vit d supplement by 1000u   -One of her liver enzymes called ALT is also mildly above normal at 37 (normal <35). This is not concerning at this level. Most common causes are: high triglycerides (which she has mild increase), alcohol use (even 1-2 drinks the day prior to lab), tylenol use and other OTC herbals.   -Cholesterol panel looks good and is basically at goal, with the exception of her Triglycerides are mildly elevated at 165 (goal: <150). The main way to deal with high triglycerides is to eat better and get more exercise. Try to lower the saturated fat, trans fat, and cholesterol in the diet. Cutting back on carbohydrates will help, too. Drink less alcohol. Increase omega-3s, a fat that's good for you. It may be hard to get enough omega-3s from food. She can add an omega-3 OTC supplement of 1000-2000 mg a day.

## 2020-03-17 ENCOUNTER — Ambulatory Visit: Payer: Commercial Managed Care - PPO | Admitting: Physician Assistant

## 2020-04-02 ENCOUNTER — Encounter: Payer: Commercial Managed Care - PPO | Admitting: Family Medicine

## 2020-04-10 ENCOUNTER — Encounter: Payer: Self-pay | Admitting: Oncology

## 2020-04-15 ENCOUNTER — Ambulatory Visit (INDEPENDENT_AMBULATORY_CARE_PROVIDER_SITE_OTHER): Payer: Commercial Managed Care - PPO | Admitting: Neurology

## 2020-04-15 ENCOUNTER — Encounter: Payer: Self-pay | Admitting: Neurology

## 2020-04-15 ENCOUNTER — Institutional Professional Consult (permissible substitution): Payer: Commercial Managed Care - PPO | Admitting: Neurology

## 2020-04-15 ENCOUNTER — Other Ambulatory Visit: Payer: Self-pay

## 2020-04-15 VITALS — BP 133/87 | HR 80 | Ht 67.0 in | Wt 160.0 lb

## 2020-04-15 DIAGNOSIS — R682 Dry mouth, unspecified: Secondary | ICD-10-CM | POA: Insufficient documentation

## 2020-04-15 DIAGNOSIS — G478 Other sleep disorders: Secondary | ICD-10-CM | POA: Diagnosis not present

## 2020-04-15 DIAGNOSIS — R0683 Snoring: Secondary | ICD-10-CM | POA: Diagnosis not present

## 2020-04-15 DIAGNOSIS — F5104 Psychophysiologic insomnia: Secondary | ICD-10-CM | POA: Diagnosis not present

## 2020-04-15 DIAGNOSIS — R519 Headache, unspecified: Secondary | ICD-10-CM | POA: Insufficient documentation

## 2020-04-15 HISTORY — DX: Psychophysiologic insomnia: F51.04

## 2020-04-15 NOTE — Patient Instructions (Signed)

## 2020-04-15 NOTE — Progress Notes (Signed)
SLEEP MEDICINE CLINIC    Provider:  Larey Seat, MD  Primary Care Physician:  Ma Hillock, DO [address] [place] [id number]     Referring Provider: Ma Hillock, Do [address],  [place] [id number]          Chief Complaint according to patient   Patient presents with:    . New Patient (Initial Visit)           HISTORY OF PRESENT ILLNESS:  [name] [name] is a 59- year- old Caucasian female patient seen here in a consultation requested by her PCP, upon referral on 04/15/2020.  Chief concern according to patient :  "my sleep is no longer restorative, I have no problem to fall asleep, but I wake up earlier than I want to- sometimes from snoring, gasping"   I have the pleasure of seeing [name] [name] today, a right-handed White or Caucasian female with a possible sleep disorder.  She  has a past medical history of Anxiety disorder, Arthritis, Breast cancer ([place]) (09/09/2013), Family history of malignant neoplasm of breast, Fibroid uterus, GERD (gastroesophageal reflux disease), Hot flashes, Hyperlipidemia, Malignant neoplasm of breast (female), unspecified site (08/07/13), Nonspecific abnormal electrocardiogram (ECG) (EKG) (2009), Osteoarthritis, Palpitations (2009), Personal history of radiation therapy, Thyroid disease, Tick bite of right thigh (2016), Use of tamoxifen (Nolvadex) (08/10/2015), and Wears glasses., enlarged thyroid was partially removed, nodules were found, non cancerous.    Sleep relevant medical history:  Thyroid surgery, no Tonsillectomy,neck DDD but no  cervical spine surgery.  Family medical /sleep history: Goiter in maternal GM,  No other family member on CPAP with OSA, but her parents snored loudly and slept in  different rooms.    Social history:  Patient is working as Marketing executive , Production manager of deeds-  and lives in a household with spouse, no pets. The patient currently days. Tobacco use; none .  ETOH use ; sociaily 3-4 glasses a week, Caffeine  intake in form of Coffee( 2-3 in AM ) Soda( / since one month ago ) Tea ( sometimes on Sunday) or energy drinks. Regular exercise: none, no motivation. Gained weight 12 pounds in pandemic.     Sleep habits are as follows: The patient's dinner time is between 7  PM. The patient goes to bed at 10-11 PM, (often has slept in a chair on the sofa), and continues to sleep for 5  hours, wakes for unknown reasons between 4-5 AM. TST is 6-8 hours.  She may get an hour more. No TV in bedroom, cool, quiet and dark.  The preferred sleep position is sideways, used to be prone, with the support of 1 pillow. History of GERD, not affecting sleep now- Dreams are reportedly rare/ infrequent.  6.45  AM is the usual rise time. The patient wakes up spontaneously but has an alarm .   She reports not feeling refreshed or restored in AM, with symptoms such as dry mouth, morning headaches, and residual fatigue. Naps are taken infrequently, but they can last hours- no more refreshing than nocturnal sleep.    Review of Systems: Out of a complete 14 system review, the patient complains of only the following symptoms, and all other reviewed systems are negative.:  Fatigue, sleepiness , snoring, fragmented sleep, Insomnia - early arousals.    How likely are you to doze in the following situations: 0 = not likely, 1 = slight chance, 2 = moderate chance, 3 = high chance  Sitting and Reading? Watching Television? Sitting inactive in a public place (theater or meeting)? As a passenger in a car for an hour without a break? Lying down in the afternoon when circumstances permit? Sitting and talking to someone? Sitting quietly after lunch without alcohol? In a car, while stopped for a few minutes in traffic?   Total = 10/ 24 points   FSS endorsed at 25-30/ 63 points.   Social History   Socioeconomic History  . Marital status: Married    Spouse name: Not on file  . Number of children: Not on file  . Years of education: Not  on file  . Highest education level: Not on file  Occupational History  . Occupation: Register of deeds    Comment: Full time  Tobacco Use  . Smoking status: Never Smoker  . Smokeless tobacco: Never Used  Vaping Use  . Vaping Use: Never used  Substance and Sexual Activity  . Alcohol use: Yes    Alcohol/week: 2.0 standard drinks    Types: 2 Glasses of wine per week  . Drug use: Not Currently    Comment: smoked marijuana last month  . Sexual activity: Yes    Birth control/protection: Surgical  Other Topics Concern  . Not on file  Social History Narrative   Married, [name]. 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    Drinks caffeine beverages   Wears seatbelt, smoke detector in the home   Firearms in the home   Exercises >3 x week.    Feels safe in relationships.    Social Determinants of Health   Financial Resource Strain: Not on file  Food Insecurity: Not on file  Transportation Needs: Not on file  Physical Activity: Not on file  Stress: Not on file  Social Connections: Not on file    Family History  Problem Relation Age of Onset  . Angina Father   . Aortic aneurysm Father   . Dementia Father   . CAD Father   . Hypertension Other   . Arrhythmia Other        Uncle- a fib  . Breast cancer Mother        dx 68s. Died at 71  . Non-Hodgkin's lymphoma Mother   . Arrhythmia Maternal Aunt        A fib  . Breast cancer Maternal Aunt 56       currently 41  . Thyroid disease Maternal Aunt   . Arrhythmia Maternal Aunt        A fib  . Breast cancer Maternal Aunt        dx 78s; currently 66s  . Colon cancer Maternal Grandmother        dx 24s; died at 26  . Thyroid disease Maternal Grandmother   . Cancer Paternal Uncle        unknown primary in early 53s  . Breast cancer Maternal Aunt        dx 29s; died in 77s  . Thyroid disease Maternal Uncle   . Rectal cancer Paternal  Grandmother   . Stomach cancer Neg Hx   . Esophageal cancer Neg Hx     Past Medical History:  Diagnosis Date  . Anxiety disorder    Mild: treated by her gynecologist with selective serotonin reuptake inhibitor  . Arthritis    hands and feet  . Breast cancer ([place]) 09/09/2013  .  Family history of malignant neoplasm of breast   . Fibroid uterus    hysterctomy  . GERD (gastroesophageal reflux disease)   . Hot flashes   . Hyperlipidemia   . Malignant neoplasm of breast (female), unspecified site 08/07/13   left breast invasive mammary ca in situ  . Nonspecific abnormal electrocardiogram (ECG) (EKG) 2009  . Osteoarthritis   . Palpitations 2009   Improved; Secondary to premature ventricular contractions. No problems since-pt states was caring for ailing father, anxiety  . Personal history of radiation therapy    left breast 2015  . Thyroid disease   . Tick bite of right thigh 2016  . Use of tamoxifen (Nolvadex) 08/10/2015   Started: 03/2014   . Wears glasses     Past Surgical History:  Procedure Laterality Date  . ABDOMINAL HYSTERECTOMY  02/07/2011   Procedure: HYSTERECTOMY ABDOMINAL;  Surgeon: [name];  Location: [facility];  Service: Gynecology;  Laterality: N/A;  . BREAST EXCISIONAL BIOPSY Right 10/07/2014   high risk  . BREAST EXCISIONAL BIOPSY Left 12/24/2015   benign  . BREAST LUMPECTOMY Left 09/09/2013   left breast 2015  . BREAST LUMPECTOMY WITH NEEDLE LOCALIZATION AND AXILLARY LYMPH NODE DISSECTION Left 09/09/2013   Procedure: LEFT BREAST LUMPECTOMY WITH NEEDLE LOCALIZATION AND LEFT AXILLARY LYMPH NODE DISSECTION;  Surgeon: [name], MD;  Location: [facility];  Service: General;  Laterality: Left;  . BREAST LUMPECTOMY WITH RADIOACTIVE SEED LOCALIZATION Right 10/07/2014   Procedure: RIGHT BREAST LUMPECTOMY WITH RADIOACTIVE SEED LOCALIZATION;  Surgeon: Excell Seltzer, MD;  Location: [facility];  Service: General;  Laterality:  Right;  . BREAST LUMPECTOMY WITH RADIOACTIVE SEED LOCALIZATION Left 12/24/2015   Procedure: LEFT BREAST LUMPECTOMY WITH RADIOACTIVE SEED LOCALIZATION;  Surgeon: Excell Seltzer, MD;  Location: [facility];  Service: General;  Laterality: Left;  . CESAREAN SECTION    . DILATION AND CURETTAGE OF UTERUS    . ENDOMETRIAL ABLATION  2006  . LAPAROSCOPIC ASSISTED VAGINAL HYSTERECTOMY  02/07/2011   Procedure: LAPAROSCOPIC ASSISTED VAGINAL HYSTERECTOMY;  Surgeon: [name];  Location: [facility];  Service: Gynecology;  Laterality: N/A;  . PORTACATH PLACEMENT Right 09/29/2013   Procedure: INSERTION PORT-A-CATH;  Surgeon: [name], MD;  Location: [facility];  Service: General;  Laterality: Right;  . THYROIDECTOMY, PARTIAL       Current Outpatient Medications on File Prior to Visit  Medication Sig Dispense Refill  . Ascorbic Acid (VITAMIN C) 1000 MG tablet Take 1,000 mg by mouth daily.    . Cholecalciferol (VITAMIN D3) 125 MCG (5000 UT) TABS Take 1 tablet by mouth daily.    . Multiple Vitamins-Minerals (ZINC PO) Take by mouth.    Marland Kitchen omeprazole (PRILOSEC) 20 MG capsule Take 1 capsule (20 mg total) by mouth daily. (Patient not taking: Reported on 04/15/2020) 90 capsule 3   No current facility-administered medications on file prior to visit.    Physical exam:  Today's Vitals   04/15/20 1115  BP: 133/87  Pulse: 80  Weight: 160 lb (72.6 kg)  Height: 5\' 7"  (1.702 m)   Body mass index is 25.06 kg/m.   Wt Readings from Last 3 Encounters:  04/15/20 160 lb (72.6 kg)  03/09/20 160 lb (72.6 kg)  11/11/19 161 lb 1.6 oz (73.1 kg)     Ht Readings from Last 3 Encounters:  04/15/20 5\' 7"  (1.702 m)  03/09/20 5' 6.25" (1.683 m)  11/11/19 5' 7.13" (1.705 m)  General: The patient is awake, alert and appears not in acute distress. The patient is well groomed. Head: Normocephalic, atraumatic. Neck is supple. Mallampati : 2,  neck circumference:14. 75 inches .   Nasal airflow is here patent.   Retrognathia is mild , there is a small mouth noted- .  Dental status: intact.  Cardiovascular:  Regular rate and cardiac rhythm by pulse,  without distended neck veins. Respiratory: Lungs are clear to auscultation.  Skin:  Without evidence of ankle edema, or rash. Trunk: The patient's posture is erect.   Neurologic exam : The patient is awake and alert, oriented to place and time.   Memory subjective described as intact.  Attention span & concentration ability appears normal.  Speech is fluent,  without dysarthria, dysphonia or aphasia.  Mood and affect are appropriate.   Cranial nerves: no loss of smell or taste reported  Pupils are equal and briskly reactive to light. Funduscopic exam   Extraocular movements in vertical and horizontal planes were intact and without nystagmus. Near sighted.   No Diplopia. Visual fields by finger perimetry are intact. Hearing was intact to soft voice and finger rubbing.  Facial sensation intact to fine touch. Facial motor strength is symmetric and tongue and uvula move midline.  Neck ROM : rotation, tilt and flexion extension were normal for age and shoulder shrug was symmetrical.    Motor exam:  Symmetric bulk, tone and ROM.   Normal tone without cog- wheeling, symmetric grip strength .   Sensory:  Fine touch, pinprick and vibration were normal.  Proprioception tested in the upper extremities was normal.   Coordination: Rapid alternating movements in the fingers/hands were of normal speed.  The Finger-to-nose maneuver was intact without evidence of ataxia, dysmetria or tremor.   Gait and station: Patient could rise unassisted from a seated position, walked without assistive device.  Stance is of normal width/ base and the patient turned with 3 steps.  Toe and heel walk were deferred.  Deep tendon reflexes: in the  upper and lower extremities are brisk, symmetric and intact.  Babinski response was deferred .        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  Discussed to cover the clock, avoid naps before bedtime and taking a hot bath.   2) sleep related headaches and dry mouth- There may be sleep apnea, she resumed snoring, supine sleep had weight gain.     My Plan is to proceed with:  1)  Screen for sleep apnea by home sleep test. 2)  Sleep hygiene - set bed time, rise time ,avoid naps and screens in the bedroom.    I would like to thank  Ma Hillock, Do [address],  [place] [id number] for allowing me to meet with and to take care of this pleasant patient.   I plan to follow up either personally or through our NP within 3-4 month.   CC: I will share my notes with PCP. Marland Kitchen  Electronically signed by: Larey Seat, MD 04/15/2020 11:28 AM  [facility]  and [employer] certified by [employer] of Sleep Medicine and Diplomate of the [employer] of Sleep Medicine. Board certified In Neurology through the [place], Fellow of the [employer] of Neurology. Medical Director of [employer].

## 2020-04-19 ENCOUNTER — Telehealth: Payer: Self-pay

## 2020-04-19 NOTE — Telephone Encounter (Signed)
LVM for pt to call me back to schedule sleep study  

## 2020-06-04 ENCOUNTER — Ambulatory Visit (INDEPENDENT_AMBULATORY_CARE_PROVIDER_SITE_OTHER): Payer: Commercial Managed Care - PPO | Admitting: Physician Assistant

## 2020-06-04 ENCOUNTER — Other Ambulatory Visit: Payer: Self-pay

## 2020-06-04 ENCOUNTER — Encounter: Payer: Self-pay | Admitting: Physician Assistant

## 2020-06-04 DIAGNOSIS — L821 Other seborrheic keratosis: Secondary | ICD-10-CM | POA: Diagnosis not present

## 2020-06-04 DIAGNOSIS — D1800 Hemangioma unspecified site: Secondary | ICD-10-CM | POA: Diagnosis not present

## 2020-06-04 DIAGNOSIS — L738 Other specified follicular disorders: Secondary | ICD-10-CM

## 2020-06-04 DIAGNOSIS — L570 Actinic keratosis: Secondary | ICD-10-CM

## 2020-06-04 DIAGNOSIS — Z1283 Encounter for screening for malignant neoplasm of skin: Secondary | ICD-10-CM | POA: Diagnosis not present

## 2020-06-09 ENCOUNTER — Other Ambulatory Visit: Payer: Self-pay

## 2020-06-09 ENCOUNTER — Ambulatory Visit (INDEPENDENT_AMBULATORY_CARE_PROVIDER_SITE_OTHER): Payer: Commercial Managed Care - PPO | Admitting: Neurology

## 2020-06-09 ENCOUNTER — Ambulatory Visit (INDEPENDENT_AMBULATORY_CARE_PROVIDER_SITE_OTHER): Payer: Commercial Managed Care - PPO

## 2020-06-09 DIAGNOSIS — R0683 Snoring: Secondary | ICD-10-CM

## 2020-06-09 DIAGNOSIS — G4733 Obstructive sleep apnea (adult) (pediatric): Secondary | ICD-10-CM | POA: Diagnosis not present

## 2020-06-09 DIAGNOSIS — R519 Headache, unspecified: Secondary | ICD-10-CM

## 2020-06-09 DIAGNOSIS — Z23 Encounter for immunization: Secondary | ICD-10-CM | POA: Diagnosis not present

## 2020-06-09 DIAGNOSIS — F5104 Psychophysiologic insomnia: Secondary | ICD-10-CM

## 2020-06-09 DIAGNOSIS — G478 Other sleep disorders: Secondary | ICD-10-CM

## 2020-06-09 NOTE — Progress Notes (Signed)
Patient here for second shingles vaccine.  Vaccine given in right deltoid and patient tolerated well.

## 2020-06-21 ENCOUNTER — Encounter: Payer: Self-pay | Admitting: Physician Assistant

## 2020-06-21 NOTE — Progress Notes (Signed)
   Follow-Up Visit   Subjective  [name] [name] is a 60 y.o. female who presents for the following: Annual Exam (No new conerns).   The following portions of the chart were reviewed this encounter and updated as appropriate:      Objective  Well appearing patient in no apparent distress; mood and affect are within normal limits.  A full examination was performed including scalp, head, eyes, ears, nose, lips, neck, chest, axillae, abdomen, back, buttocks, bilateral upper extremities, bilateral lower extremities, hands, feet, fingers, toes, fingernails, and toenails. All findings within normal limits unless otherwise noted below.  Objective  Chest - Medial [facility]): Full body skin check  Objective  Left Abdomen (side) - Upper: Multiple Stuck-on, waxy, tan-brown papules and plaques. --Discussed benign etiology and prognosis.   Objective  Chest - Medial (Center) (4), Left Lower Back, Left Thigh - Anterior (3), Left Upper Back, Right Forearm - Anterior (2), Right Lower Back (3), Right Lower Leg - Anterior, Right Shoulder - Anterior, Right Thigh - Anterior (3): Erythematous patches with gritty scale.  Objective  Right Upper Back: Red raised papule  Objective  Mid Forehead: Small yellow papules with a central dell.    Assessment & Plan  Screening exam for skin cancer Chest - Medial [facility])  Yearly skin check  Seborrheic keratosis Left Abdomen (side) - Upper  Okay to leave if stable  AK (actinic keratosis) (19) Right Shoulder - Anterior; Right Forearm - Anterior (2); Left Thigh - Anterior (3); Right Thigh - Anterior (3); Right Lower Leg - Anterior; Chest - Medial (Center) (4); Left Upper Back; Left Lower Back; Right Lower Back (3)  Destruction of lesion - Chest - Medial (Center), Left Lower Back, Left Thigh - Anterior, Left Upper Back, Right Forearm - Anterior, Right Lower Back, Right Lower Leg - Anterior, Right Shoulder - Anterior, Right Thigh - Anterior Complexity: simple    Destruction method: cryotherapy   Informed consent: discussed and consent obtained   Timeout:  patient name, date of birth, surgical site, and procedure verified Lesion destroyed using liquid nitrogen: Yes   Cryotherapy cycles:  3 Outcome: patient tolerated procedure well with no complications    Hemangioma, unspecified site Right Upper Back  Okay to leave if stable  Sebaceous hyperplasia of face Mid Forehead  observe    I, Trong Gosling, PA-C, have reviewed all documentation's for this visit.  The documentation on 06/21/20 for the exam, diagnosis, procedures and orders are all accurate and complete.

## 2020-06-22 NOTE — Progress Notes (Signed)
   [facility] TEST (Watch PAT)  STUDY DATE: 06/22/20  DOB: [birth year]  MRN: [id number]  ORDERING CLINICIAN: Larey Seat, MD   REFERRING CLINICIANRaoul Pitch, Renee A, DO   CLINICAL INFORMATION/HISTORY: [name] [name] isa right-handed Caucasian female with a medical history of Anxiety disorder, Arthritis, Breast cancer ([place]) (09/09/2013), Family history of malignant neoplasm of breast, Fibroid uterus, GERD (gastroesophageal reflux disease), Hot flashes, Hyperlipidemia, Malignant neoplasm of breast (female), unspecified site (08/07/13), Nonspecific abnormal electrocardiogram (ECG) (EKG) (2009), Osteoarthritis, Palpitations (2009), Personal history of radiation therapy, Tick bite of right thigh (2016), Use of tamoxifen (Nolvadex) (08/10/2015), and anenlarged thyroid was partially removed, nodules were found, non cancerous.   She presented with non restorative sleep, snoring and weight gain.   Epworth sleepiness score: 10/24.  BMI: 25.3 kg/m  Neck Circumference: 14.75 "  FINDINGS:   Total Record Time (hours, min): 8 h 12 min  Total Sleep Time (hours, min):  7 h 24 min   Percent REM (%):    22.50 %   Calculated pAHI (per hour):  5.6       REM pAHI: 11.4    NREM pAHI: 3.9   Oxygen Saturation (%) Mean: 95  Minimum oxygen saturation (%):        85   O2 Saturation Range (%): 85-99  O2Saturation (minutes) <=88%: 0.1 min   Pulse Mean (bpm):    81  Pulse Range (55-106)   IMPRESSION: Mild OSA (obstructive sleep apnea) is present at AHI 5.6/h and can be treated with CPAP, or dental device. REM sleep almost doubled the AHI.   RECOMMENDATION:  Auto CPAP 5-10 cm water 2 cm EPR and heated humidification, mask of her choice.    INTERPRETING PHYSICIAN:  Larey Seat, MD  [facility] ,Medical Director of [facility].  Board certified by [employer] of Sleep Medicine and Fellow of the [employer] of Neurology.

## 2020-06-28 ENCOUNTER — Encounter: Payer: Self-pay | Admitting: Neurology

## 2020-06-28 ENCOUNTER — Encounter: Payer: Self-pay | Admitting: Family Medicine

## 2020-06-28 ENCOUNTER — Telehealth: Payer: Self-pay | Admitting: Neurology

## 2020-06-28 ENCOUNTER — Other Ambulatory Visit: Payer: Self-pay | Admitting: Neurology

## 2020-06-28 DIAGNOSIS — F5104 Psychophysiologic insomnia: Secondary | ICD-10-CM

## 2020-06-28 DIAGNOSIS — G4733 Obstructive sleep apnea (adult) (pediatric): Secondary | ICD-10-CM

## 2020-06-28 DIAGNOSIS — R519 Headache, unspecified: Secondary | ICD-10-CM

## 2020-06-28 DIAGNOSIS — R0683 Snoring: Secondary | ICD-10-CM

## 2020-06-28 DIAGNOSIS — G478 Other sleep disorders: Secondary | ICD-10-CM

## 2020-06-28 NOTE — Telephone Encounter (Signed)
-----   Message from Larey Seat, MD sent at 06/28/2020 10:05 AM EST ----- IMPRESSION: Mild OSA (obstructive sleep apnea) is present at AHI 5.6/h and can be treated with CPAP, or dental device. REM sleep almost doubled the AHI.   RECOMMENDATION:  Auto CPAP 5-10 cm water 2 cm EPR and heated humidification, mask of her choice.    INTERPRETING PHYSICIAN:  Larey Seat, MD [facility] ,Medical Director of [facility].

## 2020-06-28 NOTE — Addendum Note (Signed)
Addended by: Larey Seat on: 06/28/2020 10:05 AM   Modules accepted: Orders

## 2020-06-28 NOTE — Telephone Encounter (Signed)
Called the patient and advised of the sleep study findings.  Informed that the apnea was overall mild.  Patient was informed of the auto CPAP as well as a dental device for treatment of mild sleep apnea.  Educated the patient on both processes.  Patient would like to pursue the dental device route but is aware that insurance may not cover it until she is tried CPAP.  Advised that I can go ahead and also send the order to a DME company to allow them to start the process as well and if a dental device is covered she can always opt out of that option.  I will forward the order to the [facility]).  I reviewed compliance with instructions for the patient in regards to the CPAP.  Advised a 31 to 90-day initial CPAP follow-up visit would be necessary.  Patient verbalized understanding and had no further questions at this time.  Advised a letter will be forwarded to her on my chart of the number to the company as well as a recap of compliance guidelines and a reminder to schedule her visit.  Patient was appreciative for the call.

## 2020-06-28 NOTE — Procedures (Signed)
[facility] TEST (Watch PAT)  STUDY DATE: 06/22/20  DOB: 1960-10-26  MRN: [id number]  ORDERING CLINICIAN: Larey Seat, MD   REFERRING CLINICIANRaoul Pitch, Renee A, DO   CLINICAL INFORMATION/HISTORY: [name] [name] isa right-handed Caucasian female with a medical history of Anxiety disorder, Arthritis, Breast cancer ([place]) (09/09/2013), Family history of malignant neoplasm of breast, Fibroid uterus, GERD (gastroesophageal reflux disease), Hot flashes, Hyperlipidemia, Malignant neoplasm of breast (female), unspecified site (08/07/13), Nonspecific abnormal electrocardiogram (ECG) (EKG) (2009), Osteoarthritis, Palpitations (2009), Personal history of radiation therapy, Tick bite of right thigh (2016), Use of tamoxifen (Nolvadex) (08/10/2015), and anenlarged thyroid was partially removed, nodules were found, non cancerous.   She presented with non restorative sleep, snoring and weight gain.   Epworth sleepiness score: 10/24.  BMI: 25.3 kg/m  Neck Circumference: 14.75 "  FINDINGS:   Total Record Time (hours, min): 8 h 12 min  Total Sleep Time (hours, min):  7 h 24 min   Percent REM (%):    22.50 %   Calculated pAHI (per hour):  5.6       REM pAHI: 11.4    NREM pAHI: 3.9   Oxygen Saturation (%) Mean: 95  Minimum oxygen saturation (%):        85   O2 Saturation Range (%): 85-99  O2Saturation (minutes) <=88%: 0.1 min   Pulse Mean (bpm):    81  Pulse Range (55-106)   IMPRESSION: Mild OSA (obstructive sleep apnea) is present at AHI 5.6/h and can be treated with CPAP, or dental device. REM sleep almost doubled the AHI.   RECOMMENDATION:  Auto CPAP 5-10 cm water 2 cm EPR and heated humidification, mask of her choice.    INTERPRETING PHYSICIAN:  Larey Seat, MD  [facility] ,Medical Director of [facility].  Board certified by The [employer] of Sleep Medicine and Fellow of the [employer] of Neurology.

## 2020-06-28 NOTE — Progress Notes (Signed)
IMPRESSION: Mild OSA (obstructive sleep apnea) is present at AHI 5.6/h and can be treated with CPAP, or dental device. REM sleep almost doubled the AHI.   RECOMMENDATION:  Auto CPAP 5-10 cm water 2 cm EPR and heated humidification, mask of her choice.    INTERPRETING PHYSICIAN:  Larey Seat, MD [facility] ,Medical Director of [facility].

## 2020-06-30 ENCOUNTER — Encounter: Payer: Self-pay | Admitting: Neurology

## 2020-08-23 ENCOUNTER — Other Ambulatory Visit: Payer: Self-pay | Admitting: Family Medicine

## 2020-08-23 DIAGNOSIS — Z1231 Encounter for screening mammogram for malignant neoplasm of breast: Secondary | ICD-10-CM

## 2020-09-07 ENCOUNTER — Encounter: Payer: Self-pay | Admitting: General Surgery

## 2020-10-11 ENCOUNTER — Ambulatory Visit: Payer: Commercial Managed Care - PPO

## 2020-10-28 ENCOUNTER — Ambulatory Visit
Admission: RE | Admit: 2020-10-28 | Discharge: 2020-10-28 | Disposition: A | Discharge status: Home / Self Care | Payer: Commercial Managed Care - PPO | Source: Ambulatory Visit | Attending: Family Medicine | Admitting: Family Medicine

## 2020-10-28 ENCOUNTER — Other Ambulatory Visit: Payer: Self-pay

## 2020-10-28 DIAGNOSIS — Z1231 Encounter for screening mammogram for malignant neoplasm of breast: Secondary | ICD-10-CM

## 2020-10-28 LAB — HM MAMMOGRAPHY

## 2021-01-15 ENCOUNTER — Other Ambulatory Visit: Payer: Self-pay | Admitting: Family Medicine

## 2021-03-09 ENCOUNTER — Encounter: Payer: Commercial Managed Care - PPO | Admitting: Family Medicine

## 2021-04-21 LAB — HM PAP SMEAR

## 2021-04-21 LAB — RESULTS CONSOLE HPV: CHL HPV: NEGATIVE

## 2021-08-19 ENCOUNTER — Ambulatory Visit (INDEPENDENT_AMBULATORY_CARE_PROVIDER_SITE_OTHER): Payer: Commercial Managed Care - PPO | Admitting: Family Medicine

## 2021-08-19 ENCOUNTER — Encounter: Payer: Self-pay | Admitting: Family Medicine

## 2021-08-19 VITALS — BP 123/79 | HR 82 | Temp 98.5°F | Ht 66.75 in | Wt 162.0 lb

## 2021-08-19 DIAGNOSIS — Z1231 Encounter for screening mammogram for malignant neoplasm of breast: Secondary | ICD-10-CM

## 2021-08-19 DIAGNOSIS — E0789 Other specified disorders of thyroid: Secondary | ICD-10-CM | POA: Diagnosis not present

## 2021-08-19 DIAGNOSIS — M8580 Other specified disorders of bone density and structure, unspecified site: Secondary | ICD-10-CM

## 2021-08-19 DIAGNOSIS — D630 Anemia in neoplastic disease: Secondary | ICD-10-CM

## 2021-08-19 DIAGNOSIS — C50212 Malignant neoplasm of upper-inner quadrant of left female breast: Secondary | ICD-10-CM | POA: Diagnosis not present

## 2021-08-19 DIAGNOSIS — Z170 Estrogen receptor positive status [ER+]: Secondary | ICD-10-CM

## 2021-08-19 DIAGNOSIS — R7401 Elevation of levels of liver transaminase levels: Secondary | ICD-10-CM

## 2021-08-19 DIAGNOSIS — M9904 Segmental and somatic dysfunction of sacral region: Secondary | ICD-10-CM

## 2021-08-19 DIAGNOSIS — Z131 Encounter for screening for diabetes mellitus: Secondary | ICD-10-CM

## 2021-08-19 DIAGNOSIS — Z0000 Encounter for general adult medical examination without abnormal findings: Secondary | ICD-10-CM | POA: Diagnosis not present

## 2021-08-19 LAB — LIPID PANEL
Cholesterol: 214 mg/dL — ABNORMAL HIGH (ref 0–200)
HDL: 43.4 mg/dL (ref >39.00)
LDL Cholesterol: 142 mg/dL — ABNORMAL HIGH (ref 0–99)
NonHDL: 170.31
Total CHOL/HDL Ratio: 5
Triglycerides: 143 mg/dL (ref 0.0–149.0)
VLDL: 28.6 mg/dL (ref 0.0–40.0)

## 2021-08-19 LAB — COMPREHENSIVE METABOLIC PANEL
ALT: 22 U/L (ref 0–35)
AST: 18 U/L (ref 0–37)
Albumin: 4.3 g/dL (ref 3.5–5.2)
Alkaline Phosphatase: 76 U/L (ref 39–117)
BUN: 14 mg/dL (ref 6–23)
CO2: 23 mEq/L (ref 19–32)
Calcium: 8.6 mg/dL (ref 8.4–10.5)
Chloride: 105 mEq/L (ref 96–112)
Creatinine, Ser: 0.79 mg/dL (ref 0.40–1.20)
GFR: 80.09 mL/min (ref >60.00)
Glucose, Bld: 90 mg/dL (ref 70–99)
Potassium: 4.4 mEq/L (ref 3.5–5.1)
Sodium: 138 mEq/L (ref 135–145)
Total Bilirubin: 0.5 mg/dL (ref 0.2–1.2)
Total Protein: 6.8 g/dL (ref 6.0–8.3)

## 2021-08-19 LAB — CBC
HCT: 40.1 % (ref 36.0–46.0)
Hemoglobin: 13.5 g/dL (ref 12.0–15.0)
MCHC: 33.7 g/dL (ref 30.0–36.0)
MCV: 94.8 fl (ref 78.0–100.0)
Platelets: 261 10*3/uL (ref 150.0–400.0)
RBC: 4.23 Mil/uL (ref 3.87–5.11)
RDW: 12.9 % (ref 11.5–15.5)
WBC: 4.8 10*3/uL (ref 4.0–10.5)

## 2021-08-19 LAB — HEMOGLOBIN A1C: Hgb A1c MFr Bld: 5.3 % (ref 4.6–6.5)

## 2021-08-19 LAB — TSH: TSH: 1.92 u[IU]/mL (ref 0.35–5.50)

## 2021-08-19 LAB — VITAMIN D 25 HYDROXY (VIT D DEFICIENCY, FRACTURES): VITD: 23.25 ng/mL — ABNORMAL LOW (ref 30.00–100.00)

## 2021-08-19 NOTE — Patient Instructions (Signed)
?Great to see you today.  ?I have refilled the medication(s) we provide.  ? ?If labs were collected, we will inform you of lab results once received either by echart message or telephone call.  ? - echart message- for normal results that have been seen by the patient already.  ? - telephone call: abnormal results or if patient has not viewed results in their echart. ? ?Health Maintenance, Female ?Adopting a healthy lifestyle and getting preventive care are important in promoting health and wellness. Ask your health care provider about: ?The right schedule for you to have regular tests and exams. ?Things you can do on your own to prevent diseases and keep yourself healthy. ?What should I know about diet, weight, and exercise? ?Eat a healthy diet ? ?Eat a diet that includes plenty of vegetables, fruits, low-fat dairy products, and lean protein. ?Do not eat a lot of foods that are high in solid fats, added sugars, or sodium. ?Maintain a healthy weight ?Body mass index (BMI) is used to identify weight problems. It estimates body fat based on height and weight. Your health care provider can help determine your BMI and help you achieve or maintain a healthy weight. ?Get regular exercise ?Get regular exercise. This is one of the most important things you can do for your health. Most adults should: ?Exercise for at least 150 minutes each week. The exercise should increase your heart rate and make you sweat (moderate-intensity exercise). ?Do strengthening exercises at least twice a week. This is in addition to the moderate-intensity exercise. ?Spend less time sitting. Even light physical activity can be beneficial. ?Watch cholesterol and blood lipids ?Have your blood tested for lipids and cholesterol at 63 years of age, then have this test every 5 years. ?Have your cholesterol levels checked more often if: ?Your lipid or cholesterol levels are high. ?You are older than 63 years of age. ?You are at high risk for heart  disease. ?What should I know about cancer screening? ?Depending on your health history and family history, you may need to have cancer screening at various ages. This may include screening for: ?Breast cancer. ?Cervical cancer. ?Colorectal cancer. ?Skin cancer. ?Lung cancer. ?What should I know about heart disease, diabetes, and high blood pressure? ?Blood pressure and heart disease ?High blood pressure causes heart disease and increases the risk of stroke. This is more likely to develop in people who have high blood pressure readings or are overweight. ?Have your blood pressure checked: ?Every 3-5 years if you are 63-69 years of age. ?Every year if you are 63 years old or older. ?Diabetes ?Have regular diabetes screenings. This checks your fasting blood sugar level. Have the screening done: ?Once every three years after age 55 if you are at a normal weight and have a low risk for diabetes. ?More often and at a younger age if you are overweight or have a high risk for diabetes. ?What should I know about preventing infection? ?Hepatitis B ?If you have a higher risk for hepatitis B, you should be screened for this virus. Talk with your health care provider to find out if you are at risk for hepatitis B infection. ?Hepatitis C ?Testing is recommended for: ?Everyone born from [birth year] through 1967. ?Anyone with known risk factors for hepatitis C. ?Sexually transmitted infections (STIs) ?Get screened for STIs, including gonorrhea and chlamydia, if: ?You are sexually active and are younger than 63 years of age. ?You are older than 63 years of age and your health care provider  tells you that you are at risk for this type of infection. ?Your sexual activity has changed since you were last screened, and you are at increased risk for chlamydia or gonorrhea. Ask your health care provider if you are at risk. ?Ask your health care provider about whether you are at high risk for HIV. Your health care provider may recommend a  prescription medicine to help prevent HIV infection. If you choose to take medicine to prevent HIV, you should first get tested for HIV. You should then be tested every 3 months for as long as you are taking the medicine. ?Pregnancy ?If you are about to stop having your period (premenopausal) and you may become pregnant, seek counseling before you get pregnant. ?Take 400 to 800 micrograms (mcg) of folic acid every day if you become pregnant. ?Ask for birth control (contraception) if you want to prevent pregnancy. ?Osteoporosis and menopause ?Osteoporosis is a disease in which the bones lose minerals and strength with aging. This can result in bone fractures. If you are 63 years old or older, or if you are at risk for osteoporosis and fractures, ask your health care provider if you should: ?Be screened for bone loss. ?Take a calcium or vitamin D supplement to lower your risk of fractures. ?Be given hormone replacement therapy (HRT) to treat symptoms of menopause. ?Follow these instructions at home: ?Alcohol use ?Do not drink alcohol if: ?Your health care provider tells you not to drink. ?You are pregnant, may be pregnant, or are planning to become pregnant. ?If you drink alcohol: ?Limit how much you have to: ?0-1 drink a day. ?Know how much alcohol is in your drink. In the [place] one drink equals one 12 oz bottle of beer (355 mL), one 5 oz glass of wine (148 mL), or one 1? oz glass of hard liquor (44 mL). ?Lifestyle ?Do not use any products that contain nicotine or tobacco. These products include cigarettes, chewing tobacco, and vaping devices, such as e-cigarettes. If you need help quitting, ask your health care provider. ?Do not use street drugs. ?Do not share needles. ?Ask your health care provider for help if you need support or information about quitting drugs. ?General instructions ?Schedule regular health, dental, and eye exams. ?Stay current with your vaccines. ?Tell your health care provider if: ?You often  feel depressed. ?You have ever been abused or do not feel safe at home. ?Summary ?Adopting a healthy lifestyle and getting preventive care are important in promoting health and wellness. ?Follow your health care provider's instructions about healthy diet, exercising, and getting tested or screened for diseases. ?Follow your health care provider's instructions on monitoring your cholesterol and blood pressure. ?This information is not intended to replace advice given to you by your health care provider. Make sure you discuss any questions you have with your health care provider. ?Document Revised: 09/06/2020 Document Reviewed: 09/06/2020 ?Elsevier Patient Education ? [place]. ? ?

## 2021-08-19 NOTE — Progress Notes (Signed)
? ?This visit occurred during the SARS-CoV-2 public health emergency.  Safety protocols were in place, including screening questions prior to the visit, additional usage of staff PPE, and extensive cleaning of exam room while observing appropriate contact time as indicated for disinfecting solutions.  ? ? ?Patient ID: [name] [name], female  DOB: Feb 22, 1961, 61 y.o.   MRN: [id number] ?Patient Care Team  ?  Relationship Specialty Notifications Start End  ?Ma Hillock, DO PCP - General Family Medicine  07/16/15   ?Magrinat, Virgie Dad, MD (Inactive) Consulting Physician Oncology  08/10/15   ?[name], MD Consulting Physician Obstetrics and Gynecology  08/10/15   ?Yetta Flock, MD Consulting Physician Gastroenterology  10/30/18   ? ? ?Chief Complaint  ?Patient presents with  ? Annual Exam  ?  Pt is fasting  ? ? ?Subjective: ?[name] [name] is a 61 y.o.  Female  present for CPE  ?All past medical history, surgical history, allergies, family history, immunizations, medications and social history were updated in the electronic medical record today. ?All recent labs, ED visits and hospitalizations within the last year were reviewed. ? ?Health maintenance:  ?Colonoscopy: completed 2019. Dr. Havery Moros. 10 yr. Benign polyp x1 ?Mammogram: 10/28/2020; yearly- [facility] (H/o Left Breast cancer 2015)>ordered ?Cervical cancer screening:Dr. Julien Girt, [facility], last appt last week UTD/hysterectomy for fibroids. ?Immunizations: Declines Flu,  Tdap completed 2017. covid series completed. Shingles completed ?Infectious disease screening: HIV and Hep C completed  ?DEXA: completed at [facility] 03/2020.  Osteopenia. Taking Vit D and Ca use. Estrogen deficiency ?Assistive device: none ?Oxygen NWG:NFAO ?Patient has a Dental home. ?Hospitalizations/ED visits: reviewed ? ?Pt reports pain, she describes more of a discomfort or "pressure" feel in her sacrum. She denies any radiation of pain. Discomfort is worse when seated or transition positions.   ? ? ? ?  08/19/2021  ?  9:04 AM 03/09/2020  ? 10:56 AM 01/30/2019  ?  9:31 AM 11/12/2015  ?  8:25 AM 08/10/2015  ?  2:15 PM  ?Depression screen PHQ 2/9  ?Decreased Interest 0 0 0 0 0  ?Down, Depressed, Hopeless 0 0 0 0 0  ?PHQ - 2 Score 0 0 0 0 0  ? ?   ? View : No data to display.  ?  ?  ?  ? ? ?Immunization History  ?Administered Date(s) Administered  ? Influenza-Unspecified 06/04/2017  ? Moderna Sars-Covid-2 Vaccination 06/26/2019, 07/25/2019  ? Tdap 11/12/2015  ? Zoster Recombinat (Shingrix) 03/09/2020, 06/09/2020  ? ? ?Past Medical History:  ?Diagnosis Date  ? Anxiety disorder   ? Mild: treated by her gynecologist with selective serotonin reuptake inhibitor  ? Arthritis   ? hands and feet  ? Breast cancer ([place]) 09/09/2013  ? Family history of malignant neoplasm of breast   ? Fibroid uterus   ? hysterctomy  ? GERD (gastroesophageal reflux disease)   ? Hot flashes   ? Hyperlipidemia   ? Malignant neoplasm of breast (female), unspecified site 08/07/13  ? 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 Nonspecific abnormal electrocardiogram (ECG) (EKG) 2009  ? Osteoarthritis   ? Palpitations 2009  ? Improved; Secondary to premature ventricular contractions. No problems since-pt states was caring for ailing father, anxiety  ? Personal history of radiation therapy   ? left breast 2015  ? Skin lesion 08/10/2015  ? Right forearm and left shoulder  [facility] dermatology note 08/2015: Dr. Susie Cassette, inflamed seborrheic keratosis left posterior shoulder, no treatment. Actinic keratosis on right arm with cryotherapy. Benign  scattered seborrheic keratosis.  ? Thyroid disease   ? Tick bite of right thigh 2016  ? Use of tamoxifen (Nolvadex) 08/10/2015  ? Started: 03/2014   ? Wears glasses   ? ?No Known Allergies ?Past Surgical History:  ?Procedure Laterality Date  ? ABDOMINAL HYSTERECTOMY  02/07/2011  ? Procedure: HYSTERECTOMY ABDOMINAL;  Surgeon: [name];  Location: [facility];  Service: Gynecology;  Laterality: N/A;  ? BREAST EXCISIONAL  BIOPSY Right 10/07/2014  ? high risk  ? BREAST EXCISIONAL BIOPSY Left 12/24/2015  ? benign  ? BREAST LUMPECTOMY Left 09/09/2013  ? left breast 2015  ? BREAST LUMPECTOMY WITH NEEDLE LOCALIZATION AND AXILLARY LYMPH NODE DISSECTION Left 09/09/2013  ? Procedure: LEFT BREAST LUMPECTOMY WITH NEEDLE LOCALIZATION AND LEFT AXILLARY LYMPH NODE DISSECTION;  Surgeon: [name], MD;  Location: [facility];  Service: General;  Laterality: Left;  ? BREAST LUMPECTOMY WITH RADIOACTIVE SEED LOCALIZATION Right 10/07/2014  ? Procedure: RIGHT BREAST LUMPECTOMY WITH RADIOACTIVE SEED LOCALIZATION;  Surgeon: Excell Seltzer, MD;  Location: [facility];  Service: General;  Laterality: Right;  ? BREAST LUMPECTOMY WITH RADIOACTIVE SEED LOCALIZATION Left 12/24/2015  ? Procedure: LEFT BREAST LUMPECTOMY WITH RADIOACTIVE SEED LOCALIZATION;  Surgeon: Excell Seltzer, MD;  Location: [facility];  Service: General;  Laterality: Left;  ? CESAREAN SECTION    ? DILATION AND CURETTAGE OF UTERUS    ? ENDOMETRIAL ABLATION  2006  ? LAPAROSCOPIC ASSISTED VAGINAL HYSTERECTOMY  02/07/2011  ? Procedure: LAPAROSCOPIC ASSISTED VAGINAL HYSTERECTOMY;  Surgeon: [name];  Location: [facility];  Service: Gynecology;  Laterality: N/A;  ? PORTACATH PLACEMENT Right 09/29/2013  ? Procedure: INSERTION PORT-A-CATH;  Surgeon: [name], MD;  Location: [facility];  Service: General;  Laterality: Right;  ? THYROIDECTOMY, PARTIAL    ? ?Family History  ?Problem Relation Age of Onset  ? Angina Father   ? Aortic aneurysm Father   ? Dementia Father   ? CAD Father   ? Hypertension Other   ? Arrhythmia Other   ?     Uncle- a fib  ? Breast cancer Mother   ?     dx 10s. Died at 21  ? Non-Hodgkin's lymphoma Mother   ? Arrhythmia Maternal Aunt   ?     A fib  ? Breast cancer Maternal Aunt 75  ?     currently 57  ? Thyroid disease Maternal Aunt   ? Arrhythmia Maternal Aunt   ?     A fib  ? Breast cancer Maternal  Aunt   ?     dx 78s; currently 38s  ? Colon cancer Maternal Grandmother   ?     dx 64s; died at 83  ? Thyroid disease Maternal Grandmother   ? Cancer Paternal Uncle   ?     unknown primary in early 20s  ? Breast cancer Maternal Aunt   ?     dx 25s; died in 110s  ? Thyroid disease Maternal Uncle   ? Rectal cancer Paternal Grandmother   ? Stomach cancer Neg Hx   ? Esophageal cancer Neg Hx   ? ?Social History  ? ?Social History Narrative  ? Married, [name]. 2 children [name] and [place])  ? HS graduation; Banker (desk job)  ? Nonsmoker, 3-5 drinks weekly of etoh,  Denies drugs.   ? Drinks caffeine beverages  ? Wears seatbelt, smoke detector in the home  ? Firearms in the home  ? Exercises >  3 x week.   ? Feels safe in relationships.   ? ? ?Allergies as of 08/19/2021   ?No Known Allergies ?  ? ?  ?Medication List  ?  ? ?  ? Accurate as of August 19, 2021  9:42 AM. If you have any questions, ask your nurse or doctor.  ?  ?  ? ?  ? ?STOP taking these medications   ? ?vitamin C 1000 MG tablet ?Stopped by: Howard Pouch, DO ?  ?Vitamin D3 125 MCG (5000 UT) Tabs ?Stopped by: Howard Pouch, DO ?  ?ZINC PO ?Stopped by: Howard Pouch, DO ?  ? ?  ? ?TAKE these medications   ? ?GLUCOSAMINE-CHONDROITIN-MSM-D PO ?  ?OMEPRAZOLE PO ?What changed: Another medication with the same name was removed. Continue taking this medication, and follow the directions you see here. ?Changed by: Howard Pouch, DO ?  ?Vitamin A & D 10000-400 units Tabs ?  ? ?  ? ? ?All past medical history, surgical history, allergies, family history, immunizations andmedications were updated in the EMR today and reviewed under the history and medication portions of their EMR.    ? ?No results found for this or any previous visit (from the past 2160 hour(s)). ? ?MM 3D SCREEN BREAST BILATERAL ? ?Result Date: 10/29/2020 ?CLINICAL DATA:  Screening. EXAM: DIGITAL SCREENING BILATERAL MAMMOGRAM WITH TOMOSYNTHESIS AND CAD TECHNIQUE: Bilateral screening digital  craniocaudal and mediolateral oblique mammograms were obtained. Bilateral screening digital breast tomosynthesis was performed. The images were evaluated with computer-aided detection. COMPARISON:  Previous exam

## 2021-08-22 ENCOUNTER — Telehealth: Payer: Self-pay | Admitting: Family Medicine

## 2021-08-22 ENCOUNTER — Encounter: Payer: Self-pay | Admitting: Family Medicine

## 2021-08-22 DIAGNOSIS — E559 Vitamin D deficiency, unspecified: Secondary | ICD-10-CM

## 2021-08-22 DIAGNOSIS — Z8249 Family history of ischemic heart disease and other diseases of the circulatory system: Secondary | ICD-10-CM

## 2021-08-22 NOTE — Telephone Encounter (Signed)
Please call patient ?Liver, kidney and thyroid function are normal ?Blood cell counts and electrolytes are normal ?Diabetes screening/A1c is normal  ? ?Vitd is low at 23.  I would encourage her to increase her vitamin D3 by 1000 units daily.  Goal vitamin D levels are typically between 30-50. ? ?Cholesterol panel is above goal and higher than last year.  With an LDL/bad cholesterol at 142.  Last year this was 119.  This is getting closer to when we would start recommending a prescribed medication.  Especially with her family history of coronary artery disease.  First, I would recommend routine exercise and dietary changes.  Diet should be full of fresh fruits-vegetables and lean meats.  High in fiber. Avoidance of butter, added salt, saturated and trans fats and alcohol.  ? ?Follow in 6 mos with provider, fasting and we will see how the changes above worked for her and decide if medication is needed at that time.  ? ? ?

## 2021-08-22 NOTE — Telephone Encounter (Signed)
Please advise if this is appropriate for HLD.  ?

## 2021-08-22 NOTE — Telephone Encounter (Signed)
Spoke with pt regarding labs and instructions.   

## 2021-08-24 NOTE — Telephone Encounter (Signed)
Her question of calories is not a straightforward answer.   ?A good app is my fitness pal and a website for daily caloric need is calorie calculator.net ?

## 2021-09-07 NOTE — Progress Notes (Signed)
? ? [name] [name] D.Merril Abbe ?[facility] ?[place] ?Phone: [phone] ?  ?Assessment and Plan:   ?  ?1. Coccydynia ?-Chronic with exacerbation, initial visit ?- Consistent with coccydynia based on area of pain, pain with palpation of coccyx ?- Recommend using coccyx pillow when sitting for prolonged periods of time ?- Start meloxicam 15 mg daily x2 weeks.  If still having pain after 2 weeks, complete 3rd-week of meloxicam. May use remaining meloxicam as needed once daily for pain control.  Do not to use additional NSAIDs while taking meloxicam.  May use Tylenol [phone] mg 2 to 3 times a day for breakthrough pain. ?- Start HEP for sacrum/pelvis ? ?2. Trigger ring finger of right hand ?-Chronic with exacerbation, initial visit ?- Consistent with trigger finger of right ring finger.  Currently very mild symptoms with only occasional triggering, typically worse in the morning ?-- Start meloxicam 15 mg daily x2 weeks.  If still having pain after 2 weeks, complete 3rd-week of meloxicam. May use remaining meloxicam as needed once daily for pain control.  Do not to use additional NSAIDs while taking meloxicam.  May use Tylenol [phone] mg 2 to 3 times a day for breakthrough pain. ?- Advised that triggering will occur less frequently if patient is able to keep ring finger in extension during sleep, though she feels she will have difficulty sleeping with a brace or finger splint on, so declines at this time ?  ?Pertinent previous records reviewed include PCP note 08/19/2021 ?  ?Follow Up: 3 weeks for reevaluation.  Would consider x-ray images if no improvement or worsening of symptoms.  Could also consider OMT ?  ?Subjective:   ?I, Pincus Badder, am serving as a Education administrator for [employer] ? ?Chief Complaint: SI joint pain  ? ?HPI:  ?09/08/2021 ?Patient is a 61 year old female complaining of si joint pain. Patient states pain started around christmas, wake up with  pain in her lower extremities feels like her hip is going to give out , that comes and goes, no MOI, does have a family hx of arthritis, has a tenderness when she sits , laying down isn't a problem only in the morning is when she will notice pain, no clicking or popping, no numbness or tingling, has not been taking ib and tylenol regularly  , walking is not painful  ? ?Relevant Historical Information: GERD, OSA ? ?Additional pertinent review of systems negative. ? ? ?Current Outpatient Medications:  ?  GLUCOSAMINE-CHONDROITIN-MSM-D PO, , Disp: , Rfl:  ?  meloxicam (MOBIC) 15 MG tablet, Take 1 tablet (15 mg total) by mouth daily., Disp: 30 tablet, Rfl: 0 ?  OMEPRAZOLE PO, , Disp: , Rfl:  ?  Vitamins A & D (VITAMIN A & D) 10000-400 units TABS, , Disp: , Rfl:   ? ?Objective:   ?  ?Vitals:  ? 09/08/21 1447  ?BP: 120/84  ?Pulse: 84  ?SpO2: 99%  ?Weight: 160 lb (72.6 kg)  ?Height: '5\' 6"'$  (1.676 m)  ?  ?  ?Body mass index is 25.82 kg/m?.  ?  ?Physical Exam:   ? ?Gen: Appears well, nad, nontoxic and pleasant ?Psych: Alert and oriented, appropriate mood and affect ?Neuro: sensation intact, strength is 5/5 in upper and lower extremities, muscle tone wnl ?Skin: no susupicious lesions or rashes ? ?Back - Normal skin, Spine with normal alignment and no deformity.   ?No tenderness to vertebral process palpation.   ?Paraspinous muscles are not  tender and without spasm ?Straight leg raise negative ?Negative logroll ?Bilaterally negative FABER and FADIR ?TTP coccyx ? ?Right fourth finger: No active triggering.  Mild TTP palmar surface of middle phalanx and MCP.  No palpable nodule ? ?Electronically signed by:  ?[name] [name] D.Merril Abbe ?[facility] ?3:14 PM 09/08/21 ?

## 2021-09-08 ENCOUNTER — Ambulatory Visit (INDEPENDENT_AMBULATORY_CARE_PROVIDER_SITE_OTHER): Payer: Commercial Managed Care - PPO | Admitting: Sports Medicine

## 2021-09-08 VITALS — BP 120/84 | HR 84 | Ht 66.0 in | Wt 160.0 lb

## 2021-09-08 DIAGNOSIS — M65341 Trigger finger, right ring finger: Secondary | ICD-10-CM | POA: Diagnosis not present

## 2021-09-08 DIAGNOSIS — M533 Sacrococcygeal disorders, not elsewhere classified: Secondary | ICD-10-CM

## 2021-09-08 MED ORDER — MELOXICAM 15 MG PO TABS: 15.0000 mg | ORAL_TABLET | Freq: Every day | ORAL | 0 refills | Status: DC

## 2021-09-08 NOTE — Patient Instructions (Addendum)
Good to see you ?- Start meloxicam 15 mg daily x2 weeks.  If still having pain after 2 weeks, complete 3rd-week of meloxicam. May use remaining meloxicam as needed once daily for pain control.  Do not to use additional NSAIDs while taking meloxicam.  May use Tylenol [phone] mg 2 to 3 times a day for breakthrough pain. ?Recommend getting over the counter Voltaren gel using painful areas ?SI joint HEP ?Recommend getting a wedge pillow for [employer] (coccyx pillow )  ?3 week follow up  ?

## 2021-12-08 ENCOUNTER — Ambulatory Visit
Admission: RE | Admit: 2021-12-08 | Discharge: 2021-12-08 | Disposition: A | Discharge status: Home / Self Care | Payer: Commercial Managed Care - PPO | Source: Ambulatory Visit | Attending: Family Medicine | Admitting: Family Medicine

## 2021-12-08 DIAGNOSIS — Z1231 Encounter for screening mammogram for malignant neoplasm of breast: Secondary | ICD-10-CM

## 2021-12-13 ENCOUNTER — Ambulatory Visit: Payer: Commercial Managed Care - PPO | Admitting: Physician Assistant

## 2022-04-28 NOTE — Progress Notes (Unsigned)
    [name] [name] D.[name] [name] Phone: [phone]   Assessment and Plan:     There are no diagnoses linked to this encounter.  ***   Pertinent previous records reviewed include ***   Follow Up: ***     Subjective:   I, [name] [name], am serving as a Education administrator for [name] [name]   Chief Complaint: SI joint pain & OMT   HPI:  09/08/2021 Patient is a 61 year old female complaining of si joint pain. Patient states pain started around christmas, wake up with pain in her lower extremities feels like her hip is going to give out , that comes and goes, no MOI, does have a family hx of arthritis, has a tenderness when she sits , laying down isn't a problem only in the morning is when she will notice pain, no clicking or popping, no numbness or tingling, has not been taking ib and tylenol regularly  , walking is not painful   05/02/2022 Patient states    Relevant Historical Information: GERD, OSA  Additional pertinent review of systems negative.   Current Outpatient Medications:    GLUCOSAMINE-CHONDROITIN-MSM-D PO, , Disp: , Rfl:    meloxicam (MOBIC) 15 MG tablet, Take 1 tablet (15 mg total) by mouth daily., Disp: 30 tablet, Rfl: 0   OMEPRAZOLE PO, , Disp: , Rfl:    Vitamins A & D (VITAMIN A & D) 10000-400 units TABS, , Disp: , Rfl:    Objective:     There were no vitals filed for this visit.    There is no height or weight on file to calculate BMI.    Physical Exam:    ***   Electronically signed by:  [name] [name] D.[name] 8:24 AM 04/28/22

## 2022-05-02 ENCOUNTER — Ambulatory Visit (INDEPENDENT_AMBULATORY_CARE_PROVIDER_SITE_OTHER): Payer: Commercial Managed Care - PPO | Admitting: Sports Medicine

## 2022-05-02 VITALS — BP 130/82 | HR 103 | Ht 66.0 in | Wt 158.0 lb

## 2022-05-02 DIAGNOSIS — M9904 Segmental and somatic dysfunction of sacral region: Secondary | ICD-10-CM

## 2022-05-02 DIAGNOSIS — M9905 Segmental and somatic dysfunction of pelvic region: Secondary | ICD-10-CM | POA: Diagnosis not present

## 2022-05-02 DIAGNOSIS — M533 Sacrococcygeal disorders, not elsewhere classified: Secondary | ICD-10-CM

## 2022-05-02 DIAGNOSIS — M65341 Trigger finger, right ring finger: Secondary | ICD-10-CM | POA: Diagnosis not present

## 2022-05-02 DIAGNOSIS — M9903 Segmental and somatic dysfunction of lumbar region: Secondary | ICD-10-CM

## 2022-05-02 NOTE — Patient Instructions (Addendum)
Good to see you  3-4 week follow up  

## 2022-05-29 NOTE — Progress Notes (Unsigned)
Benito Mccreedy D.[place] Phone: [phone]   Assessment and Plan:     There are no diagnoses linked to this encounter.  ***   Pertinent previous records reviewed include ***   Follow Up: ***     Subjective:   I, Kreg Earhart, am serving as a Education administrator for Doctor Glennon Mac   Chief Complaint: SI joint pain & OMT   HPI:  09/08/2021 Patient is a 62 year old female complaining of si joint pain. Patient states pain started around christmas, wake up with pain in her lower extremities feels like her hip is going to give out , that comes and goes, no MOI, does have a family hx of arthritis, has a tenderness when she sits , laying down isn't a problem only in the morning is when she will notice pain, no clicking or popping, no numbness or tingling, has not been taking ib and tylenol regularly  , walking is not painful    05/02/2022 Patient states adjustment and a CSI right hand trigger finger    05/30/2022 Patient states    Relevant Historical Information: GERD, OSA  Additional pertinent review of systems negative.   Current Outpatient Medications:    GLUCOSAMINE-CHONDROITIN-MSM-D PO, , Disp: , Rfl:    meloxicam (MOBIC) 15 MG tablet, Take 1 tablet (15 mg total) by mouth daily., Disp: 30 tablet, Rfl: 0   OMEPRAZOLE PO, , Disp: , Rfl:    Vitamins A & D (VITAMIN A & D) 10000-400 units TABS, , Disp: , Rfl:    Objective:     There were no vitals filed for this visit.    There is no height or weight on file to calculate BMI.    Physical Exam:    ***   Electronically signed by:  Benito Mccreedy D.[name] 11:54 AM 05/29/22

## 2022-05-30 ENCOUNTER — Ambulatory Visit (INDEPENDENT_AMBULATORY_CARE_PROVIDER_SITE_OTHER): Payer: Commercial Managed Care - PPO | Admitting: Sports Medicine

## 2022-05-30 VITALS — BP 132/80 | HR 90 | Ht 66.0 in | Wt 157.0 lb

## 2022-05-30 DIAGNOSIS — M9904 Segmental and somatic dysfunction of sacral region: Secondary | ICD-10-CM

## 2022-05-30 DIAGNOSIS — M65341 Trigger finger, right ring finger: Secondary | ICD-10-CM | POA: Diagnosis not present

## 2022-05-30 DIAGNOSIS — M533 Sacrococcygeal disorders, not elsewhere classified: Secondary | ICD-10-CM

## 2022-05-30 DIAGNOSIS — M9903 Segmental and somatic dysfunction of lumbar region: Secondary | ICD-10-CM | POA: Diagnosis not present

## 2022-05-30 DIAGNOSIS — M9905 Segmental and somatic dysfunction of pelvic region: Secondary | ICD-10-CM | POA: Diagnosis not present

## 2022-05-30 NOTE — Patient Instructions (Signed)
Good to see you   

## 2022-11-29 ENCOUNTER — Other Ambulatory Visit: Payer: Self-pay | Admitting: Obstetrics and Gynecology

## 2022-11-29 DIAGNOSIS — Z1231 Encounter for screening mammogram for malignant neoplasm of breast: Secondary | ICD-10-CM

## 2022-12-01 ENCOUNTER — Ambulatory Visit (INDEPENDENT_AMBULATORY_CARE_PROVIDER_SITE_OTHER): Payer: Commercial Managed Care - PPO | Admitting: Family Medicine

## 2022-12-01 ENCOUNTER — Encounter: Payer: Self-pay | Admitting: Family Medicine

## 2022-12-01 VITALS — BP 123/81 | HR 72 | Temp 97.7°F | Wt 159.2 lb

## 2022-12-01 DIAGNOSIS — E0789 Other specified disorders of thyroid: Secondary | ICD-10-CM

## 2022-12-01 DIAGNOSIS — E041 Nontoxic single thyroid nodule: Secondary | ICD-10-CM | POA: Diagnosis not present

## 2022-12-01 DIAGNOSIS — R499 Unspecified voice and resonance disorder: Secondary | ICD-10-CM | POA: Diagnosis not present

## 2022-12-01 LAB — T4, FREE: Free T4: 0.96 ng/dL (ref 0.60–1.60)

## 2022-12-01 LAB — TSH: TSH: 1.65 u[IU]/mL (ref 0.35–5.50)

## 2022-12-01 MED ORDER — OMEPRAZOLE 20 MG PO CPDR: 20.0000 mg | DELAYED_RELEASE_CAPSULE | Freq: Every day | ORAL | 1 refills | Status: DC

## 2022-12-01 NOTE — Progress Notes (Signed)
[name] [name] , 04/19/1961, 62 y.o., female MRN: [id number] Patient Care Team    Relationship Specialty Notifications Start End  Natalia Leatherwood, DO PCP - General Family Medicine  07/16/15   Magrinat, Valentino Hue, MD (Inactive) Consulting Physician Oncology  08/10/15   Zelphia Cairo, MD Consulting Physician Obstetrics and Gynecology  08/10/15   Armbruster, Willaim Rayas, MD Consulting Physician Gastroenterology  10/30/18     Chief Complaint  Patient presents with   Cough    Several months; has never been tested for COVID; dry cough, hoarseness and "weak voice"     Subjective: [name] [name] is a 62 y.o. Pt presents for an OV with complaints of cough of dry cough and weak voice of greater than 6 months duration.  She reports the dry cough can occur anytime is not associated with anything in specific.  Patient reports her husband and daughter have noticed and made comments of her weak voice.   Patient's husband is with her today and states he finds it concerning because her voice sounded like this when she was on treatment for her breast cancer.  She has a history of thyroid nodules and fullness and has had a portion of her thyroid removed in the past.  She does not feel any neck pressure currently. History of GERD, which she currently is not taking medications.  She states she has made a lot of changes in her diet, but does admit she has occasional heartburn/reflux symptoms at night.  She endorses sometimes feeling "strangled "when drinking liquids. Never smoker. She wears a mouthpiece for her sleep apnea at night.  But denies dry throat when she wakes up.     12/01/2022   10:49 AM 08/19/2021    9:04 AM 03/09/2020   10:56 AM 01/30/2019    9:31 AM 11/12/2015    8:25 AM  Depression screen PHQ 2/9  Decreased Interest 0 0 0 0 0  Down, Depressed, Hopeless 0 0 0 0 0  PHQ - 2 Score 0 0 0 0 0    No Known Allergies Social History   Social History Narrative   Married, [name] [name]. 2 children [name] [name]  and [name] [name])   HS graduation; [employer] employee (desk job)   Nonsmoker, 3-5 drinks weekly of etoh,  Denies drugs.    Drinks caffeine beverages   Wears seatbelt, smoke detector in the home   Firearms in the home   Exercises >3 x week.    Feels safe in relationships.    Past Medical History:  Diagnosis Date   Anxiety disorder    Mild: treated by her gynecologist with selective serotonin reuptake inhibitor   Arthritis    hands and feet   Breast cancer (HCC) 09/09/2013   Family history of malignant neoplasm of breast    Fibroid uterus    hysterctomy   GERD (gastroesophageal reflux disease)    Hot flashes    Hyperlipidemia    Malignant neoplasm of breast (female), unspecified site 08/07/2013   left breast invasive mammary ca in situ   Nonspecific abnormal electrocardiogram (ECG) (EKG) 2009   Osteoarthritis    Palpitations 2009   Improved; Secondary to premature ventricular contractions. No problems since-pt states was caring for ailing father, anxiety   Personal history of radiation therapy    left breast 2015   Psychophysiological insomnia 04/15/2020   Skin lesion 08/10/2015   Right forearm and left shoulder  [facility] [facility]ology note 08/2015: Dr. Lenis Dickinson, inflamed seborrheic keratosis  left posterior shoulder, no treatment. Actinic keratosis on right arm with cryotherapy. Benign scattered seborrheic keratosis.   Thyroid disease    Tick bite of right thigh 2016   Use of tamoxifen (Nolvadex) 08/10/2015   Started: 03/2014    Wears glasses    Past Surgical History:  Procedure Laterality Date   ABDOMINAL HYSTERECTOMY  02/07/2011   Procedure: HYSTERECTOMY ABDOMINAL;  Surgeon: Zelphia Cairo;  Location: [facility];  Service: Gynecology;  Laterality: N/A;   BREAST EXCISIONAL BIOPSY Right 10/07/2014   high risk   BREAST EXCISIONAL BIOPSY Left 12/24/2015   benign   BREAST LUMPECTOMY Left 09/09/2013   left breast 2015   BREAST LUMPECTOMY WITH NEEDLE LOCALIZATION AND AXILLARY LYMPH NODE  DISSECTION Left 09/09/2013   Procedure: LEFT BREAST LUMPECTOMY WITH NEEDLE LOCALIZATION AND LEFT AXILLARY LYMPH NODE DISSECTION;  Surgeon: Mariella Saa, MD;  Location: [facility];  Service: General;  Laterality: Left;   BREAST LUMPECTOMY WITH RADIOACTIVE SEED LOCALIZATION Right 10/07/2014   Procedure: RIGHT BREAST LUMPECTOMY WITH RADIOACTIVE SEED LOCALIZATION;  Surgeon: Glenna Fellows, MD;  Location: [facility];  Service: General;  Laterality: Right;   BREAST LUMPECTOMY WITH RADIOACTIVE SEED LOCALIZATION Left 12/24/2015   Procedure: LEFT BREAST LUMPECTOMY WITH RADIOACTIVE SEED LOCALIZATION;  Surgeon: Glenna Fellows, MD;  Location: [facility];  Service: General;  Laterality: Left;   CESAREAN SECTION     DILATION AND CURETTAGE OF UTERUS     ENDOMETRIAL ABLATION  2006   LAPAROSCOPIC ASSISTED VAGINAL HYSTERECTOMY  02/07/2011   Procedure: LAPAROSCOPIC ASSISTED VAGINAL HYSTERECTOMY;  Surgeon: Zelphia Cairo;  Location: [facility];  Service: Gynecology;  Laterality: N/A;   PORTACATH PLACEMENT Right 09/29/2013   Procedure: INSERTION PORT-A-CATH;  Surgeon: Mariella Saa, MD;  Location: [facility];  Service: General;  Laterality: Right;   THYROIDECTOMY, PARTIAL     Family History  Problem Relation Age of Onset   Angina Father    Aortic aneurysm Father    Dementia Father    CAD Father    Hypertension Other    Arrhythmia Other        Uncle- a fib   Breast cancer Mother        dx 51s. Died at 78   Non-Hodgkin's lymphoma Mother    Arrhythmia Maternal Aunt        A fib   Breast cancer Maternal Aunt 68       currently 17   Thyroid disease Maternal Aunt    Arrhythmia Maternal Aunt        A fib   Breast cancer Maternal Aunt        dx 57s; currently 6s   Colon cancer Maternal Grandmother        dx 26s; died at 34   Thyroid disease Maternal Grandmother    Cancer Paternal Uncle        unknown primary in early 72s   Breast  cancer Maternal Aunt        dx 29s; died in 74s   Thyroid disease Maternal Uncle    Rectal cancer Paternal Grandmother    Stomach cancer Neg Hx    Esophageal cancer Neg Hx    Allergies as of 12/01/2022   No Known Allergies      Medication List        Accurate as of December 01, 2022 10:57 AM. If you have any questions, ask your nurse or doctor.  STOP taking these medications    GLUCOSAMINE-CHONDROITIN-MSM-D PO Stopped by: Felix Pacini   meloxicam 15 MG tablet Commonly known as: MOBIC Stopped by: Felix Pacini       TAKE these medications    OMEPRAZOLE PO   Vitamin A & D 10000-400 units Tabs        All past medical history, surgical history, allergies, family history, immunizations andmedications were updated in the EMR today and reviewed under the history and medication portions of their EMR.     ROS Negative, with the exception of above mentioned in HPI   Objective:  BP 123/81   Pulse 72   Temp 97.7 F (36.5 C)   Wt 159 lb 3.2 oz (72.2 kg)   SpO2 98%   BMI 25.70 kg/m  Body mass index is 25.7 kg/m. Physical Exam Vitals and nursing note reviewed.  Constitutional:      General: She is not in acute distress.    Appearance: Normal appearance. She is normal weight. She is not ill-appearing or toxic-appearing.  HENT:     Head: Normocephalic and atraumatic.     Right Ear: Tympanic membrane and ear canal normal.     Left Ear: Tympanic membrane and ear canal normal.     Nose: No congestion or rhinorrhea.     Comments: No pnd    Mouth/Throat:     Mouth: Mucous membranes are moist.     Pharynx: No oropharyngeal exudate or posterior oropharyngeal erythema.  Eyes:     General: No scleral icterus.       Right eye: No discharge.        Left eye: No discharge.     Extraocular Movements: Extraocular movements intact.     Conjunctiva/sclera: Conjunctivae normal.     Pupils: Pupils are equal, round, and reactive to light.  Neck:     Comments: Thyroid  enlargement present Abdominal:     General: There is no distension.     Palpations: Abdomen is soft.     Tenderness: There is no abdominal tenderness.  Musculoskeletal:     Cervical back: Neck supple. No tenderness.  Skin:    Findings: No rash.  Neurological:     Mental Status: She is alert and oriented to person, place, and time. Mental status is at baseline.     Motor: No weakness.     Coordination: Coordination normal.     Gait: Gait normal.  Psychiatric:        Mood and Affect: Mood normal.        Behavior: Behavior normal.        Thought Content: Thought content normal.        Judgment: Judgment normal.     No results found. No results found. No results found for this or any previous visit (from the past 24 hour(s)).  Assessment/Plan: Monquie KAYDIE PETSCH is a 62 y.o. female present for OV for  Change in voice Discussed different causes for voice change and dry cough.  She does have a history of GERD in the past with some symptoms occasionally still present. Restart omeprazole 20 mg daily on empty stomach. She has a history of thyroid nodules-will obtain thyroid labs and repeat thyroid ultrasound (last 2017). Referral to ENT placed today to further evaluate vocal cords/change in voice. - TSH - T4, free - Ambulatory referral to ENT - US THYROID; Future  Thyroid fullness/nodule - US THYROID; Future -Rule out enlargement of thyroid nodule/cyst causing change in voice  Reviewed expectations  re: course of current medical issues. Discussed self-management of symptoms. Outlined signs and symptoms indicating need for more acute intervention. Patient verbalized understanding and all questions were answered. Patient received an After-Visit Summary.    No orders of the defined types were placed in this encounter.  No orders of the defined types were placed in this encounter.  Referral Orders  No referral(s) requested today     Note is dictated utilizing voice recognition  software. Although note has been proof read prior to signing, occasional typographical errors still can be missed. If any questions arise, please do not hesitate to call for verification.   electronically signed by:  Felix Pacini, DO  [facility] - [place]

## 2022-12-01 NOTE — Patient Instructions (Signed)

## 2022-12-05 ENCOUNTER — Ambulatory Visit (HOSPITAL_BASED_OUTPATIENT_CLINIC_OR_DEPARTMENT_OTHER)
Admission: RE | Admit: 2022-12-05 | Discharge: 2022-12-05 | Disposition: A | Discharge status: Home / Self Care | Payer: Commercial Managed Care - PPO | Source: Ambulatory Visit | Attending: Family Medicine | Admitting: Family Medicine

## 2022-12-05 DIAGNOSIS — E0789 Other specified disorders of thyroid: Secondary | ICD-10-CM | POA: Diagnosis present

## 2022-12-05 DIAGNOSIS — E041 Nontoxic single thyroid nodule: Secondary | ICD-10-CM | POA: Diagnosis present

## 2022-12-05 DIAGNOSIS — R499 Unspecified voice and resonance disorder: Secondary | ICD-10-CM | POA: Insufficient documentation

## 2022-12-06 ENCOUNTER — Telehealth: Payer: Self-pay | Admitting: Family Medicine

## 2022-12-06 DIAGNOSIS — E041 Nontoxic single thyroid nodule: Secondary | ICD-10-CM

## 2022-12-06 DIAGNOSIS — R499 Unspecified voice and resonance disorder: Secondary | ICD-10-CM

## 2022-12-06 NOTE — Telephone Encounter (Signed)
Please call patient: Her thyroid ultrasound has returned and there is 1 nodule that is new and meets criteria for biopsy. Options are that we can order a thyroid biopsy to be done at outpatient radiology and refer her to endocrine if needed after results.  Or we can refer her to endocrine for them to evaluate her, order biopsy etc.    If we order the biopsy, I would have her follow-up 1 week after biopsy with this provider, to discuss results and further plan.  This can be virtual if desired.   -If this is what she chooses, when she receives the date of her biopsy, then she is to call in and schedule an appointment with [place] 1 week after.   If she chooses endocrine, please ask her if she has an endocrinologist in line she would like referred.

## 2022-12-06 NOTE — Telephone Encounter (Signed)
Spoke with patient regarding results/recommendations.    Pt would like [place] to do biopsy.

## 2022-12-07 ENCOUNTER — Encounter: Payer: Self-pay | Admitting: Family Medicine

## 2022-12-07 NOTE — Addendum Note (Signed)
Addended by: Felix Pacini A on: 12/07/2022 09:43 AM   Modules accepted: Orders

## 2022-12-07 NOTE — Telephone Encounter (Signed)
Placed order for thyroid biopsy [facility]

## 2022-12-08 NOTE — Telephone Encounter (Signed)
Ultrasound completed 8/6 at [facility]

## 2022-12-14 ENCOUNTER — Encounter (INDEPENDENT_AMBULATORY_CARE_PROVIDER_SITE_OTHER): Payer: Self-pay

## 2022-12-15 ENCOUNTER — Ambulatory Visit: Payer: Commercial Managed Care - PPO

## 2022-12-15 ENCOUNTER — Ambulatory Visit
Admission: RE | Admit: 2022-12-15 | Discharge: 2022-12-15 | Disposition: A | Discharge status: Home / Self Care | Payer: Commercial Managed Care - PPO | Source: Ambulatory Visit | Attending: Obstetrics and Gynecology | Admitting: Obstetrics and Gynecology

## 2022-12-15 DIAGNOSIS — Z1231 Encounter for screening mammogram for malignant neoplasm of breast: Secondary | ICD-10-CM

## 2022-12-28 ENCOUNTER — Ambulatory Visit
Admission: RE | Admit: 2022-12-28 | Discharge: 2022-12-28 | Disposition: A | Discharge status: Home / Self Care | Payer: Commercial Managed Care - PPO | Source: Ambulatory Visit | Attending: Family Medicine | Admitting: Family Medicine

## 2022-12-28 ENCOUNTER — Other Ambulatory Visit (HOSPITAL_COMMUNITY)
Admission: RE | Admit: 2022-12-28 | Discharge: 2022-12-28 | Disposition: A | Discharge status: Home / Self Care | Payer: Commercial Managed Care - PPO | Source: Ambulatory Visit

## 2022-12-28 DIAGNOSIS — R896 Abnormal cytological findings in specimens from other organs, systems and tissues: Secondary | ICD-10-CM | POA: Insufficient documentation

## 2022-12-28 DIAGNOSIS — E041 Nontoxic single thyroid nodule: Secondary | ICD-10-CM

## 2022-12-28 DIAGNOSIS — R499 Unspecified voice and resonance disorder: Secondary | ICD-10-CM

## 2023-01-04 LAB — CYTOLOGY - NON PAP

## 2023-01-08 ENCOUNTER — Encounter: Payer: Self-pay | Admitting: Family Medicine

## 2023-01-08 ENCOUNTER — Telehealth (INDEPENDENT_AMBULATORY_CARE_PROVIDER_SITE_OTHER): Payer: Commercial Managed Care - PPO | Admitting: Family Medicine

## 2023-01-08 VITALS — Ht 67.0 in | Wt 159.0 lb

## 2023-01-08 DIAGNOSIS — R899 Unspecified abnormal finding in specimens from other organs, systems and tissues: Secondary | ICD-10-CM

## 2023-01-08 DIAGNOSIS — E041 Nontoxic single thyroid nodule: Secondary | ICD-10-CM | POA: Diagnosis not present

## 2023-01-08 NOTE — Progress Notes (Unsigned)
VIRTUAL VISIT VIA VIDEO  I connected with [name] [name] on 01/10/23 at 10:20 AM EDT by a video enabled telemedicine application and verified that I am speaking with the correct person using two identifiers. Location patient: Home Location provider: [facility], Office Persons participating in the virtual visit: Patient, Dr. Claiborne Billings and Ivonne Andrew, CMA  I discussed the limitations of evaluation and management by telemedicine and the availability of in person appointments. The patient expressed understanding and agreed to proceed.     [name] [name] , Nov 04, 1960, 62 y.o., female MRN: [id number] Patient Care Team    Relationship Specialty Notifications Start End  Natalia Leatherwood, DO PCP - General Family Medicine  07/16/15   Magrinat, Valentino Hue, MD (Inactive) Consulting Physician Oncology  08/10/15   Zelphia Cairo, MD Consulting Physician Obstetrics and Gynecology  08/10/15   Armbruster, Willaim Rayas, MD Consulting Physician Gastroenterology  10/30/18     Chief Complaint  Patient presents with   Follow-up    Virtual- Follow up on Thyroid biopsy. She wants to see what the next steps are. She does have records of prevo     Subjective: [name] [name] is a 62 y.o. Pt presents for follow-up on thyroid biopsy.  Results of thyroid biopsy: Atypia of undetermined significance (Bethesda category 3) Afirma test is pending.  She has her established appointment with [facility] in a week.   Prior note. OV with complaints of cough of dry cough and weak voice of greater than 6 months duration.  She reports the dry cough can occur anytime is not associated with anything in specific.  Patient reports her husband and daughter have noticed and made comments of her weak voice.   Patient's husband is with her today and states he finds it concerning because her voice sounded like this when she was on treatment for her breast cancer.  She has a history of thyroid nodules and fullness and has had a portion  of her thyroid removed in the past.  She does not feel any neck pressure currently. History of GERD, which she currently is not taking medications.  She states she has made a lot of changes in her diet, but does admit she has occasional heartburn/reflux symptoms at night.  She endorses sometimes feeling "strangled "when drinking liquids. Never smoker. She wears a mouthpiece for her sleep apnea at night.  But denies dry throat when she wakes up.  [place] FNA BX THYROID 1ST LESION AFIRMA  Result Date: 12/28/2022 INDICATION: Indeterminate thyroid nodule EXAM: ULTRASOUND GUIDED FINE NEEDLE ASPIRATION OF INDETERMINATE THYROID NODULE COMPARISON:  None Available. MEDICATIONS: None COMPLICATIONS: None immediate. TECHNIQUE: Informed written consent was obtained from the patient after a discussion of the risks, benefits and alternatives to treatment. Questions regarding the procedure were encouraged and answered. A timeout was performed prior to the initiation of the procedure. Pre-procedural ultrasound scanning demonstrated unchanged size and appearance of the indeterminate nodule within the right superior gland The procedure was planned. The neck was prepped in the usual sterile fashion, and a sterile drape was applied covering the operative field. A timeout was performed prior to the initiation of the procedure. Local anesthesia was provided with 1% lidocaine. Under direct ultrasound guidance, 5 FNA biopsies were performed of the nodule with a 25 gauge needle. Two biopsy samples were reserved for future Afirma testing. Multiple ultrasound images were saved for procedural documentation purposes. The samples were prepared and submitted to pathology. Limited post procedural  scanning was negative for hematoma or additional complication. Dressings were placed. The patient tolerated the above procedures procedure well without immediate postprocedural complication. FINDINGS: Nodule reference number based on prior diagnostic  ultrasound: 1 Maximum size: 1.6 cm Location: Right; Superior ACR TI-RADS risk category: TR4 (4-6 points) Reason for biopsy: meets ACR TI-RADS criteria Ultrasound imaging confirms appropriate placement of the needles within the thyroid nodule. IMPRESSION: Technically successful ultrasound guided fine needle aspiration of right superior thyroid nodule. Electronically Signed   By: Malachy Moan M.D.   On: 12/28/2022 15:57   US THYROID Result Date: 12/06/2022 IMPRESSION: New right superior thyroid nodule (labeled 1, 1.6 cm, TR 4) meets criteria for biopsy, as designated by the newly established ACR TI-RADS criteria, and referral for biopsy is recommended. Left hemithyroidectomy. Recommendations follow those established by the new ACR TI-RADS criteria (J Am Coll Radiol 2017;14:587-595). Electronically Signed   By: Gilmer Mor D.O.   On: 12/06/2022 09:30        12/01/2022   10:49 AM 08/19/2021    9:04 AM 03/09/2020   10:56 AM 01/30/2019    9:31 AM 11/12/2015    8:25 AM  Depression screen PHQ 2/9  Decreased Interest 0 0 0 0 0  Down, Depressed, Hopeless 0 0 0 0 0  PHQ - 2 Score 0 0 0 0 0    No Known Allergies Social History   Social History Narrative   Married, Onalee Hua. 2 children Arlys John and [place])   HS graduation; [employer] employee (desk job)   Nonsmoker, 3-5 drinks weekly of etoh,  Denies drugs.    Drinks caffeine beverages   Wears seatbelt, smoke detector in the home   Firearms in the home   Exercises >3 x week.    Feels safe in relationships.    Past Medical History:  Diagnosis Date   Anxiety disorder    Mild: treated by her gynecologist with selective serotonin reuptake inhibitor   Arthritis    hands and feet   Breast cancer (HCC) 09/09/2013   Family history of malignant neoplasm of breast    Fibroid uterus    hysterctomy   GERD (gastroesophageal reflux disease)    Hot flashes    Hyperlipidemia    Malignant neoplasm of breast (female), unspecified site 08/07/2013   left  breast invasive mammary ca in situ   Nonspecific abnormal electrocardiogram (ECG) (EKG) 2009   Osteoarthritis    Palpitations 2009   Improved; Secondary to premature ventricular contractions. No problems since-pt states was caring for ailing father, anxiety   Personal history of radiation therapy    left breast 2015   Psychophysiological insomnia 04/15/2020   Skin lesion 08/10/2015   Right forearm and left shoulder  [place] dermatology note 08/2015: Dr. Lenis Dickinson, inflamed seborrheic keratosis left posterior shoulder, no treatment. Actinic keratosis on right arm with cryotherapy. Benign scattered seborrheic keratosis.   Thyroid disease    Tick bite of right thigh 2016   Use of tamoxifen (Nolvadex) 08/10/2015   Started: 03/2014    Wears glasses    Past Surgical History:  Procedure Laterality Date   ABDOMINAL HYSTERECTOMY  02/07/2011   Procedure: HYSTERECTOMY ABDOMINAL;  Surgeon: Zelphia Cairo;  Location: [facility];  Service: Gynecology;  Laterality: N/A;   BREAST EXCISIONAL BIOPSY Right 10/07/2014   high risk   BREAST EXCISIONAL BIOPSY Left 12/24/2015   benign   BREAST LUMPECTOMY Left 09/09/2013   left breast 2015   BREAST LUMPECTOMY WITH NEEDLE LOCALIZATION AND AXILLARY LYMPH NODE DISSECTION Left  09/09/2013   Procedure: LEFT BREAST LUMPECTOMY WITH NEEDLE LOCALIZATION AND LEFT AXILLARY LYMPH NODE DISSECTION;  Surgeon: Mariella Saa, MD;  Location: [facility];  Service: General;  Laterality: Left;   BREAST LUMPECTOMY WITH RADIOACTIVE SEED LOCALIZATION Right 10/07/2014   Procedure: RIGHT BREAST LUMPECTOMY WITH RADIOACTIVE SEED LOCALIZATION;  Surgeon: Glenna Fellows, MD;  Location: [facility];  Service: General;  Laterality: Right;   BREAST LUMPECTOMY WITH RADIOACTIVE SEED LOCALIZATION Left 12/24/2015   Procedure: LEFT BREAST LUMPECTOMY WITH RADIOACTIVE SEED LOCALIZATION;  Surgeon: Glenna Fellows, MD;  Location: [facility];  Service: General;   Laterality: Left;   CESAREAN SECTION     DILATION AND CURETTAGE OF UTERUS     ENDOMETRIAL ABLATION  2006   LAPAROSCOPIC ASSISTED VAGINAL HYSTERECTOMY  02/07/2011   Procedure: LAPAROSCOPIC ASSISTED VAGINAL HYSTERECTOMY;  Surgeon: Zelphia Cairo;  Location: [facility];  Service: Gynecology;  Laterality: N/A;   PORTACATH PLACEMENT Right 09/29/2013   Procedure: INSERTION PORT-A-CATH;  Surgeon: Mariella Saa, MD;  Location: [facility];  Service: General;  Laterality: Right;   THYROIDECTOMY, PARTIAL     Family History  Problem Relation Age of Onset   Angina Father    Aortic aneurysm Father    Dementia Father    CAD Father    Hypertension Other    Arrhythmia Other        Uncle- a fib   Breast cancer Mother        dx 5s. Died at 40   Non-Hodgkin's lymphoma Mother    Arrhythmia Maternal Aunt        A fib   Breast cancer Maternal Aunt 57       currently 68   Thyroid disease Maternal Aunt    Arrhythmia Maternal Aunt        A fib   Breast cancer Maternal Aunt        dx 27s; currently 71s   Colon cancer Maternal Grandmother        dx 83s; died at 59   Thyroid disease Maternal Grandmother    Cancer Paternal Uncle        unknown primary in early 12s   Breast cancer Maternal Aunt        dx 28s; died in 27s   Thyroid disease Maternal Uncle    Rectal cancer Paternal Grandmother    Stomach cancer Neg Hx    Esophageal cancer Neg Hx    Allergies as of 01/08/2023   No Known Allergies      Medication List        Accurate as of January 08, 2023 11:59 PM. If you have any questions, ask your nurse or doctor.          omeprazole 20 MG capsule Commonly known as: PRILOSEC Take 1 capsule (20 mg total) by mouth daily.   Vitamin A & D 10000-400 units Tabs   vitamin C 1000 MG tablet Take 1,000 mg by mouth daily.        All past medical history, surgical history, allergies, family history, immunizations andmedications were updated in the EMR today and reviewed  under the history and medication portions of their EMR.     ROS Negative, with the exception of above mentioned in HPI   Objective:  Ht 5\' 7"  (1.702 m)   Wt 159 lb (72.1 kg)   BMI 24.90 kg/m  Body mass index is 24.9 kg/m. Physical Exam Vitals and nursing note reviewed.  Constitutional:  General: She is not in acute distress.    Appearance: Normal appearance. She is normal weight. She is not ill-appearing or toxic-appearing.  HENT:     Head: Normocephalic and atraumatic.     Right Ear: Tympanic membrane and ear canal normal.     Left Ear: Tympanic membrane and ear canal normal.     Nose: No congestion or rhinorrhea.     Comments: No pnd    Mouth/Throat:     Mouth: Mucous membranes are moist.     Pharynx: No oropharyngeal exudate or posterior oropharyngeal erythema.  Eyes:     General: No scleral icterus.       Right eye: No discharge.        Left eye: No discharge.     Extraocular Movements: Extraocular movements intact.     Conjunctiva/sclera: Conjunctivae normal.     Pupils: Pupils are equal, round, and reactive to light.  Neck:     Comments: Thyroid enlargement present Abdominal:     General: There is no distension.     Palpations: Abdomen is soft.     Tenderness: There is no abdominal tenderness.  Musculoskeletal:     Cervical back: Neck supple. No tenderness.  Skin:    Findings: No rash.  Neurological:     Mental Status: She is alert and oriented to person, place, and time. Mental status is at baseline.     Motor: No weakness.     Coordination: Coordination normal.     Gait: Gait normal.  Psychiatric:        Mood and Affect: Mood normal.        Behavior: Behavior normal.        Thought Content: Thought content normal.        Judgment: Judgment normal.     No results found. No results found. No results found for this or any previous visit (from the past 24 hour(s)).  Assessment/Plan: Vester DAELIN CICHY is a 62 y.o. female present for OV for  Change in  voice/thyroid nodule Thyroid biopsy right superior nodule results returned with atypia of undetermined significance-Bethesda 3.  We discussed this finding today. AFIRMA test pending.  - thyroid labs WNL Has [facility] appt this coming Monday. Will fax bx report to them to have on hand. Referral to endocrine placed today to follow, as patient will need further management of thyroid nodule.  Reviewed expectations re: course of current medical issues. Discussed self-management of symptoms. Outlined signs and symptoms indicating need for more acute intervention. Patient verbalized understanding and all questions were answered. Patient received an After-Visit Summary.    Orders Placed This Encounter  Procedures   Ambulatory referral to Endocrinology   No orders of the defined types were placed in this encounter.  Referral Orders         Ambulatory referral to Endocrinology       Note is dictated utilizing voice recognition software. Although note has been proof read prior to signing, occasional typographical errors still can be missed. If any questions arise, please do not hesitate to call for verification.   electronically signed by:  Felix Pacini, DO  [facility] - [place]

## 2023-01-10 DIAGNOSIS — E041 Nontoxic single thyroid nodule: Secondary | ICD-10-CM | POA: Insufficient documentation

## 2023-01-15 ENCOUNTER — Encounter: Payer: Self-pay | Admitting: Hematology

## 2023-01-18 ENCOUNTER — Other Ambulatory Visit (INDEPENDENT_AMBULATORY_CARE_PROVIDER_SITE_OTHER): Payer: Commercial Managed Care - PPO

## 2023-01-18 ENCOUNTER — Other Ambulatory Visit: Payer: Self-pay

## 2023-01-18 DIAGNOSIS — E041 Nontoxic single thyroid nodule: Secondary | ICD-10-CM

## 2023-01-18 LAB — T4, FREE: Free T4: 1.04 ng/dL (ref 0.60–1.60)

## 2023-01-18 LAB — TSH: TSH: 2.22 u[IU]/mL (ref 0.35–5.50)

## 2023-01-19 ENCOUNTER — Encounter (HOSPITAL_COMMUNITY): Payer: Self-pay

## 2023-01-22 ENCOUNTER — Telehealth: Payer: Self-pay | Admitting: Family Medicine

## 2023-01-22 NOTE — Telephone Encounter (Signed)
Patient called back at the same time message was being documented. She is scheduled with Dr. Roosevelt Locks  with [facility]. Appointment is 9/26. She is available to give a call back if you would like to speak with Lacey.

## 2023-01-22 NOTE — Telephone Encounter (Signed)
Attempted to call pt to discuss her AFIRMA thyroid results are considered suspicious.and to make sure she has est. With endocrine.  She was referred 01/10/2023 and I do not see an appt schedule as of yet, but referral is closed.

## 2023-01-23 NOTE — Telephone Encounter (Signed)
noted 

## 2023-01-25 ENCOUNTER — Ambulatory Visit (INDEPENDENT_AMBULATORY_CARE_PROVIDER_SITE_OTHER): Payer: Commercial Managed Care - PPO | Admitting: "Endocrinology

## 2023-01-25 ENCOUNTER — Encounter: Payer: Self-pay | Admitting: "Endocrinology

## 2023-01-25 VITALS — BP 140/83 | HR 75 | Ht 67.0 in | Wt 157.0 lb

## 2023-01-25 DIAGNOSIS — E042 Nontoxic multinodular goiter: Secondary | ICD-10-CM

## 2023-01-25 NOTE — Progress Notes (Signed)
Outpatient Endocrinology Note [name] [place], MD  01/25/23   [name] [name] June 07, 1960 [id number]  Referring Provider: Natalia Leatherwood, DO Primary Care Provider: Natalia Leatherwood, DO Subjective  No chief complaint on file.   Assessment & Plan  Diagnoses and all orders for this visit:  Multinodular goiter    [name] [name] is currently not on any thyroid medication. 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 Has history of stage 3 breast cancer in 2015 s/p surgery, chemo and radiation 12/05/2022 thyroid ultrasound revealed multinodular right thyroid with 1.6 x 1.5 x 1.3 cm superior right TR 4 thyroid nodule status post FNA on 12/28/2022 showing atypia of undetermined significance, Bethesda category III status post Afirma showing 75% suspicion for malignancy Discussed with patient at length the pros and cons of thyroidectomy, need for lifelong thyroid hormone replacement Patient needs time to make a decision and will let us know whether she would like to please proceed with thyroidectomy or follow-up with a thyroid ultrasound in 3 to 6 months  Discussion understood at length by both patient and her husband  I have reviewed current medications, nurse's notes, allergies, vital signs, past medical and surgical history, family medical history, and social history for this encounter. Counseled patient on symptoms, examination findings, lab findings, imaging results, treatment decisions and monitoring and prognosis. The patient understood the recommendations and agrees with the treatment plan. All questions regarding treatment plan were fully answered.   Return in about 5 months (around 06/27/2023).   [name] [place], MD  01/25/23   I  have reviewed current medications, nurse's notes, allergies, vital signs, past medical and surgical history, family medical history, and social history for this encounter. Counseled patient on symptoms, examination findings, lab findings, imaging results, treatment decisions and monitoring and prognosis. The patient understood the recommendations and agrees with the treatment plan. All questions regarding treatment plan were fully answered.   History of Present Illness [name] [name] is a 62 y.o. year old female who presents to our clinic with multinodular goiter.   Symptoms suggestive of HYPOTHYROIDISM:  + intermittent hoarseness and worsening dry cough for a year, with other complaints Saw an ENT, on acid reflux medication No dysphagia  Has history of stage 3 breast cancer in 2015 s/p surgery, chemo and radiation  Had left thyroidectomy in 2003 at [facility] kidney for a recurrent thyroid cyst  No other complaints  12/05/22 CLINICAL DATA:  62 year old female with enlarged thyroid with voice changes   EXAM: THYROID ULTRASOUND   TECHNIQUE: Ultrasound examination of the thyroid gland and adjacent soft tissues was performed.   COMPARISON:  08/11/2015   FINDINGS: Parenchymal Echotexture: Moderately heterogenous   Isthmus: 0.3 cm   Right lobe: 6.7 cm x 2.4 cm x 2.1 cm   Left lobe: Prior left hemithyroidectomy   _________________________________________________________   Estimated total number of nodules >/= 1 cm: 1   Number of spongiform nodules >/=  2 cm not described below (TR1): 0   Number of mixed cystic and solid nodules >/= 1.5 cm not described below (TR2): 0   _________________________________________________________   Nodule labeled 1 superior right thyroid, new from the comparison, 1.6 cm x 1.5 cm x 1.3 cm. Nodule has TR 4 characteristics and meets criteria  for biopsy.   The nodules labeled 2, 3, 4 are all less than 10 mm, none with suspicious/high  risk features. None meet criteria for surveillance.   No adenopathy   IMPRESSION: New right superior thyroid nodule (labeled 1, 1.6 cm, TR 4) meets criteria for biopsy, as designated by the newly established ACR TI-RADS criteria, and referral for biopsy is recommended.   Left hemithyroidectomy.  12/28/22 nical History: Nodule labeled 1 superior right thyroid, new from the  comparison, 1.6cm x 1.5cm x 1.3cm. Nodule has TR4 characteristics and  meets criteria for biopsy  Specimen Submitted:  A. THYROID, RT SUPERIOR NODULE#1, FINE NEEDLE  ASPIRATION    FINAL MICROSCOPIC DIAGNOSIS:  - Atypia of undetermined significance (Bethesda category III)       Physical Exam  BP (!) 140/83   Pulse 75   Ht 5\' 7"  (1.702 m)   Wt 157 lb (71.2 kg)   SpO2 98%   BMI 24.59 kg/m  Constitutional: well developed, well nourished Head: normocephalic, atraumatic, no exophthalmos Eyes: sclera anicteric, no redness Neck: + R thyromegaly, no thyroid tenderness Lungs: normal respiratory effort Neurology: alert and oriented Skin: dry, no appreciable rashes Musculoskeletal: no appreciable defects Psychiatric: normal mood and affect  Allergies No Known Allergies  Current Medications Patient's Medications  New Prescriptions   No medications on file  Previous Medications   ASCORBIC ACID (VITAMIN C) 1000 MG TABLET    Take 1,000 mg by mouth daily.   FAMOTIDINE (PEPCID) 20 MG TABLET    Take 20 mg by mouth at bedtime.   KRILL OIL 1000 MG CAPS    Take 1,000 mg by mouth daily.   OMEPRAZOLE (PRILOSEC) 20 MG CAPSULE    Take 1 capsule (20 mg total) by mouth daily.   VITAMINS A & D (VITAMIN A & D) 10000-400 UNITS TABS      Modified Medications   No medications on file  Discontinued Medications   No medications on file    Past Medical History Past Medical History:  Diagnosis Date   Anxiety disorder    Mild: treated by her gynecologist with selective serotonin reuptake inhibitor   Arthritis    hands  and feet   Breast cancer (HCC) 09/09/2013   Family history of malignant neoplasm of breast    Fibroid uterus    hysterctomy   GERD (gastroesophageal reflux disease)    Hot flashes    Hyperlipidemia    Malignant neoplasm of breast (female), unspecified site 08/07/2013   left breast invasive mammary ca in situ   Nonspecific abnormal electrocardiogram (ECG) (EKG) 2009   Osteoarthritis    Palpitations 2009   Improved; Secondary to premature ventricular contractions. No problems since-pt states was caring for ailing father, anxiety   Personal history of radiation therapy    left breast 2015   Psychophysiological insomnia 04/15/2020   Skin lesion 08/10/2015   Right forearm and left shoulder   dermatology note 08/2015: Dr. Lenis Dickinson, inflamed seborrheic keratosis left posterior shoulder, no treatment. Actinic keratosis on right arm with cryotherapy. Benign scattered seborrheic keratosis.   Thyroid disease    Tick bite of right thigh 2016   Use of tamoxifen (Nolvadex) 08/10/2015   Started: 03/2014    Wears glasses     Past Surgical History Past Surgical History:  Procedure Laterality Date   ABDOMINAL HYSTERECTOMY  02/07/2011   Procedure: HYSTERECTOMY ABDOMINAL;  Surgeon: Zelphia Cairo;  Location: [facility];  Service: Gynecology;  Laterality: N/A;   BREAST EXCISIONAL BIOPSY Right 10/07/2014  high risk   BREAST EXCISIONAL BIOPSY Left 12/24/2015   benign   BREAST LUMPECTOMY Left 09/09/2013   left breast 2015   BREAST LUMPECTOMY WITH NEEDLE LOCALIZATION AND AXILLARY LYMPH NODE DISSECTION Left 09/09/2013   Procedure: LEFT BREAST LUMPECTOMY WITH NEEDLE LOCALIZATION AND LEFT AXILLARY LYMPH NODE DISSECTION;  Surgeon: Mariella Saa, MD;  Location: [facility];  Service: General;  Laterality: Left;   BREAST LUMPECTOMY WITH RADIOACTIVE SEED LOCALIZATION Right 10/07/2014   Procedure: RIGHT BREAST LUMPECTOMY WITH RADIOACTIVE SEED LOCALIZATION;  Surgeon: Glenna Fellows, MD;   Location: [facility];  Service: General;  Laterality: Right;   BREAST LUMPECTOMY WITH RADIOACTIVE SEED LOCALIZATION Left 12/24/2015   Procedure: LEFT BREAST LUMPECTOMY WITH RADIOACTIVE SEED LOCALIZATION;  Surgeon: Glenna Fellows, MD;  Location: [facility];  Service: General;  Laterality: Left;   CESAREAN SECTION     DILATION AND CURETTAGE OF UTERUS     ENDOMETRIAL ABLATION  2006   LAPAROSCOPIC ASSISTED VAGINAL HYSTERECTOMY  02/07/2011   Procedure: LAPAROSCOPIC ASSISTED VAGINAL HYSTERECTOMY;  Surgeon: Zelphia Cairo;  Location: [facility];  Service: Gynecology;  Laterality: N/A;   PORTACATH PLACEMENT Right 09/29/2013   Procedure: INSERTION PORT-A-CATH;  Surgeon: Mariella Saa, MD;  Location: [facility];  Service: General;  Laterality: Right;   THYROIDECTOMY, PARTIAL      Family History family history includes Angina in her father; Aortic aneurysm in her father; Arrhythmia in her maternal aunt, maternal aunt and another family member; Breast cancer in her maternal aunt, maternal aunt, and mother; Breast cancer (age of onset: 72) in her maternal aunt; CAD in her father; Cancer in her paternal uncle; Colon cancer in her maternal grandmother; Dementia in her father; Hypertension in an other family member; Non-Hodgkin's lymphoma in her mother; Rectal cancer in her paternal grandmother; Thyroid disease in her maternal aunt, maternal grandmother, and maternal uncle.  Social History Social History   Socioeconomic History   Marital status: Married    Spouse name: Not on file   Number of children: Not on file   Years of education: Not on file   Highest education level: Some college, no degree  Occupational History   Occupation: Register of deeds    Comment: Full time  Tobacco Use   Smoking status: Never    Passive exposure: Never   Smokeless tobacco: Never  Vaping Use   Vaping status: Never Used  Substance and Sexual Activity   Alcohol use: Yes     Alcohol/week: 2.0 standard drinks of alcohol    Types: 2 Glasses of wine per week   Drug use: Not Currently    Comment: smoked marijuana last month   Sexual activity: Yes    Birth control/protection: Surgical  Other Topics Concern   Not on file  Social History Narrative   Married, Onalee Hua. 2 children Arlys John and [place])   HS graduation; [employer] employee (desk job)   Nonsmoker, 3-5 drinks weekly of etoh,  Denies drugs.    Drinks caffeine beverages   Wears seatbelt, smoke detector in the home   Firearms in the home   Exercises >3 x week.    Feels safe in relationships.    Social Determinants of Health   Financial Resource Strain: Low Risk  (12/01/2022)   Overall Financial Resource Strain (CARDIA)    Difficulty of Paying Living Expenses: Not hard at all  Food Insecurity: No Food Insecurity (12/01/2022)   Hunger Vital Sign    Worried About Running Out  of Food in the Last Year: Never true    Ran Out of Food in the Last Year: Never true  Transportation Needs: No Transportation Needs (12/01/2022)   PRAPARE - Administrator, Civil Service (Medical): No    Lack of Transportation (Non-Medical): No  Physical Activity: Insufficiently Active (12/01/2022)   Exercise Vital Sign    Days of Exercise per Week: 3 days    Minutes of Exercise per Session: 30 min  Stress: No Stress Concern Present (12/01/2022)   [employer] of Occupational Health - Occupational Stress Questionnaire    Feeling of Stress : Not at all  Social Connections: Moderately Integrated (12/01/2022)   Social Connection and Isolation Panel [NHANES]    Frequency of Communication with Friends and Family: More than three times a week    Frequency of Social Gatherings with Friends and Family: Once a week    Attends Religious Services: Never    Database administrator or Organizations: Yes    Attends Banker Meetings: More than 4 times per year    Marital Status: Married  Intimate Partner Violence: Not on  file    Laboratory Investigations Lab Results  Component Value Date   TSH 2.22 01/18/2023   TSH 1.65 12/01/2022   TSH 1.92 08/19/2021   FREET4 1.04 01/18/2023   FREET4 0.96 12/01/2022   FREET4 0.85 01/30/2019     No results found for: "TSI"   No components found for: "TRAB"   Lab Results  Component Value Date   CHOL 214 (H) 08/19/2021   Lab Results  Component Value Date   HDL 43.40 08/19/2021   Lab Results  Component Value Date   LDLCALC 142 (H) 08/19/2021   Lab Results  Component Value Date   TRIG 143.0 08/19/2021   Lab Results  Component Value Date   CHOLHDL 5 08/19/2021   Lab Results  Component Value Date   CREATININE 0.79 08/19/2021   Lab Results  Component Value Date   GFR 80.09 08/19/2021      Component Value Date/Time   NA 138 08/19/2021 1001   NA 139 01/12/2020 0000   NA 142 10/26/2016 1141   K 4.4 08/19/2021 1001   K 3.8 10/26/2016 1141   CL 105 08/19/2021 1001   CO2 23 08/19/2021 1001   CO2 24 10/26/2016 1141   GLUCOSE 90 08/19/2021 1001   GLUCOSE 122 10/26/2016 1141   BUN 14 08/19/2021 1001   BUN 15 01/12/2020 0000   BUN 13.2 10/26/2016 1141   CREATININE 0.79 08/19/2021 1001   CREATININE 0.85 02/19/2019 1148   CREATININE 0.9 10/26/2016 1141   CALCIUM 8.6 08/19/2021 1001   CALCIUM 9.1 10/26/2016 1141   PROT 6.8 08/19/2021 1001   PROT 6.8 10/26/2016 1141   ALBUMIN 4.3 08/19/2021 1001   ALBUMIN 3.7 10/26/2016 1141   AST 18 08/19/2021 1001   AST 22 02/19/2019 1148   AST 20 10/26/2016 1141   ALT 22 08/19/2021 1001   ALT 26 02/19/2019 1148   ALT 26 10/26/2016 1141   ALKPHOS 76 08/19/2021 1001   ALKPHOS 72 10/26/2016 1141   BILITOT 0.5 08/19/2021 1001   BILITOT 0.4 02/19/2019 1148   BILITOT 0.28 10/26/2016 1141   GFRNONAA 64 01/12/2020 0000   GFRNONAA >60 02/19/2019 1148   GFRAA 74 01/12/2020 0000   GFRAA >60 02/19/2019 1148      Latest Ref Rng & Units 08/19/2021   10:01 AM 03/09/2020   11:38 AM 01/12/2020  12:00 AM  BMP   Glucose 70 - 99 mg/dL 90  91    BUN 6 - 23 mg/dL 14  16  15       Creatinine 0.40 - 1.20 mg/dL 2.13  0.86  1.0      Sodium 135 - 145 mEq/L 138  137  139      Potassium 3.5 - 5.1 mEq/L 4.4  4.4  4.6      Chloride 96 - 112 mEq/L 105  102  105      CO2 19 - 32 mEq/L 23  27  21       Calcium 8.4 - 10.5 mg/dL 8.6  9.2  9.1         This result is from an external source.       Component Value Date/Time   WBC 4.8 08/19/2021 1001   RBC 4.23 08/19/2021 1001   HGB 13.5 08/19/2021 1001   HGB 11.6 (L) 02/19/2019 1148   HGB 13.1 10/26/2016 1141   HCT 40.1 08/19/2021 1001   HCT 38.2 10/26/2016 1141   PLT 261.0 08/19/2021 1001   PLT 211 02/19/2019 1148   PLT 223 10/26/2016 1141   MCV 94.8 08/19/2021 1001   MCV 93.2 10/26/2016 1141   MCH 30.9 02/19/2019 1148   MCHC 33.7 08/19/2021 1001   RDW 12.9 08/19/2021 1001   RDW 12.6 10/26/2016 1141   LYMPHSABS 1.5 03/09/2020 1138   LYMPHSABS 1.4 10/26/2016 1141   MONOABS 0.5 03/09/2020 1138   MONOABS 0.4 10/26/2016 1141   EOSABS 0.1 03/09/2020 1138   EOSABS 0.2 10/26/2016 1141   BASOSABS 0.0 03/09/2020 1138   BASOSABS 0.0 10/26/2016 1141      Parts of this note may have been dictated using voice recognition software. There may be variances in spelling and vocabulary which are unintentional. Not all errors are proofread. Please notify the Thereasa Parkin if any discrepancies are noted or if the meaning of any statement is not clear.

## 2023-01-26 ENCOUNTER — Ambulatory Visit (INDEPENDENT_AMBULATORY_CARE_PROVIDER_SITE_OTHER): Payer: Commercial Managed Care - PPO | Admitting: Family Medicine

## 2023-01-26 ENCOUNTER — Encounter: Payer: Self-pay | Admitting: Family Medicine

## 2023-01-26 VITALS — BP 124/78 | HR 69 | Temp 97.6°F | Ht 67.25 in | Wt 157.8 lb

## 2023-01-26 DIAGNOSIS — E559 Vitamin D deficiency, unspecified: Secondary | ICD-10-CM

## 2023-01-26 DIAGNOSIS — Z131 Encounter for screening for diabetes mellitus: Secondary | ICD-10-CM | POA: Diagnosis not present

## 2023-01-26 DIAGNOSIS — Z0000 Encounter for general adult medical examination without abnormal findings: Secondary | ICD-10-CM | POA: Diagnosis not present

## 2023-01-26 DIAGNOSIS — Z13220 Encounter for screening for lipoid disorders: Secondary | ICD-10-CM

## 2023-01-26 DIAGNOSIS — Z8249 Family history of ischemic heart disease and other diseases of the circulatory system: Secondary | ICD-10-CM

## 2023-01-26 DIAGNOSIS — R899 Unspecified abnormal finding in specimens from other organs, systems and tissues: Secondary | ICD-10-CM | POA: Diagnosis not present

## 2023-01-26 LAB — CBC WITH DIFFERENTIAL/PLATELET
Basophils Absolute: 0 10*3/uL (ref 0.0–0.1)
Basophils Relative: 0.9 % (ref 0.0–3.0)
Eosinophils Absolute: 0.4 10*3/uL (ref 0.0–0.7)
Eosinophils Relative: 6.9 % — ABNORMAL HIGH (ref 0.0–5.0)
HCT: 40.5 % (ref 36.0–46.0)
Hemoglobin: 13.1 g/dL (ref 12.0–15.0)
Lymphocytes Relative: 34 % (ref 12.0–46.0)
Lymphs Abs: 1.8 10*3/uL (ref 0.7–4.0)
MCHC: 32.3 g/dL (ref 30.0–36.0)
MCV: 94.4 fL (ref 78.0–100.0)
Monocytes Absolute: 0.4 10*3/uL (ref 0.1–1.0)
Monocytes Relative: 7.4 % (ref 3.0–12.0)
Neutro Abs: 2.7 10*3/uL (ref 1.4–7.7)
Neutrophils Relative %: 50.8 % (ref 43.0–77.0)
Platelets: 297 10*3/uL (ref 150.0–400.0)
RBC: 4.28 Mil/uL (ref 3.87–5.11)
RDW: 13.2 % (ref 11.5–15.5)
WBC: 5.2 10*3/uL (ref 4.0–10.5)

## 2023-01-26 LAB — LIPID PANEL
Cholesterol: 218 mg/dL — ABNORMAL HIGH (ref 0–200)
HDL: 44.5 mg/dL (ref >39.00)
LDL Cholesterol: 132 mg/dL — ABNORMAL HIGH (ref 0–99)
NonHDL: 173.73
Total CHOL/HDL Ratio: 5
Triglycerides: 209 mg/dL — ABNORMAL HIGH (ref 0.0–149.0)
VLDL: 41.8 mg/dL — ABNORMAL HIGH (ref 0.0–40.0)

## 2023-01-26 LAB — COMPREHENSIVE METABOLIC PANEL
ALT: 19 U/L (ref 0–35)
AST: 15 U/L (ref 0–37)
Albumin: 4.3 g/dL (ref 3.5–5.2)
Alkaline Phosphatase: 86 U/L (ref 39–117)
BUN: 16 mg/dL (ref 6–23)
CO2: 29 meq/L (ref 19–32)
Calcium: 9.3 mg/dL (ref 8.4–10.5)
Chloride: 104 meq/L (ref 96–112)
Creatinine, Ser: 0.8 mg/dL (ref 0.40–1.20)
GFR: 78.1 mL/min (ref >60.00)
Glucose, Bld: 99 mg/dL (ref 70–99)
Potassium: 4.7 meq/L (ref 3.5–5.1)
Sodium: 140 meq/L (ref 135–145)
Total Bilirubin: 0.4 mg/dL (ref 0.2–1.2)
Total Protein: 6.9 g/dL (ref 6.0–8.3)

## 2023-01-26 LAB — VITAMIN D 25 HYDROXY (VIT D DEFICIENCY, FRACTURES): VITD: 28.47 ng/mL — ABNORMAL LOW (ref 30.00–100.00)

## 2023-01-26 LAB — HEMOGLOBIN A1C: Hgb A1c MFr Bld: 5.5 % (ref 4.6–6.5)

## 2023-01-26 NOTE — Progress Notes (Signed)
Patient ID: [name] [name], female  DOB: [birth year], 62 y.o.   MRN: [id number] Patient Care Team    Relationship Specialty Notifications Start End  Natalia Leatherwood, DO PCP - General Family Medicine  07/16/15   Magrinat, Valentino Hue, MD (Inactive) Consulting Physician Oncology  08/10/15   Zelphia Cairo, MD Consulting Physician Obstetrics and Gynecology  08/10/15   Armbruster, Willaim Rayas, MD Consulting Physician Gastroenterology  10/30/18     Chief Complaint  Patient presents with   Annual Exam    Pt is fasting    Subjective: [name] [name] is a 62 y.o.  Female  present for CPE  All past medical history, surgical history, allergies, family history, immunizations, medications and social history were updated in the electronic medical record today. All recent labs, ED visits and hospitalizations within the last year were reviewed.  Health maintenance:  Colonoscopy: completed 2019. Dr. Adela Lank. 10 yr. Benign polyp x1 Mammogram: 11/2022; yearly- [facility] (H/o Left Breast cancer 2015)>ordered by gyn Cervical cancer screening:Dr. Renaldo Fiddler, [facility], hysterectomy for fibroids. Immunizations: Declines Flu,  Tdap completed 2017. covid series completed. Shingles completed Infectious disease screening: HIV and Hep C completed  DEXA: completed at [facility] 03/2020.  Osteopenia. Taking Vit D and Ca use. Estrogen deficiency Assistive device: none Oxygen OZH:YQMV Patient has a Dental home. 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 No problems since-pt states was caring for ailing father, anxiety   Personal history of radiation therapy    left breast 2015   Psychophysiological insomnia 04/15/2020   Skin lesion 08/10/2015   Right forearm and left shoulder  [place] dermatology note 08/2015: Dr. Lenis Dickinson, inflamed seborrheic keratosis left posterior shoulder, no treatment. Actinic keratosis on right arm with cryotherapy. Benign scattered seborrheic keratosis.   Thyroid disease    Tick bite of right thigh 2016   Use of tamoxifen (Nolvadex) 08/10/2015   Started: 03/2014    Wears glasses    No Known Allergies Past Surgical History:  Procedure Laterality Date   ABDOMINAL HYSTERECTOMY  02/07/2011   Procedure: HYSTERECTOMY ABDOMINAL;  Surgeon: Zelphia Cairo;  Location: [facility];  Service: Gynecology;  Laterality:  N/A;   BREAST EXCISIONAL BIOPSY Right 10/07/2014   high risk   BREAST EXCISIONAL BIOPSY Left 12/24/2015   benign   BREAST LUMPECTOMY Left 09/09/2013   left breast 2015   BREAST LUMPECTOMY WITH NEEDLE LOCALIZATION AND AXILLARY LYMPH NODE DISSECTION Left 09/09/2013   Procedure: LEFT BREAST LUMPECTOMY WITH NEEDLE LOCALIZATION AND LEFT AXILLARY LYMPH NODE DISSECTION;  Surgeon: Mariella Saa, MD;  Location: [facility];  Service: General;  Laterality: Left;   BREAST LUMPECTOMY WITH RADIOACTIVE SEED LOCALIZATION Right 10/07/2014   Procedure: RIGHT BREAST LUMPECTOMY WITH RADIOACTIVE SEED LOCALIZATION;  Surgeon: Glenna Fellows, MD;  Location: [facility];  Service: General;  Laterality: Right;   BREAST LUMPECTOMY WITH RADIOACTIVE SEED LOCALIZATION Left 12/24/2015   Procedure: LEFT BREAST LUMPECTOMY WITH RADIOACTIVE SEED LOCALIZATION;  Surgeon: Glenna Fellows, MD;  Location: [facility];  Service: General;  Laterality: Left;   CESAREAN SECTION     DILATION AND CURETTAGE OF UTERUS     ENDOMETRIAL ABLATION  2006   LAPAROSCOPIC ASSISTED VAGINAL HYSTERECTOMY  02/07/2011   Procedure: LAPAROSCOPIC ASSISTED VAGINAL HYSTERECTOMY;  Surgeon: Zelphia Cairo;  Location: [facility];  Service: Gynecology;  Laterality: N/A;   PORTACATH PLACEMENT Right 09/29/2013   Procedure: INSERTION PORT-A-CATH;  Surgeon: Mariella Saa, MD;  Location: [facility];  Service: General;  Laterality: Right;   THYROIDECTOMY, PARTIAL     Family History  Problem Relation Age of Onset   Angina Father    Aortic aneurysm Father    Dementia Father    CAD Father    Hypertension Other    Arrhythmia Other        Uncle- a fib   Breast cancer Mother        dx 31s. Died at 83   Non-Hodgkin's lymphoma Mother    Arrhythmia Maternal Aunt        A fib   Breast cancer Maternal Aunt 69       currently 98   Thyroid disease Maternal Aunt    Arrhythmia Maternal Aunt        A fib   Breast cancer Maternal Aunt        dx 23s; currently 59s   Colon cancer Maternal Grandmother        dx 20s; died at 17   Thyroid disease Maternal Grandmother    Cancer Paternal Uncle        unknown primary in early 57s   Breast cancer Maternal Aunt        dx 86s; died in 26s   Thyroid disease Maternal Uncle    Rectal cancer Paternal Grandmother    Stomach cancer Neg Hx    Esophageal cancer Neg Hx    Social History    Social History Narrative   Married, [name] [name]. 2 children [name] [name] and [name] [name])   HS graduation; [employer] employee (desk job)   Nonsmoker, 3-5 drinks weekly of etoh,  Denies drugs.    Drinks caffeine beverages   Wears seatbelt, smoke detector in the home   Firearms in the home   Exercises >3 x week.    Feels safe in relationships.     Allergies as of 01/26/2023   No Known Allergies      Medication List        Accurate as of January 26, 2023  9:50 AM. If you have any questions, ask your nurse or doctor.          famotidine 20 MG tablet  Commonly known as: PEPCID Take 20 mg by mouth at bedtime.   Krill Oil 1000 MG Caps Take 1,000 mg by mouth daily.   omeprazole 20 MG capsule Commonly known as: PRILOSEC Take 1 capsule (20 mg total) by mouth daily.   Vitamin A & D 10000-400 units Tabs   vitamin C 1000 MG tablet Take 1,000 mg by mouth daily.        All past medical history, surgical history, allergies, family history, immunizations andmedications were updated in the EMR today and reviewed under the history and medication portions of their EMR.       ROS 14 pt review of systems performed and negative (unless mentioned in an HPI)  Objective: BP 124/78   Pulse 69   Temp 97.6 F (36.4 C)   Ht 5' 7.25" (1.708 m)   Wt 157 lb 12.8 oz (71.6 kg)   SpO2 99%   BMI 24.53 kg/m  Physical Exam Vitals and nursing note reviewed.  Constitutional:      General: She is not in acute distress.    Appearance: Normal appearance. She is not ill-appearing or toxic-appearing.  HENT:     Head: Normocephalic and atraumatic.     Right Ear: Tympanic membrane, ear canal and external ear normal. There is no impacted cerumen.     Left Ear: Tympanic membrane, ear canal and external ear normal. There is no impacted cerumen.     Nose: No congestion or rhinorrhea.     Mouth/Throat:     Mouth: Mucous membranes are moist.     Pharynx: Oropharynx is clear. No oropharyngeal exudate or  posterior oropharyngeal erythema.  Eyes:     General: No scleral icterus.       Right eye: No discharge.        Left eye: No discharge.     Extraocular Movements: Extraocular movements intact.     Conjunctiva/sclera: Conjunctivae normal.     Pupils: Pupils are equal, round, and reactive to light.  Cardiovascular:     Rate and Rhythm: Normal rate and regular rhythm.     Pulses: Normal pulses.     Heart sounds: Normal heart sounds. No murmur heard.    No friction rub. No gallop.  Pulmonary:     Effort: Pulmonary effort is normal. No respiratory distress.     Breath sounds: Normal breath sounds. No stridor. No wheezing, rhonchi or rales.  Chest:     Chest wall: No tenderness.  Abdominal:     General: Abdomen is flat. Bowel sounds are normal. There is no distension.     Palpations: Abdomen is soft. There is no mass.     Tenderness: There is no abdominal tenderness. There is no right CVA tenderness, left CVA tenderness, guarding or rebound.     Hernia: No hernia is present.  Musculoskeletal:        General: No swelling, tenderness or deformity. Normal range of motion.     Cervical back: Normal range of motion and neck supple. No rigidity or tenderness.     Right lower leg: No edema.     Left lower leg: No edema.  Lymphadenopathy:     Cervical: No cervical adenopathy.  Skin:    General: Skin is warm and dry.     Coloration: Skin is not jaundiced or pale.     Findings: No bruising, erythema, lesion or rash.  Neurological:     General: No focal deficit present.     Mental Status: She is alert  and oriented to person, place, and time. Mental status is at baseline.     Cranial Nerves: No cranial nerve deficit.     Sensory: No sensory deficit.     Motor: No weakness.     Coordination: Coordination normal.     Gait: Gait normal.     Deep Tendon Reflexes: Reflexes normal.  Psychiatric:        Mood and Affect: Mood normal.        Behavior: Behavior normal.        Thought Content:  Thought content normal.        Judgment: Judgment normal.      No results found.  Assessment/plan: Kattaleya ALLEN COHENOUR is a 62 y.o. female present for CPE  Osteopenia/vit d def - Vitamin D (25 hydroxy) - continue supplement  Routine general medical examination at a health care facility Patient was encouraged to exercise greater than 150 minutes a week. Patient was encouraged to choose a diet filled with fresh fruits and vegetables, and lean meats. AVS provided to patient today for education/recommendation on gender specific health and safety maintenance. Colonoscopy: completed 2019. Dr. Adela Lank. 10 yr. Benign polyp x1 Mammogram: 11/2022; yearly- [facility] (H/o Left Breast cancer 2015)>ordered by gyn Cervical cancer screening:Dr. Renaldo Fiddler, [facility], hysterectomy for fibroids. Immunizations: Declines Flu,  Tdap completed 2017. covid series completed. Shingles completed Infectious disease screening: HIV and Hep C completed  DEXA: completed at [facility] 03/2020.  Osteopenia. Taking Vit D and Ca use. Estrogen deficiency  Return in about 1 year (around 01/27/2024) for cpe (20 min).  Orders Placed This Encounter  Procedures   CBC w/Diff   Comp Met (CMET)   Lipid panel   Hemoglobin A1c   Vitamin D (25 hydroxy)    No orders of the defined types were placed in this encounter.  Referral Orders  No referral(s) requested today     Electronically signed by: Felix Pacini, DO [facility]

## 2023-01-26 NOTE — Patient Instructions (Addendum)
Return in about 1 year (around 01/27/2024) for cpe (20 min).        Great to see you today.  I have refilled the medication(s) we provide.   If labs were collected or images ordered, we will inform you of  results once we have received them and reviewed. We will contact you either by echart message, or telephone call.  Please give ample time to the testing facility, and our office to run,  receive and review results. Please do not call inquiring of results, even if you can see them in your chart. We will contact you as soon as we are able. If it has been over 1 week since the test was completed, and you have not yet heard from [place], then please call us.    - echart message- for normal results that have been seen by the patient already.   - telephone call: abnormal results or if patient has not viewed results in their echart.  If a referral to a specialist was entered for you, please call us in 2 weeks if you have not heard from the specialist office to schedule.

## 2023-02-02 ENCOUNTER — Encounter: Payer: Self-pay | Admitting: "Endocrinology

## 2023-02-23 ENCOUNTER — Other Ambulatory Visit: Payer: Commercial Managed Care - PPO

## 2023-03-05 ENCOUNTER — Ambulatory Visit: Payer: Commercial Managed Care - PPO | Admitting: "Endocrinology

## 2023-03-21 ENCOUNTER — Ambulatory Visit: Payer: Self-pay | Admitting: Surgery

## 2023-04-20 ENCOUNTER — Encounter: Payer: Self-pay | Admitting: Family Medicine

## 2023-04-20 ENCOUNTER — Encounter: Payer: Self-pay | Admitting: "Endocrinology

## 2023-04-20 NOTE — Telephone Encounter (Signed)
Patients which undergo a thyroidectomy will need to follow with endocrine initially for close monitoring. Once thyroid levels are stable, we can take over management with her.

## 2023-05-03 NOTE — Patient Instructions (Signed)
 DUE TO COVID-19 ONLY TWO VISITORS  (aged 65 and older)  ARE ALLOWED TO COME WITH YOU AND STAY IN THE WAITING ROOM ONLY DURING PRE OP AND PROCEDURE.   **NO VISITORS ARE ALLOWED IN THE SHORT STAY AREA OR RECOVERY ROOM!!**  IF YOU WILL BE ADMITTED INTO THE HOSPITAL YOU ARE ALLOWED ONLY FOUR SUPPORT PEOPLE DURING VISITATION HOURS ONLY (7 AM -8PM)   The support person(s) must pass our screening, gel in and out, and wear a mask at all times, including in the patient's room. Patients must also wear a mask when staff or their support person are in the room. Visitors GUEST BADGE MUST BE WORN VISIBLY  One adult visitor may remain with you overnight and MUST be in the room by 8 P.M.     Your procedure is scheduled on: 05/10/23   Report to [facility] Main Entrance    Report to admitting at : 6:15 AM   Call this number if you have problems the morning of surgery [phone]   Do not eat food or drink fluids: After Midnight.   FOLLOW ANY ADDITIONAL PRE OP INSTRUCTIONS YOU RECEIVED FROM YOUR SURGEON'S OFFICE!!!   Oral Hygiene is also important to reduce your risk of infection.                                    Remember - BRUSH YOUR TEETH THE MORNING OF SURGERY WITH YOUR REGULAR TOOTHPASTE  DENTURES WILL BE REMOVED PRIOR TO SURGERY PLEASE DO NOT APPLY Poly grip OR ADHESIVES!!!   Do NOT smoke after Midnight   Take these medicines the morning of surgery with A SIP OF WATER: NONE> Famotidine ,omeprazole  as needed.                              You may not have any metal on your body including hair pins, jewelry, and body piercing             Do not wear make-up, lotions, powders, perfumes/cologne, or deodorant  Do not wear nail polish including gel and S&S, artificial/acrylic nails, or any other type of covering on natural nails including finger and toenails. If you have artificial nails, gel coating, etc. that needs to be removed by a nail salon please have this removed prior to surgery or  surgery may need to be canceled/ delayed if the surgeon/ anesthesia feels like they are unable to be safely monitored.   Do not shave  48 hours prior to surgery.    Do not bring valuables to the hospital. [facility] IS NOT             RESPONSIBLE   FOR VALUABLES.   Contacts, glasses, or bridgework may not be worn into surgery.   Bring small overnight bag day of surgery.   DO NOT BRING YOUR HOME MEDICATIONS TO THE HOSPITAL. PHARMACY WILL DISPENSE MEDICATIONS LISTED ON YOUR MEDICATION LIST TO YOU DURING YOUR ADMISSION IN THE HOSPITAL!    Patients discharged on the day of surgery will not be allowed to drive home.  Someone NEEDS to stay with you for the first 24 hours after anesthesia.   Special Instructions: Bring a copy of your healthcare power of attorney and living will documents         the day of surgery if you haven't scanned them before.  Please read over the following fact sheets you were given: IF YOU HAVE QUESTIONS ABOUT YOUR PRE-OP INSTRUCTIONS PLEASE CALL [phone]    [facility] - Preparing for Surgery Before surgery, you can play an important role.  Because skin is not sterile, your skin needs to be as free of germs as possible.  You can reduce the number of germs on your skin by washing with CHG (chlorahexidine gluconate) soap before surgery.  CHG is an antiseptic cleaner which kills germs and bonds with the skin to continue killing germs even after washing. Please DO NOT use if you have an allergy to CHG or antibacterial soaps.  If your skin becomes reddened/irritated stop using the CHG and inform your nurse when you arrive at Short Stay. Do not shave (including legs and underarms) for at least 48 hours prior to the first CHG shower.  You may shave your face/neck. Please follow these instructions carefully:  1.  Shower with CHG Soap the night before surgery and the  morning of Surgery.  2.  If you choose to wash your hair, wash your hair first as usual with  your  normal  shampoo.  3.  After you shampoo, rinse your hair and body thoroughly to remove the  shampoo.                           4.  Use CHG as you would any other liquid soap.  You can apply chg directly  to the skin and wash                       Gently with a scrungie or clean washcloth.  5.  Apply the CHG Soap to your body ONLY FROM THE NECK DOWN.   Do not use on face/ open                           Wound or open sores. Avoid contact with eyes, ears mouth and genitals (private parts).                       Wash face,  Genitals (private parts) with your normal soap.             6.  Wash thoroughly, paying special attention to the area where your surgery  will be performed.  7.  Thoroughly rinse your body with warm water from the neck down.  8.  DO NOT shower/wash with your normal soap after using and rinsing off  the CHG Soap.                9.  Pat yourself dry with a clean towel.            10.  Wear clean pajamas.            11.  Place clean sheets on your bed the night of your first shower and do not  sleep with pets. Day of Surgery : Do not apply any lotions/deodorants the morning of surgery.  Please wear clean clothes to the hospital/surgery center.  FAILURE TO FOLLOW THESE INSTRUCTIONS MAY RESULT IN THE CANCELLATION OF YOUR SURGERY PATIENT SIGNATURE_________________________________  NURSE SIGNATURE__________________________________  ________________________________________________________________________

## 2023-05-04 ENCOUNTER — Encounter (HOSPITAL_COMMUNITY)
Admission: RE | Admit: 2023-05-04 | Discharge: 2023-05-04 | Disposition: A | Discharge status: Home / Self Care | Payer: Commercial Managed Care - PPO | Source: Ambulatory Visit | Attending: Surgery | Admitting: Surgery

## 2023-05-04 ENCOUNTER — Other Ambulatory Visit: Payer: Self-pay

## 2023-05-04 ENCOUNTER — Encounter (HOSPITAL_COMMUNITY): Payer: Self-pay

## 2023-05-04 ENCOUNTER — Ambulatory Visit (HOSPITAL_COMMUNITY)
Admission: RE | Admit: 2023-05-04 | Discharge: 2023-05-04 | Disposition: A | Discharge status: Home / Self Care | Payer: Commercial Managed Care - PPO | Source: Ambulatory Visit | Attending: Anesthesiology | Admitting: Anesthesiology

## 2023-05-04 VITALS — BP 124/76 | HR 93 | Temp 98.1°F | Ht 67.25 in | Wt 156.0 lb

## 2023-05-04 DIAGNOSIS — C50212 Malignant neoplasm of upper-inner quadrant of left female breast: Secondary | ICD-10-CM

## 2023-05-04 DIAGNOSIS — Z170 Estrogen receptor positive status [ER+]: Secondary | ICD-10-CM | POA: Diagnosis present

## 2023-05-04 DIAGNOSIS — R030 Elevated blood-pressure reading, without diagnosis of hypertension: Secondary | ICD-10-CM | POA: Diagnosis present

## 2023-05-04 LAB — CBC
HCT: 34.6 % — ABNORMAL LOW (ref 36.0–46.0)
Hemoglobin: 11.5 g/dL — ABNORMAL LOW (ref 12.0–15.0)
MCH: 31.4 pg (ref 26.0–34.0)
MCHC: 33.2 g/dL (ref 30.0–36.0)
MCV: 94.5 fL (ref 80.0–100.0)
Platelets: 303 10*3/uL (ref 150–400)
RBC: 3.66 MIL/uL — ABNORMAL LOW (ref 3.87–5.11)
RDW: 13.5 % (ref 11.5–15.5)
WBC: 6 10*3/uL (ref 4.0–10.5)
nRBC: 0 % (ref 0.0–0.2)

## 2023-05-04 LAB — BASIC METABOLIC PANEL
Anion gap: 9 (ref 5–15)
BUN: 22 mg/dL (ref 8–23)
CO2: 24 mmol/L (ref 22–32)
Calcium: 8.8 mg/dL — ABNORMAL LOW (ref 8.9–10.3)
Chloride: 105 mmol/L (ref 98–111)
Creatinine, Ser: 0.99 mg/dL (ref 0.44–1.00)
GFR, Estimated: >60 mL/min (ref >60)
Glucose, Bld: 107 mg/dL — ABNORMAL HIGH (ref 70–99)
Potassium: 4.2 mmol/L (ref 3.5–5.1)
Sodium: 138 mmol/L (ref 135–145)

## 2023-05-04 NOTE — Progress Notes (Signed)
 For Anesthesia: PCP - Catherine Charlies LABOR, DO  Cardiologist - N/A  Bowel Prep reminder:  Chest x-ray -  EKG - 05/04/23 Stress Test -  ECHO - 09/30/13 Cardiac Cath -  Pacemaker/ICD device last checked: Pacemaker orders received: Device Rep notified:  Spinal Cord Stimulator: N/A  Sleep Study - Yes CPAP - NO  Fasting Blood Sugar - N/A Checks Blood Sugar _____ times a day Date and result of last Hgb A1c-  Last dose of GLP1 agonist- N/A GLP1 instructions:   Last dose of SGLT-2 inhibitors- N/A SGLT-2 instructions:   Blood Thinner Instructions:N/A Aspirin Instructions: Last Dose:  Activity level: Can go up a flight of stairs and activities of daily living without stopping and without chest pain and/or shortness of breath   Able to exercise without chest pain and/or shortness of breath  Anesthesia review: Hx: Palpitations (none since 2009)  Patient denies shortness of breath, fever, cough and chest pain at PAT appointment   Patient verbalized understanding of instructions that were given to them at the PAT appointment. Patient was also instructed that they will need to review over the PAT instructions again at home before surgery.

## 2023-05-09 ENCOUNTER — Encounter (HOSPITAL_COMMUNITY): Payer: Self-pay | Admitting: Surgery

## 2023-05-09 DIAGNOSIS — Z9009 Acquired absence of other part of head and neck: Secondary | ICD-10-CM | POA: Diagnosis present

## 2023-05-09 DIAGNOSIS — D440 Neoplasm of uncertain behavior of thyroid gland: Secondary | ICD-10-CM | POA: Diagnosis present

## 2023-05-09 DIAGNOSIS — E042 Nontoxic multinodular goiter: Secondary | ICD-10-CM | POA: Diagnosis present

## 2023-05-09 NOTE — H&P (Signed)
 REFERRING PHYSICIAN: Dartha Ernst, MD  PROVIDER: Labella Zahradnik OZELL SPINNER, MD   Chief Complaint: New Consultation (Thyroid  neoplasm of uncertain behavior)  History of Present Illness:  Patient is referred by Dr. Komal Motwani for surgical evaluation and management of a newly diagnosed thyroid  neoplasm of uncertain behavior. Patient's primary care physician is Dr. Charlies Bellini. Patient has a history of thyroid  disease dating back approximately 20 years when she underwent a left thyroid  lobectomy at [facility] by Dr. Inocente Bhat for benign disease. Patient has done well. She is not on thyroid  medication. Recent TSH level is normal at 2.22. Patient underwent a follow-up ultrasound in August 2024. This demonstrated multiple nodules in the right thyroid  lobe. Right lobe was mildly enlarged at 6.7 cm. There was a new nodule in the superior pole measuring 1.6 cm which met criteria for biopsy. Patient underwent fine-needle aspiration biopsy on December 28, 2022. This demonstrated cytologic atypia, Bethesda category III. Specimen was subsequently submitted for molecular genetic testing with AFIRMA. 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 Risk of malignancy was approximately 75%. Patient is therefore referred to surgery for completion thyroidectomy for definitive diagnosis and management. Patient has had some mild hoarseness intermittently since her first surgery. She has been evaluated by ENT and they think this is related to reflux. Patient is not on thyroid  medication. There is a family history of Hashimoto's thyroiditis in the patient's daughter. She is accompanied today by her husband.  Review of Systems: A complete review of systems was obtained from the patient. I have reviewed this information and discussed as appropriate with the patient. See HPI as well for other ROS.  Review of Systems  Constitutional: Negative.  HENT: Negative.  Eyes: Negative.   Respiratory: Negative.  Cardiovascular: Negative.  Gastrointestinal: Negative.  Genitourinary: Negative.  Musculoskeletal: Negative.  Skin: Negative.  Neurological: Negative.  Endo/Heme/Allergies: Negative.  Psychiatric/Behavioral: Negative.    Medical History: Past Medical History:  Diagnosis Date  History of cancer   Patient Active Problem List  Diagnosis  History of lobectomy of thyroid   Neoplasm of uncertain behavior of thyroid  gland  Multiple thyroid  nodules   History reviewed. No pertinent surgical history.   No Known Allergies  Current Outpatient Medications on File Prior to Visit  Medication Sig Dispense Refill  famotidine  (PEPCID ) 20 MG tablet Take 20 mg by mouth at bedtime  krill oil 500 mg Cap  krill-om3-dha-epa-om6-lip-astx (KRILL OIL, OMEGA 3 AND 6,) 1000-130(40-80) mg Cap Take 1,000 mg by mouth once daily  magnesium glycinate 200 mg tablet  omeprazole  (PRILOSEC) 20 MG DR capsule Take 1 capsule by mouth once daily  vitamin A palmitate-vitamin D2 10,000-400 unit Tab   No current facility-administered medications on file prior to visit.   Family History  Problem Relation Age of Onset  High blood pressure (Hypertension) Mother  Hyperlipidemia (Elevated cholesterol) Mother  Breast cancer Mother  Hyperlipidemia (Elevated cholesterol) Father  Coronary Artery Disease (Blocked arteries around heart) Father    Social History   Tobacco Use  Smoking Status Not on file  Smokeless Tobacco Not on file    Social History   Socioeconomic History  Marital status: Married   Social Drivers of Health   Financial Resource Strain: Low Risk (12/01/2022)  Received from [facility]  Overall Financial Resource Strain (CARDIA)  Difficulty of Paying Living Expenses: Not hard at all  Food Insecurity: No Food Insecurity (12/01/2022)  Received from [facility]  Hunger Vital Sign  Worried  About Running Out of Food in the Last Year: Never true  Ran Out of Food in the Last  Year: Never true  Transportation Needs: No Transportation Needs (12/01/2022)  Received from [facility] - Transportation  Lack of Transportation (Medical): No  Lack of Transportation (Non-Medical): No  Physical Activity: Insufficiently Active (12/01/2022)  Received from [facility]  Exercise Vital Sign  Days of Exercise per Week: 3 days  Minutes of Exercise per Session: 30 min  Stress: No Stress Concern Present (12/01/2022)  Received from [facility] - Occupational Stress Questionnaire  Feeling of Stress : Not at all  Social Connections: Moderately Integrated (12/01/2022)  Received from [facility]  Social Connection and Isolation Panel [NHANES]  Frequency of Communication with Friends and Family: More than three times a week  Frequency of Social Gatherings with Friends and Family: Once a week  Attends Religious Services: Never  Database Administrator or Organizations: Yes  Attends Engineer, Structural: More than 4 times per year  Marital Status: Married   Objective:    Physical Exam   GENERAL APPEARANCE Comfortable, no acute issues Development: normal Gross deformities: none  SKIN Rash, lesions, ulcers: none Induration, erythema: none Nodules: none palpable  EYES Conjunctiva and lids: normal Pupils: equal  EARS, NOSE, MOUTH, THROAT External ears: no lesion or deformity External nose: no lesion or deformity Hearing: grossly normal  NECK Symmetric: no Trachea: midline Thyroid : There is no palpable nodularity in the left thyroid  bed. There is a well-healed anterior cervical incision. This is asymmetric towards the left. Palpation on the right shows a relatively soft mildly enlarged right thyroid  lobe. There is a subtle nodule in the superior pole measuring approximately 2 cm in size. There is no associated lymphadenopathy.  CHEST/CV Not assessed  ABDOMEN Not assessed  GENITOURINARY/RECTAL Not  assessed  MUSCULOSKELETAL Station and gait: normal Digits and nails: no clubbing or cyanosis Muscle strength: grossly normal all extremities Deformity: none  LYMPHATIC Cervical: none palpable Supraclavicular: none palpable  PSYCHIATRIC Oriented to person, place, and time: yes Mood and affect: normal for situation Judgment and insight: appropriate for situation   Assessment and Plan:   History of lobectomy of thyroid  Neoplasm of uncertain behavior of thyroid  gland Multiple thyroid  nodules  Patient is referred by her endocrinologist for surgical evaluation and management of a newly diagnosed thyroid  neoplasm of uncertain behavior.  Patient provided with a copy of The Thyroid  Book: Medical and Surgical Treatment of Thyroid  Problems, published by [employer], 16 pages. Book reviewed and explained to patient during visit today.  Today we reviewed her clinical history. We reviewed her recent ultrasound study. We reviewed her cytopathology report and the report from her molecular genetic study. Based on these findings, there is a thyroid  neoplasm of uncertain behavior in the superior pole of a mildly enlarged right thyroid  lobe in the setting of multiple thyroid  nodules. I have recommended proceeding with completion thyroidectomy for definitive diagnosis and management. We discussed the procedure. We discussed risk and benefits of surgery. We discussed the risk of recurrent laryngeal nerve injury and injury to parathyroid glands. We discussed the size and location of the surgical incision. We discussed the hospital stay to be anticipated. We discussed her postoperative recovery. We discussed the need for lifelong thyroid  hormone replacement. The patient understands and wishes to proceed with surgery in the near future.  Krystal Spinner, MD [facility] practice Office: [phone]

## 2023-05-10 ENCOUNTER — Ambulatory Visit (HOSPITAL_BASED_OUTPATIENT_CLINIC_OR_DEPARTMENT_OTHER): Payer: Commercial Managed Care - PPO | Admitting: Certified Registered"

## 2023-05-10 ENCOUNTER — Ambulatory Visit (HOSPITAL_COMMUNITY)
Admission: RE | Admit: 2023-05-10 | Discharge: 2023-05-11 | Disposition: A | Discharge status: Home / Self Care | Payer: Commercial Managed Care - PPO | Attending: Surgery | Admitting: Surgery

## 2023-05-10 ENCOUNTER — Ambulatory Visit (HOSPITAL_COMMUNITY): Payer: Commercial Managed Care - PPO | Admitting: Certified Registered"

## 2023-05-10 ENCOUNTER — Encounter (HOSPITAL_COMMUNITY)
Admission: RE | Disposition: A | Discharge status: Home / Self Care | Payer: Self-pay | Source: Home / Self Care | Attending: Surgery

## 2023-05-10 ENCOUNTER — Other Ambulatory Visit: Payer: Self-pay

## 2023-05-10 ENCOUNTER — Encounter (HOSPITAL_COMMUNITY): Payer: Self-pay | Admitting: Surgery

## 2023-05-10 DIAGNOSIS — D440 Neoplasm of uncertain behavior of thyroid gland: Secondary | ICD-10-CM | POA: Diagnosis present

## 2023-05-10 DIAGNOSIS — G4733 Obstructive sleep apnea (adult) (pediatric): Secondary | ICD-10-CM

## 2023-05-10 DIAGNOSIS — G4730 Sleep apnea, unspecified: Secondary | ICD-10-CM | POA: Insufficient documentation

## 2023-05-10 DIAGNOSIS — K219 Gastro-esophageal reflux disease without esophagitis: Secondary | ICD-10-CM | POA: Diagnosis not present

## 2023-05-10 DIAGNOSIS — Z923 Personal history of irradiation: Secondary | ICD-10-CM | POA: Insufficient documentation

## 2023-05-10 DIAGNOSIS — Z853 Personal history of malignant neoplasm of breast: Secondary | ICD-10-CM | POA: Insufficient documentation

## 2023-05-10 DIAGNOSIS — E041 Nontoxic single thyroid nodule: Secondary | ICD-10-CM

## 2023-05-10 DIAGNOSIS — E042 Nontoxic multinodular goiter: Secondary | ICD-10-CM | POA: Diagnosis not present

## 2023-05-10 DIAGNOSIS — Z9009 Acquired absence of other part of head and neck: Secondary | ICD-10-CM | POA: Diagnosis present

## 2023-05-10 HISTORY — PX: TOTAL THYROIDECTOMY: SHX2547

## 2023-05-10 SURGERY — THYROIDECTOMY, COMPLETION
Anesthesia: General | Laterality: Right

## 2023-05-10 MED ORDER — CELECOXIB 200 MG PO CAPS: 200.0000 mg | ORAL_CAPSULE | Freq: Once | ORAL | Status: DC

## 2023-05-10 MED ORDER — FENTANYL CITRATE (PF) 100 MCG/2ML IJ SOLN
INTRAMUSCULAR | Status: DC | PRN
  Administered 2023-05-10: 25 ug via INTRAVENOUS
  Administered 2023-05-10 (×3): 50 ug via INTRAVENOUS
  Administered 2023-05-10: 25 ug via INTRAVENOUS
  Administered 2023-05-10: 50 ug via INTRAVENOUS

## 2023-05-10 MED ORDER — PROPOFOL 10 MG/ML IV BOLUS
INTRAVENOUS | Status: AC
  Filled 2023-05-10: qty 20

## 2023-05-10 MED ORDER — DEXAMETHASONE SODIUM PHOSPHATE 10 MG/ML IJ SOLN
INTRAMUSCULAR | Status: DC | PRN
  Administered 2023-05-10: 4 mg via INTRAVENOUS

## 2023-05-10 MED ORDER — CHLORHEXIDINE GLUCONATE CLOTH 2 % EX PADS: 6.0000 | MEDICATED_PAD | Freq: Once | CUTANEOUS | Status: DC

## 2023-05-10 MED ORDER — ONDANSETRON 4 MG PO TBDP: 4.0000 mg | ORAL_TABLET | Freq: Four times a day (QID) | ORAL | Status: DC | PRN

## 2023-05-10 MED ORDER — CHLORHEXIDINE GLUCONATE 0.12 % MT SOLN
15.0000 mL | Freq: Once | OROMUCOSAL | Status: DC
Start: 2023-05-10 — End: 2023-05-10

## 2023-05-10 MED ORDER — PROPOFOL 10 MG/ML IV BOLUS
INTRAVENOUS | Status: DC | PRN
  Administered 2023-05-10: 140 mg via INTRAVENOUS

## 2023-05-10 MED ORDER — FENTANYL CITRATE PF 50 MCG/ML IJ SOSY
25.0000 ug | PREFILLED_SYRINGE | INTRAMUSCULAR | Status: DC | PRN
  Administered 2023-05-10: 50 ug via INTRAVENOUS

## 2023-05-10 MED ORDER — ACETAMINOPHEN 325 MG PO TABS
650.0000 mg | ORAL_TABLET | Freq: Four times a day (QID) | ORAL | Status: DC | PRN
  Administered 2023-05-10: 650 mg via ORAL
  Filled 2023-05-10: qty 2

## 2023-05-10 MED ORDER — ACETAMINOPHEN 10 MG/ML IV SOLN
INTRAVENOUS | Status: AC
  Filled 2023-05-10: qty 100

## 2023-05-10 MED ORDER — ACETAMINOPHEN 650 MG RE SUPP: 650.0000 mg | Freq: Four times a day (QID) | RECTAL | Status: DC | PRN

## 2023-05-10 MED ORDER — ROCURONIUM BROMIDE 10 MG/ML (PF) SYRINGE
PREFILLED_SYRINGE | INTRAVENOUS | Status: DC | PRN
  Administered 2023-05-10: 60 mg via INTRAVENOUS

## 2023-05-10 MED ORDER — ONDANSETRON HCL 4 MG/2ML IJ SOLN
INTRAMUSCULAR | Status: DC | PRN
  Administered 2023-05-10: 4 mg via INTRAVENOUS

## 2023-05-10 MED ORDER — HYDROMORPHONE HCL 1 MG/ML IJ SOLN: 1.0000 mg | INTRAMUSCULAR | Status: DC | PRN

## 2023-05-10 MED ORDER — ONDANSETRON HCL 4 MG/2ML IJ SOLN: 4.0000 mg | Freq: Four times a day (QID) | INTRAMUSCULAR | Status: DC | PRN

## 2023-05-10 MED ORDER — ACETAMINOPHEN 10 MG/ML IV SOLN
1000.0000 mg | Freq: Once | INTRAVENOUS | Status: DC | PRN
  Administered 2023-05-10: 1000 mg via INTRAVENOUS

## 2023-05-10 MED ORDER — MIDAZOLAM HCL 2 MG/2ML IJ SOLN
INTRAMUSCULAR | Status: AC
Start: 2023-05-10 — End: ?
  Filled 2023-05-10: qty 2

## 2023-05-10 MED ORDER — 0.9 % SODIUM CHLORIDE (POUR BTL) OPTIME
TOPICAL | Status: DC | PRN
  Administered 2023-05-10: 1000 mL

## 2023-05-10 MED ORDER — FENTANYL CITRATE PF 50 MCG/ML IJ SOSY
PREFILLED_SYRINGE | INTRAMUSCULAR | Status: AC
  Filled 2023-05-10: qty 2

## 2023-05-10 MED ORDER — ROCURONIUM BROMIDE 10 MG/ML (PF) SYRINGE
PREFILLED_SYRINGE | INTRAVENOUS | Status: AC
  Filled 2023-05-10: qty 10

## 2023-05-10 MED ORDER — FENTANYL CITRATE (PF) 100 MCG/2ML IJ SOLN
INTRAMUSCULAR | Status: AC
  Filled 2023-05-10: qty 2

## 2023-05-10 MED ORDER — CEFAZOLIN SODIUM-DEXTROSE 2-4 GM/100ML-% IV SOLN
2.0000 g | INTRAVENOUS | Status: AC
  Administered 2023-05-10: 2 g via INTRAVENOUS
  Filled 2023-05-10: qty 100

## 2023-05-10 MED ORDER — FAMOTIDINE 20 MG PO TABS: 20.0000 mg | ORAL_TABLET | Freq: Every day | ORAL | Status: DC | PRN

## 2023-05-10 MED ORDER — LIDOCAINE 2% (20 MG/ML) 5 ML SYRINGE
INTRAMUSCULAR | Status: DC | PRN
  Administered 2023-05-10: 60 mg via INTRAVENOUS

## 2023-05-10 MED ORDER — PHENYLEPHRINE 80 MCG/ML (10ML) SYRINGE FOR IV PUSH (FOR BLOOD PRESSURE SUPPORT)
PREFILLED_SYRINGE | INTRAVENOUS | Status: DC | PRN
  Administered 2023-05-10 (×2): 80 ug via INTRAVENOUS

## 2023-05-10 MED ORDER — LIDOCAINE HCL (PF) 2 % IJ SOLN
INTRAMUSCULAR | Status: AC
  Filled 2023-05-10: qty 5

## 2023-05-10 MED ORDER — HEMOSTATIC AGENTS (NO CHARGE) OPTIME
TOPICAL | Status: DC | PRN
  Administered 2023-05-10: 1 via TOPICAL

## 2023-05-10 MED ORDER — TRAMADOL HCL 50 MG PO TABS
50.0000 mg | ORAL_TABLET | Freq: Four times a day (QID) | ORAL | Status: DC | PRN
  Administered 2023-05-10: 50 mg via ORAL
  Filled 2023-05-10: qty 1

## 2023-05-10 MED ORDER — DEXAMETHASONE SODIUM PHOSPHATE 10 MG/ML IJ SOLN
INTRAMUSCULAR | Status: AC
  Filled 2023-05-10: qty 1

## 2023-05-10 MED ORDER — SUGAMMADEX SODIUM 200 MG/2ML IV SOLN
INTRAVENOUS | Status: DC | PRN
  Administered 2023-05-10: 140 mg via INTRAVENOUS

## 2023-05-10 MED ORDER — ORAL CARE MOUTH RINSE: 15.0000 mL | Freq: Once | OROMUCOSAL | Status: DC

## 2023-05-10 MED ORDER — ACETAMINOPHEN 500 MG PO TABS: 1000.0000 mg | ORAL_TABLET | Freq: Once | ORAL | Status: DC

## 2023-05-10 MED ORDER — MIDAZOLAM HCL 2 MG/2ML IJ SOLN
INTRAMUSCULAR | Status: DC | PRN
  Administered 2023-05-10: 2 mg via INTRAVENOUS

## 2023-05-10 MED ORDER — DEXTROSE-SODIUM CHLORIDE 5-0.9 % IV SOLN: INTRAVENOUS | Status: AC

## 2023-05-10 MED ORDER — LACTATED RINGERS IV SOLN: INTRAVENOUS | Status: DC

## 2023-05-10 MED ORDER — ONDANSETRON HCL 4 MG/2ML IJ SOLN
INTRAMUSCULAR | Status: AC
  Filled 2023-05-10: qty 2

## 2023-05-10 MED ORDER — OXYCODONE HCL 5 MG PO TABS: 5.0000 mg | ORAL_TABLET | ORAL | Status: DC | PRN

## 2023-05-10 SURGICAL SUPPLY — 28 items
ATTRACTOMAT 16X20 MAGNETIC DRP (DRAPES) ×1 IMPLANT
BAG COUNTER SPONGE SURGICOUNT (BAG) ×1 IMPLANT
BLADE SURG 15 STRL LF DISP TIS (BLADE) ×1 IMPLANT
CHLORAPREP W/TINT 26 (MISCELLANEOUS) ×1 IMPLANT
CLIP TI MEDIUM 6 (CLIP) ×2 IMPLANT
CLIP TI WIDE RED SMALL 6 (CLIP) ×2 IMPLANT
COVER SURGICAL LIGHT HANDLE (MISCELLANEOUS) ×1 IMPLANT
DERMABOND ADVANCED .7 DNX12 (GAUZE/BANDAGES/DRESSINGS) ×1 IMPLANT
DRAPE LAPAROTOMY T 98X78 PEDS (DRAPES) ×1 IMPLANT
DRAPE UTILITY XL STRL (DRAPES) ×1 IMPLANT
ELECT PENCIL ROCKER SW 15FT (MISCELLANEOUS) ×1 IMPLANT
ELECT REM PT RETURN 15FT ADLT (MISCELLANEOUS) ×1 IMPLANT
GAUZE 4X4 16PLY ~~LOC~~+RFID DBL (SPONGE) ×1 IMPLANT
GLOVE SURG ORTHO 8.0 STRL STRW (GLOVE) ×1 IMPLANT
GOWN STRL REUS W/ TWL XL LVL3 (GOWN DISPOSABLE) ×2 IMPLANT
HEMOSTAT SURGICEL 2X4 FIBR (HEMOSTASIS) ×1 IMPLANT
ILLUMINATOR WAVEGUIDE N/F (MISCELLANEOUS) ×1 IMPLANT
KIT BASIN OR (CUSTOM PROCEDURE TRAY) ×1 IMPLANT
KIT TURNOVER KIT A (KITS) IMPLANT
PACK BASIC VI WITH GOWN DISP (CUSTOM PROCEDURE TRAY) ×1 IMPLANT
SHEARS HARMONIC 9CM CVD (BLADE) ×1 IMPLANT
SUT MNCRL AB 4-0 PS2 18 (SUTURE) ×1 IMPLANT
SUT SILK 3 0 SH 30 (SUTURE) ×1 IMPLANT
SUT VIC AB 3-0 SH 18 (SUTURE) ×2 IMPLANT
SUT VIC AB 3-0 SH 8-18 (SUTURE) IMPLANT
SYR BULB IRRIG 60ML STRL (SYRINGE) ×1 IMPLANT
TOWEL OR 17X26 10 PK STRL BLUE (TOWEL DISPOSABLE) ×1 IMPLANT
TUBING CONNECTING 10 (TUBING) ×1 IMPLANT

## 2023-05-10 NOTE — Anesthesia Preprocedure Evaluation (Signed)
 Anesthesia Evaluation  Patient identified by MRN, date of birth, ID band Patient awake    Reviewed: Allergy & Precautions, NPO status , Patient's Chart, lab work & pertinent test results  Airway Mallampati: II       Dental no notable dental hx.    Pulmonary sleep apnea    Pulmonary exam normal        Cardiovascular negative cardio ROS  Rhythm:Regular Rate:Normal     Neuro/Psych   Anxiety     negative neurological ROS     GI/Hepatic Neg liver ROS,GERD  Medicated,,  Endo/Other  negative endocrine ROS    Renal/GU negative Renal ROS  negative genitourinary   Musculoskeletal  (+) Arthritis , Osteoarthritis,    Abdominal Normal abdominal exam  (+)   Peds  Hematology  (+) Blood dyscrasia, anemia Lab Results      Component                Value               Date                      WBC                      6.0                 05/04/2023                HGB                      11.5 (L)            05/04/2023                HCT                      34.6 (L)            05/04/2023                MCV                      94.5                05/04/2023                PLT                      303                 05/04/2023              Anesthesia Other Findings   Reproductive/Obstetrics                             Anesthesia Physical Anesthesia Plan  ASA: 2  Anesthesia Plan: General   Post-op Pain Management:    Induction: Intravenous  PONV Risk Score and Plan: 3 and Ondansetron , Dexamethasone , Midazolam  and Treatment may vary due to age or medical condition  Airway Management Planned: Mask and Oral ETT  Additional Equipment: None  Intra-op Plan:   Post-operative Plan: Extubation in OR  Informed Consent: I have reviewed the patients History and Physical, chart, labs and discussed the procedure including the risks, benefits and alternatives for the proposed anesthesia with the patient or  authorized representative who has indicated  his/her understanding and acceptance.     Dental advisory given  Plan Discussed with: CRNA  Anesthesia Plan Comments:        Anesthesia Quick Evaluation

## 2023-05-10 NOTE — Interval H&P Note (Signed)
 History and Physical Interval Note:  05/10/2023 6:58 AM  [name] [name]  has presented today for surgery, with the diagnosis of THYROID  NEOPLASM OF UNCERTAIN BEHAVIOR MUILTIPLE THYROID  NODULES.  The various methods of treatment have been discussed with the patient and family. After consideration of risks, benefits and other options for treatment, the patient has consented to    Procedure(s): COMPLETION OF THYROIDECTOMY (N/A) as a surgical intervention.    The patient's history has been reviewed, patient examined, no change in status, stable for surgery.  I have reviewed the patient's chart and labs.  Questions were answered to the patient's satisfaction.    Krystal Spinner, MD [facility] practice Office: [phone]   Krystal Spinner

## 2023-05-10 NOTE — Op Note (Signed)
 Procedure Note  Pre-operative Diagnosis:  multiple thyroid  nodules, thyroid  neoplasm of uncertain behavior  Post-operative Diagnosis:  same  Surgeon:  Krystal Spinner, MD  Assistant:  none   Procedure:  Right thyroid  lobectomy and isthmusectomy (completion thyroidectomy)  Anesthesia:  General  Estimated Blood Loss:  < 10 cc  Drains: none         Specimen: thyroid  lobe to pathology  Indications:  Patient is referred by Dr. Komal Motwani for surgical evaluation and management of a newly diagnosed thyroid  neoplasm of uncertain behavior. Patient's primary care physician is Dr. Charlies Bellini. Patient has a history of thyroid  disease dating back approximately 20 years when she underwent a left thyroid  lobectomy at [facility] by Dr. Inocente Bhat for benign disease. Patient has done well. She is not on thyroid  medication. Recent TSH level is normal at 2.22. Patient underwent a follow-up ultrasound in August 2024. This demonstrated multiple nodules in the right thyroid  lobe. Right lobe was mildly enlarged at 6.7 cm. There was a new nodule in the superior pole measuring 1.6 cm which met criteria for biopsy. Patient underwent fine-needle aspiration biopsy on December 28, 2022. This demonstrated cytologic atypia, Bethesda category III. Specimen was subsequently submitted for molecular genetic testing with AFIRMA. 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 Risk of malignancy was approximately 75%. Patient is therefore referred to surgery for completion thyroidectomy for definitive diagnosis and management.   Procedure Details: Procedure was done in OR #1 at the [facility]. The patient was brought to the operating room and placed in a supine position on the operating room table. Following administration of general anesthesia, the patient was positioned and then prepped and draped in the usual aseptic fashion. After ascertaining that an adequate level of anesthesia  had been achieved, a small Kocher incision was made with #15 blade. Dissection was carried through subcutaneous tissues and platysma. Hemostasis was achieved with the electrocautery. Skin flaps were elevated cephalad and caudad from the thyroid  notch to the sternal notch. A self-retaining retractor was placed for exposure. Strap muscles were incised in the midline and dissection was begun on the right side. Strap muscles were reflected laterally. The right thyroid  lobe was mildly enlarged and nodular. The lobe was gently mobilized with blunt dissection. Superior pole vessels were dissected out and divided individually between small and medium ligaclips with the harmonic scalpel. The thyroid  lobe was rolled anteriorly. Branches of the inferior thyroid  artery were divided between small ligaclips with the harmonic scalpel. Inferior venous tributaries were divided between ligaclips. The superior parathyroid gland was identified and preserved on its vascular pedicle. The recurrent laryngeal nerve was identified and preserved along its course. The ligament of Court was released with the electrocautery and the gland was mobilized onto the anterior trachea. Isthmus was mobilized across the midline to the end of the parenchyma where it was scarred down to the trachea. A suture was used to mark the isthmus margin. The thyroid  lobe and isthmus were submitted to pathology for review.  The entire field was palpated for evidence of lymphadenopathy or extra-thyroidal disease.  No worrisome findings were noted.  No enlarged lymph nodes were identified.  The neck was irrigated with warm saline. Fibrillar was placed throughout the operative field. Strap muscles were approximated in the midline with interrupted 3-0 Vicryl sutures. Platysma was closed with interrupted 3-0 Vicryl sutures. Skin was closed with a running 4-0 Monocryl subcuticular suture.  Wound was washed and dried and Dermabond was  applied. The patient was awakened  from anesthesia and brought to the recovery room. The patient tolerated the procedure well.   Krystal Spinner, MD [facility] Surgery Office: [phone]

## 2023-05-10 NOTE — Anesthesia Postprocedure Evaluation (Signed)
 Anesthesia Post Note  Patient: [name] [name]  Procedure(s) Performed: COMPLETION OF THYROIDECTOMY (Right)     Patient location during evaluation: PACU Anesthesia Type: General Level of consciousness: awake and alert Pain management: pain level controlled Vital Signs Assessment: post-procedure vital signs reviewed and stable Respiratory status: spontaneous breathing, nonlabored ventilation, respiratory function stable and patient connected to nasal cannula oxygen Cardiovascular status: blood pressure returned to baseline and stable Postop Assessment: no apparent nausea or vomiting Anesthetic complications: no   No notable events documented.  Last Vitals:  Vitals:   05/10/23 1045 05/10/23 1152  BP: (!) 153/81 134/76  Pulse: 79 82  Resp: 18   Temp: 36.9 C 36.6 C  SpO2: 100% 98%    Last Pain:  Vitals:   05/10/23 1152  TempSrc: Oral  PainSc:                  Cordella SQUIBB Refugio Mcconico

## 2023-05-10 NOTE — Transfer of Care (Signed)
 Immediate Anesthesia Transfer of Care Note  Patient: [name] [name]  Procedure(s) Performed: COMPLETION OF THYROIDECTOMY (Right)  Patient Location: PACU  Anesthesia Type:General  Level of Consciousness: awake, drowsy, and patient cooperative  Airway & Oxygen Therapy: Patient Spontanous Breathing and Patient connected to face mask oxygen  Post-op Assessment: Report given to RN and Post -op Vital signs reviewed and stable  Post vital signs: Reviewed and stable  Last Vitals:  Vitals Value Taken Time  BP 148/85 05/10/23 0915  Temp 36.8 C 05/10/23 0915  Pulse 87 05/10/23 0915  Resp 8 05/10/23 0915  SpO2 96 % 05/10/23 0915  Vitals shown include unfiled device data.  Last Pain:  Vitals:   05/10/23 0605  TempSrc: Oral  PainSc:       Patients Stated Pain Goal: 4 (05/10/23 0600)  Complications: No notable events documented.

## 2023-05-10 NOTE — Anesthesia Procedure Notes (Signed)
 Procedure Name: Intubation Date/Time: 05/10/2023 7:40 AM  Performed by: Metta Andrea NOVAK, CRNAPre-anesthesia Checklist: Patient identified, Emergency Drugs available, Suction available, Patient being monitored and Timeout performed Patient Re-evaluated:Patient Re-evaluated prior to induction Oxygen Delivery Method: Circle system utilized Preoxygenation: Pre-oxygenation with 100% oxygen Induction Type: IV induction Ventilation: Mask ventilation without difficulty Laryngoscope Size: Mac Grade View: Grade I Tube type: Oral Tube size: 7.0 mm Number of attempts: 1 Airway Equipment and Method: Stylet Placement Confirmation: ETT inserted through vocal cords under direct vision, positive ETCO2 and breath sounds checked- equal and bilateral Secured at: 21 cm Tube secured with: Tape Dental Injury: Teeth and Oropharynx as per pre-operative assessment

## 2023-05-10 NOTE — Plan of Care (Signed)
   Problem: Education: Goal: Knowledge of General Education information will improve Description: Including pain rating scale, medication(s)/side effects and non-pharmacologic comfort measures Outcome: Progressing   Problem: Activity: Goal: Risk for activity intolerance will decrease Outcome: Progressing

## 2023-05-11 DIAGNOSIS — E042 Nontoxic multinodular goiter: Secondary | ICD-10-CM | POA: Diagnosis not present

## 2023-05-11 LAB — SURGICAL PATHOLOGY

## 2023-05-11 LAB — CALCIUM: Calcium: 8 mg/dL — ABNORMAL LOW (ref 8.9–10.3)

## 2023-05-11 MED ORDER — OXYCODONE HCL 5 MG PO TABS: 5.0000 mg | ORAL_TABLET | ORAL | Status: DC | PRN

## 2023-05-11 MED ORDER — CALCIUM CARBONATE 1250 (500 CA) MG PO TABS
2.0000 | ORAL_TABLET | Freq: Three times a day (TID) | ORAL | Status: DC
  Administered 2023-05-11: 2500 mg via ORAL
  Filled 2023-05-11: qty 2

## 2023-05-11 MED ORDER — CALCIUM GLUCONATE-NACL 2-0.675 GM/100ML-% IV SOLN
2.0000 g | INTRAVENOUS | Status: AC
  Administered 2023-05-11: 2000 mg via INTRAVENOUS
  Filled 2023-05-11: qty 100

## 2023-05-11 MED ORDER — LEVOTHYROXINE SODIUM 100 MCG PO TABS: 100.0000 ug | ORAL_TABLET | Freq: Every day | ORAL | 3 refills | Status: DC

## 2023-05-11 MED ORDER — CALCIUM CARBONATE ANTACID 500 MG PO CHEW: 2.0000 | CHEWABLE_TABLET | Freq: Three times a day (TID) | ORAL | 1 refills | Status: DC

## 2023-05-11 NOTE — Discharge Instructions (Signed)

## 2023-05-11 NOTE — Progress Notes (Signed)
 Patient was given discharge instructions, and all questions were answered. Patient was stable for discharge and was walked to the main exit.

## 2023-05-11 NOTE — Plan of Care (Signed)
   Problem: Education: Goal: Knowledge of General Education information will improve Description: Including pain rating scale, medication(s)/side effects and non-pharmacologic comfort measures Outcome: Progressing   Problem: Nutrition: Goal: Adequate nutrition will be maintained Outcome: Progressing

## 2023-05-11 NOTE — Discharge Summary (Signed)
 Physician Discharge Summary   Patient ID: [name] [name] MRN: [id number] DOB/AGE: 1960-09-02 63 y.o.  Admit date: 05/10/2023  Discharge date: 05/11/2023  Discharge Diagnoses:  Principal Problem:   Neoplasm of uncertain behavior of thyroid  gland Active Problems:   Multiple thyroid  nodules   History of lobectomy of thyroid    Discharged Condition: good  Hospital Course: Patient was admitted for observation following completion thyroidectomy.  Post op course was uncomplicated.  Pain was well controlled.  Tolerated diet.  Post op calcium  level on morning following surgery was 8.0 mg/dl. Patient received 2 gm calcium  gluconate IVPB and oral calcium  prior to discharge.  Patient was prepared for discharge home on POD#1.  Consults: None  Treatments: surgery: completion thyroidectomy (right thyroid  lobe and isthmus)  Discharge Exam: Blood pressure 121/70, pulse 77, temperature 98 F (36.7 C), temperature source Oral, resp. rate 15, height 5' 7.25 (1.708 m), weight 70.8 kg, SpO2 97%. HEENT - clear Neck - wound dry and intact; mild ecchymosis; mild STS; voice normal  Disposition: Home  Discharge Instructions     Diet - low sodium heart healthy   Complete by: As directed    Increase activity slowly   Complete by: As directed    No dressing needed   Complete by: As directed       Allergies as of 05/11/2023   No Known Allergies      Medication List     TAKE these medications    calcium  carbonate 500 MG chewable tablet Commonly known as: Tums Chew 2 tablets (400 mg of elemental calcium  total) by mouth 3 (three) times daily.   famotidine  20 MG tablet Commonly known as: PEPCID  Take 20 mg by mouth daily as needed for heartburn or indigestion.   ibuprofen  200 MG tablet Commonly known as: ADVIL  Take 200 mg by mouth every 8 (eight) hours as needed (pain/headaches.).   K2 PO Take 1 capsule by mouth daily with supper. MK-7 Vitamin K-2   KRILL OIL PO Take 500 mg by mouth  daily with supper.   levothyroxine  100 MCG tablet Commonly known as: Synthroid  Take 1 tablet (100 mcg total) by mouth daily before breakfast.   MAGNESIUM GLYCINATE PO Take 1 capsule by mouth 4 (four) times a week. At bedtime.   omeprazole  20 MG capsule Commonly known as: PRILOSEC Take 1 capsule (20 mg total) by mouth daily. What changed:  when to take this reasons to take this   OVER THE COUNTER MEDICATION Take 1 capsule by mouth daily with supper.   Vitamin A & D 10000-400 units Tabs Take 1 tablet by mouth daily with supper. Now Ultra A & D-3   vitamin C 1000 MG tablet Take 1,000 mg by mouth daily.   Vitamin D3 125 MCG (5000 UT) Tabs Take by mouth daily with supper.               Discharge Care Instructions  (From admission, onward)           Start     Ordered   05/11/23 0000  No dressing needed        05/11/23 1336            Follow-up Information     Eletha Boas, MD. Schedule an appointment as soon as possible for a visit in 3 week(s).   Specialty: General Surgery Why: For wound re-check Contact information: [address] [place] [address] [phone]  Krystal Spinner, MD [facility] Surgery Office: [phone]   Signed: Krystal Spinner 05/11/2023, 1:36 PM

## 2023-05-13 NOTE — Progress Notes (Signed)
 CXR is completely benign.  Darnell Level, MD [facility] practice Office: [phone]

## 2023-05-16 NOTE — Progress Notes (Signed)
 Good news!  Final pathology is benign.  I will have printed copy for you at your post op visit.  Krystal Spinner, MD [facility] practice Office: [phone]

## 2023-06-07 ENCOUNTER — Other Ambulatory Visit: Payer: Self-pay | Admitting: Family Medicine

## 2023-06-13 ENCOUNTER — Other Ambulatory Visit: Payer: Self-pay

## 2023-06-13 ENCOUNTER — Encounter: Payer: Self-pay | Admitting: "Endocrinology

## 2023-06-27 ENCOUNTER — Ambulatory Visit: Payer: Commercial Managed Care - PPO | Admitting: "Endocrinology

## 2023-07-19 ENCOUNTER — Encounter: Payer: Self-pay | Admitting: "Endocrinology

## 2023-07-19 ENCOUNTER — Ambulatory Visit (INDEPENDENT_AMBULATORY_CARE_PROVIDER_SITE_OTHER): Admitting: "Endocrinology

## 2023-07-19 VITALS — BP 130/70 | HR 75 | Ht 67.25 in | Wt 158.0 lb

## 2023-07-19 DIAGNOSIS — E890 Postprocedural hypothyroidism: Secondary | ICD-10-CM

## 2023-07-19 DIAGNOSIS — E559 Vitamin D deficiency, unspecified: Secondary | ICD-10-CM | POA: Diagnosis not present

## 2023-07-19 NOTE — Progress Notes (Signed)
 Outpatient Endocrinology Note [name] Morris, MD  07/19/23   [name] [name] 06-26-1960 [id number]  Referring Provider: Natalia Leatherwood, DO Primary Care Provider: Natalia Leatherwood, DO Subjective  No chief complaint on file.   Assessment & Plan  Diagnoses and all orders for this visit:  Postoperative hypothyroidism -     TSH(Reflex)  Vitamin D deficiency -     Renal function panel -     VITAMIN D 25 Hydroxy (Vit-D Deficiency, Fractures)  Hypocalcemia -     Renal function panel -     VITAMIN D 25 Hydroxy (Vit-D Deficiency, Fractures)     [name] [name] is currently not on any thyroid medication. 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 Has history of stage 3 breast cancer in 2015 s/p surgery, chemo and radiation 12/05/2022 thyroid ultrasound revealed multinodular right thyroid with 1.6 x 1.5 x 1.3 cm superior right TR 4 thyroid nodule status post FNA on 12/28/2022 showing atypia of undetermined significance, Bethesda category III status post Afirma showing 75% suspicion for malignancy Discussed with patient at length the pros and cons of thyroidectomy, need for lifelong thyroid hormone replacement  05/12/02 pathology records: Pathology excision of left thyroid nodule: Benign S/p Right thyroid lobectomy and isthmusectomy (completion thyroidectomy) on  05/10/2023 by Darnell Level, MD Final pathology is benign. Operative Calcium normal at 8.9   On 100 mcg levothyroxine qam, takes appropriately  Not taking calcium, and Vit D 2000 IU -  05/2023 last Ca low at 8, no albumin Start Calcium carbonate 600mg  bid with food  Start Vit D 2000 IU daily   I have reviewed current medications, nurse's notes, allergies, vital signs, past  medical and surgical history, family medical history, and social history for this encounter. Counseled patient on symptoms, examination findings, lab findings, imaging results, treatment decisions and monitoring and prognosis. The patient understood the recommendations and agrees with the treatment plan. All questions regarding treatment plan were fully answered.   Return in about 3 months (around 10/19/2023) for visit + labs before next visit, labs today.   [name] [place]um[place], MD  07/19/23   I have reviewed current medications, nurse's notes, allergies, vital signs, past medical and surgical history, family medical history, and social history for this encounter. Counseled patient on symptoms, examination findings, lab findings, imaging results, treatment decisions and monitoring and prognosis. The patient understood the recommendations and agrees with the treatment plan. All questions regarding treatment plan were fully answered.   History of Present Illness [name] [name] is a 63 y.o. year old female who presents to our clinic with multinodular goiter.   No peri-oral/peri-ungual numbness/tingling/seizures No dysphagia/dysphonia/dyspnea No constipation/hyperdefecation, no weight loss/gain, no heat/cold intolerance, excessive fatigue, palpitations   05/12/02 pathology records: Pathology excision of left thyroid nodule: Benign S/p Right thyroid lobectomy and isthmusectomy (completion thyroidectomy) on  05/10/2023 by Darnell Level, MD Final pathology is benign. Operative Calcium normal at 8.9  Had left thyroidectomy in 2003 at [facility] for a recurrent thyroid cyst  Has history of stage 3 breast cancer in 2015 s/p surgery, chemo and radiation   12/05/22 CLINICAL DATA:  63 year old female with enlarged thyroid with voice changes   EXAM: THYROID ULTRASOUND   TECHNIQUE: Ultrasound examination of the thyroid gland  and adjacent soft tissues was performed.   COMPARISON:   08/11/2015   FINDINGS: Parenchymal Echotexture: Moderately heterogenous   Isthmus: 0.3 cm   Right lobe: 6.7 cm x 2.4 cm x 2.1 cm   Left lobe: Prior left hemithyroidectomy   _________________________________________________________   Estimated total number of nodules >/= 1 cm: 1   Number of spongiform nodules >/=  2 cm not described below (TR1): 0   Number of mixed cystic and solid nodules >/= 1.5 cm not described below (TR2): 0   _________________________________________________________   Nodule labeled 1 superior right thyroid, new from the comparison, 1.6 cm x 1.5 cm x 1.3 cm. Nodule has TR 4 characteristics and meets criteria for biopsy.   The nodules labeled 2, 3, 4 are all less than 10 mm, none with suspicious/high risk features. None meet criteria for surveillance.   No adenopathy   IMPRESSION: New right superior thyroid nodule (labeled 1, 1.6 cm, TR 4) meets criteria for biopsy, as designated by the newly established ACR TI-RADS criteria, and referral for biopsy is recommended.   Left hemithyroidectomy.  12/28/22 nical History: Nodule labeled 1 superior right thyroid, new from the  comparison, 1.6cm x 1.5cm x 1.3cm. Nodule has TR4 characteristics and  meets criteria for biopsy  Specimen Submitted:  A. THYROID, RT SUPERIOR NODULE#1, FINE NEEDLE  ASPIRATION    FINAL MICROSCOPIC DIAGNOSIS:  - Atypia of undetermined significance (Bethesda category III)      Physical Exam  BP 130/70   Pulse 75   Ht 5' 7.25" (1.708 m)   Wt 158 lb (71.7 kg)   SpO2 97%   BMI 24.56 kg/m  Constitutional: well developed, well nourished Head: normocephalic, atraumatic, no exophthalmos Eyes: sclera anicteric, no redness Neck: + thyroidectomy scar, no thyroid tenderness Lungs: normal respiratory effort Neurology: alert and oriented Skin: dry, no appreciable rashes Musculoskeletal: no appreciable defects Psychiatric: normal mood and affect  Allergies No Known  Allergies  Current Medications Patient's Medications  New Prescriptions   No medications on file  Previous Medications   ASCORBIC ACID (VITAMIN C) 1000 MG TABLET    Take 1,000 mg by mouth daily.   CALCIUM CARBONATE (TUMS) 500 MG CHEWABLE TABLET    Chew 2 tablets (400 mg of elemental calcium total) by mouth 3 (three) times daily.   CHOLECALCIFEROL (VITAMIN D3) 125 MCG (5000 UT) TABS    Take by mouth daily with supper.   FAMOTIDINE (PEPCID) 20 MG TABLET    Take 20 mg by mouth daily as needed for heartburn or indigestion.   IBUPROFEN (ADVIL) 200 MG TABLET    Take 200 mg by mouth every 8 (eight) hours as needed (pain/headaches.).   KRILL OIL PO    Take 500 mg by mouth daily with supper.   LEVOTHYROXINE (SYNTHROID) 100 MCG TABLET    Take 1 tablet (100 mcg total) by mouth daily before breakfast.   MAGNESIUM GLYCINATE PO    Take 1 capsule by mouth 4 (four) times a week. At bedtime.   MENAQUINONE-7 (K2 PO)    Take 1 capsule by mouth daily with supper. MK-7 Vitamin K-2   OMEPRAZOLE (PRILOSEC) 20 MG CAPSULE    TAKE ONE CAPSULE BY MOUTH DAILY   OVER THE COUNTER MEDICATION    Take 1 capsule by mouth daily with supper.   VITAMINS A & D (VITAMIN A & D) 10000-400 UNITS TABS    Take 1 tablet by mouth daily with supper. Now Ultra A & D-3  Modified Medications  No medications on file  Discontinued Medications   No medications on file    Past Medical History Past Medical History:  Diagnosis Date   Anemia in neoplastic disease 02/06/2014   Anxiety disorder    Mild: treated by her gynecologist with selective serotonin reuptake inhibitor   Arthritis    hands and feet   Breast cancer (HCC) 09/09/2013   Family history of malignant neoplasm of breast    Fibroid uterus    hysterctomy   GERD (gastroesophageal reflux disease)    Hot flashes    Hyperlipidemia    Malignant neoplasm of breast (female), unspecified site 08/07/2013   left breast invasive mammary ca in situ   Nonspecific abnormal  electrocardiogram (ECG) (EKG) 2009   Osteoarthritis    Palpitations 2009   Improved; Secondary to premature ventricular contractions. No problems since-pt states was caring for ailing father, anxiety   Personal history of radiation therapy    left breast 2015   Psychophysiological insomnia 04/15/2020   Skin lesion 08/10/2015   Right forearm and left shoulder  [place] dermatology note 08/2015: Dr. Lenis Dickinson, inflamed seborrheic keratosis left posterior shoulder, no treatment. Actinic keratosis on right arm with cryotherapy. Benign scattered seborrheic keratosis.   Thyroid disease    Tick bite of right thigh 2016   Use of tamoxifen (Nolvadex) 08/10/2015   Started: 03/2014    Wears glasses     Past Surgical History Past Surgical History:  Procedure Laterality Date   ABDOMINAL HYSTERECTOMY  02/07/2011   Procedure: HYSTERECTOMY ABDOMINAL;  Surgeon: Zelphia Cairo;  Location: [facility];  Service: Gynecology;  Laterality: N/A;   BREAST EXCISIONAL BIOPSY Right 10/07/2014   high risk   BREAST EXCISIONAL BIOPSY Left 12/24/2015   benign   BREAST LUMPECTOMY Left 09/09/2013   left breast 2015   BREAST LUMPECTOMY WITH NEEDLE LOCALIZATION AND AXILLARY LYMPH NODE DISSECTION Left 09/09/2013   Procedure: LEFT BREAST LUMPECTOMY WITH NEEDLE LOCALIZATION AND LEFT AXILLARY LYMPH NODE DISSECTION;  Surgeon: Mariella Saa, MD;  Location: [facility];  Service: General;  Laterality: Left;   BREAST LUMPECTOMY WITH RADIOACTIVE SEED LOCALIZATION Right 10/07/2014   Procedure: RIGHT BREAST LUMPECTOMY WITH RADIOACTIVE SEED LOCALIZATION;  Surgeon: Glenna Fellows, MD;  Location: [facility];  Service: General;  Laterality: Right;   BREAST LUMPECTOMY WITH RADIOACTIVE SEED LOCALIZATION Left 12/24/2015   Procedure: LEFT BREAST LUMPECTOMY WITH RADIOACTIVE SEED LOCALIZATION;  Surgeon: Glenna Fellows, MD;  Location: [facility];  Service: General;  Laterality: Left;    CESAREAN SECTION     DILATION AND CURETTAGE OF UTERUS     ENDOMETRIAL ABLATION  2006   LAPAROSCOPIC ASSISTED VAGINAL HYSTERECTOMY  02/07/2011   Procedure: LAPAROSCOPIC ASSISTED VAGINAL HYSTERECTOMY;  Surgeon: Zelphia Cairo;  Location: [facility];  Service: Gynecology;  Laterality: N/A;   PORTACATH PLACEMENT Right 09/29/2013   Procedure: INSERTION PORT-A-CATH;  Surgeon: Mariella Saa, MD;  Location: [facility];  Service: General;  Laterality: Right;   THYROIDECTOMY, PARTIAL Left 2001    Family History family history includes Angina in her father; Aortic aneurysm in her father; Arrhythmia in her maternal aunt, maternal aunt and another family member; Breast cancer in her maternal aunt, maternal aunt, and mother; Breast cancer (age of onset: 68) in her maternal aunt; CAD in her father; Cancer in her paternal uncle; Colon cancer in her maternal grandmother; Dementia in her father; Hypertension in an other family member; Non-Hodgkin's lymphoma in her mother; Rectal cancer in her paternal grandmother; Thyroid  disease in her maternal aunt, maternal grandmother, and maternal uncle.  Social History Social History   Socioeconomic History   Marital status: Married    Spouse name: Not on file   Number of children: Not on file   Years of education: Not on file   Highest education level: Some college, no degree  Occupational History   Occupation: Register of deeds    Comment: Full time  Tobacco Use   Smoking status: Never    Passive exposure: Never   Smokeless tobacco: Never  Vaping Use   Vaping status: Never Used  Substance and Sexual Activity   Alcohol use: Yes    Alcohol/week: 2.0 standard drinks of alcohol    Types: 2 Glasses of wine per week    Comment: occas.   Drug use: Not Currently    Comment: smoked marijuana last month   Sexual activity: Yes    Birth control/protection: Surgical  Other Topics Concern   Not on file  Social History Narrative   Married, Onalee Hua.  2 children Arlys John and [place])   HS graduation; [employer] employee (desk job)   Nonsmoker, 3-5 drinks weekly of etoh,  Denies drugs.    Drinks caffeine beverages   Wears seatbelt, smoke detector in the home   Firearms in the home   Exercises >3 x week.    Feels safe in relationships.    Social Drivers of Corporate investment banker Strain: Low Risk  (12/01/2022)   Overall Financial Resource Strain (CARDIA)    Difficulty of Paying Living Expenses: Not hard at all  Food Insecurity: No Food Insecurity (05/10/2023)   Hunger Vital Sign    Worried About Running Out of Food in the Last Year: Never true    Ran Out of Food in the Last Year: Never true  Transportation Needs: No Transportation Needs (05/10/2023)   PRAPARE - Administrator, Civil Service (Medical): No    Lack of Transportation (Non-Medical): No  Physical Activity: Insufficiently Active (12/01/2022)   Exercise Vital Sign    Days of Exercise per Week: 3 days    Minutes of Exercise per Session: 30 min  Stress: No Stress Concern Present (12/01/2022)   [employer] of Occupational Health - Occupational Stress Questionnaire    Feeling of Stress : Not at all  Social Connections: Moderately Integrated (12/01/2022)   Social Connection and Isolation Panel [NHANES]    Frequency of Communication with Friends and Family: More than three times a week    Frequency of Social Gatherings with Friends and Family: Once a week    Attends Religious Services: Never    Database administrator or Organizations: Yes    Attends Engineer, structural: More than 4 times per year    Marital Status: Married  Catering manager Violence: Not At Risk (05/10/2023)   Humiliation, Afraid, Rape, and Kick questionnaire    Fear of Current or Ex-Partner: No    Emotionally Abused: No    Physically Abused: No    Sexually Abused: No    Laboratory Investigations Lab Results  Component Value Date   TSH 2.22 01/18/2023   TSH 1.65 12/01/2022   TSH  1.92 08/19/2021   FREET4 1.04 01/18/2023   FREET4 0.96 12/01/2022   FREET4 0.85 01/30/2019     No results found for: "TSI"   No components found for: "TRAB"   Lab Results  Component Value Date   CHOL 218 (H) 01/26/2023   Lab Results  Component  Value Date   HDL 44.50 01/26/2023   Lab Results  Component Value Date   LDLCALC 132 (H) 01/26/2023   Lab Results  Component Value Date   TRIG 209.0 (H) 01/26/2023   Lab Results  Component Value Date   CHOLHDL 5 01/26/2023   Lab Results  Component Value Date   CREATININE 0.99 05/04/2023   Lab Results  Component Value Date   GFR 78.10 01/26/2023      Component Value Date/Time   NA 138 05/04/2023 1423   NA 139 01/12/2020 0000   NA 142 10/26/2016 1141   K 4.2 05/04/2023 1423   K 3.8 10/26/2016 1141   CL 105 05/04/2023 1423   CO2 24 05/04/2023 1423   CO2 24 10/26/2016 1141   GLUCOSE 107 (H) 05/04/2023 1423   GLUCOSE 122 10/26/2016 1141   BUN 22 05/04/2023 1423   BUN 15 01/12/2020 0000   BUN 13.2 10/26/2016 1141   CREATININE 0.99 05/04/2023 1423   CREATININE 0.85 02/19/2019 1148   CREATININE 0.9 10/26/2016 1141   CALCIUM 8.0 (L) 05/11/2023 0456   CALCIUM 9.1 10/26/2016 1141   PROT 6.9 01/26/2023 0934   PROT 6.8 10/26/2016 1141   ALBUMIN 4.3 01/26/2023 0934   ALBUMIN 3.7 10/26/2016 1141   AST 15 01/26/2023 0934   AST 22 02/19/2019 1148   AST 20 10/26/2016 1141   ALT 19 01/26/2023 0934   ALT 26 02/19/2019 1148   ALT 26 10/26/2016 1141   ALKPHOS 86 01/26/2023 0934   ALKPHOS 72 10/26/2016 1141   BILITOT 0.4 01/26/2023 0934   BILITOT 0.4 02/19/2019 1148   BILITOT 0.28 10/26/2016 1141   GFRNONAA >60 05/04/2023 1423   GFRNONAA >60 02/19/2019 1148   GFRAA 74 01/12/2020 0000   GFRAA >60 02/19/2019 1148      Latest Ref Rng & Units 05/11/2023    4:56 AM 05/04/2023    2:23 PM 01/26/2023    9:34 AM  BMP  Glucose 70 - 99 mg/dL  657  99   BUN 8 - 23 mg/dL  22  16   Creatinine 8.46 - 1.00 mg/dL  9.62  9.52   Sodium 841  - 145 mmol/L  138  140   Potassium 3.5 - 5.1 mmol/L  4.2  4.7   Chloride 98 - 111 mmol/L  105  104   CO2 22 - 32 mmol/L  24  29   Calcium 8.9 - 10.3 mg/dL 8.0  8.8  9.3        Component Value Date/Time   WBC 6.0 05/04/2023 1423   RBC 3.66 (L) 05/04/2023 1423   HGB 11.5 (L) 05/04/2023 1423   HGB 11.6 (L) 02/19/2019 1148   HGB 13.1 10/26/2016 1141   HCT 34.6 (L) 05/04/2023 1423   HCT 38.2 10/26/2016 1141   PLT 303 05/04/2023 1423   PLT 211 02/19/2019 1148   PLT 223 10/26/2016 1141   MCV 94.5 05/04/2023 1423   MCV 93.2 10/26/2016 1141   MCH 31.4 05/04/2023 1423   MCHC 33.2 05/04/2023 1423   RDW 13.5 05/04/2023 1423   RDW 12.6 10/26/2016 1141   LYMPHSABS 1.8 01/26/2023 0934   LYMPHSABS 1.4 10/26/2016 1141   MONOABS 0.4 01/26/2023 0934   MONOABS 0.4 10/26/2016 1141   EOSABS 0.4 01/26/2023 0934   EOSABS 0.2 10/26/2016 1141   BASOSABS 0.0 01/26/2023 0934   BASOSABS 0.0 10/26/2016 1141      Parts of this note may have been dictated using voice recognition software.  There may be variances in spelling and vocabulary which are unintentional. Not all errors are proofread. Please notify the Thereasa Parkin if any discrepancies are noted or if the meaning of any statement is not clear.

## 2023-07-19 NOTE — Patient Instructions (Signed)
 Calcium carbonate 600mg  bid with food

## 2023-07-20 LAB — RENAL FUNCTION PANEL
Albumin: 4.4 g/dL (ref 3.6–5.1)
BUN: 16 mg/dL (ref 7–25)
CO2: 27 mmol/L (ref 20–32)
Calcium: 9 mg/dL (ref 8.6–10.4)
Chloride: 104 mmol/L (ref 98–110)
Creat: 0.93 mg/dL (ref 0.50–1.05)
Glucose, Bld: 96 mg/dL (ref 65–99)
Phosphorus: 3.9 mg/dL (ref 2.5–4.5)
Potassium: 4.9 mmol/L (ref 3.5–5.3)
Sodium: 138 mmol/L (ref 135–146)

## 2023-07-20 LAB — REFLEX TIQ

## 2023-07-20 LAB — TSH(REFL): TSH: 2.09 m[IU]/L (ref 0.40–4.50)

## 2023-07-20 LAB — VITAMIN D 25 HYDROXY (VIT D DEFICIENCY, FRACTURES): Vit D, 25-Hydroxy: 46 ng/mL (ref 30–100)

## 2023-07-24 ENCOUNTER — Other Ambulatory Visit: Payer: Self-pay | Admitting: Family Medicine

## 2023-07-24 DIAGNOSIS — Z1231 Encounter for screening mammogram for malignant neoplasm of breast: Secondary | ICD-10-CM

## 2023-09-05 ENCOUNTER — Other Ambulatory Visit: Payer: Self-pay | Admitting: Family Medicine

## 2023-09-05 NOTE — Telephone Encounter (Signed)
 Last Fill: 05/11/23  Last OV: 01/26/23 Next OV: 01/28/24  Routing to provider for review/authorization.

## 2023-09-05 NOTE — Telephone Encounter (Signed)
 Copied from CRM [id number]. Topic: Clinical - Medication Refill >> Sep 05, 2023 12:22 PM Chuck Crater wrote: Medication: levothyroxine  (SYNTHROID ) 100 MCG tablet  Has the patient contacted their pharmacy? Yes (Agent: If no, request that the patient contact the pharmacy for the refill. If patient does not wish to contact the pharmacy document the reason why and proceed with request.) (Agent: If yes, when and what did the pharmacy advise?) no refills; pt doesn't see her Endocrinologist until June and was advised to call pcp  This is the patient's preferred pharmacy:  [facility] [place] - [address] [place] [address] Phone: [phone] Fax: [phone]  Is this the correct pharmacy for this prescription? Yes If no, delete pharmacy and type the correct one.   Has the prescription been filled recently? Yes  Is the patient out of the medication? No 2-3 days left Pt is going out of town on Saturday  Has the patient been seen for an appointment in the last year OR does the patient have an upcoming appointment? Yes  Can we respond through MyChart? Yes  Agent: Please be advised that Rx refills may take up to 3 business days. We ask that you follow-up with your pharmacy.

## 2023-09-06 ENCOUNTER — Other Ambulatory Visit: Payer: Self-pay

## 2023-09-06 ENCOUNTER — Encounter: Payer: Self-pay | Admitting: Family Medicine

## 2023-09-06 DIAGNOSIS — E890 Postprocedural hypothyroidism: Secondary | ICD-10-CM

## 2023-09-06 MED ORDER — LEVOTHYROXINE SODIUM 100 MCG PO TABS: 100.0000 ug | ORAL_TABLET | Freq: Every day | ORAL | 0 refills | Status: DC

## 2023-09-06 NOTE — Telephone Encounter (Signed)
 No further action needed at this time.

## 2023-09-17 ENCOUNTER — Encounter: Payer: Self-pay | Admitting: Podiatry

## 2023-09-17 ENCOUNTER — Ambulatory Visit (INDEPENDENT_AMBULATORY_CARE_PROVIDER_SITE_OTHER)

## 2023-09-17 ENCOUNTER — Ambulatory Visit: Admitting: Podiatry

## 2023-09-17 DIAGNOSIS — M7752 Other enthesopathy of left foot: Secondary | ICD-10-CM

## 2023-09-17 DIAGNOSIS — M7751 Other enthesopathy of right foot: Secondary | ICD-10-CM

## 2023-09-17 DIAGNOSIS — M205X1 Other deformities of toe(s) (acquired), right foot: Secondary | ICD-10-CM | POA: Diagnosis not present

## 2023-09-17 DIAGNOSIS — G5762 Lesion of plantar nerve, left lower limb: Secondary | ICD-10-CM

## 2023-09-17 NOTE — Progress Notes (Signed)
  Subjective:  Patient ID: [name] [name], female    DOB: Aug 02, 1960,  MRN: [id number]  Chief Complaint  Patient presents with   Foot Pain    Pt is here for bilateral foot pain ongoing for years.    Discussed the use of AI scribe software for clinical note transcription with the patient, who gave verbal consent to proceed.  History of Present Illness [name] [name] is a 63 year old female who presents with chronic bilateral foot pain.  She experiences chronic pain in both feet, with the right foot and big toe being more problematic. The sensation is similar to a 'trigger toe,' with creaking and cracking sounds. Pain is intermittent, with a 'rubber band plunking' sensation when walking and tightness at rest. Despite a high pain tolerance, the sensation is not severe.  She has tried using a pad in her shoe and a toe loop without relief. There are no recent foot injuries. Occasionally, she experiences numbness in her toes mostly at the left 3rd and 4th toes.  Ibuprofen  is taken for hip pain, which may also help her foot symptoms. Ice is used only when pain is severe.      Objective:    Physical Exam General: AAO x3, NAD  Dermatological: Skin is warm, dry and supple bilateral.  There are no open sores, no preulcerative lesions, no rash or signs of infection present.  Vascular: Dorsalis Pedis artery and Posterior Tibial artery pedal pulses are 2/4 bilateral with immedate capillary fill time.  There is no pain with calf compression, swelling, warmth, erythema.   Neruologic: Grossly intact via light touch bilateral.   Musculoskeletal: Tenderness palpation on the left third interspace.  She does get reproduction of symptoms into the 3rd and 4th toes upon palpation of the interspace with there is no palpable neuroma identified at this time.  There is no area pinpoint tenderness.  She also gets subjectively tenderness to the right first MTPJ but no significant pain today.  No crepitation  today.  Gait: Unassisted, Nonantalgic.     No images are attached to the encounter.    Results RADIOLOGY Foot X-ray: Hallux valgus on left foot, osteophytes on right foot, osteoarthritis in right foot joint   Assessment:   1. Morton's neuroma, left   2. Capsulitis of metatarsophalangeal (MTP) joint of right foot      Plan:  Patient was evaluated and treated and all questions answered.  Assessment and Plan Assessment & Plan Morton's neuroma, left foot Chronic discomfort between third and fourth metatarsals due to Morton's neuroma.  Conservative measures preferred initially we discussed surgical intervention if needed in the future. - Advise icing to reduce inflammation. - Continue ibuprofen  as needed. - Consider steroid injection if symptoms persist.  She will reconsider this in the future should symptoms worsen. - Refer to pedorthist for custom shoe inserts.  I like to do a neuroma pad for the third interspace as well as reverse Morton's extension for the right foot. - Discuss surgical options if conservative measures fail.  Should you proceed with surgery order advanced imaging prior.    Return for orthotics with Kerney Pee .   Charity Conch DPM

## 2023-09-19 DIAGNOSIS — M533 Sacrococcygeal disorders, not elsewhere classified: Secondary | ICD-10-CM | POA: Insufficient documentation

## 2023-10-10 ENCOUNTER — Other Ambulatory Visit: Payer: Self-pay

## 2023-10-10 ENCOUNTER — Other Ambulatory Visit: Payer: Self-pay | Admitting: "Endocrinology

## 2023-10-10 DIAGNOSIS — E890 Postprocedural hypothyroidism: Secondary | ICD-10-CM

## 2023-10-15 ENCOUNTER — Other Ambulatory Visit

## 2023-10-15 LAB — TSH+FREE T4: TSH W/REFLEX TO FT4: 2.81 m[IU]/L (ref 0.40–4.50)

## 2023-10-19 ENCOUNTER — Ambulatory Visit (INDEPENDENT_AMBULATORY_CARE_PROVIDER_SITE_OTHER): Admitting: "Endocrinology

## 2023-10-19 ENCOUNTER — Encounter: Payer: Self-pay | Admitting: "Endocrinology

## 2023-10-19 VITALS — BP 100/80 | HR 85 | Ht 67.25 in | Wt 157.0 lb

## 2023-10-19 DIAGNOSIS — E890 Postprocedural hypothyroidism: Secondary | ICD-10-CM | POA: Diagnosis not present

## 2023-10-19 DIAGNOSIS — E559 Vitamin D deficiency, unspecified: Secondary | ICD-10-CM | POA: Diagnosis not present

## 2023-10-19 MED ORDER — LEVOTHYROXINE SODIUM 100 MCG PO TABS: 100.0000 ug | ORAL_TABLET | Freq: Every day | ORAL | 1 refills | Status: DC

## 2023-10-19 NOTE — Progress Notes (Signed)
 Outpatient Endocrinology Note [name] Newcomer, MD  10/19/23   [name] [name] 1960/09/10 [id number]  Referring Provider: Mariel Shope, DO Primary Care Provider: Mariel Shope, DO Subjective  No chief complaint on file.   Assessment & Plan  Diagnoses and all orders for this visit:  Postoperative hypothyroidism -     TSH + free T4 -     levothyroxine  (SYNTHROID ) 100 MCG tablet; Take 1 tablet (100 mcg total) by mouth daily before breakfast.  Vitamin D  deficiency    [name] [name] is currently on 100 mcg levothyroxine  qam, takes appropriately.  S/p + intermittent hoarseness and worsening dry cough for a year, with other complaints Saw an ENT, on acid reflux medication Had left thyroidectomy in 2003 at 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 Has history of stage 3 breast cancer in 2015 s/p surgery, chemo and radiation 12/05/2022 thyroid  ultrasound revealed multinodular right thyroid  with 1.6 x 1.5 x 1.3 cm superior right TR 4 thyroid  nodule status post FNA on 12/28/2022 showing atypia of undetermined significance, Bethesda category III status post Afirma showing 75% suspicion for malignancy 05/10/23 pathology records: Pathology excision of left thyroid  nodule: Benign Discussed with patient at length the pros and cons of thyroidectomy, need for lifelong thyroid  hormone replacement  05/12/02 pathology records: Pathology excision of left thyroid  nodule: Benign S/p Right thyroid  lobectomy and isthmusectomy (completion thyroidectomy) on  05/10/2023 by Oralee Billow, MD Final pathology is benign. Operative Calcium  normal at 8.9  Not taking calcium , and Vit D 2000 IU -  05/2023 last Ca low at 8, no albumin Start Calcium  carbonate 600mg  bid with food - inconsistent  On Vit D 2000 IU daily   I have reviewed current medications, nurse's notes, allergies, vital signs, past medical and surgical  history, family medical history, and social history for this encounter. Counseled patient on symptoms, examination findings, lab findings, imaging results, treatment decisions and monitoring and prognosis. The patient understood the recommendations and agrees with the treatment plan. All questions regarding treatment plan were fully answered.   Return in about 6 months (around 04/19/2024) for visit + labs before next visit.   [name] Newcomer, MD  10/19/23   I have reviewed current medications, nurse's notes, allergies, vital signs, past medical and surgical history, family medical history, and social history for this encounter. Counseled patient on symptoms, examination findings, lab findings, imaging results, treatment decisions and monitoring and prognosis. The patient understood the recommendations and agrees with the treatment plan. All questions regarding treatment plan were fully answered.   History of Present Illness [name] [name] is a 63 y.o. year old female who presents to our clinic with for postoperative hypothyroid s/p total thyroidectomy now and Vit D deficiency.  No peri-oral/peri-ungual numbness/tingling/seizures No dysphagia/dysphonia/dyspnea No: constipation/hyperdefecation, no weight loss/gain, no heat/cold intolerance, palpitations  Reports fatigue  05/12/02 pathology records: Pathology excision of left thyroid  nodule: Benign S/p Right thyroid  lobectomy and isthmusectomy (completion thyroidectomy) on  05/10/2023 by Oralee Billow, MD Final pathology is benign. Operative Calcium  normal at 8.9  Had left thyroidectomy in 2003 at [facility] kidney for a recurrent thyroid  cyst  Has history of stage 3 breast cancer in 2015 s/p surgery, chemo and radiation   12/05/22 CLINICAL DATA:  63 year old female with enlarged thyroid  with voice changes   EXAM: THYROID  ULTRASOUND   TECHNIQUE: Ultrasound examination of the thyroid  gland and adjacent soft tissues was  performed.  COMPARISON:  08/11/2015   FINDINGS: Parenchymal Echotexture: Moderately heterogenous   Isthmus: 0.3 cm   Right lobe: 6.7 cm x 2.4 cm x 2.1 cm   Left lobe: Prior left hemithyroidectomy   _________________________________________________________   Estimated total number of nodules >/= 1 cm: 1   Number of spongiform nodules >/=  2 cm not described below (TR1): 0   Number of mixed cystic and solid nodules >/= 1.5 cm not described below (TR2): 0   _________________________________________________________   Nodule labeled 1 superior right thyroid , new from the comparison, 1.6 cm x 1.5 cm x 1.3 cm. Nodule has TR 4 characteristics and meets criteria for biopsy.   The nodules labeled 2, 3, 4 are all less than 10 mm, none with suspicious/high risk features. None meet criteria for surveillance.   No adenopathy   IMPRESSION: New right superior thyroid  nodule (labeled 1, 1.6 cm, TR 4) meets criteria for biopsy, as designated by the newly established ACR TI-RADS criteria, and referral for biopsy is recommended.   Left hemithyroidectomy.  12/28/22 nical History: Nodule labeled 1 superior right thyroid , new from the  comparison, 1.6cm x 1.5cm x 1.3cm. Nodule has TR4 characteristics and  meets criteria for biopsy  Specimen Submitted:  A. THYROID , RT SUPERIOR NODULE#1, FINE NEEDLE  ASPIRATION    FINAL MICROSCOPIC DIAGNOSIS:  - Atypia of undetermined significance (Bethesda category III)      Physical Exam  BP 100/80   Pulse 85   Ht 5' 7.25 (1.708 m)   Wt 157 lb (71.2 kg)   SpO2 98%   BMI 24.41 kg/m  Constitutional: well developed, well nourished Head: normocephalic, atraumatic, no exophthalmos Eyes: sclera anicteric, no redness Neck: + thyroidectomy scar, no thyroid  tenderness Lungs: normal respiratory effort Neurology: alert and oriented Skin: dry, no appreciable rashes Musculoskeletal: no appreciable defects Psychiatric: normal mood and  affect  Allergies No Known Allergies  Current Medications Patient's Medications  New Prescriptions   No medications on file  Previous Medications   ASCORBIC ACID (VITAMIN C) 1000 MG TABLET    Take 1,000 mg by mouth daily.   CALCIUM  CARBONATE (TUMS) 500 MG CHEWABLE TABLET    Chew 2 tablets (400 mg of elemental calcium  total) by mouth 3 (three) times daily.   CELEBREX  100 MG CAPSULE    Take 1 capsule twice a day by oral route for 30 days.   CHOLECALCIFEROL (VITAMIN D3) 125 MCG (5000 UT) TABS    Take by mouth daily with supper.   FAMOTIDINE  (PEPCID ) 20 MG TABLET    Take 20 mg by mouth daily as needed for heartburn or indigestion.   IBUPROFEN  (ADVIL ) 200 MG TABLET    Take 200 mg by mouth every 8 (eight) hours as needed (pain/headaches.).   KRILL OIL PO    Take 500 mg by mouth daily with supper.   MAGNESIUM GLYCINATE PO    Take 1 capsule by mouth 4 (four) times a week. At bedtime.   MENAQUINONE-7 (K2 PO)    Take 1 capsule by mouth daily with supper. MK-7 Vitamin K-2   OMEPRAZOLE  (PRILOSEC) 20 MG CAPSULE    TAKE ONE CAPSULE BY MOUTH DAILY   OVER THE COUNTER MEDICATION    Take 1 capsule by mouth daily with supper.   VITAMINS A & D (VITAMIN A & D) 10000-400 UNITS TABS    Take 1 tablet by mouth daily with supper. Now Ultra A & D-3  Modified Medications   Modified Medication Previous Medication   LEVOTHYROXINE  (SYNTHROID )  100 MCG TABLET levothyroxine  (SYNTHROID ) 100 MCG tablet      Take 1 tablet (100 mcg total) by mouth daily before breakfast.    Take 1 tablet (100 mcg total) by mouth daily before breakfast.  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 No problems since-pt states was caring for ailing father, anxiety   Personal history of radiation therapy    left breast 2015   Psychophysiological insomnia 04/15/2020   Skin lesion 08/10/2015   Right forearm and left shoulder  [place] dermatology note 08/2015: Dr. Raynaldo Call, inflamed seborrheic keratosis left posterior shoulder, no treatment. Actinic keratosis on right arm with cryotherapy. Benign scattered seborrheic keratosis.   Thyroid  disease    Tick bite of right thigh 2016   Use of tamoxifen  (Nolvadex ) 08/10/2015   Started: 03/2014    Wears glasses     Past Surgical History Past Surgical History:  Procedure Laterality Date   ABDOMINAL HYSTERECTOMY  02/07/2011   Procedure: HYSTERECTOMY ABDOMINAL;  Surgeon: Ashby Lawman;  Location: [facility];  Service: Gynecology;  Laterality: N/A;   BREAST EXCISIONAL BIOPSY Right 10/07/2014   high risk   BREAST EXCISIONAL BIOPSY Left 12/24/2015   benign   BREAST LUMPECTOMY Left 09/09/2013   left breast 2015   BREAST LUMPECTOMY WITH NEEDLE LOCALIZATION AND AXILLARY LYMPH NODE DISSECTION Left 09/09/2013   Procedure: LEFT BREAST LUMPECTOMY WITH NEEDLE LOCALIZATION AND LEFT AXILLARY LYMPH NODE DISSECTION;  Surgeon: Quitman Bucy, MD;  Location: [facility];  Service: General;  Laterality: Left;   BREAST LUMPECTOMY WITH RADIOACTIVE SEED LOCALIZATION Right 10/07/2014   Procedure: RIGHT BREAST LUMPECTOMY WITH RADIOACTIVE SEED LOCALIZATION;  Surgeon: Ayesha Lente, MD;  Location: [facility];  Service: General;  Laterality: Right;   BREAST LUMPECTOMY WITH  RADIOACTIVE SEED LOCALIZATION Left 12/24/2015   Procedure: LEFT BREAST LUMPECTOMY WITH RADIOACTIVE SEED LOCALIZATION;  Surgeon: Ayesha Lente, MD;  Location: [facility];  Service: General;  Laterality: Left;   CESAREAN SECTION     DILATION AND CURETTAGE OF UTERUS     ENDOMETRIAL ABLATION  2006   LAPAROSCOPIC ASSISTED VAGINAL HYSTERECTOMY  02/07/2011   Procedure: LAPAROSCOPIC ASSISTED VAGINAL HYSTERECTOMY;  Surgeon: Ashby Lawman;  Location: [facility];  Service: Gynecology;  Laterality: N/A;   PORTACATH PLACEMENT Right 09/29/2013   Procedure: INSERTION PORT-A-CATH;  Surgeon: Quitman Bucy, MD;  Location: [facility];  Service: General;  Laterality: Right;   THYROIDECTOMY, PARTIAL Left 2001    Family History family history includes Angina in her father; Aortic aneurysm in her father; Arrhythmia in her maternal aunt, maternal aunt and another family member; Breast cancer in her maternal aunt, maternal aunt, and mother; Breast cancer (age of onset: 23) in her maternal aunt; CAD in her  father; Cancer in her paternal uncle; Colon cancer in her maternal grandmother; Dementia in her father; Hypertension in an other family member; Non-Hodgkin's lymphoma in her mother; Rectal cancer in her paternal grandmother; Thyroid  disease in her maternal aunt, maternal grandmother, and maternal uncle.  Social History Social History   Socioeconomic History   Marital status: Married    Spouse name: Not on file   Number of children: Not on file   Years of education: Not on file   Highest education level: Some college, no degree  Occupational History   Occupation: Register of deeds    Comment: Full time  Tobacco Use   Smoking status: Never    Passive exposure: Never   Smokeless tobacco: Never  Vaping Use   Vaping status: Never Used  Substance and Sexual Activity   Alcohol use: Yes    Alcohol/week: 2.0 standard drinks of alcohol    Types: 2 Glasses of wine per week     Comment: occas.   Drug use: Not Currently    Comment: smoked marijuana last month   Sexual activity: Yes    Birth control/protection: Surgical  Other Topics Concern   Not on file  Social History Narrative   Married, Myrtie Atkinson. 2 children Polly Brink and Esko)   HS graduation; [employer] employee (desk job)   Nonsmoker, 3-5 drinks weekly of etoh,  Denies drugs.    Drinks caffeine beverages   Wears seatbelt, smoke detector in the home   Firearms in the home   Exercises >3 x week.    Feels safe in relationships.    Social Drivers of Corporate investment banker Strain: Low Risk  (12/01/2022)   Overall Financial Resource Strain (CARDIA)    Difficulty of Paying Living Expenses: Not hard at all  Food Insecurity: No Food Insecurity (05/10/2023)   Hunger Vital Sign    Worried About Running Out of Food in the Last Year: Never true    Ran Out of Food in the Last Year: Never true  Transportation Needs: No Transportation Needs (05/10/2023)   PRAPARE - Administrator, Civil Service (Medical): No    Lack of Transportation (Non-Medical): No  Physical Activity: Insufficiently Active (12/01/2022)   Exercise Vital Sign    Days of Exercise per Week: 3 days    Minutes of Exercise per Session: 30 min  Stress: No Stress Concern Present (12/01/2022)   [employer] of Occupational Health - Occupational Stress Questionnaire    Feeling of Stress : Not at all  Social Connections: Moderately Integrated (12/01/2022)   Social Connection and Isolation Panel    Frequency of Communication with Friends and Family: More than three times a week    Frequency of Social Gatherings with Friends and Family: Once a week    Attends Religious Services: Never    Database administrator or Organizations: Yes    Attends Engineer, structural: More than 4 times per year    Marital Status: Married  Catering manager Violence: Not At Risk (05/10/2023)   Humiliation, Afraid, Rape, and Kick questionnaire    Fear of  Current or Ex-Partner: No    Emotionally Abused: No    Physically Abused: No    Sexually Abused: No    Laboratory Investigations Lab Results  Component Value Date   TSH 2.22 01/18/2023   TSH 1.65 12/01/2022   TSH 1.92 08/19/2021   FREET4 1.04 01/18/2023   FREET4 0.96 12/01/2022   FREET4 0.85 01/30/2019  No results found for: TSI   No components found for: TRAB   Lab Results  Component Value Date   CHOL 218 (H) 01/26/2023   Lab Results  Component Value Date   HDL 44.50 01/26/2023   Lab Results  Component Value Date   LDLCALC 132 (H) 01/26/2023   Lab Results  Component Value Date   TRIG 209.0 (H) 01/26/2023   Lab Results  Component Value Date   CHOLHDL 5 01/26/2023   Lab Results  Component Value Date   CREATININE 0.93 07/19/2023   Lab Results  Component Value Date   GFR 78.10 01/26/2023      Component Value Date/Time   NA 138 07/19/2023 0949   NA 139 01/12/2020 0000   NA 142 10/26/2016 1141   K 4.9 07/19/2023 0949   K 3.8 10/26/2016 1141   CL 104 07/19/2023 0949   CO2 27 07/19/2023 0949   CO2 24 10/26/2016 1141   GLUCOSE 96 07/19/2023 0949   GLUCOSE 122 10/26/2016 1141   BUN 16 07/19/2023 0949   BUN 15 01/12/2020 0000   BUN 13.2 10/26/2016 1141   CREATININE 0.93 07/19/2023 0949   CREATININE 0.9 10/26/2016 1141   CALCIUM  9.0 07/19/2023 0949   CALCIUM  9.1 10/26/2016 1141   PROT 6.9 01/26/2023 0934   PROT 6.8 10/26/2016 1141   ALBUMIN 4.3 01/26/2023 0934   ALBUMIN 3.7 10/26/2016 1141   AST 15 01/26/2023 0934   AST 22 02/19/2019 1148   AST 20 10/26/2016 1141   ALT 19 01/26/2023 0934   ALT 26 02/19/2019 1148   ALT 26 10/26/2016 1141   ALKPHOS 86 01/26/2023 0934   ALKPHOS 72 10/26/2016 1141   BILITOT 0.4 01/26/2023 0934   BILITOT 0.4 02/19/2019 1148   BILITOT 0.28 10/26/2016 1141   GFRNONAA >60 05/04/2023 1423   GFRNONAA >60 02/19/2019 1148   GFRAA 74 01/12/2020 0000   GFRAA >60 02/19/2019 1148      Latest Ref Rng & Units 07/19/2023     9:49 AM 05/11/2023    4:56 AM 05/04/2023    2:23 PM  BMP  Glucose 65 - 99 mg/dL 96   295   BUN 7 - 25 mg/dL 16   22   Creatinine 6.21 - 1.05 mg/dL 3.08   6.57   BUN/Creat Ratio 6 - 22 (calc) SEE NOTE:     Sodium 135 - 146 mmol/L 138   138   Potassium 3.5 - 5.3 mmol/L 4.9   4.2   Chloride 98 - 110 mmol/L 104   105   CO2 20 - 32 mmol/L 27   24   Calcium  8.6 - 10.4 mg/dL 9.0  8.0  8.8        Component Value Date/Time   WBC 6.0 05/04/2023 1423   RBC 3.66 (L) 05/04/2023 1423   HGB 11.5 (L) 05/04/2023 1423   HGB 11.6 (L) 02/19/2019 1148   HGB 13.1 10/26/2016 1141   HCT 34.6 (L) 05/04/2023 1423   HCT 38.2 10/26/2016 1141   PLT 303 05/04/2023 1423   PLT 211 02/19/2019 1148   PLT 223 10/26/2016 1141   MCV 94.5 05/04/2023 1423   MCV 93.2 10/26/2016 1141   MCH 31.4 05/04/2023 1423   MCHC 33.2 05/04/2023 1423   RDW 13.5 05/04/2023 1423   RDW 12.6 10/26/2016 1141   LYMPHSABS 1.8 01/26/2023 0934   LYMPHSABS 1.4 10/26/2016 1141   MONOABS 0.4 01/26/2023 0934   MONOABS 0.4 10/26/2016 1141   EOSABS 0.4 01/26/2023  0934   EOSABS 0.2 10/26/2016 1141   BASOSABS 0.0 01/26/2023 0934   BASOSABS 0.0 10/26/2016 1141      Parts of this note may have been dictated using voice recognition software. There may be variances in spelling and vocabulary which are unintentional. Not all errors are proofread. Please notify the [facility] if any discrepancies are noted or if the meaning of any statement is not clear.

## 2023-10-22 ENCOUNTER — Ambulatory Visit

## 2023-10-22 NOTE — Progress Notes (Signed)
 Orthotics   Patient was present and evaluated for Custom molded foot orthotics. Patient will benefit from CFO's to provide total contact to BIL MLA's helping to balance and distribute body weight more evenly across BIL feet helping to reduce plantar pressure and pain. Orthotic will also encourage FF / RF alignment  Patient was scanned today and will return for fitting upon receipt

## 2023-11-06 ENCOUNTER — Telehealth: Payer: Self-pay | Admitting: Podiatry

## 2023-11-06 NOTE — Telephone Encounter (Signed)
 [facility] orthotics are here and to confirm PUO appt

## 2023-11-26 ENCOUNTER — Ambulatory Visit (INDEPENDENT_AMBULATORY_CARE_PROVIDER_SITE_OTHER)

## 2023-11-26 DIAGNOSIS — G5762 Lesion of plantar nerve, left lower limb: Secondary | ICD-10-CM | POA: Diagnosis not present

## 2023-11-26 DIAGNOSIS — M2141 Flat foot [pes planus] (acquired), right foot: Secondary | ICD-10-CM

## 2023-11-26 DIAGNOSIS — M2142 Flat foot [pes planus] (acquired), left foot: Secondary | ICD-10-CM

## 2023-11-26 DIAGNOSIS — M7751 Other enthesopathy of right foot: Secondary | ICD-10-CM | POA: Diagnosis not present

## 2023-11-26 DIAGNOSIS — M205X1 Other deformities of toe(s) (acquired), right foot: Secondary | ICD-10-CM | POA: Diagnosis not present

## 2023-11-26 NOTE — Progress Notes (Signed)
 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   Orthotics were dispensed and fit was satisfactory. Reviewed instructions for break-in and wear. Written instructions given to patient.  Patient will follow up as needed.   Lolita Schultze Cped, CFo, CFm

## 2023-12-18 ENCOUNTER — Inpatient Hospital Stay
Admission: RE | Admit: 2023-12-18 | Discharge: 2023-12-18 | Discharge status: Home / Self Care | Source: Ambulatory Visit | Attending: Family Medicine | Admitting: Family Medicine

## 2023-12-18 DIAGNOSIS — Z1231 Encounter for screening mammogram for malignant neoplasm of breast: Secondary | ICD-10-CM

## 2023-12-21 ENCOUNTER — Ambulatory Visit: Payer: Self-pay | Admitting: Family Medicine

## 2024-01-28 ENCOUNTER — Encounter: Payer: Self-pay | Admitting: Family Medicine

## 2024-01-28 ENCOUNTER — Ambulatory Visit (INDEPENDENT_AMBULATORY_CARE_PROVIDER_SITE_OTHER): Payer: Commercial Managed Care - PPO | Admitting: Family Medicine

## 2024-01-28 VITALS — BP 114/82 | HR 80 | Temp 98.2°F | Ht 67.5 in | Wt 151.2 lb

## 2024-01-28 DIAGNOSIS — D440 Neoplasm of uncertain behavior of thyroid gland: Secondary | ICD-10-CM

## 2024-01-28 DIAGNOSIS — Z131 Encounter for screening for diabetes mellitus: Secondary | ICD-10-CM | POA: Diagnosis not present

## 2024-01-28 DIAGNOSIS — Z0000 Encounter for general adult medical examination without abnormal findings: Secondary | ICD-10-CM

## 2024-01-28 DIAGNOSIS — E890 Postprocedural hypothyroidism: Secondary | ICD-10-CM | POA: Diagnosis not present

## 2024-01-28 DIAGNOSIS — Z8249 Family history of ischemic heart disease and other diseases of the circulatory system: Secondary | ICD-10-CM

## 2024-01-28 DIAGNOSIS — E042 Nontoxic multinodular goiter: Secondary | ICD-10-CM

## 2024-01-28 DIAGNOSIS — Z13220 Encounter for screening for lipoid disorders: Secondary | ICD-10-CM | POA: Diagnosis not present

## 2024-01-28 DIAGNOSIS — Z170 Estrogen receptor positive status [ER+]: Secondary | ICD-10-CM

## 2024-01-28 DIAGNOSIS — C50212 Malignant neoplasm of upper-inner quadrant of left female breast: Secondary | ICD-10-CM

## 2024-01-28 DIAGNOSIS — E559 Vitamin D deficiency, unspecified: Secondary | ICD-10-CM

## 2024-01-28 DIAGNOSIS — Z23 Encounter for immunization: Secondary | ICD-10-CM | POA: Diagnosis not present

## 2024-01-28 DIAGNOSIS — M2550 Pain in unspecified joint: Secondary | ICD-10-CM | POA: Diagnosis not present

## 2024-01-28 LAB — CBC
HCT: 29.5 % — ABNORMAL LOW (ref 36.0–46.0)
Hemoglobin: 9.9 g/dL — ABNORMAL LOW (ref 12.0–15.0)
MCHC: 33.6 g/dL (ref 30.0–36.0)
MCV: 89.4 fl (ref 78.0–100.0)
Platelets: 318 K/uL (ref 150.0–400.0)
RBC: 3.3 Mil/uL — ABNORMAL LOW (ref 3.87–5.11)
RDW: 16.9 % — ABNORMAL HIGH (ref 11.5–15.5)
WBC: 5.1 K/uL (ref 4.0–10.5)

## 2024-01-28 LAB — COMPREHENSIVE METABOLIC PANEL WITH GFR
ALT: 26 U/L (ref 0–35)
AST: 37 U/L (ref 0–37)
Albumin: 4 g/dL (ref 3.5–5.2)
Alkaline Phosphatase: 130 U/L — ABNORMAL HIGH (ref 39–117)
BUN: 14 mg/dL (ref 6–23)
CO2: 27 meq/L (ref 19–32)
Calcium: 9.3 mg/dL (ref 8.4–10.5)
Chloride: 101 meq/L (ref 96–112)
Creatinine, Ser: 0.9 mg/dL (ref 0.40–1.20)
GFR: 67.33 mL/min (ref >60.00)
Glucose, Bld: 95 mg/dL (ref 70–99)
Potassium: 4.3 meq/L (ref 3.5–5.1)
Sodium: 138 meq/L (ref 135–145)
Total Bilirubin: 0.4 mg/dL (ref 0.2–1.2)
Total Protein: 6.8 g/dL (ref 6.0–8.3)

## 2024-01-28 LAB — LIPID PANEL
Cholesterol: 158 mg/dL (ref 0–200)
HDL: 33 mg/dL — ABNORMAL LOW (ref >39.00)
LDL Cholesterol: 95 mg/dL (ref 0–99)
NonHDL: 125.34
Total CHOL/HDL Ratio: 5
Triglycerides: 151 mg/dL — ABNORMAL HIGH (ref 0.0–149.0)
VLDL: 30.2 mg/dL (ref 0.0–40.0)

## 2024-01-28 LAB — HEMOGLOBIN A1C: Hgb A1c MFr Bld: 6.3 % (ref 4.6–6.5)

## 2024-01-28 NOTE — Progress Notes (Signed)
 Patient ID: [name] [name], female  DOB: February 12, 1961, 63 y.o.   MRN: [id number] Patient Care Team    Relationship Specialty Notifications Start End  Catherine Charlies LABOR, DO PCP - General Family Medicine  07/16/15   Latisha Medford, MD Consulting Physician Obstetrics and Gynecology  08/10/15   Armbruster, Elspeth SQUIBB, MD Consulting Physician Gastroenterology  10/30/18   Dartha Ernst, MD Consulting Physician Endocrinology  01/28/24   Delane Lye, MD Referring Physician Orthopedic Surgery  01/28/24     Chief Complaint  Patient presents with   Annual Exam    Patient mentions having back pain and pain around the ribs  and is seeing an ortho but wanted to know if there is blood work that can be done. Did have have MRI done and will meet with Dr. Later this afternoon to go over results Prevnar 20. decline Influenza vaccine. Decline     Subjective: [name] [name] is a 63 y.o.  Female  present for CPE  All past medical history, surgical history, allergies, family history, immunizations, medications and social history were updated in the electronic medical record today. All recent labs, ED visits and hospitalizations within the last year were reviewed.  Health maintenance:  Colonoscopy: completed 2019. Dr. Leigh. 10 yr. Benign polyp x1 Mammogram 12/18/2023; yearly- [facility] (H/o Left Breast cancer 2015)>ordered by gyn Cervical cancer screening:Dr. Latisha, [facility], hysterectomy for fibroids. Immunizations: Declines Flu,  Tdap completed 2017. covid series completed. Shingles completed , prevnar 20 declined Infectious disease screening: HIV and Hep C completed  DEXA: completed at [facility] 03/2020.  Osteopenia. Taking Vit D and Ca use.Estrogen deficiency Assistive device: none Oxygen [id number] Patient has a Dental home. Hospitalizations/ED visits: Reviewed   Hypothyroidism postsurgical/thyroid  nodules Patient thyroidectomy May 10, 2023.  She would like to switch her disorder PCP.  She reports  compliance with levothyroxine  100 mcg daily  Patient reports she has also noticed migrating joint pain sometimes in her lower back sometimes left side of her ribs or thighs.  She orthopedic recently who obtained an MRI.  She has an appointment with them later today to discuss results. She denies any fevers, chills, headaches or rash.     01/28/2024    9:02 AM 01/26/2023    9:32 AM 12/01/2022   10:49 AM 08/19/2021    9:04 AM 03/09/2020   10:56 AM  Depression screen PHQ 2/9  Decreased Interest 0 0 0 0 0  Down, Depressed, Hopeless 0 0 0 0 0  PHQ - 2 Score 0 0 0 0 0       No data to display            11/12/2015    8:25 AM 11/11/2019    1:38 PM 12/01/2022   10:49 AM 01/26/2023    9:32 AM 01/28/2024    9:02 AM  Fall Risk  Falls in the past year? No   0 0 0  Was there an injury with Fall?   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 No problems since-pt states was caring for ailing father, anxiety   Personal history of radiation therapy    left breast 2015   Psychophysiological insomnia 04/15/2020   Skin lesion 08/10/2015   Right forearm and left shoulder  [place] dermatology note 08/2015: Dr. Forestine, inflamed seborrheic keratosis left posterior shoulder, no treatment. Actinic keratosis on right arm with cryotherapy. Benign scattered seborrheic keratosis.   Thyroid  disease    Tick bite of right thigh 2016   Use of tamoxifen  (Nolvadex ) 08/10/2015   Started: 03/2014    Wears glasses    No Known Allergies Past Surgical History:  Procedure Laterality Date   ABDOMINAL HYSTERECTOMY  02/07/2011   Procedure: HYSTERECTOMY ABDOMINAL;  Surgeon: Truman Corona;  Location: [facility];  Service: Gynecology;  Laterality: N/A;   BREAST EXCISIONAL BIOPSY Right 10/07/2014   high risk   BREAST EXCISIONAL BIOPSY Left 12/24/2015   benign   BREAST LUMPECTOMY Left 09/09/2013   left breast 2015   BREAST LUMPECTOMY WITH NEEDLE LOCALIZATION AND AXILLARY LYMPH NODE DISSECTION Left 09/09/2013   Procedure: LEFT BREAST LUMPECTOMY WITH NEEDLE LOCALIZATION AND LEFT AXILLARY LYMPH NODE DISSECTION;  Surgeon: Morene ONEIDA Olives, MD;  Location: [facility];  Service: General;  Laterality: Left;   BREAST LUMPECTOMY WITH RADIOACTIVE SEED LOCALIZATION Right 10/07/2014   Procedure: RIGHT BREAST LUMPECTOMY WITH RADIOACTIVE SEED LOCALIZATION;  Surgeon: Morene Olives, MD;  Location: [facility];  Service: General;  Laterality: Right;   BREAST LUMPECTOMY WITH RADIOACTIVE SEED LOCALIZATION Left 12/24/2015   Procedure: LEFT BREAST LUMPECTOMY WITH RADIOACTIVE SEED LOCALIZATION;  Surgeon: Morene Olives, MD;  Location: [facility];  Service: General;  Laterality: Left;   CESAREAN  SECTION     DILATION AND CURETTAGE OF UTERUS     ENDOMETRIAL ABLATION  2006   LAPAROSCOPIC ASSISTED VAGINAL HYSTERECTOMY  02/07/2011   Procedure: LAPAROSCOPIC ASSISTED VAGINAL HYSTERECTOMY;  Surgeon: Truman Corona;  Location: [facility];  Service: Gynecology;  Laterality: N/A;   PORTACATH PLACEMENT Right 09/29/2013   Procedure: INSERTION PORT-A-CATH;  Surgeon: Morene ONEIDA Olives, MD;  Location: [facility];  Service: General;  Laterality: Right;   THYROIDECTOMY, PARTIAL Left 2001   Family History  Problem Relation Age of Onset   Angina Father    Aortic aneurysm Father    Dementia Father    CAD Father    Hypertension Other    Arrhythmia Other        Uncle- a fib   Breast cancer Mother        dx 77s. Died at 31   Non-Hodgkin's lymphoma Mother    Arrhythmia Maternal Aunt        A fib  Breast cancer Maternal Aunt 56       currently 50   Thyroid  disease Maternal Aunt    Arrhythmia Maternal Aunt        A fib   Breast cancer Maternal Aunt        dx 64s; currently 68s   Colon cancer Maternal Grandmother        dx 72s; died at 19   Thyroid  disease Maternal Grandmother    Cancer Paternal Uncle        unknown primary in early 88s   Breast cancer Maternal Aunt        dx 40s; died in 63s   Thyroid  disease Maternal Uncle    Rectal cancer Paternal Grandmother    Stomach cancer Neg Hx    Esophageal cancer Neg Hx    Social History   Social History Narrative   Married, Alm. 2 children Bard and Redfield)   HS graduation; [employer] employee (desk job)   Nonsmoker, 3-5 drinks weekly of etoh,  Denies drugs.    Drinks caffeine beverages   Wears seatbelt, smoke detector in the home   Firearms in the home   Exercises >3 x week.    Feels safe in relationships.     Allergies as of 01/28/2024   No Known Allergies      Medication List        Accurate as of January 28, 2024 12:07 PM. If you have any questions, ask your nurse or doctor.          STOP taking  these medications    calcium  carbonate 500 MG chewable tablet Commonly known as: Tums Stopped by: Charlies Bellini   CeleBREX  100 MG capsule Generic drug: celecoxib  Stopped by: Charlies Bellini   famotidine  20 MG tablet Commonly known as: PEPCID  Stopped by: Charlies Bellini   ibuprofen  200 MG tablet Commonly known as: ADVIL  Stopped by: Charlies Bellini   KRILL OIL PO Stopped by: Charlies Bellini   omeprazole  20 MG capsule Commonly known as: PRILOSEC Stopped by: Charlies Bellini       TAKE these medications    K2 PO Take 1 capsule by mouth daily with supper. MK-7 Vitamin K-2   levothyroxine  100 MCG tablet Commonly known as: Synthroid  Take 1 tablet (100 mcg total) by mouth daily before breakfast.   MAGNESIUM GLYCINATE PO Take 1 capsule by mouth 4 (four) times a week. At bedtime.   OVER THE COUNTER MEDICATION Take 1 capsule by mouth daily with supper.   Vitamin A & D 10000-400 units Tabs Take 1 tablet by mouth daily with supper. Now Ultra A & D-3   vitamin C 1000 MG tablet Take 1,000 mg by mouth daily.   Vitamin D3 125 MCG (5000 UT) Tabs Take by mouth daily with supper.        All past medical history, surgical history, allergies, family history, immunizations andmedications were updated in the EMR today and reviewed under the history and medication portions of their EMR.       ROS 14 pt review of systems performed and negative (unless mentioned in an HPI)  Objective: BP 114/82 (BP Location: Left Arm, Patient Position: Sitting, Cuff Size: Normal)   Pulse 80   Temp 98.2 F (36.8 C) (Oral)   Ht 5' 7.5 (1.715 m)   Wt 151 lb 3.2 oz (68.6 kg)   SpO2 98%   BMI 23.33 kg/m  Physical Exam Vitals and nursing note reviewed.  Constitutional:      General:  She is not in acute distress.    Appearance: Normal appearance. She is not ill-appearing or toxic-appearing.  HENT:     Head: Normocephalic and atraumatic.     Right Ear: Tympanic membrane, ear canal and external ear  normal. There is no impacted cerumen.     Left Ear: Tympanic membrane, ear canal and external ear normal. There is no impacted cerumen.     Nose: No congestion or rhinorrhea.     Mouth/Throat:     Mouth: Mucous membranes are moist.     Pharynx: Oropharynx is clear. No oropharyngeal exudate or posterior oropharyngeal erythema.  Eyes:     General: No scleral icterus.       Right eye: No discharge.        Left eye: No discharge.     Extraocular Movements: Extraocular movements intact.     Conjunctiva/sclera: Conjunctivae normal.     Pupils: Pupils are equal, round, and reactive to light.  Cardiovascular:     Rate and Rhythm: Normal rate and regular rhythm.     Pulses: Normal pulses.     Heart sounds: Normal heart sounds. No murmur heard.    No friction rub. No gallop.  Pulmonary:     Effort: Pulmonary effort is normal. No respiratory distress.     Breath sounds: Normal breath sounds. No stridor. No wheezing, rhonchi or rales.  Chest:     Chest wall: No tenderness.  Abdominal:     General: Abdomen is flat. Bowel sounds are normal. There is no distension.     Palpations: Abdomen is soft. There is no mass.     Tenderness: There is no abdominal tenderness. There is no right CVA tenderness, left CVA tenderness, guarding or rebound.     Hernia: No hernia is present.  Musculoskeletal:        General: No swelling, tenderness or deformity. Normal range of motion.     Cervical back: Normal range of motion and neck supple. No rigidity or tenderness.     Right lower leg: No edema.     Left lower leg: No edema.  Lymphadenopathy:     Cervical: No cervical adenopathy.  Skin:    General: Skin is warm and dry.     Coloration: Skin is not jaundiced or pale.     Findings: No bruising, erythema, lesion or rash.  Neurological:     General: No focal deficit present.     Mental Status: She is alert and oriented to person, place, and time. Mental status is at baseline.     Cranial Nerves: No cranial  nerve deficit.     Sensory: No sensory deficit.     Motor: No weakness.     Coordination: Coordination normal.     Gait: Gait normal.     Deep Tendon Reflexes: Reflexes normal.  Psychiatric:        Mood and Affect: Mood normal.        Behavior: Behavior normal.        Thought Content: Thought content normal.        Judgment: Judgment normal.      No results found.  Assessment/plan: [name] [name] is a 63 y.o. female present for CPE  Osteopenia/vit d def - Vitamin D  (25 hydroxy) UTD 06/2023 - Continue supplement -DEXA completed by gynecology   Influenza vaccine needed Declined FH: heart disease/lipid screening - Lipid panel Need for vaccination for pneumococcus Declined Diabetes mellitus screening - Hemoglobin A1c  Multiple thyroid  nodules/Postoperative hypothyroidism  Continue levothyroxine  100 mcg daily.refills will be provided in appropriate dose based on lab result today - TSH - T4, free - PTH, Intact and Calcium   Malignant neoplasm of upper-inner quadrant of left breast in female, estrogen receptor positive (HCC) Oncology  Polyarthralgia We discussed today that her symptoms being migratory could indicate tickborne disease which she is concerned over versus parathyroid/calcium /vitamin D  disturbance after thyroidectomy. We also discussed arthritis versus relation to her breast cancer. - PTH, Intact and Calcium  - Vitamin D  (25 hydroxy) - B. burgdorfi antibodies If labs do not indicate cause and MRI through orthopedics does not reveal cause.  Would recommend she follow-up here and we start an arthritic workup and image her ribs.  Patient reports understanding.  Routine general medical examination at a health care facility Patient was encouraged to exercise greater than 150 minutes a week. Patient was encouraged to choose a diet filled with fresh fruits and vegetables, and lean meats. AVS provided to patient today for education/recommendation on gender specific health  and safety maintenance. Colonoscopy: completed 2019. Dr. Leigh. 10 yr. Benign polyp x1 Mammogram 12/18/2023; yearly- [facility] (H/o Left Breast cancer 2015)>ordered by gyn Cervical cancer screening:Dr. Latisha, [facility], hysterectomy for fibroids. Immunizations: Declines Flu,  Tdap completed 2017. covid series completed. Shingles completed , prevnar 20 declined Infectious disease screening: HIV and Hep C completed  DEXA: completed at [facility] 03/2020.  Osteopenia. Taking Vit D and Ca use.Estrogen deficiency  Return in about 1 year (around 01/28/2025) for cpe (20 min).  Orders Placed This Encounter  Procedures   CBC   Comprehensive metabolic panel with GFR   Hemoglobin A1c   Lipid panel   TSH   T4, free   PTH, Intact and Calcium    Vitamin D  (25 hydroxy)   B. burgdorfi antibodies    No orders of the defined types were placed in this encounter.  Referral Orders  No referral(s) requested today     Electronically signed by: Charlies Bellini, DO [facility]

## 2024-01-28 NOTE — Patient Instructions (Addendum)

## 2024-01-29 ENCOUNTER — Ambulatory Visit: Payer: Self-pay | Admitting: Family Medicine

## 2024-01-29 DIAGNOSIS — E890 Postprocedural hypothyroidism: Secondary | ICD-10-CM

## 2024-01-29 LAB — B. BURGDORFI ANTIBODIES: B burgdorferi Ab IgG+IgM: <0.9 {index}

## 2024-01-29 LAB — PTH, INTACT AND CALCIUM
Calcium: 9 mg/dL (ref 8.6–10.4)
PTH: 10 pg/mL — ABNORMAL LOW (ref 16–77)

## 2024-01-29 LAB — VITAMIN D 25 HYDROXY (VIT D DEFICIENCY, FRACTURES): VITD: 42.91 ng/mL (ref 30.00–100.00)

## 2024-01-29 LAB — TSH: TSH: 2.29 u[IU]/mL (ref 0.35–5.50)

## 2024-01-29 LAB — T4, FREE: Free T4: 1.3 ng/dL (ref 0.60–1.60)

## 2024-01-29 MED ORDER — LEVOTHYROXINE SODIUM 100 MCG PO TABS: 100.0000 ug | ORAL_TABLET | Freq: Every day | ORAL | 3 refills | Status: AC

## 2024-01-29 NOTE — Telephone Encounter (Signed)
 Please call patient Kidney function and electrolytes are normal. Cholesterol panel is at goal. Thyroid  function is normal and I refilled her thyroid  medication.  A1c increased to 6.3 in the prediabetic range. Parathyroid hormone is low and her calcium  is low normal.  This may be the reason why she is having more muscle spasms.  Recommend she take a calcium  supplement of 1000-1200 mg a day  She is also anemic with a hemoglobin of 9.9.  We need to get her in for a follow-up within the next week to discuss the abnormal labs above and perform additional labs to find out why she is now anemic.

## 2024-01-31 ENCOUNTER — Telehealth: Payer: Self-pay

## 2024-01-31 NOTE — Telephone Encounter (Signed)
 FYI  Appointment canceled for [name] [name] ([id number]) Visit type: OFFICE VISIT 02/06/2024 8:40 AM (20 minutes) with Charlies Bellini in [facility]   Reason for cancellation: Canceled via MyChart   Patient comments: Recently diagnosed with MetsBC, I have an appt with Dr. Odean (Oncologist at [facility]) on 10/9. After that, if I feel I should meet with Dr. Bellini I will reschedule.

## 2024-01-31 NOTE — Telephone Encounter (Signed)
 Patient comments: Request for medication to help mentally with recent diagnosis   Patient has scheduled appt via mychart with Dr. Catherine 10/3. -Darya Bigler

## 2024-02-01 ENCOUNTER — Ambulatory Visit: Admitting: Family Medicine

## 2024-02-06 ENCOUNTER — Ambulatory Visit: Admitting: Family Medicine

## 2024-02-06 ENCOUNTER — Inpatient Hospital Stay
Admission: RE | Admit: 2024-02-06 | Discharge: 2024-02-06 | Disposition: A | Payer: Self-pay | Source: Ambulatory Visit | Attending: Hematology and Oncology

## 2024-02-06 ENCOUNTER — Other Ambulatory Visit: Payer: Self-pay | Admitting: *Deleted

## 2024-02-06 DIAGNOSIS — C50212 Malignant neoplasm of upper-inner quadrant of left female breast: Secondary | ICD-10-CM

## 2024-02-07 ENCOUNTER — Inpatient Hospital Stay: Attending: Hematology and Oncology | Admitting: Hematology and Oncology

## 2024-02-07 ENCOUNTER — Inpatient Hospital Stay

## 2024-02-07 VITALS — BP 138/84 | HR 78 | Temp 97.6°F | Resp 18 | Ht 67.5 in | Wt 149.6 lb

## 2024-02-07 DIAGNOSIS — C50212 Malignant neoplasm of upper-inner quadrant of left female breast: Secondary | ICD-10-CM | POA: Insufficient documentation

## 2024-02-07 DIAGNOSIS — D630 Anemia in neoplastic disease: Secondary | ICD-10-CM | POA: Insufficient documentation

## 2024-02-07 DIAGNOSIS — D638 Anemia in other chronic diseases classified elsewhere: Secondary | ICD-10-CM

## 2024-02-07 DIAGNOSIS — Z170 Estrogen receptor positive status [ER+]: Secondary | ICD-10-CM | POA: Insufficient documentation

## 2024-02-07 DIAGNOSIS — C7951 Secondary malignant neoplasm of bone: Secondary | ICD-10-CM | POA: Insufficient documentation

## 2024-02-07 DIAGNOSIS — Z79811 Long term (current) use of aromatase inhibitors: Secondary | ICD-10-CM | POA: Insufficient documentation

## 2024-02-07 MED ORDER — ANASTROZOLE 1 MG PO TABS: 1.0000 mg | ORAL_TABLET | Freq: Every day | ORAL | 3 refills | Status: AC

## 2024-02-07 NOTE — Assessment & Plan Note (Signed)
 01/18/2014: ILC ER 70% PR 70% Ki67 7% HER2 negative (patient was seen by Dr. 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  Left sacral ala fracture, moderate to severe facet arthropathy L1-2 L5 and S1  Treatment plan: PET CT scan Consider biopsy of one of the lesions Based upon prognostic panel consider treatment options. Blood work today for [employer] antiestrogen therapy with anastrozole with the consideration for addition of Verzinio if she is truly estrogen receptor positive.  Return to clinic after the PET scan to discuss report

## 2024-02-07 NOTE — Progress Notes (Signed)
  [facility] CONSULT NOTE  Patient Care Team: Catherine Charlies LABOR, DO as PCP - General (Family Medicine) Latisha Medford, MD as Consulting Physician (Obstetrics and Gynecology) Leigh, Elspeth SQUIBB, MD as Consulting Physician (Gastroenterology) Dartha Ernst, MD as Consulting Physician (Endocrinology) Delane Lye, MD as Referring Physician (Orthopedic Surgery)  CHIEF COMPLAINTS/PURPOSE OF CONSULTATION:  Bone metastases  HISTORY OF PRESENTING ILLNESS:    History of Present Illness [name] [name] is a 63 year old female with lobular breast cancer who presents with back pain and weight loss. She is accompanied by her husband, Alm. She was previously under the care of Dr. Reid, who has retired.  In 2015, she was diagnosed with lobular breast cancer and underwent surgery, which revealed eight lymph nodes under her arm. She received chemotherapy, radiation, and five years of tamoxifen  therapy, which concluded approximately five years ago.  She began experiencing persistent back pain in April, initially thought to be a pulled muscle. The pain has moved around, affecting her rib area and causing difficulty with deep breaths and sneezing. Recently, the pain is more localized, with severe spasms under her shoulder blade that improve with ice and massage.  She has experienced weight loss of about six to eight pounds recently, despite having a decreased appetite.  Her recent MRI showed spots in the bones. A mammogram and Pap smear earlier this year were normal, and a chest x-ray in January was clear. Blood work showed mild anemia without iron deficiency. Her blood calcium  level is normal, and she has not noticed any bleeding or bruising.     I reviewed her records extensively and collaborated the history with the patient.    MEDICAL HISTORY:  Past Medical History:  Diagnosis Date   Anemia in neoplastic disease 02/06/2014   Anxiety disorder    Mild: treated by her  gynecologist with selective serotonin reuptake inhibitor   Arthritis    hands and feet   Breast cancer (HCC) 09/09/2013   Family history of malignant neoplasm of breast    Fibroid uterus    hysterctomy   GERD (gastroesophageal reflux disease)    Hot flashes    Hyperlipidemia    Malignant neoplasm of breast (female), unspecified site 08/07/2013   left breast invasive mammary ca in situ   Nonspecific abnormal electrocardiogram (ECG) (EKG) 2009   Osteoarthritis    Palpitations 2009   Improved; Secondary to premature ventricular contractions. No problems since-pt states was caring for ailing father, anxiety   Personal history of radiation therapy    left breast 2015   Psychophysiological insomnia 04/15/2020   Skin lesion 08/10/2015   Right forearm and left shoulder  [facility] note 08/2015: Dr. Forestine, inflamed seborrheic keratosis left posterior shoulder, no treatment. Actinic keratosis on right arm with cryotherapy. Benign scattered seborrheic keratosis.   Thyroid  disease    Tick bite of right thigh 2016   Use of tamoxifen  (Nolvadex ) 08/10/2015   Started: 03/2014    Wears glasses     SURGICAL HISTORY: Past Surgical History:  Procedure Laterality Date   ABDOMINAL HYSTERECTOMY  02/07/2011   Procedure: HYSTERECTOMY ABDOMINAL;  Surgeon: Medford Latisha;  Location: [facility];  Service: Gynecology;  Laterality: N/A;   BREAST EXCISIONAL BIOPSY Right 10/07/2014   high risk   BREAST EXCISIONAL BIOPSY Left 12/24/2015   benign   BREAST LUMPECTOMY Left 09/09/2013   left breast 2015   BREAST LUMPECTOMY WITH NEEDLE LOCALIZATION AND AXILLARY LYMPH NODE DISSECTION Left 09/09/2013   Procedure: LEFT BREAST LUMPECTOMY WITH  NEEDLE LOCALIZATION AND LEFT AXILLARY LYMPH NODE DISSECTION;  Surgeon: Morene ONEIDA Olives, MD;  Location: [facility];  Service: General;  Laterality: Left;   BREAST LUMPECTOMY WITH RADIOACTIVE SEED LOCALIZATION Right 10/07/2014   Procedure: RIGHT BREAST  LUMPECTOMY WITH RADIOACTIVE SEED LOCALIZATION;  Surgeon: Morene Olives, MD;  Location: [facility];  Service: General;  Laterality: Right;   BREAST LUMPECTOMY WITH RADIOACTIVE SEED LOCALIZATION Left 12/24/2015   Procedure: LEFT BREAST LUMPECTOMY WITH RADIOACTIVE SEED LOCALIZATION;  Surgeon: Morene Olives, MD;  Location: [facility];  Service: General;  Laterality: Left;   CESAREAN SECTION     DILATION AND CURETTAGE OF UTERUS     ENDOMETRIAL ABLATION  2006   LAPAROSCOPIC ASSISTED VAGINAL HYSTERECTOMY  02/07/2011   Procedure: LAPAROSCOPIC ASSISTED VAGINAL HYSTERECTOMY;  Surgeon: Truman Corona;  Location: [facility];  Service: Gynecology;  Laterality: N/A;   PORTACATH PLACEMENT Right 09/29/2013   Procedure: INSERTION PORT-A-CATH;  Surgeon: Morene ONEIDA Olives, MD;  Location: [facility];  Service: General;  Laterality: Right;   THYROIDECTOMY, PARTIAL Left 2001    SOCIAL HISTORY: Social History   Socioeconomic History   Marital status: Married    Spouse name: Not on file   Number of children: Not on file   Years of education: Not on file   Highest education level: 12th grade  Occupational History   Occupation: Register of deeds    Comment: Full time  Tobacco Use   Smoking status: Never    Passive exposure: Never   Smokeless tobacco: Never  Vaping Use   Vaping status: Never Used  Substance and Sexual Activity   Alcohol use: Yes    Alcohol/week: 2.0 standard drinks of alcohol    Types: 2 Glasses of wine per week    Comment: occas.   Drug use: Not Currently    Comment: smoked marijuana last month   Sexual activity: Yes    Birth control/protection: Surgical  Other Topics Concern   Not on file  Social History Narrative   Married, Alm. 2 children Bard and Gillham)   HS graduation; [employer] employee (desk job)   Nonsmoker, 3-5 drinks weekly of etoh,  Denies drugs.    Drinks caffeine beverages   Wears seatbelt, smoke detector  in the home   Firearms in the home   Exercises >3 x week.    Feels safe in relationships.    Social Drivers of Corporate investment banker Strain: Low Risk  (01/25/2024)   Overall Financial Resource Strain (CARDIA)    Difficulty of Paying Living Expenses: Not hard at all  Food Insecurity: No Food Insecurity (02/07/2024)   Hunger Vital Sign    Worried About Running Out of Food in the Last Year: Never true    Ran Out of Food in the Last Year: Never true  Transportation Needs: No Transportation Needs (02/07/2024)   PRAPARE - Administrator, Civil Service (Medical): No    Lack of Transportation (Non-Medical): No  Physical Activity: Unknown (01/25/2024)   Exercise Vital Sign    Days of Exercise per Week: Patient declined    Minutes of Exercise per Session: Not on file  Stress: No Stress Concern Present (01/25/2024)   [employer] of Occupational Health - Occupational Stress Questionnaire    Feeling of Stress: Not at all  Social Connections: Socially Integrated (01/25/2024)   Social Connection and Isolation Panel    Frequency of Communication with Friends and Family: Twice a week  Frequency of Social Gatherings with Friends and Family: Once a week    Attends Religious Services: More than 4 times per year    Active Member of [employer] or Organizations: Yes    Attends Engineer, structural: More than 4 times per year    Marital Status: Married  Catering manager Violence: Not At Risk (02/07/2024)   Humiliation, Afraid, Rape, and Kick questionnaire    Fear of Current or Ex-Partner: No    Emotionally Abused: No    Physically Abused: No    Sexually Abused: No    FAMILY HISTORY: Family History  Problem Relation Age of Onset   Angina Father    Aortic aneurysm Father    Dementia Father    CAD Father    Hypertension Other    Arrhythmia Other        Uncle- a fib   Breast cancer Mother        dx 26s. Died at 23   Non-Hodgkin's lymphoma Mother    Arrhythmia Maternal  Aunt        A fib   Breast cancer Maternal Aunt 37       currently 47   Thyroid  disease Maternal Aunt    Arrhythmia Maternal Aunt        A fib   Breast cancer Maternal Aunt        dx 66s; currently 4s   Colon cancer Maternal Grandmother        dx 11s; died at 42   Thyroid  disease Maternal Grandmother    Cancer Paternal Uncle        unknown primary in early 12s   Breast cancer Maternal Aunt        dx 37s; died in 52s   Thyroid  disease Maternal Uncle    Rectal cancer Paternal Grandmother    Stomach cancer Neg Hx    Esophageal cancer Neg Hx     ALLERGIES:  has no known allergies.  MEDICATIONS:  Current Outpatient Medications  Medication Sig Dispense Refill   Ascorbic Acid (VITAMIN C) 1000 MG tablet Take 1,000 mg by mouth daily.     Cholecalciferol (VITAMIN D3) 125 MCG (5000 UT) TABS Take by mouth daily with supper.     HYDROcodone -acetaminophen  (NORCO/VICODIN) 5-325 MG tablet 1 tablet. 5 mg     levothyroxine  (SYNTHROID ) 100 MCG tablet Take 1 tablet (100 mcg total) by mouth daily before breakfast. 90 tablet 3   MAGNESIUM GLYCINATE PO Take 1 capsule by mouth 4 (four) times a week. At bedtime.     Menaquinone-7 (K2 PO) Take 1 capsule by mouth daily with supper. MK-7 Vitamin K-2     OVER THE COUNTER MEDICATION Take 1 capsule by mouth daily with supper.     Vitamins A & D (VITAMIN A & D) 10000-400 units TABS Take 1 tablet by mouth daily with supper. Now Ultra A & D-3     No current facility-administered medications for this visit.    REVIEW OF SYSTEMS:   Constitutional: Denies fevers, chills or abnormal night sweats Breast:  Denies any palpable lumps or discharge All other systems were reviewed with the patient and are negative.  PHYSICAL EXAMINATION: ECOG PERFORMANCE STATUS: 1 - Symptomatic but completely ambulatory  Vitals:   02/07/24 1546  BP: 138/84  Pulse: 78  Resp: 18  Temp: 97.6 F (36.4 C)  SpO2: 100%   Filed Weights   02/07/24 1546  Weight: 149 lb 9.6 oz  (67.9 kg)    GENERAL:alert, no  distress and comfortable    LABORATORY DATA:  I have reviewed the data as listed Lab Results  Component Value Date   WBC 5.1 01/28/2024   HGB 9.9 (L) 01/28/2024   HCT 29.5 (L) 01/28/2024   MCV 89.4 01/28/2024   PLT 318.0 01/28/2024   Lab Results  Component Value Date   NA 138 01/28/2024   K 4.3 01/28/2024   CL 101 01/28/2024   CO2 27 01/28/2024    RADIOGRAPHIC STUDIES: I have personally reviewed the radiological reports and agreed with the findings in the report.  ASSESSMENT AND PLAN:  Malignant neoplasm of upper-inner quadrant of left breast in female, estrogen receptor positive (HCC) 01/18/2014: ILC ER 70% PR 70% Ki67 7% HER2 negative (patient was seen by Dr. 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  Left sacral ala fracture, moderate to severe facet arthropathy L1-2 L5 and S1  Treatment plan: PET CT scan Consider biopsy of one of the lesions Based upon prognostic panel consider treatment options. Blood work today for [employer] antiestrogen therapy with anastrozole with the consideration for addition of Verzinio if she is truly estrogen receptor positive.  Return to clinic after the PET scan to discuss report   Assessment & Plan Anemia of chronic disease (likely secondary to malignancy) Mild anemia likely due to bone involvement from suspected metastatic breast cancer. Not iron deficiency as MCV is normal. No bleeding or bruising observed. - Encourage increased protein intake and hydration.  All  questions were answered. The patient knows to call the clinic with any problems, questions or concerns. I personally spent a total of 30 minutes in the care of the patient today including preparing to see the patient, getting/reviewing separately obtained history, performing a medically appropriate exam/evaluation, counseling and educating, placing orders, referring and communicating with other health care professionals, documenting clinical information in the EHR, independently interpreting results, communicating results, and coordinating care.   Viinay K Aalani Aikens, MD 02/07/24

## 2024-02-08 ENCOUNTER — Other Ambulatory Visit (HOSPITAL_COMMUNITY): Payer: Self-pay | Admitting: Hematology and Oncology

## 2024-02-08 ENCOUNTER — Telehealth: Payer: Self-pay | Admitting: *Deleted

## 2024-02-08 ENCOUNTER — Encounter: Payer: Self-pay | Admitting: *Deleted

## 2024-02-08 ENCOUNTER — Inpatient Hospital Stay
Admission: RE | Admit: 2024-02-08 | Discharge: 2024-02-08 | Disposition: A | Discharge status: Home / Self Care | Payer: Self-pay | Source: Ambulatory Visit | Attending: Hematology and Oncology | Admitting: Hematology and Oncology

## 2024-02-08 DIAGNOSIS — C50212 Malignant neoplasm of upper-inner quadrant of left female breast: Secondary | ICD-10-CM

## 2024-02-08 LAB — CANCER ANTIGEN 27.29: CA 27.29: 1466.2 U/mL — ABNORMAL HIGH (ref 0.0–38.6)

## 2024-02-08 LAB — CANCER ANTIGEN 15-3: CA 15-3: 1501 U/mL — ABNORMAL HIGH (ref 0.0–25.0)

## 2024-02-08 NOTE — Progress Notes (Signed)
 Received CD from [facility] of pt recent lumbar CT.  RN took CT to CT department to be uploaded to pt chart.

## 2024-02-08 NOTE — Telephone Encounter (Signed)
 Per MD request, RN scheduled MD f/u to review Guardant 360 and PET scan results.  RN attempt x1 to contact pt.  No answer, LVM.

## 2024-02-11 ENCOUNTER — Telehealth: Payer: Self-pay

## 2024-02-11 ENCOUNTER — Encounter: Payer: Self-pay | Admitting: Hematology and Oncology

## 2024-02-11 NOTE — Telephone Encounter (Signed)
 S/w patient regarding myChart message. Patient rescheduled PET scan to earlier date of 10/15. She requested that her appointment with Dr. Odean to review the scan be moved earlier. Patient's appointment rescheduled from 10/28 to 10/22 at 8:30 AM. Patient confirmed new appointment date and time.

## 2024-02-13 ENCOUNTER — Encounter (HOSPITAL_COMMUNITY)
Admission: RE | Admit: 2024-02-13 | Discharge: 2024-02-13 | Disposition: A | Discharge status: Home / Self Care | Source: Ambulatory Visit | Attending: Hematology and Oncology | Admitting: Hematology and Oncology

## 2024-02-13 DIAGNOSIS — C7951 Secondary malignant neoplasm of bone: Secondary | ICD-10-CM | POA: Diagnosis present

## 2024-02-13 DIAGNOSIS — Z170 Estrogen receptor positive status [ER+]: Secondary | ICD-10-CM | POA: Diagnosis present

## 2024-02-13 DIAGNOSIS — C50212 Malignant neoplasm of upper-inner quadrant of left female breast: Secondary | ICD-10-CM | POA: Insufficient documentation

## 2024-02-13 LAB — GLUCOSE, CAPILLARY: Glucose-Capillary: 96 mg/dL (ref 70–99)

## 2024-02-13 MED ORDER — FLUDEOXYGLUCOSE F - 18 (FDG) INJECTION
7.4500 | Freq: Once | INTRAVENOUS | Status: AC
  Administered 2024-02-13: 7.2 via INTRAVENOUS

## 2024-02-18 ENCOUNTER — Inpatient Hospital Stay (HOSPITAL_COMMUNITY): Admission: RE | Admit: 2024-02-18 | Source: Ambulatory Visit

## 2024-02-20 ENCOUNTER — Inpatient Hospital Stay: Admitting: Hematology and Oncology

## 2024-02-20 ENCOUNTER — Other Ambulatory Visit: Payer: Self-pay

## 2024-02-20 VITALS — BP 138/88 | HR 86 | Temp 97.7°F | Resp 18 | Ht 67.5 in | Wt 147.8 lb

## 2024-02-20 DIAGNOSIS — Z170 Estrogen receptor positive status [ER+]: Secondary | ICD-10-CM | POA: Diagnosis not present

## 2024-02-20 DIAGNOSIS — Z01810 Encounter for preprocedural cardiovascular examination: Secondary | ICD-10-CM

## 2024-02-20 DIAGNOSIS — C50212 Malignant neoplasm of upper-inner quadrant of left female breast: Secondary | ICD-10-CM

## 2024-02-20 DIAGNOSIS — Z01818 Encounter for other preprocedural examination: Secondary | ICD-10-CM

## 2024-02-20 NOTE — Progress Notes (Signed)
 Patient Care Team: Catherine Charlies LABOR, DO as PCP - General (Family Medicine) Latisha Medford, MD as Consulting Physician (Obstetrics and Gynecology) Leigh, Elspeth SQUIBB, MD as Consulting Physician (Gastroenterology) Dartha Ernst, MD as Consulting Physician (Endocrinology) Delane Lye, MD as Referring Physician (Orthopedic Surgery)  DIAGNOSIS:  Encounter Diagnosis  Name Primary?   Malignant neoplasm of upper-inner quadrant of left breast in female, estrogen receptor positive (HCC) Yes   CHIEF COMPLIANT: Follow-up after recent PET CT scan  HISTORY OF PRESENT ILLNESS: History of Present Illness [name] [name] is a 63 year old female with lobular breast cancer who presents for evaluation of metastatic disease to the bone. She is accompanied by a family member, though her name and relation are not specified.  She has a history of lobular breast cancer diagnosed in 2015, characterized as ER positive, PR positive, and HER2 negative. She experiences a constant ache in the pelvic region, affecting her mobility, particularly when getting up, with the pain most pronounced in the middle of the pelvic area. Joint pain in her shoulders is present, which she attributes to anastrozole.  Her current medications include anastrozole and letrozole. She experiences hot flashes and joint pain as side effects of anastrozole. She is also taking vitamin D  supplements but has stopped taking calcium  supplements for unspecified reasons.     ALLERGIES:  has no known allergies.  MEDICATIONS:  Current Outpatient Medications  Medication Sig Dispense Refill   anastrozole (ARIMIDEX) 1 MG tablet Take 1 tablet (1 mg total) by mouth daily. 90 tablet 3   Ascorbic Acid (VITAMIN C) 1000 MG tablet Take 1,000 mg by mouth daily.     Cholecalciferol (VITAMIN D3) 125 MCG (5000 UT) TABS Take by mouth daily with supper.     HYDROcodone -acetaminophen  (NORCO/VICODIN) 5-325 MG tablet 1 tablet. 5 mg     levothyroxine   (SYNTHROID ) 100 MCG tablet Take 1 tablet (100 mcg total) by mouth daily before breakfast. 90 tablet 3   MAGNESIUM GLYCINATE PO Take 1 capsule by mouth 4 (four) times a week. At bedtime.     Menaquinone-7 (K2 PO) Take 1 capsule by mouth daily with supper. MK-7 Vitamin K-2     OVER THE COUNTER MEDICATION Take 1 capsule by mouth daily with supper.     Vitamins A & D (VITAMIN A & D) 10000-400 units TABS Take 1 tablet by mouth daily with supper. Now Ultra A & D-3     No current facility-administered medications for this visit.    PHYSICAL EXAMINATION: ECOG PERFORMANCE STATUS: 1 - Symptomatic but completely ambulatory  Vitals:   02/20/24 0840  BP: 138/88  Pulse: 86  Resp: 18  Temp: 97.7 F (36.5 C)  SpO2: 100%   Filed Weights   02/20/24 0840  Weight: 147 lb 12.8 oz (67 kg)    LABORATORY DATA:  I have reviewed the data as listed    Latest Ref Rng & Units 01/28/2024    9:25 AM 07/19/2023    9:49 AM 05/11/2023    4:56 AM  CMP  Glucose 70 - 99 mg/dL 95  96    BUN 6 - 23 mg/dL 14  16    Creatinine 9.59 - 1.20 mg/dL 9.09  9.06    Sodium 864 - 145 mEq/L 138  138    Potassium 3.5 - 5.1 mEq/L 4.3  4.9    Chloride 96 - 112 mEq/L 101  104    CO2 19 - 32 mEq/L 27  27    Calcium  8.4 -  10.5 mg/dL 8.6 - 89.5 mg/dL 9.3    9.0  9.0  8.0   Total Protein 6.0 - 8.3 g/dL 6.8     Total Bilirubin 0.2 - 1.2 mg/dL 0.4     Alkaline Phos 39 - 117 U/L 130     AST 0 - 37 U/L 37     ALT 0 - 35 U/L 26       Lab Results  Component Value Date   WBC 5.1 01/28/2024   HGB 9.9 (L) 01/28/2024   HCT 29.5 (L) 01/28/2024   MCV 89.4 01/28/2024   PLT 318.0 01/28/2024   NEUTROABS 2.7 01/26/2023    ASSESSMENT & PLAN:  Malignant neoplasm of upper-inner quadrant of left breast in female, estrogen receptor positive (HCC) 01/18/2014: ILC ER 70% PR 70% Ki67 7% HER2 negative (patient was seen by Dr. 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  Left sacral ala fracture, moderate to severe facet arthropathy L1-2 L5 and S1   Treatment plan: PET CT scan: 02/13/2024: Diffuse bone metastases, no evidence of extraosseous metastases IR consult to biopsy of one of the lesions Based upon prognostic panel consider treatment options. Guardant 360: Pending Current treatment: Anastrozole started 02/13/2024.  Plan to hide Verzinio if pathology confirms estrogen positive breast cancer Tumor markers: CA 27-29 1466, CA 15-3: 1501   Return to clinic after the bone biopsy to discuss (we were able to find an appointment on 02/21/2024 for a bone marrow biopsy at [facility]) Follow-up with me in 1 week  Assessment & Plan Metastatic estrogen receptor positive, HER2 negative breast cancer with bone involvement Metastatic breast cancer with bone involvement confirmed by PET scan. Awaiting biopsy results for further confirmation. - Await biopsy results. - Continue anastrozole. - Start Verzenio post-biopsy confirmation and pharmacist consultation. - Consider radiation or osteo cool technology for bone pain post-biopsy. - Administer Xgeva every three months. - Encourage calcium  and vitamin D  supplementation. - Schedule pharmacist consultation for [facility] education and interaction check. - Monitor CA27-29 and CA15-3 periodically. - Order MRI of pelvis if pain persists. - Discuss radiation therapy if symptoms worsen.  Anemia in neoplastic disease Anemia likely secondary to neoplastic disease, no current concern for blood cancers. - Encourage balanced diet with iron-rich foods.      Orders Placed  This Encounter  Procedures   IR BONE MARROW BIOPSY & ASPIRATION    Please do extra core for additional testing    Standing Status:   Future    Expiration Date:   02/19/2025    Reason for Exam (SYMPTOM  OR DIAGNOSIS REQUIRED):   Breast cancer evaluation    Preferred Imaging Location?:   [facility]   The patient has a good understanding of the overall plan. she agrees with it. she will call with any problems that may develop before the next visit here.  I personally spent a total of 60 minutes in the care of the patient today including preparing to see the patient, getting/reviewing separately obtained history, performing a medically appropriate exam/evaluation, counseling and educating, placing orders, referring and communicating with other health care professionals, documenting clinical information  in the EHR, independently interpreting results, communicating results, and coordinating care.   Viinay K Morley Gaumer, MD 02/20/24

## 2024-02-20 NOTE — Progress Notes (Signed)
 Patient for CT Bone Marrow Biopsy on Thurs 02/21/24, I called and spoke with the patient on the phone and gave pre-procedure instructions. Pt was made aware to be here at 7:30a, NPO after MN prior to procedure as well as driver post procedure/recovery/discharge. Pt stated understanding.  Called 02/20/24

## 2024-02-20 NOTE — Assessment & Plan Note (Signed)
 01/18/2014: ILC ER 70% PR 70% Ki67 7% HER2 negative (patient was seen by Dr. 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  Left sacral ala fracture, moderate to severe facet arthropathy L1-2 L5 and S1   Treatment plan: PET CT scan: 02/13/2024: Diffuse bone metastases, no evidence of extraosseous metastases IR consult to biopsy of one of the lesions Based upon prognostic panel consider treatment options. Guardant 360: Pending Current treatment: Anastrozole started 02/13/2024.  Plan to hide Verzinio if pathology confirms estrogen positive breast cancer Tumor markers: CA 27-29 1466, CA 15-3: 1501   Return to clinic after the bone biopsy to discuss report

## 2024-02-21 ENCOUNTER — Ambulatory Visit
Admission: RE | Admit: 2024-02-21 | Discharge: 2024-02-21 | Disposition: A | Discharge status: Home / Self Care | Source: Ambulatory Visit | Attending: Hematology and Oncology | Admitting: Hematology and Oncology

## 2024-02-21 ENCOUNTER — Other Ambulatory Visit: Payer: Self-pay

## 2024-02-21 ENCOUNTER — Encounter: Payer: Self-pay | Admitting: Hematology and Oncology

## 2024-02-21 DIAGNOSIS — E785 Hyperlipidemia, unspecified: Secondary | ICD-10-CM | POA: Insufficient documentation

## 2024-02-21 DIAGNOSIS — K8020 Calculus of gallbladder without cholecystitis without obstruction: Secondary | ICD-10-CM | POA: Insufficient documentation

## 2024-02-21 DIAGNOSIS — D649 Anemia, unspecified: Secondary | ICD-10-CM | POA: Insufficient documentation

## 2024-02-21 DIAGNOSIS — K219 Gastro-esophageal reflux disease without esophagitis: Secondary | ICD-10-CM | POA: Insufficient documentation

## 2024-02-21 DIAGNOSIS — Z9221 Personal history of antineoplastic chemotherapy: Secondary | ICD-10-CM | POA: Insufficient documentation

## 2024-02-21 DIAGNOSIS — M899 Disorder of bone, unspecified: Secondary | ICD-10-CM | POA: Diagnosis present

## 2024-02-21 DIAGNOSIS — Z1722 Progesterone receptor negative status: Secondary | ICD-10-CM | POA: Diagnosis not present

## 2024-02-21 DIAGNOSIS — Z923 Personal history of irradiation: Secondary | ICD-10-CM | POA: Diagnosis not present

## 2024-02-21 DIAGNOSIS — N200 Calculus of kidney: Secondary | ICD-10-CM | POA: Insufficient documentation

## 2024-02-21 DIAGNOSIS — C7951 Secondary malignant neoplasm of bone: Secondary | ICD-10-CM | POA: Insufficient documentation

## 2024-02-21 DIAGNOSIS — C50212 Malignant neoplasm of upper-inner quadrant of left female breast: Secondary | ICD-10-CM | POA: Insufficient documentation

## 2024-02-21 DIAGNOSIS — Z79811 Long term (current) use of aromatase inhibitors: Secondary | ICD-10-CM | POA: Insufficient documentation

## 2024-02-21 DIAGNOSIS — Z01818 Encounter for other preprocedural examination: Secondary | ICD-10-CM

## 2024-02-21 DIAGNOSIS — Z1732 Human epidermal growth factor receptor 2 negative status: Secondary | ICD-10-CM | POA: Insufficient documentation

## 2024-02-21 DIAGNOSIS — Z170 Estrogen receptor positive status [ER+]: Secondary | ICD-10-CM | POA: Diagnosis not present

## 2024-02-21 DIAGNOSIS — Z9889 Other specified postprocedural states: Secondary | ICD-10-CM | POA: Diagnosis not present

## 2024-02-21 DIAGNOSIS — F419 Anxiety disorder, unspecified: Secondary | ICD-10-CM | POA: Insufficient documentation

## 2024-02-21 DIAGNOSIS — C7952 Secondary malignant neoplasm of bone marrow: Secondary | ICD-10-CM | POA: Diagnosis not present

## 2024-02-21 LAB — CBC WITH DIFFERENTIAL/PLATELET
Abs Immature Granulocytes: 0.09 K/uL — ABNORMAL HIGH (ref 0.00–0.07)
Basophils Absolute: 0 K/uL (ref 0.0–0.1)
Basophils Relative: 1 %
Eosinophils Absolute: 0.2 K/uL (ref 0.0–0.5)
Eosinophils Relative: 4 %
HCT: 32.6 % — ABNORMAL LOW (ref 36.0–46.0)
Hemoglobin: 10.1 g/dL — ABNORMAL LOW (ref 12.0–15.0)
Immature Granulocytes: 2 %
Lymphocytes Relative: 38 %
Lymphs Abs: 1.7 K/uL (ref 0.7–4.0)
MCH: 29.5 pg (ref 26.0–34.0)
MCHC: 31 g/dL (ref 30.0–36.0)
MCV: 95.3 fL (ref 80.0–100.0)
Monocytes Absolute: 0.3 K/uL (ref 0.1–1.0)
Monocytes Relative: 7 %
Neutro Abs: 2.2 K/uL (ref 1.7–7.7)
Neutrophils Relative %: 48 %
Platelets: 387 K/uL (ref 150–400)
RBC: 3.42 MIL/uL — ABNORMAL LOW (ref 3.87–5.11)
RDW: 16.7 % — ABNORMAL HIGH (ref 11.5–15.5)
WBC: 4.6 K/uL (ref 4.0–10.5)
nRBC: 0.7 % — ABNORMAL HIGH (ref 0.0–0.2)

## 2024-02-21 MED ORDER — MIDAZOLAM HCL (PF) 2 MG/2ML IJ SOLN
INTRAMUSCULAR | Status: AC | PRN
  Administered 2024-02-21 (×2): .5 mg via INTRAVENOUS
  Administered 2024-02-21: 1 mg via INTRAVENOUS

## 2024-02-21 MED ORDER — HEPARIN SOD (PORK) LOCK FLUSH 100 UNIT/ML IV SOLN
500.0000 [IU] | Freq: Once | INTRAVENOUS | Status: AC
  Administered 2024-02-21: 500 [IU] via INTRAVENOUS
  Filled 2024-02-21: qty 5

## 2024-02-21 MED ORDER — HEPARIN SOD (PORK) LOCK FLUSH 100 UNIT/ML IV SOLN
INTRAVENOUS | Status: AC
  Filled 2024-02-21: qty 5

## 2024-02-21 MED ORDER — FENTANYL CITRATE (PF) 100 MCG/2ML IJ SOLN
INTRAMUSCULAR | Status: AC
  Filled 2024-02-21: qty 4

## 2024-02-21 MED ORDER — FENTANYL CITRATE (PF) 100 MCG/2ML IJ SOLN
INTRAMUSCULAR | Status: AC | PRN
  Administered 2024-02-21 (×2): 25 ug via INTRAVENOUS
  Administered 2024-02-21: 50 ug via INTRAVENOUS

## 2024-02-21 MED ORDER — SODIUM CHLORIDE 0.9 % IV SOLN: INTRAVENOUS | Status: DC

## 2024-02-21 MED ORDER — LIDOCAINE HCL (PF) 1 % IJ SOLN
10.0000 mL | Freq: Once | INTRAMUSCULAR | Status: AC
Start: 2024-02-21 — End: 2024-02-21
  Administered 2024-02-21: 10 mL
  Filled 2024-02-21: qty 10

## 2024-02-21 MED ORDER — MIDAZOLAM HCL 2 MG/2ML IJ SOLN
INTRAMUSCULAR | Status: AC
  Filled 2024-02-21: qty 4

## 2024-02-21 NOTE — Discharge Instructions (Signed)
 Bone Marrow Aspiration and Bone Marrow Biopsy, Adult, Care After This sheet gives you information about how to care for yourself after your procedure. If you have problems or questions, contact your health care provider.  What can I expect after the procedure?  After the procedure, it is common to have: Mild pain and tenderness. Swelling. Bruising.  Follow these instructions at home: Take over-the-counter or prescription medicines only as told by your health care provider. You may shower tomorrow Remove band aid tomorrow, replace with another bandaid if  site has any drainage from biopsy site. Wash your hands with soap and water before you touch your biopsy site  If soap and water are not available, use hand sanitizer. Change your dressing frequently for bleeding and/or drainage. Check your puncture site every day for signs of infection. Check for: More redness, swelling, or pain. More fluid or blood. Warmth. Pus or a bad smell. Return to your normal activities in 24hours.  Do not drive for 24 hours if you were given a medicine to help you relax (sedative). Keep all follow-up visits as told by your health care provider. This is important. Contact a health care provider if: You have more redness, swelling, or pain around the puncture site. You have more fluid or blood coming from the puncture site. Your puncture site feels warm to the touch. You have pus or a bad smell coming from the puncture site. You have a fever. Your pain is not controlled with medicine. This information is not intended to replace advice given to you by your health care provider. Make sure you discuss any questions you have with your health care provider. Document Released: 11/04/2004 Document Revised: 11/05/2015 Document Reviewed: 09/29/2015 Elsevier Interactive Patient Education  2018 [employer].

## 2024-02-21 NOTE — Procedures (Signed)
 Interventional Radiology Procedure Note  Procedure: CT Guided Biopsy of bone marrow and bone lesion  Complications: None  Estimated Blood Loss: < 10 mL  Findings: 13 G core biopsy of right iliac bone performed under CT guidance.  Aspiration  and core samples obtained and sent to Pathology.  13 G core biopsy of right iliac bone lesion performed under CT guidance.  Aspiration  and core samples obtained and sent to Pathology.    Cordella DELENA Banner, MD

## 2024-02-21 NOTE — H&P (Signed)
 Chief Complaint: Patient was seen in consultation today for bone lesions.  Referring Physician(s): Odean Potts  Supervising Physician: Jenna Hacker  Patient Status: [facility] - Out-pt  History of Present Illness: [name] [name] is a 63 y.o. female with a past medical history significant for anemia, anxiety, GERD, HLD and breast cancer who presents today for bone lesion/marrow biopsy. [name] [name] was diagnosed with lobular breast cancer in 2015 and has undergone lumpectomy, chemotherapy and radiation which was completed in 2016. She is currently on anastrozole + letrozole with recent complaints of pelvic pain which was initially felt to be a side effect from these medications. She underwent PET scan on 02/13/24 which showed:  1. Diffuse osseous metastasis. 2. No evidence of hypermetabolic extraosseous metastasis. 3. Incidental findings, including: cholelithiasis. Right nephrolithiasis.  She was seen by oncology on 10/22 where recommendation for bone lesion/bone marrow biopsy was recommended to further direct treatment options.  Past Medical History:  Diagnosis Date   Anemia in neoplastic disease 02/06/2014   Anxiety disorder    Mild: treated by her gynecologist with selective serotonin reuptake inhibitor   Arthritis    hands and feet   Breast cancer (HCC) 09/09/2013   Family history of malignant neoplasm of breast    Fibroid uterus    hysterctomy   GERD (gastroesophageal reflux disease)    Hot flashes    Hyperlipidemia    Malignant neoplasm of breast (female), unspecified site 08/07/2013   left breast invasive mammary ca in situ   Nonspecific abnormal electrocardiogram (ECG) (EKG) 2009   Osteoarthritis    Palpitations 2009   Improved; Secondary to premature ventricular contractions. No problems since-pt states was caring for ailing father, anxiety   Personal history of radiation therapy    left breast 2015   Psychophysiological insomnia 04/15/2020   Skin lesion 08/10/2015    Right forearm and left shoulder  [facility] note 08/2015: Dr. Forestine, inflamed seborrheic keratosis left posterior shoulder, no treatment. Actinic keratosis on right arm with cryotherapy. Benign scattered seborrheic keratosis.   Thyroid  disease    Tick bite of right thigh 2016   Use of tamoxifen  (Nolvadex ) 08/10/2015   Started: 03/2014    Wears glasses     Past Surgical History:  Procedure Laterality Date   ABDOMINAL HYSTERECTOMY  02/07/2011   Procedure: HYSTERECTOMY ABDOMINAL;  Surgeon: Truman Corona;  Location: [facility];  Service: Gynecology;  Laterality: N/A;   BREAST EXCISIONAL BIOPSY Right 10/07/2014   high risk   BREAST EXCISIONAL BIOPSY Left 12/24/2015   benign   BREAST LUMPECTOMY Left 09/09/2013   left breast 2015   BREAST LUMPECTOMY WITH NEEDLE LOCALIZATION AND AXILLARY LYMPH NODE DISSECTION Left 09/09/2013   Procedure: LEFT BREAST LUMPECTOMY WITH NEEDLE LOCALIZATION AND LEFT AXILLARY LYMPH NODE DISSECTION;  Surgeon: Morene ONEIDA Olives, MD;  Location: [facility];  Service: General;  Laterality: Left;   BREAST LUMPECTOMY WITH RADIOACTIVE SEED LOCALIZATION Right 10/07/2014   Procedure: RIGHT BREAST LUMPECTOMY WITH RADIOACTIVE SEED LOCALIZATION;  Surgeon: Morene Olives, MD;  Location: [facility];  Service: General;  Laterality: Right;   BREAST LUMPECTOMY WITH RADIOACTIVE SEED LOCALIZATION Left 12/24/2015   Procedure: LEFT BREAST LUMPECTOMY WITH RADIOACTIVE SEED LOCALIZATION;  Surgeon: Morene Olives, MD;  Location: [facility];  Service: General;  Laterality: Left;   CESAREAN SECTION     DILATION AND CURETTAGE OF UTERUS     ENDOMETRIAL ABLATION  2006   LAPAROSCOPIC ASSISTED VAGINAL HYSTERECTOMY  02/07/2011   Procedure: LAPAROSCOPIC ASSISTED VAGINAL  HYSTERECTOMY;  Surgeon: Truman Corona;  Location: [facility];  Service: Gynecology;  Laterality: N/A;   PORTACATH PLACEMENT Right 09/29/2013   Procedure: INSERTION  PORT-A-CATH;  Surgeon: Morene ONEIDA Olives, MD;  Location: [facility];  Service: General;  Laterality: Right;   THYROIDECTOMY, PARTIAL Left 2001   TOTAL THYROIDECTOMY  05/10/2023    Allergies: Patient has no known allergies.  Medications: Prior to Admission medications   Medication Sig Start Date End Date Taking? Authorizing Provider  anastrozole (ARIMIDEX) 1 MG tablet Take 1 tablet (1 mg total) by mouth daily. 02/07/24  Yes Gudena, Vinay, MD  Ascorbic Acid (VITAMIN C) 1000 MG tablet Take 1,000 mg by mouth daily.   Yes [provider]  Cholecalciferol (VITAMIN D3) 125 MCG (5000 UT) TABS Take by mouth daily with supper.   Yes [provider]  HYDROcodone -acetaminophen  (NORCO/VICODIN) 5-325 MG tablet 1 tablet. 5 mg 01/31/24  Yes [provider]  levothyroxine  (SYNTHROID ) 100 MCG tablet Take 1 tablet (100 mcg total) by mouth daily before breakfast. 01/29/24 01/28/25 Yes Kuneff, Renee A, DO  MAGNESIUM GLYCINATE PO Take 1 capsule by mouth 4 (four) times a week. At bedtime.   Yes [provider]  Menaquinone-7 (K2 PO) Take 1 capsule by mouth daily with supper. MK-7 Vitamin K-2   Yes [provider]  Vitamins A & D (VITAMIN A & D) 10000-400 units TABS Take 1 tablet by mouth daily with supper. Now Ultra A & D-3   Yes [provider]  OVER THE COUNTER MEDICATION Take 1 capsule by mouth daily with supper. Patient not taking: Reported on 02/21/2024    [provider]     Family History  Problem Relation Age of Onset   Angina Father    Aortic aneurysm Father    Dementia Father    CAD Father    Hypertension Other    Arrhythmia Other        Uncle- a fib   Breast cancer Mother        dx 58s. Died at 43   Non-Hodgkin's lymphoma Mother    Arrhythmia Maternal Aunt        A fib   Breast cancer Maternal Aunt 18       currently 48   Thyroid  disease Maternal Aunt    Arrhythmia Maternal Aunt        A fib   Breast cancer  Maternal Aunt        dx 32s; currently 13s   Colon cancer Maternal Grandmother        dx 63s; died at 48   Thyroid  disease Maternal Grandmother    Cancer Paternal Uncle        unknown primary in early 24s   Breast cancer Maternal Aunt        dx 51s; died in 6s   Thyroid  disease Maternal Uncle    Rectal cancer Paternal Grandmother    Stomach cancer Neg Hx    Esophageal cancer Neg Hx     Social History   Socioeconomic History   Marital status: Married    Spouse name: Not on file   Number of children: Not on file   Years of education: Not on file   Highest education level: 12th grade  Occupational History   Occupation: Register of deeds    Comment: Full time  Tobacco Use   Smoking status: Never    Passive exposure: Never   Smokeless tobacco: Never  Vaping Use   Vaping  status: Never Used  Substance and Sexual Activity   Alcohol use: Yes    Alcohol/week: 2.0 standard drinks of alcohol    Types: 2 Glasses of wine per week    Comment: socially   Drug use: Not Currently    Comment: smoked marijuana last month   Sexual activity: Yes    Birth control/protection: Surgical  Other Topics Concern   Not on file  Social History Narrative   Married, [place]. 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    Drinks caffeine beverages   Wears seatbelt, smoke detector in the home   Firearms in the home   Exercises >3 x week.    Feels safe in relationships.    Social Drivers of Corporate investment banker Strain: Low Risk  (01/25/2024)   Overall Financial Resource Strain (CARDIA)    Difficulty of Paying Living Expenses: Not hard at all  Food Insecurity: No Food Insecurity (02/07/2024)   Hunger Vital Sign    Worried About Running Out of Food in the Last Year: Never true    Ran Out of Food in the Last Year: Never true  Transportation Needs: No Transportation Needs (02/07/2024)   PRAPARE -  Administrator, Civil Service (Medical): No    Lack of Transportation (Non-Medical): No  Physical Activity: Unknown (01/25/2024)   Exercise Vital Sign    Days of Exercise per Week: Patient declined    Minutes of Exercise per Session: Not on file  Stress: No Stress Concern Present (01/25/2024)   [employer] of Occupational Health - Occupational Stress Questionnaire    Feeling of Stress: Not at all  Social Connections: Socially Integrated (01/25/2024)   Social Connection and Isolation Panel    Frequency of Communication with Friends and Family: Twice a week    Frequency of Social Gatherings with Friends and Family: Once a week    Attends Religious Services: More than 4 times per year    Active Member of [employer] or Organizations: Yes    Attends Engineer, structural: More than 4 times per year    Marital Status: Married     Review of Systems: A 12 point ROS discussed and pertinent positives are indicated in the HPI above.  All other systems are negative.  Review of Systems  Constitutional:  Negative for chills and fever.  Respiratory:  Negative for cough and shortness of breath.   Cardiovascular:  Negative for chest pain.  Gastrointestinal:  Negative for abdominal pain, nausea and vomiting.  Musculoskeletal:  Positive for arthralgias (back, pelvis - unchanged from baseline).  Neurological:  Negative for dizziness and headaches.    Vital Signs: BP 135/74   Pulse 73   Temp 98 F (36.7 C) (Temporal)   Resp 18   Ht 5' 7 (1.702 m)   Wt 147 lb 0.8 oz (66.7 kg)   SpO2 96%   BMI 23.03 kg/m   Physical Exam Vitals reviewed.  Constitutional:      General: She is not in acute distress. HENT:     Head: Normocephalic.     Mouth/Throat:     Mouth: Mucous membranes are moist.     Pharynx: Oropharynx is clear. No oropharyngeal exudate or posterior oropharyngeal erythema.  Cardiovascular:     Rate and Rhythm: Normal rate and regular rhythm.  Pulmonary:      Effort: Pulmonary effort is normal.  Breath sounds: Normal breath sounds.  Abdominal:     General: There is no distension.     Palpations: Abdomen is soft.     Tenderness: There is no abdominal tenderness.  Skin:    General: Skin is warm and dry.  Neurological:     Mental Status: She is alert and oriented to person, place, and time.  Psychiatric:        Mood and Affect: Mood normal.        Behavior: Behavior normal.        Thought Content: Thought content normal.        Judgment: Judgment normal.      MD Evaluation Airway: WNL Heart: WNL Abdomen: WNL Chest/ Lungs: WNL ASA  Classification: 3 Mallampati/Airway Score: One   Imaging: NM PET Image Initial (PI) Skull Base To Thigh (F-18 FDG) Result Date: 02/14/2024 EXAM: PET AND CT SKULL BASE TO MID THIGH 02/13/2024 04:51:38 PM TECHNIQUE: RADIOPHARMACEUTICAL: 7.2 mCi F-18 FDG Uptake time 60 minutes. Glucose level 96 mg/dl. PET imaging was acquired from the base of the skull to the mid thighs. Non-contrast enhanced computed tomography was obtained for attenuation correction and anatomic localization. COMPARISON: Chest CT PET of 09/24/2013 also reviewed. CLINICAL HISTORY: Breast cancer, invasive, stage IV, initial workup; Bone mets. 7.2 mCi F18-FDG in RAC by SAW/BWM @ 1524.; Bs- 96 mg/dL; Hx of left breast ca in 2015. Left lumpectomy.; Thyroidectomy in August 2024. Benign; Recent back pain, spot found on MRI; eov FINDINGS: HEAD AND NECK: No metabolically active cervical lymphadenopathy. Left maxillary sinus mucous retention cyst or polyp. Thyroidectomy. CHEST: No metabolically active pulmonary nodules. No metabolically active lymphadenopathy. Left axillary node dissection. Left lumpectomy. Heart size accentuated by a pectus excavatum deformity. Left apical presumably radiation induced fibrosis. ABDOMEN AND PELVIS: No metabolically active intraperitoneal mass. No metabolically active lymphadenopathy. Normal adrenal glands. An interpolar left  renal 8 mm hyperattenuating lesion is decreased in size from 09/24/2013, most consistent with a complex cyst. . At least 1 punctate right renal collecting system calculus. Lower pole left renal 1.7 cm lesion is favored to represent a minimally complex cyst based on density measurements. Present back in 2015. 4 mm dependent gallstone. Hysterectomy. Mild pelvic floor laxity. BONES AND SOFT TISSUE: Diffuse hypermetabolic osseous metastasis. Example infiltrative and destructive mass involving the majority of the sacrum including at SUV 6.5 on image 168 / 4. An inferior L2 lesion measures SUV 8.7. Bilateral rib fractures which are primarily felt to be nonacute. the 7th lateral right rib fracture is incompletely healed. IMPRESSION: 1. Diffuse osseous metastasis. 2. No evidence of hypermetabolic extraosseous metastasis. 3. Incidental findings, including: cholelithiasis. Right nephrolithiasis. Electronically signed by: Rockey Kilts MD 02/14/2024 03:08 PM EDT RP Workstation: [id number]   MR OUTSIDE FILMS SPINE Result Date: 02/08/2024 This examination belongs to an outside facility and is stored here for comparison purposes only.  Contact the originating outside institution for any associated report or interpretation.   Labs:  CBC: Recent Labs    05/04/23 1423 01/28/24 0925  WBC 6.0 5.1  HGB 11.5* 9.9*  HCT 34.6* 29.5*  PLT 303 318.0    COAGS: No results for input(s): INR, APTT in the last 8760 hours.  BMP: Recent Labs    05/04/23 1423 05/11/23 0456 07/19/23 0949 01/28/24 0925  NA 138  --  138 138  K 4.2  --  4.9 4.3  CL 105  --  104 101  CO2 24  --  27 27  GLUCOSE 107*  --  96 95  BUN 22  --  16 14  CALCIUM  8.8* 8.0* 9.0 9.0  9.3  CREATININE 0.99  --  0.93 0.90  GFRNONAA >60  --   --   --     LIVER FUNCTION TESTS: Recent Labs    01/28/24 0925  BILITOT 0.4  AST 37  ALT 26  ALKPHOS 130*  PROT 6.8  ALBUMIN 4.0    TUMOR MARKERS: No results for input(s): AFPTM, CEA,  CA199, CHROMGRNA in the last 8760 hours.  Assessment and Plan:  63 y/o F with history of lobular breast cancer s/p lumpectomy, chemotherapy and radiation (2015-2016) currently on anastrozole + letrozole found to have diffuse osseous metastasis on PET scan 02/13/24 who presents today for bone lesion/marrow biopsy to further direct treatment options.  Risks and benefits of bone lesion/marrow biopsy was discussed with the patient and/or patient's family including, but not limited to bleeding, infection, damage to adjacent structures or low yield requiring additional tests.  All of the questions were answered and there is agreement to proceed.  Consent signed and in chart.  Thank you for this interesting consult.  I greatly enjoyed meeting [name] [name] and look forward to participating in their care.  A copy of this report was sent to the requesting provider on this date.  Electronically Signed: Clotilda DELENA Hesselbach, PA-C 02/21/2024, 7:56 AM   I spent a total of  30 Minutes  in face to face in clinical consultation, greater than 50% of which was counseling/coordinating care for bone lesions.

## 2024-02-25 LAB — SURGICAL PATHOLOGY

## 2024-02-26 ENCOUNTER — Ambulatory Visit: Admitting: Hematology and Oncology

## 2024-02-26 ENCOUNTER — Encounter: Payer: Self-pay | Admitting: Hematology and Oncology

## 2024-02-28 ENCOUNTER — Encounter: Payer: Self-pay | Admitting: Hematology and Oncology

## 2024-02-28 ENCOUNTER — Telehealth: Payer: Self-pay | Admitting: Pharmacist

## 2024-02-28 ENCOUNTER — Other Ambulatory Visit (HOSPITAL_COMMUNITY): Payer: Self-pay

## 2024-02-28 ENCOUNTER — Inpatient Hospital Stay (HOSPITAL_BASED_OUTPATIENT_CLINIC_OR_DEPARTMENT_OTHER): Admitting: Hematology and Oncology

## 2024-02-28 ENCOUNTER — Telehealth: Payer: Self-pay

## 2024-02-28 VITALS — BP 122/72 | HR 72 | Temp 97.4°F | Resp 18 | Ht 67.0 in | Wt 147.9 lb

## 2024-02-28 DIAGNOSIS — Z170 Estrogen receptor positive status [ER+]: Secondary | ICD-10-CM | POA: Diagnosis not present

## 2024-02-28 DIAGNOSIS — C50212 Malignant neoplasm of upper-inner quadrant of left female breast: Secondary | ICD-10-CM

## 2024-02-28 MED ORDER — ABEMACICLIB 100 MG PO TABS
100.0000 mg | ORAL_TABLET | Freq: Two times a day (BID) | ORAL | 3 refills | Status: AC
  Filled 2024-03-05: qty 56, 28d supply, fill #0
  Filled 2024-03-25 (×2): qty 56, 28d supply, fill #1
  Filled 2024-04-01 – 2024-04-23 (×2): qty 56, 28d supply, fill #2
  Filled 2024-05-19 – 2024-05-20 (×2): qty 56, 28d supply, fill #3

## 2024-02-28 NOTE — Telephone Encounter (Signed)
 [facility]    Oncology Clinical Pharmacist Practitioner Initial Assessment  Received new prescription for abemaciclib for the treatment of breast cancer in the metastatic setting. This is being given in combination with anastrozole started 02/13/24 and denosumab 120 mg (Xgeva) that will be starting on 03/05/24. It is planned to continue until disease progression or unacceptable toxicity.  Labs from 02/28/24 assessed. Prescription dose and frequency assessed.   Current medication list in Epic reviewed. Significant DDIs with abemaciclib identified:No.  Evaluated chart, patient barriers to medication adherence identified: No.  Patient agreement for treatment documented in MD note on 02/28/24.  Prescription has been e-scribed to the [facility]) for benefits analysis and approval.  Oral Oncology Clinic will continue to follow for insurance authorization, copayment issues, initial counseling and start date.  Asier Desroches A. Lucila, PharmD, BCOP, CPP Hematology-Oncology Clinical Pharmacist Practitioner  02/28/2024 1:27 PM  **Disclaimer: This note was dictated with voice recognition software. Similar sounding words can inadvertently be transcribed and this note may contain transcription errors which may not have been corrected upon publication of note.**

## 2024-02-28 NOTE — Telephone Encounter (Signed)
 Oral Oncology Patient Advocate Encounter  After completing a benefits investigation, prior authorization for Verzenio is not required at this time through [facility].  Patient's copay is $188.64.      Charlott Hamilton,  CPhT-Adv  she/her/hers [facility]  [facility] Pharmacy Technician Patient Advocate Specialist III [facility] Phone: [phone]  Fax: [phone] [email or web address]

## 2024-02-28 NOTE — Progress Notes (Addendum)
 Patient Care Team: Catherine Charlies LABOR, DO as PCP - General (Family Medicine) Latisha Medford, MD as Consulting Physician (Obstetrics and Gynecology) Leigh, Elspeth SQUIBB, MD as Consulting Physician (Gastroenterology) Dartha Ernst, MD as Consulting Physician (Endocrinology) Delane Lye, MD as Referring Physician (Orthopedic Surgery)  DIAGNOSIS:  Encounter Diagnosis  Name Primary?   Malignant neoplasm of upper-inner quadrant of left breast in female, estrogen receptor positive (HCC) Yes      CHIEF COMPLIANT: Follow-up of metastatic breast cancer  HISTORY OF PRESENT ILLNESS: History of Present Illness [name] [name] is a 63 year old female with breast cancer who presents for follow-up after a recent biopsy and discussion of treatment options.  She recently underwent a biopsy due to increased activity in the pelvis bone on imaging. Pathology confirmed breast cancer that is estrogen receptor positive, progesterone receptor positive, and HER2 negative, with a PIK3CA mutation. Genetic testing showed no hereditary mutations.  She is on anastrozole with no reported side effects. She experiences fatigue, describing difficulty with activities such as walking around the grocery store. She reports mild back pain, rated 1-2 out of 10, and has not used pain medication in the past week.     ALLERGIES:  has no known allergies.  MEDICATIONS:  Current Outpatient Medications  Medication Sig Dispense Refill   abemaciclib (VERZENIO) 100 MG tablet Take 1 tablet (100 mg total) by mouth 2 (two) times daily. 60 tablet 3   anastrozole (ARIMIDEX) 1 MG tablet Take 1 tablet (1 mg total) by mouth daily. 90 tablet 3   Cholecalciferol (VITAMIN D3) 125 MCG (5000 UT) TABS Take by mouth daily with supper.     HYDROcodone -acetaminophen  (NORCO/VICODIN) 5-325 MG tablet 1 tablet. 5 mg     MAGNESIUM GLYCINATE PO Take 1 capsule by mouth 4 (four) times a week. At bedtime.     Menaquinone-7 (K2 PO) Take 1 capsule by  mouth daily with supper. MK-7 Vitamin K-2     OVER THE COUNTER MEDICATION Take 1 capsule by mouth daily with supper.     Vitamins A & D (VITAMIN A & D) 10000-400 units TABS Take 1 tablet by mouth daily with supper. Now Ultra A & D-3     levothyroxine  (SYNTHROID ) 100 MCG tablet Take 1 tablet (100 mcg total) by mouth daily before breakfast. (Patient not taking: Reported on 02/28/2024) 90 tablet 3   No current facility-administered medications for this visit.    PHYSICAL EXAMINATION: ECOG PERFORMANCE STATUS: 1 - Symptomatic but completely ambulatory  Vitals:   02/28/24 1100  BP: 122/72  Pulse: 72  Resp: 18  Temp: (!) 97.4 F (36.3 C)  SpO2: 100%   Filed Weights   02/28/24 1100  Weight: 147 lb 14.4 oz (67.1 kg)     LABORATORY DATA:  I have reviewed the data as listed    Latest Ref Rng & Units 01/28/2024    9:25 AM 07/19/2023    9:49 AM 05/11/2023    4:56 AM  CMP  Glucose 70 - 99 mg/dL 95  96    BUN 6 - 23 mg/dL 14  16    Creatinine 9.59 - 1.20 mg/dL 9.09  9.06    Sodium 864 - 145 mEq/L 138  138    Potassium 3.5 - 5.1 mEq/L 4.3  4.9    Chloride 96 - 112 mEq/L 101  104    CO2 19 - 32 mEq/L 27  27    Calcium  8.4 - 10.5 mg/dL 8.6 - 89.5 mg/dL 9.3  9.0  9.0  8.0   Total Protein 6.0 - 8.3 g/dL 6.8     Total Bilirubin 0.2 - 1.2 mg/dL 0.4     Alkaline Phos 39 - 117 U/L 130     AST 0 - 37 U/L 37     ALT 0 - 35 U/L 26       Lab Results  Component Value Date   WBC 4.6 02/21/2024   HGB 10.1 (L) 02/21/2024   HCT 32.6 (L) 02/21/2024   MCV 95.3 02/21/2024   PLT 387 02/21/2024   NEUTROABS 2.2 02/21/2024    ASSESSMENT & PLAN:  Malignant neoplasm of upper-inner quadrant of left breast in female, estrogen receptor positive (HCC) 01/18/2014: ILC ER 70% PR 70% Ki67 7% HER2 negative (patient was seen by Dr. Layla) 09/09/2013: Left lumpectomy with ALND: Grade 1 ILC HER2 -8/23 lymph nodes positive June 2015 to March 20, 2014: Adjuvant chemo Adriamycin  and Cytoxan  dose dense x  4 followed by Taxol  weekly x 12 07/01/2014: Adjuvant radiation and needed 08/01/2014-Apr 2021: tamoxifen  01/24/2024: Hip and low back pain (sacroiliitis): MRI lumbar spine for right-sided radiculopathy: Extensive metastatic disease to the lumbar spine sacrum and ilium with expansion of S2 vertebral body.  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  Plan to add Verzinio  Tumor markers: CA 27-29 1466, CA 15-3: 1501 [facility] molecular testing has been requested  Abemaciclib counseling: I discussed at length the risks and benefits of Abemaciclib in combination with letrozole. Adverse effects of Abemaciclib include decreasing neutrophil count, pneumonia, blood clots in lungs as well as nausea and GI symptoms. Side effects of letrozole include hot flashes, muscle aches and pains, uterine bleeding/spotting/cancer, osteoporosis, risk of blood clots.  Follow-up in 1 week for education with Norleen on Verzenio ------------------------------------- Assessment and Plan Assessment & Plan Metastatic estrogen and progesterone receptor positive, HER2 negative breast cancer with bone involvement Breast cancer confirmed as estrogen and progesterone receptor positive, HER2 negative. PIK3CA mutation present. Tumor mutation burden 15. Average drug metabolizer. No significant hereditary mutations. - Continue anastrozole. - Initiate Verzenio (abemacyclib) 100 mg twice daily, adjust based on tolerance and side effects. - Schedule pharmacist consultation for [place] education. - Monitor for Verzenio side effects: diarrhea, fatigue, liver function changes, blood count decreases. - Prescribe Imodium for  diarrhea with specific dosing. - Conduct CBC and CMP biweekly initially, then monthly, then every three months. - Administer Xgeva injection every three months. - Schedule scans every three months for disease progression. - Perform PET scan in six months. - Monitor CA 27-29 and CA 15-3 markers for treatment efficacy.  Cancer-related pain Reports minimal pain, level 1-2, primarily in the middle back. OsteoCool not suitable. - Monitor pain levels, adjust management as needed. - Consider alternative pain management if pain worsens.  Anemia in neoplastic disease Anemia likely related to neoplastic disease. - Continue monitoring hemoglobin levels in routine blood work.      Orders Placed This Encounter  Procedures   CBC with Differential (Cancer Center Only)    Standing Status:   Standing    Number of Occurrences:   100    Expiration Date:   02/27/2025   CMP (Cancer Center only)    Standing Status:  Standing    Number of Occurrences:   100    Expiration Date:   02/27/2025   CA 27.29    Standing Status:   Standing    Number of Occurrences:   100    Expiration Date:   02/27/2025   CA 15.3    Standing Status:   Standing    Number of Occurrences:   100    Expiration Date:   02/27/2025   The patient has a good understanding of the overall plan. she agrees with it. she will call with any problems that may develop before the next visit here.  I personally spent a total of 45 minutes in the care of the patient today including preparing to see the patient, getting/reviewing separately obtained history, performing a medically appropriate exam/evaluation, counseling and educating, placing orders, referring and communicating with other health care professionals, documenting clinical information in the EHR, independently interpreting results, communicating results, and coordinating care.   Naomi MARLA Chad, MD 02/28/24  Addendum: Bone marrow biopsy was performed because of extensive bone  marrow involvement with metastatic breast cancer as suggested on PET CT scan

## 2024-02-28 NOTE — Assessment & Plan Note (Signed)
 01/18/2014: ILC ER 70% PR 70% Ki67 7% HER2 negative (patient was seen by Dr. 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  Plan to add Verzinio  Tumor markers: CA 27-29 1466, CA 15-3: 1501 [facility]is molecular testing has been requested  Follow-up in 1 week for education with Norleen on Verzenio

## 2024-02-28 NOTE — Telephone Encounter (Signed)
 Patient would like Delivery  to home address on Friday 03-07-24  Patient is aware of copay and provided payment

## 2024-02-29 ENCOUNTER — Encounter (HOSPITAL_COMMUNITY): Payer: Self-pay | Admitting: Hematology and Oncology

## 2024-02-29 ENCOUNTER — Other Ambulatory Visit: Payer: Self-pay | Admitting: Pharmacist

## 2024-03-03 NOTE — Progress Notes (Signed)
 It was performed because the PET CT scan suggested bone and bone marrow involvement.  This is for diagnosis of metastatic breast cancer.  I made an addendum to her note

## 2024-03-04 ENCOUNTER — Ambulatory Visit (HOSPITAL_COMMUNITY)
Admission: RE | Admit: 2024-03-04 | Discharge: 2024-03-04 | Disposition: A | Discharge status: Home / Self Care | Source: Ambulatory Visit | Attending: Hematology and Oncology | Admitting: Hematology and Oncology

## 2024-03-04 ENCOUNTER — Other Ambulatory Visit: Payer: Self-pay | Admitting: *Deleted

## 2024-03-04 DIAGNOSIS — M25551 Pain in right hip: Secondary | ICD-10-CM | POA: Insufficient documentation

## 2024-03-04 NOTE — Progress Notes (Unsigned)
 [facility]       Telephone: [phone]?Fax: [phone]   Oncology Clinical Pharmacist Practitioner Initial Assessment  [name] [name] is a 63 y.o. female with a diagnosis of breast cancer. They were contacted today via in-person visit.  Indication/Regimen Abemaciclib (Verzenio) is being used appropriately for treatment of metastatic breast cancer by [name] [name]. The treatment goal is: Control.     Wt Readings from Last 1 Encounters:  03/05/24 147 lb 3.2 oz (66.8 kg)    Estimated body surface area is 1.78 meters squared as calculated from the following:   Height as of 02/28/24: 5' 7 (1.702 m).   Weight as of this encounter: 147 lb 3.2 oz (66.8 kg).  The dosing regimen is 100 mg by mouth every 12 hours on days 1 to 28 of a 28-day cycle. This is being given  in combination with anastrozole started on 02/13/24 and zoledronic acid 3.3 mg (reduced due to estimated CrCl today of 45.8 mL/min) IV starting today [name] [name] was not preferred by her insurance) Treatment regimen is planned to continue until disease progression or unacceptable toxicity. Prescription dose and frequency assessed for appropriateness.  Patient has agreed to treatment which is documented in physician note on 02/28/24. Counseled patient on administration, dosing, side effects, monitoring, drug-food interactions, safe handling, storage, and disposal.  Patient requested delivery on 11/725 and she will start next day.   Dose Modifications [name] [name] is starting abemaciclib at 100 mg PO BID  Access Assessment [name] [name] will be receiving abemaciclib through [facility] Concerns: None Start date if known: 03/08/24  Adherence Assessment Reviewed importance on keeping a med schedule and plan for any missed doses Barriers to adherence identified? No  Communication and Learning Assessment Primary learner: patient Barriers to learning: No barriers Preferred  language: English Learning preferences: Listening   Allergies No Known Allergies  Vitals    03/05/2024    2:13 PM 02/28/2024   11:00 AM 02/21/2024    9:52 AM  Oncology Vitals  Height  170 cm   Weight 66.769 kg 67.087 kg   Weight (lbs) 147 lbs 3 oz 147 lbs 14 oz   BMI 23.05 kg/m2 23.16 kg/m2   Temp 98.3 F (36.8 C) 97.4 F (36.3 C) 97.8 F (36.6 C)  Pulse Rate 96 72 86  BP 132/73 122/72 112/66  Resp 18 18 16   SpO2 100 % 100 % 100 %  BSA (m2) 1.78 m2 1.78 m2      Laboratory Data    Latest Ref Rng & Units 03/05/2024    1:22 PM 02/21/2024    7:46 AM 01/28/2024    9:25 AM  CBC EXTENDED  WBC 4.0 - 10.5 K/uL 4.5  4.6  5.1   RBC 3.87 - 5.11 MIL/uL 3.33  3.42  3.30   Hemoglobin 12.0 - 15.0 g/dL 89.9  89.8  9.9   HCT 63.9 - 46.0 % 30.3  32.6  29.5   Platelets 150 - 400 K/uL 244  387  318.0   NEUT# 1.7 - 7.7 K/uL 2.5  2.2    Lymph# 0.7 - 4.0 K/uL 1.3  1.7         Latest Ref Rng & Units 03/05/2024    1:22 PM 01/28/2024    9:25 AM 07/19/2023    9:49 AM  CMP  Glucose 70 - 99 mg/dL 893  95  96   BUN 8 - 23 mg/dL 21  14  16  Creatinine 0.44 - 1.00 mg/dL 8.80  9.09  9.06   Sodium 135 - 145 mmol/L 140  138  138   Potassium 3.5 - 5.1 mmol/L 4.3  4.3  4.9   Chloride 98 - 111 mmol/L 104  101  104   CO2 22 - 32 mmol/L 28  27  27    Calcium  8.9 - 10.3 mg/dL 9.0  9.3    9.0  9.0   Total Protein 6.5 - 8.1 g/dL 7.4  6.8    Total Bilirubin 0.0 - 1.2 mg/dL 0.6  0.4    Alkaline Phos 38 - 126 U/L 180  130    AST 15 - 41 U/L 21  37    ALT 0 - 44 U/L 19  26     No results found for: MG Lab Results  Component Value Date   CA2729 1,466.2 (H) 02/07/2024    Contraindications Contraindications were reviewed? Yes Contraindications to therapy were identified? No   Safety Precautions The following safety precautions for the use of abemaciclib were reviewed:  Changes in kidney function: importance of drinking plenty of fluids and monitoring urine output Diarrhea: we reviewed that  diarrhea is common with abemaciclib and confirmed that she does have loperamide (Imodium) at home.  We reviewed how to take this medication PRN and gave her information on abemaciclib Decreased white blood cells (WBCs) and increased risk for infection: we discussed the importance of having a thermometer and what the Centers for Disease Control and Prevention (CDC) considers a fever which is 100.73F (38C) or higher.  Gave patient 24/7 triage line to call if any fevers or symptoms Decreased hemoglobin, part of red blood cells that carry iron and oxygen Fatigue Nausea and Vomiting Hepatotoxicity: reviewed to contact clinic for RUQ pain that will not subside, yellowing of eyes/skin Decreased appetite or weight loss Abdominal pain Decreased platelet count and increased risk for bleeding Venous thromboembolism (VTE): reviewed signs of deep vein thrombosis (DVT) such as leg swelling, redness, pain, or tenderness and signs of pulmonary embolism (PE) such as shortness of breath, rapid or irregular heartbeat, cough, chest pain, or lightheadedness ILD/Pneumonitis: we reviewed potential symptoms including cough, shortness, and fatigue. Handling body fluids and waste Pregnancy, sexual activity, and contraception Avoid grapefruit products Reviewed to take the medication every 12 hours (with food sometimes can be easier on the stomach) and to take it at the same time every day. Discussed proper storage and handling of abemaciclib  Medication Reconciliation Current Outpatient Medications  Medication Sig Dispense Refill   anastrozole (ARIMIDEX) 1 MG tablet Take 1 tablet (1 mg total) by mouth daily. 90 tablet 3   Cholecalciferol (VITAMIN D3) 125 MCG (5000 UT) TABS Take by mouth daily with supper.     levothyroxine  (SYNTHROID ) 100 MCG tablet Take 1 tablet (100 mcg total) by mouth daily before breakfast. 90 tablet 3   MAGNESIUM GLYCINATE PO Take 1 capsule by mouth 4 (four) times a week. At bedtime.      Menaquinone-7 (K2 PO) Take 1 capsule by mouth daily with supper. MK-7 Vitamin K-2     Vitamins A & D (VITAMIN A & D) 10000-400 units TABS Take 1 tablet by mouth daily with supper. Now Ultra A & D-3     abemaciclib (VERZENIO) 100 MG tablet Take 1 tablet (100 mg total) by mouth 2 (two) times daily. (Patient not taking: Reported on 03/05/2024) 60 tablet 3   HYDROcodone -acetaminophen  (NORCO/VICODIN) 5-325 MG tablet 1 tablet. 5 mg (Patient not taking: Reported  on 03/05/2024)     No current facility-administered medications for this visit.    Medication reconciliation is based on the patient's most recent medication list in the electronic medical record (EMR) including herbal products and OTC medications.   The patient's medication list was reviewed today with the patient? Yes   Drug-drug interactions (DDIs) DDIs were evaluated? Yes Significant DDIs identified? No   Drug-Food Interactions Drug-food interactions were evaluated? Yes Drug-food interactions identified? Grapefruit products  Follow-up Plan  Patient education handout given to patient Start abemaciclib 100 mg by mouth every 12 hours. Start date 03/08/24 Continue anastrozole 1 mg by mouth daily Start zoledronic acid 3.3 mg IV (reduced due to estimated CrCl of 45.8 mL/min today) every 12 weeks today. Next due in 12 weeks (05/28/24). Insurance did not approve Xgeva. Dr. Gudena is okay starting without dental clearance which was given today to patient. Monitor for side effects Distress thermometer completed during in person visit and reviewed with patient. Due to score, social work referral has not been sent.. Although patient score was 8, she preferred not to be seen by social work at this time. Labs, [name] [name] visit on 03/24/24 Will add labs, pharmacy clinic visit in 5 weeks. [name] [name] can follow up with clinical pharmacy as deemed necessary by Dr. Mackey [name] going forward   [name] [name] participated in the discussion, expressed  understanding, and voiced agreement with the above plan. All questions were answered to her satisfaction. The patient was advised to contact the clinic at [phone] with any questions or concerns prior to her return visit.   I spent 60 minutes assessing the patient.  Iveth Heidemann A. Lucila, PharmD, BCOP, CPP  Norleen DELENA Lucila, RPH-CPP, 03/05/2024 2:43 PM  **Disclaimer: This note was dictated with voice recognition software. Similar sounding words can inadvertently be transcribed and this note may contain transcription errors which may not have been corrected upon publication of note.**

## 2024-03-04 NOTE — Progress Notes (Signed)
 Received call from pt with complaint of right hip pain unable to bear weight.  RN reviewed with MD and verbal orders received and placed to obtain right hip xray with pelvis.  Pt educated to come to [facility] for walk in xray appt, pt verbalized understanding.

## 2024-03-05 ENCOUNTER — Inpatient Hospital Stay

## 2024-03-05 ENCOUNTER — Other Ambulatory Visit: Payer: Self-pay

## 2024-03-05 ENCOUNTER — Encounter: Payer: Self-pay | Admitting: Hematology and Oncology

## 2024-03-05 ENCOUNTER — Other Ambulatory Visit (HOSPITAL_COMMUNITY): Payer: Self-pay

## 2024-03-05 ENCOUNTER — Inpatient Hospital Stay: Attending: Hematology and Oncology | Admitting: Pharmacist

## 2024-03-05 ENCOUNTER — Other Ambulatory Visit: Payer: Self-pay | Admitting: Pharmacist

## 2024-03-05 VITALS — BP 132/73 | HR 96 | Temp 98.3°F | Resp 18 | Wt 147.2 lb

## 2024-03-05 DIAGNOSIS — M47817 Spondylosis without myelopathy or radiculopathy, lumbosacral region: Secondary | ICD-10-CM | POA: Insufficient documentation

## 2024-03-05 DIAGNOSIS — M5418 Radiculopathy, sacral and sacrococcygeal region: Secondary | ICD-10-CM | POA: Insufficient documentation

## 2024-03-05 DIAGNOSIS — Z79890 Hormone replacement therapy (postmenopausal): Secondary | ICD-10-CM | POA: Insufficient documentation

## 2024-03-05 DIAGNOSIS — M4726 Other spondylosis with radiculopathy, lumbar region: Secondary | ICD-10-CM | POA: Insufficient documentation

## 2024-03-05 DIAGNOSIS — Z170 Estrogen receptor positive status [ER+]: Secondary | ICD-10-CM | POA: Insufficient documentation

## 2024-03-05 DIAGNOSIS — Z1721 Progesterone receptor positive status: Secondary | ICD-10-CM | POA: Insufficient documentation

## 2024-03-05 DIAGNOSIS — Z79811 Long term (current) use of aromatase inhibitors: Secondary | ICD-10-CM | POA: Insufficient documentation

## 2024-03-05 DIAGNOSIS — C50212 Malignant neoplasm of upper-inner quadrant of left female breast: Secondary | ICD-10-CM | POA: Insufficient documentation

## 2024-03-05 DIAGNOSIS — S32119A Unspecified Zone I fracture of sacrum, initial encounter for closed fracture: Secondary | ICD-10-CM | POA: Insufficient documentation

## 2024-03-05 DIAGNOSIS — M25559 Pain in unspecified hip: Secondary | ICD-10-CM | POA: Insufficient documentation

## 2024-03-05 DIAGNOSIS — Z79899 Other long term (current) drug therapy: Secondary | ICD-10-CM | POA: Insufficient documentation

## 2024-03-05 DIAGNOSIS — C7951 Secondary malignant neoplasm of bone: Secondary | ICD-10-CM | POA: Insufficient documentation

## 2024-03-05 DIAGNOSIS — Z1732 Human epidermal growth factor receptor 2 negative status: Secondary | ICD-10-CM | POA: Insufficient documentation

## 2024-03-05 LAB — CMP (CANCER CENTER ONLY)
ALT: 19 U/L (ref 0–44)
AST: 21 U/L (ref 15–41)
Albumin: 4.3 g/dL (ref 3.5–5.0)
Alkaline Phosphatase: 180 U/L — ABNORMAL HIGH (ref 38–126)
Anion gap: 8 (ref 5–15)
BUN: 21 mg/dL (ref 8–23)
CO2: 28 mmol/L (ref 22–32)
Calcium: 9 mg/dL (ref 8.9–10.3)
Chloride: 104 mmol/L (ref 98–111)
Creatinine: 1.19 mg/dL — ABNORMAL HIGH (ref 0.44–1.00)
GFR, Estimated: 51 mL/min — ABNORMAL LOW (ref >60)
Glucose, Bld: 106 mg/dL — ABNORMAL HIGH (ref 70–99)
Potassium: 4.3 mmol/L (ref 3.5–5.1)
Sodium: 140 mmol/L (ref 135–145)
Total Bilirubin: 0.6 mg/dL (ref 0.0–1.2)
Total Protein: 7.4 g/dL (ref 6.5–8.1)

## 2024-03-05 LAB — CBC WITH DIFFERENTIAL (CANCER CENTER ONLY)
Abs Immature Granulocytes: 0.04 K/uL (ref 0.00–0.07)
Basophils Absolute: 0 K/uL (ref 0.0–0.1)
Basophils Relative: 1 %
Eosinophils Absolute: 0.2 K/uL (ref 0.0–0.5)
Eosinophils Relative: 5 %
HCT: 30.3 % — ABNORMAL LOW (ref 36.0–46.0)
Hemoglobin: 10 g/dL — ABNORMAL LOW (ref 12.0–15.0)
Immature Granulocytes: 1 %
Lymphocytes Relative: 30 %
Lymphs Abs: 1.3 K/uL (ref 0.7–4.0)
MCH: 30 pg (ref 26.0–34.0)
MCHC: 33 g/dL (ref 30.0–36.0)
MCV: 91 fL (ref 80.0–100.0)
Monocytes Absolute: 0.4 K/uL (ref 0.1–1.0)
Monocytes Relative: 8 %
Neutro Abs: 2.5 K/uL (ref 1.7–7.7)
Neutrophils Relative %: 55 %
Platelet Count: 244 K/uL (ref 150–400)
RBC: 3.33 MIL/uL — ABNORMAL LOW (ref 3.87–5.11)
RDW: 17.6 % — ABNORMAL HIGH (ref 11.5–15.5)
WBC Count: 4.5 K/uL (ref 4.0–10.5)
nRBC: 0.4 % — ABNORMAL HIGH (ref 0.0–0.2)

## 2024-03-05 MED ORDER — ZOLEDRONIC ACID 4 MG/5ML IV CONC
3.3000 mg | Freq: Once | INTRAVENOUS | Status: AC
  Administered 2024-03-05: 3.3 mg via INTRAVENOUS
  Filled 2024-03-05: qty 4.13

## 2024-03-05 MED ORDER — SODIUM CHLORIDE 0.9 % IV SOLN: INTRAVENOUS | Status: DC

## 2024-03-05 NOTE — Progress Notes (Signed)
 Specialty Pharmacy Initial Fill Coordination Note  [name] [name] is a 63 y.o. female contacted today regarding refills of specialty medication(s) Abemaciclib (VERZENIO) .  Patient requested Delivery  on 03/07/24  to verified address [address] [place] [address]   Medication will be filled on 03/06/24.   Patient is aware of $188.64 copayment.

## 2024-03-05 NOTE — Patient Instructions (Signed)

## 2024-03-05 NOTE — Progress Notes (Signed)
 Patient counseled in clinic in-person visit note on 03/05/24

## 2024-03-06 ENCOUNTER — Other Ambulatory Visit: Payer: Self-pay

## 2024-03-06 LAB — CANCER ANTIGEN 27.29: CA 27.29: 1138.7 U/mL — ABNORMAL HIGH (ref 0.0–38.6)

## 2024-03-06 LAB — CANCER ANTIGEN 15-3: CA 15-3: 1297 U/mL — ABNORMAL HIGH (ref 0.0–25.0)

## 2024-03-06 LAB — GUARDANT 360

## 2024-03-09 ENCOUNTER — Encounter: Payer: Self-pay | Admitting: Hematology and Oncology

## 2024-03-10 ENCOUNTER — Other Ambulatory Visit: Payer: Self-pay

## 2024-03-10 ENCOUNTER — Telehealth: Payer: Self-pay

## 2024-03-10 DIAGNOSIS — C50212 Malignant neoplasm of upper-inner quadrant of left female breast: Secondary | ICD-10-CM

## 2024-03-10 DIAGNOSIS — C7951 Secondary malignant neoplasm of bone: Secondary | ICD-10-CM

## 2024-03-10 NOTE — Telephone Encounter (Signed)
 S/w patient about recent myChart message regarding results from hip x-ray.  Dr. Odean reviewed the results and has made the recommendation that patient be evaluated for possible palliative radiation to the bone. Patient agreeable to the recommendation and referral.  Referral placed. All other questions answered at time of call. Patient verbalized an understanding of the information.

## 2024-03-11 ENCOUNTER — Encounter: Payer: Self-pay | Admitting: *Deleted

## 2024-03-11 DIAGNOSIS — C7951 Secondary malignant neoplasm of bone: Secondary | ICD-10-CM | POA: Insufficient documentation

## 2024-03-11 NOTE — Progress Notes (Incomplete)
 Radiation Oncology         [phone]) [phone] ________________________________  Name: [name] [name]        MRN: [id number]  Date of Service: 03/12/2024 DOB: 05-16-1960  RR:[id number], Charlies LABOR, DO  Odean Potts, MD     REFERRING PHYSICIAN: Odean Potts, MD   DIAGNOSIS: The primary encounter diagnosis was Malignant neoplasm of upper-inner quadrant of left breast in female, estrogen receptor positive (HCC). Diagnoses of Pain from bone metastases (HCC) and Metastasis to bone [facility]) were also pertinent to this visit.   HISTORY OF PRESENT ILLNESS: [name] [name] is a 63 y.o. female seen at the request of Dr. Odean for a diagnosis of metastatic breast cancer to bone. 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 She was treated with left lumpectomy with ALND, followed by systemic chemotherapy and adjuvant radiotherapy and antiestrogen therapy with Dr. Layla. She had a right lumpectomy in 2016 for a CSL and in 2017 lumpectomy of the left breast that showed PASH and fibrocystic change without malignancy. She completed 5 years of tamoxifen  and returned to routine yearly breast exams and mammography after this time.   She was doing well and had a normal screening mammogram in August 2025 however in the springtime of this year started having discomfort in her shoulder blades and pelvis.  She was evaluated by her PCP and had outside imaging at [facility] and an MRI of the lumbar spine on 01/24/2024 showed extensive metastatic disease involving the lumbar spine sacrum and ilium with expansion of the S2 vertebral body but no evidence of pathologic fracture though there was a possible left lateral sacral ala fracture.  She was referred back urgently to the cancer center and was seen by Dr. Odean.  A PET scan on 02/13/2024 showed diffuse hypermetabolic osseous metastatic disease with evidence of the destructive  findings in the sacrum with an SUV of 6.5, L2 with an SUV of 8.7, bilateral rib fractures felt to be nonacute, 7th lateral right rib fracture was incompletely healed. A bone marrow biopsy on 02/21/2024 showed bone marrow completely replaced by metastatic lobular carcinoma. The cancer was ER/PR positive, HER2 negative.  She was started on Arimidex and Verzenio.  Plain films of the hips and pelvis on 03/04/2024 showed extensive mixed sclerotic and lucent lesions throughout the pelvis and proximal femurs and no evidence of pathologic fracture.  She she is seen to consider a palliative course of radiotherapy.   PREVIOUS RADIATION THERAPY: Yes   2015 The patient received 6 1/2 weeks of radiotherapy to the left breast and regional nodes in the [facility] clinic with Dr. Dewey.   PAST MEDICAL HISTORY:  Past Medical History:  Diagnosis Date   Anemia in neoplastic disease 02/06/2014   Anxiety disorder    Mild: treated by her gynecologist with selective serotonin reuptake inhibitor   Arthritis    hands and feet   Breast cancer (HCC) 09/09/2013   Family history of malignant neoplasm of breast    Fibroid uterus    hysterctomy   GERD (gastroesophageal reflux disease)    Hot flashes    Hyperlipidemia    Malignant neoplasm of breast (female), unspecified site 08/07/2013   left breast invasive mammary ca in situ   Nonspecific abnormal electrocardiogram (ECG) (EKG) 2009   Osteoarthritis    Palpitations 2009   Improved; Secondary to premature ventricular contractions. No problems since-pt states was caring for ailing  father, anxiety   Personal history of radiation therapy    left breast 2015   Psychophysiological insomnia 04/15/2020   Skin lesion 08/10/2015   Right forearm and left shoulder  [place] dermatology note 08/2015: Dr. Forestine, inflamed seborrheic keratosis left posterior shoulder, no treatment. Actinic keratosis on right arm with cryotherapy. Benign scattered seborrheic keratosis.   Thyroid   disease    Tick bite of right thigh 2016   Use of tamoxifen  (Nolvadex ) 08/10/2015   Started: 03/2014    Wears glasses        PAST SURGICAL HISTORY: Past Surgical History:  Procedure Laterality Date   ABDOMINAL HYSTERECTOMY  02/07/2011   Procedure: HYSTERECTOMY ABDOMINAL;  Surgeon: Truman Corona;  Location: [facility];  Service: Gynecology;  Laterality: N/A;   BREAST EXCISIONAL BIOPSY Right 10/07/2014   high risk   BREAST EXCISIONAL BIOPSY Left 12/24/2015   benign   BREAST LUMPECTOMY Left 09/09/2013   left breast 2015   BREAST LUMPECTOMY WITH NEEDLE LOCALIZATION AND AXILLARY LYMPH NODE DISSECTION Left 09/09/2013   Procedure: LEFT BREAST LUMPECTOMY WITH NEEDLE LOCALIZATION AND LEFT AXILLARY LYMPH NODE DISSECTION;  Surgeon: Morene ONEIDA Olives, MD;  Location: [facility];  Service: General;  Laterality: Left;   BREAST LUMPECTOMY WITH RADIOACTIVE SEED LOCALIZATION Right 10/07/2014   Procedure: RIGHT BREAST LUMPECTOMY WITH RADIOACTIVE SEED LOCALIZATION;  Surgeon: Morene Olives, MD;  Location: [facility];  Service: General;  Laterality: Right;   BREAST LUMPECTOMY WITH RADIOACTIVE SEED LOCALIZATION Left 12/24/2015   Procedure: LEFT BREAST LUMPECTOMY WITH RADIOACTIVE SEED LOCALIZATION;  Surgeon: Morene Olives, MD;  Location: [facility];  Service: General;  Laterality: Left;   CESAREAN SECTION     DILATION AND CURETTAGE OF UTERUS     ENDOMETRIAL ABLATION  2006   LAPAROSCOPIC ASSISTED VAGINAL HYSTERECTOMY  02/07/2011   Procedure: LAPAROSCOPIC ASSISTED VAGINAL HYSTERECTOMY;  Surgeon: Truman Corona;  Location: [facility];  Service: Gynecology;  Laterality: N/A;   PORTACATH PLACEMENT Right 09/29/2013   Procedure: INSERTION PORT-A-CATH;  Surgeon: Morene ONEIDA Olives, MD;  Location: [facility];  Service: General;  Laterality: Right;   THYROIDECTOMY, PARTIAL Left 2001   TOTAL THYROIDECTOMY  05/10/2023     FAMILY HISTORY:  Family  History  Problem Relation Age of Onset   Angina Father    Aortic aneurysm Father    Dementia Father    CAD Father    Hypertension Other    Arrhythmia Other        Uncle- a fib   Breast cancer Mother        dx 30s. Died at 66   Non-Hodgkin's lymphoma Mother    Arrhythmia Maternal Aunt        A fib   Breast cancer Maternal Aunt 85       currently 58   Thyroid  disease Maternal Aunt    Arrhythmia Maternal Aunt        A fib   Breast cancer Maternal Aunt        dx 67s; currently 38s   Colon cancer Maternal Grandmother        dx 79s; died at 12   Thyroid  disease Maternal Grandmother    Cancer Paternal Uncle        unknown primary in early 79s   Breast cancer Maternal Aunt        dx 73s; died in 83s   Thyroid  disease Maternal Uncle    Rectal cancer Paternal Grandmother    Stomach cancer Neg Hx  Esophageal cancer Neg Hx      SOCIAL HISTORY:  reports that she has never smoked. She has never been exposed to tobacco smoke. She has never used smokeless tobacco. She reports that she does not currently use alcohol after a past usage of about 2.0 standard drinks of alcohol per week. She reports that she does not currently use drugs. The patient is married and lives in [place], [place]. She is accompanied by her husband. She enjoys gardening in the warm months.    ALLERGIES: Patient has no known allergies.   MEDICATIONS:  Current Outpatient Medications  Medication Sig Dispense Refill   abemaciclib (VERZENIO) 100 MG tablet Take 1 tablet (100 mg total) by mouth 2 (two) times daily. (Patient not taking: Reported on 03/05/2024) 60 tablet 3   anastrozole (ARIMIDEX) 1 MG tablet Take 1 tablet (1 mg total) by mouth daily. 90 tablet 3   Cholecalciferol (VITAMIN D3) 125 MCG (5000 UT) TABS Take by mouth daily with supper.     HYDROcodone -acetaminophen  (NORCO/VICODIN) 5-325 MG tablet 1 tablet. 5 mg (Patient not taking: Reported on 03/05/2024)     levothyroxine  (SYNTHROID ) 100 MCG tablet Take 1  tablet (100 mcg total) by mouth daily before breakfast. 90 tablet 3   MAGNESIUM GLYCINATE PO Take 1 capsule by mouth 4 (four) times a week. At bedtime.     Menaquinone-7 (K2 PO) Take 1 capsule by mouth daily with supper. MK-7 Vitamin K-2     Vitamins A & D (VITAMIN A & D) 10000-400 units TABS Take 1 tablet by mouth daily with supper. Now Ultra A & D-3     No current facility-administered medications for this encounter.     REVIEW OF SYSTEMS: On review of systems, the patient reports that that she is doing okay. She struggles with pain and over the last 6 months, different sites have bothered her. Initially, her pain was in her mid back and right shoulder region, then in the ribs more so on the left. She states these areas have since resolved but now her pain is mainly involving the very low back in the posterior pelvic area and right posterior hip in the right buttock region. She describes the pain as intermittent, especially when trying to move from a seated to a standing position, and affecting her walking as far as stability. Pain does seem to be more noticeable at night time as well. She is taking ibuprofen  and rarely taking Hydrocodone . She denies numbness, tingling, shooting, or burning qualities to her pain. She states she's been happy with how well she's tolerating her Verzenio and Anastrozole and asked about risks of cancer for her children since her cancer recurred. She had negative genetic testing in 2015. No other complaints are verbalized.      PHYSICAL EXAM:  Wt Readings from Last 3 Encounters:  03/12/24 144 lb 2 oz (65.4 kg)  03/05/24 147 lb 3.2 oz (66.8 kg)  02/28/24 147 lb 14.4 oz (67.1 kg)   Temp Readings from Last 3 Encounters:  03/12/24 (!) 97.5 F (36.4 C) (Temporal)  03/05/24 98.3 F (36.8 C) (Temporal)  02/28/24 (!) 97.4 F (36.3 C) (Temporal)   BP Readings from Last 3 Encounters:  03/12/24 136/83  03/05/24 132/73  02/28/24 122/72   Pulse Readings from Last 3  Encounters:  03/12/24 91  03/05/24 96  02/28/24 72   Pain Assessment Pain Score: 10-Worst pain ever (weight bearing right hip) Pain Loc: Hip (right)/10  In general this is a well appearing caucasian  female in no acute distress. She's alert and oriented x4 and appropriate throughout the examination. Cardiopulmonary assessment is negative for acute distress and she exhibits normal effort. She finds that standing during our discussion is the most comfortable position but is able to get up from a chair and sit, but is somewhat unsteady when doing so and with taking steps with her right leg to go to the restroom in the clinic.     ECOG = 1  0 - Asymptomatic (Fully active, able to carry on all predisease activities without restriction)  1 - Symptomatic but completely ambulatory (Restricted in physically strenuous activity but ambulatory and able to carry out work of a light or sedentary nature. For example, light housework, office work)  2 - Symptomatic, <50% in bed during the day (Ambulatory and capable of all self care but unable to carry out any work activities. Up and about more than 50% of waking hours)  3 - Symptomatic, >50% in bed, but not bedbound (Capable of only limited self-care, confined to bed or chair 50% or more of waking hours)  4 - Bedbound (Completely disabled. Cannot carry on any self-care. Totally confined to bed or chair)  5 - Death   Raylene MM, Creech RH, Tormey DC, et al. [phone]). Toxicity and response criteria of the [facility]. Am. DOROTHA Bridges. Oncol. 5 (6): 649-55    LABORATORY DATA:  Lab Results  Component Value Date   WBC 4.5 03/05/2024   HGB 10.0 (L) 03/05/2024   HCT 30.3 (L) 03/05/2024   MCV 91.0 03/05/2024   PLT 244 03/05/2024   Lab Results  Component Value Date   NA 140 03/05/2024   K 4.3 03/05/2024   CL 104 03/05/2024   CO2 28 03/05/2024   Lab Results  Component Value Date   ALT 19 03/05/2024   AST 21 03/05/2024    ALKPHOS 180 (H) 03/05/2024   BILITOT 0.6 03/05/2024      RADIOGRAPHY: DG HIP UNILAT W OR W/O PELVIS 2-3 VIEWS RIGHT Result Date: 03/04/2024 EXAM: 2 or more VIEW(S) XRAY OF THE RIGHT HIP 03/04/2024 12:24:00 PM COMPARISON: PET CT images through the pelvis from 02/13/2024. CLINICAL HISTORY: Right hip pain, unable to bear weight. FINDINGS: BONES AND JOINTS: Extensive patchy sclerosis throughout the bony pelvis and including the proximal femurs with some regions of relative lucency compatible with diffuse metastatic breast cancer to the skeleton. The most notable lucent lesion is in the left paracentral sacrum, although scattered lesions are also noted in the proximal femoral shaft, right ischium, right pubic rami, and right femoral head. These bony lesions have been hypermetabolic on PET CT. A definite current pathologic fracture is not observed. The hip joint is maintained. No significant degenerative changes. SOFT TISSUES: The soft tissues are unremarkable. IMPRESSION: 1. Extensive mixed sclerotic and lucent lesions throughout the pelvis and proximal femurs, compatible with diffuse skeletal metastases from breast cancer. 2. No definite current pathologic fracture. Electronically signed by: Ryan Salvage MD 03/04/2024 01:17 PM EST RP Workstation: [id number]   CT BONE MARROW BIOPSY & ASPIRATION Result Date: 02/21/2024 CLINICAL DATA:  Elevated serum protein.  Evaluate for myeloma. EXAM: CT-guided bone marrow biopsy and bone lesion biopsy TECHNIQUE: CT pelvis CONTRAST:  None RADIOPHARMACEUTICALS:  None COMPARISON:  None FINDINGS: The patient was placed in prone position on the CT gantry. Radiopaque markers were placed on the patient's skin and initial imaging of the pelvis was performed. The patient's skin was then prepped and draped in the usual  sterile fashion. Moderate sedation was provided for by the nursing staff under my supervision utilizing intravenous Versed  and fentanyl . The nurse had no other  duties other than monitoring the patient and providing sedation during the procedure. I was present for the entire procedure. 2 mg intravenous Versed  and 100 mcg of intravenous fentanyl  were administered for a total sedation time of 19 minutes 1% lidocaine  was used to infiltrate the skin at the access site prior to a stab incision. Local anesthesia was then used to infiltrate the region of soft tissue from the skin to the right iliac bone. The bone marrow needle was then advanced and imaging demonstrated the needle tip to be in the cortex of the right iliac bone. The bone was then penetrated and a sample was obtained. After the sample was evaluated, approximately 5 mL of heparinized bone marrow sample was obtained by aspiration. A core sample was then obtained. Multiple attempts at sampling was performed in order to get a 2 cm segment. All needles were then removed from the patient. The needle was then reintroduced into the right posterior iliac bone under CT guidance and advanced to a region which was FDG avid on the most recent PET scan. Once the needle tip was identified in this region a core needle sample was obtained of the sclerotic focus within the iliac bone. This core sample was also sent to pathology. Aspiration of this region was also obtained and sent to pathology. Sterile dressing was applied. IMPRESSION: Satisfactory core needle biopsy and aspiration of the right iliac bone marrow under CT guidance. A core needle biopsy was also performed of the right iliac bone lesion as demonstrated by increased FDG activity on the most recent PET scan. Electronically Signed   By: Cordella Banner   On: 02/21/2024 14:11   NM PET Image Initial (PI) Skull Base To Thigh (F-18 FDG) Result Date: 02/14/2024 EXAM: PET AND CT SKULL BASE TO MID THIGH 02/13/2024 04:51:38 PM TECHNIQUE: RADIOPHARMACEUTICAL: 7.2 mCi F-18 FDG Uptake time 60 minutes. Glucose level 96 mg/dl. PET imaging was acquired from the base of the skull to  the mid thighs. Non-contrast enhanced computed tomography was obtained for attenuation correction and anatomic localization. COMPARISON: Chest CT PET of 09/24/2013 also reviewed. CLINICAL HISTORY: Breast cancer, invasive, stage IV, initial workup; Bone mets. 7.2 mCi F18-FDG in RAC by SAW/BWM @ 1524.; Bs- 96 mg/dL; Hx of left breast ca in 2015. Left lumpectomy.; Thyroidectomy in August 2024. Benign; Recent back pain, spot found on MRI; eov FINDINGS: HEAD AND NECK: No metabolically active cervical lymphadenopathy. Left maxillary sinus mucous retention cyst or polyp. Thyroidectomy. CHEST: No metabolically active pulmonary nodules. No metabolically active lymphadenopathy. Left axillary node dissection. Left lumpectomy. Heart size accentuated by a pectus excavatum deformity. Left apical presumably radiation induced fibrosis. ABDOMEN AND PELVIS: No metabolically active intraperitoneal mass. No metabolically active lymphadenopathy. Normal adrenal glands. An interpolar left renal 8 mm hyperattenuating lesion is decreased in size from 09/24/2013, most consistent with a complex cyst. . At least 1 punctate right renal collecting system calculus. Lower pole left renal 1.7 cm lesion is favored to represent a minimally complex cyst based on density measurements. Present back in 2015. 4 mm dependent gallstone. Hysterectomy. Mild pelvic floor laxity. BONES AND SOFT TISSUE: Diffuse hypermetabolic osseous metastasis. Example infiltrative and destructive mass involving the majority of the sacrum including at SUV 6.5 on image 168 / 4. An inferior L2 lesion measures SUV 8.7. Bilateral rib fractures which are primarily felt to be nonacute.  the 7th lateral right rib fracture is incompletely healed. IMPRESSION: 1. Diffuse osseous metastasis. 2. No evidence of hypermetabolic extraosseous metastasis. 3. Incidental findings, including: cholelithiasis. Right nephrolithiasis. Electronically signed by: Rockey Kilts MD 02/14/2024 03:08 PM EDT RP  Workstation: [id number]       IMPRESSION/PLAN: 1. Recurrent Metastatic ER/PR positive lobular carcinoma of the breast involving the bone. Dr. Dewey discusses the pathology findings and reviews the nature of metastatic breast disease. Dr. Dewey offers the patient palliative radiotherapy to the sacrum and posterior iliac region at the site of her right hip pain. We discussed the risks, benefits, short, and long term effects of radiotherapy, as well as the curative intent, and the patient is interested in proceeding. Dr. Dewey discusses the delivery and logistics of radiotherapy and anticipates a course of 2 weeks of radiotherapy to the sacrum and posterior target of the pelvis. Written consent is obtained and placed in the chart, a copy was provided to the patient. She is planning to request a second opinion at [facility] while taking Verzenio and Anastrozole. She will also continue to follow up with Dr. Gudena. We also discussed reaching out to orthopedic oncology and she prefers this at [facility] to see if she needs any stabilizing procedures for any of her areas of disease. She will return tomorrow for simulation.  2. Possible genetic predisposition to malignancy. The patient had negative genetic testing in 2015, I reached out to genetics to see if she needed her testing updated and they would like to see her. The patient is interested in this as well. A new referral will be placed.     In a visit lasting 60 minutes, greater than 50% of the time was spent face to face discussing the patient's condition, in preparation for the discussion, and coordinating the patient's care.   The above documentation reflects my direct findings during this shared patient visit. Please see the separate note by Dr. Dewey on this date for the remainder of the patient's plan of care.    Donald KYM Husband, [facility]   **Disclaimer: This note was dictated with voice recognition software. Similar sounding words can inadvertently be  transcribed and this note may contain transcription errors which may not have been corrected upon publication of note.**

## 2024-03-11 NOTE — Progress Notes (Signed)
 Received dental clearance from Dr. Lacinda Melchert sting pt does receive regular dental care but currently has a broken tooth.  Stating pt does not wish to proceed with a crown at this time and would prefer to have the tooth pulled.  Pt currently not scheduled for procedure.  Pt will alert our office once she is scheduled.  Per MD, injection will be held until dental procedure is completed.  Clearance sent to HIM to be scanned in.

## 2024-03-11 NOTE — Progress Notes (Incomplete)
 Histology and Location of Primary Cancer: Breast Cancer metastatic to bone  Location(s) of Symptomatic Metastases: Right Hip  Hip Xray 03/04/2024- Extensive mixed sclerotic and lucent lesions throughout the pelvis and proximal femurs, compatible with diffuse skeletal metastases from breast cancer.   Bone Marrow Aspirate 02/21/2024   Past/Anticipated chemotherapy by medical oncology, if any:  Dr. Odean 02/28/24 PET CT scan: 02/13/2024: Diffuse bone metastases, no evidence of extraosseous metastases Bone marrow biopsy 02/21/2024: Completely replaced with metastatic lobular breast cancer ER 100%, PR 80%, HER2 0 negative Guardant 360:  Current treatment: Anastrozole started 02/13/2024.  Plan to add Verzinio Evaluation for possible palliative radiation to the bone. Patient agreeable to the recommendation    Pain on a scale of 0-10 is:  10/10 weight bearing right hip.  Will take OTC ibuprofen  as needed.  Ambulatory status? Walker? Wheelchair?:  Independent  SAFETY ISSUES: Prior radiation? Yes, 10 years [facility]. Pacemaker/ICD? No Possible current pregnancy? Hysterectomy Is the patient on methotrexate? No  Current Complaints / other details: None

## 2024-03-12 ENCOUNTER — Ambulatory Visit
Admission: RE | Admit: 2024-03-12 | Discharge: 2024-03-12 | Disposition: A | Discharge status: Home / Self Care | Source: Ambulatory Visit | Attending: Radiation Oncology | Admitting: Radiation Oncology

## 2024-03-12 ENCOUNTER — Telehealth: Payer: Self-pay | Admitting: Radiation Oncology

## 2024-03-12 ENCOUNTER — Encounter: Payer: Self-pay | Admitting: Radiation Oncology

## 2024-03-12 VITALS — BP 136/83 | HR 91 | Temp 97.5°F | Resp 18 | Ht 67.0 in | Wt 144.1 lb

## 2024-03-12 DIAGNOSIS — Z79811 Long term (current) use of aromatase inhibitors: Secondary | ICD-10-CM | POA: Insufficient documentation

## 2024-03-12 DIAGNOSIS — D630 Anemia in neoplastic disease: Secondary | ICD-10-CM | POA: Insufficient documentation

## 2024-03-12 DIAGNOSIS — C50212 Malignant neoplasm of upper-inner quadrant of left female breast: Secondary | ICD-10-CM | POA: Diagnosis not present

## 2024-03-12 DIAGNOSIS — E785 Hyperlipidemia, unspecified: Secondary | ICD-10-CM | POA: Insufficient documentation

## 2024-03-12 DIAGNOSIS — M1990 Unspecified osteoarthritis, unspecified site: Secondary | ICD-10-CM | POA: Diagnosis not present

## 2024-03-12 DIAGNOSIS — Z803 Family history of malignant neoplasm of breast: Secondary | ICD-10-CM | POA: Insufficient documentation

## 2024-03-12 DIAGNOSIS — C7951 Secondary malignant neoplasm of bone: Secondary | ICD-10-CM

## 2024-03-12 DIAGNOSIS — Z170 Estrogen receptor positive status [ER+]: Secondary | ICD-10-CM | POA: Diagnosis not present

## 2024-03-12 DIAGNOSIS — Z809 Family history of malignant neoplasm, unspecified: Secondary | ICD-10-CM | POA: Insufficient documentation

## 2024-03-12 DIAGNOSIS — K219 Gastro-esophageal reflux disease without esophagitis: Secondary | ICD-10-CM | POA: Insufficient documentation

## 2024-03-12 DIAGNOSIS — Z800 Family history of malignant neoplasm of digestive organs: Secondary | ICD-10-CM | POA: Insufficient documentation

## 2024-03-12 DIAGNOSIS — Z1732 Human epidermal growth factor receptor 2 negative status: Secondary | ICD-10-CM | POA: Diagnosis not present

## 2024-03-12 DIAGNOSIS — Z923 Personal history of irradiation: Secondary | ICD-10-CM | POA: Insufficient documentation

## 2024-03-12 DIAGNOSIS — R232 Flushing: Secondary | ICD-10-CM | POA: Insufficient documentation

## 2024-03-12 DIAGNOSIS — F5104 Psychophysiologic insomnia: Secondary | ICD-10-CM | POA: Diagnosis not present

## 2024-03-12 DIAGNOSIS — Z1721 Progesterone receptor positive status: Secondary | ICD-10-CM | POA: Diagnosis not present

## 2024-03-12 NOTE — Telephone Encounter (Signed)
 11/12 Called Devan ([facility]-Radiology Dept) to pushed recent PET/CT images to powershare for [facility] as requested -per Donald P.

## 2024-03-12 NOTE — Telephone Encounter (Signed)
 I spoke with the patient today after discussing her case with Holley Peter, NP in the orthopedic oncology clinic at [facility]. She has reviewed the patient's imaging results and recommends another diagnostic x ray of the femurs bilaterally as it appears she might benefit from surgical intervention. Pam recommended that the patient avoid LLE weight bearing due to concerns of risk of femoral neck fracture. She recommended consultation in their clinic to discuss likely intramedullary nail for at least the left femur. I shared this information with the patient as well as the recommendation that Dr. Dewey would also give to treat sites of potential surgical intervention. The patient is willing to come for diagnostic x rays tomorrow prior to simulation and is open to proceeding as well with phone consultation with Holley.     Donald KYM Husband, PAC

## 2024-03-13 ENCOUNTER — Ambulatory Visit
Admission: RE | Admit: 2024-03-13 | Discharge: 2024-03-13 | Disposition: A | Discharge status: Home / Self Care | Source: Ambulatory Visit | Attending: Radiation Oncology | Admitting: Radiation Oncology

## 2024-03-13 ENCOUNTER — Ambulatory Visit (HOSPITAL_COMMUNITY)
Admission: RE | Admit: 2024-03-13 | Discharge: 2024-03-13 | Disposition: A | Discharge status: Home / Self Care | Source: Ambulatory Visit | Attending: Radiation Oncology | Admitting: Radiation Oncology

## 2024-03-13 ENCOUNTER — Telehealth: Payer: Self-pay | Admitting: Radiation Oncology

## 2024-03-13 DIAGNOSIS — Z170 Estrogen receptor positive status [ER+]: Secondary | ICD-10-CM | POA: Insufficient documentation

## 2024-03-13 DIAGNOSIS — C7951 Secondary malignant neoplasm of bone: Secondary | ICD-10-CM | POA: Insufficient documentation

## 2024-03-13 DIAGNOSIS — Z510 Encounter for antineoplastic radiation therapy: Secondary | ICD-10-CM | POA: Diagnosis present

## 2024-03-13 DIAGNOSIS — C50212 Malignant neoplasm of upper-inner quadrant of left female breast: Secondary | ICD-10-CM | POA: Insufficient documentation

## 2024-03-13 NOTE — Progress Notes (Signed)
 I spoke with the patient again this morning in simulation. She was able to go for her femur films today and has evidence of disease bilaterally with focal findings midshaft as well in the right femur. As the patient is going to be offered left intramedullary nailing of the left femoral neck by Ortho Oncology at [facility], Dr. Dewey would like to treat this site as well as the proximal and midshaft of the right femur at the same time as her sacrum and pelvis prior to surgical intervention. She will start radiation on 03/18/24 and finish on 04/02/24 followed by surgical intervention at [facility]. In the meantime she is aware to avoid left LE weight bearing. Updates to her consent were signed today.      Donald KYM Husband, PAC

## 2024-03-13 NOTE — Telephone Encounter (Signed)
 Called Dr. Sharrell team @ [facility]; spoke with another coordinator who verified referral was received. He advised he would be requesting images and pt would most likely be scheduled for 11/15. I advised I would c/b to verify appt is scheduled later this week, he verbalized understanding.

## 2024-03-17 ENCOUNTER — Ambulatory Visit

## 2024-03-17 ENCOUNTER — Telehealth: Payer: Self-pay | Admitting: Radiation Oncology

## 2024-03-17 DIAGNOSIS — Z510 Encounter for antineoplastic radiation therapy: Secondary | ICD-10-CM | POA: Diagnosis not present

## 2024-03-17 NOTE — Telephone Encounter (Signed)
 11/17 No images received to [facility] -per Donald P.  Follow up  call to Radiology dept, spoke to [place], all recent images pushed through again as requested.

## 2024-03-18 ENCOUNTER — Ambulatory Visit

## 2024-03-18 ENCOUNTER — Other Ambulatory Visit: Payer: Self-pay

## 2024-03-18 ENCOUNTER — Ambulatory Visit
Admission: RE | Admit: 2024-03-18 | Discharge: 2024-03-18 | Disposition: A | Discharge status: Home / Self Care | Source: Ambulatory Visit | Attending: Radiation Oncology | Admitting: Radiation Oncology

## 2024-03-18 DIAGNOSIS — Z510 Encounter for antineoplastic radiation therapy: Secondary | ICD-10-CM | POA: Diagnosis not present

## 2024-03-18 LAB — RAD ONC ARIA SESSION SUMMARY
Course Elapsed Days: 0
Plan Fractions Treated to Date: 1
Plan Fractions Treated to Date: 1
Plan Fractions Treated to Date: 1
Plan Prescribed Dose Per Fraction: 3 Gy
Plan Prescribed Dose Per Fraction: 3 Gy
Plan Prescribed Dose Per Fraction: 3 Gy
Plan Total Fractions Prescribed: 10
Plan Total Fractions Prescribed: 10
Plan Total Fractions Prescribed: 10
Plan Total Prescribed Dose: 30 Gy
Plan Total Prescribed Dose: 30 Gy
Plan Total Prescribed Dose: 30 Gy
Reference Point Dosage Given to Date: 3 Gy
Reference Point Dosage Given to Date: 3 Gy
Reference Point Dosage Given to Date: 3 Gy
Reference Point Session Dosage Given: 3 Gy
Reference Point Session Dosage Given: 3 Gy
Reference Point Session Dosage Given: 3 Gy
Session Number: 1

## 2024-03-19 ENCOUNTER — Ambulatory Visit

## 2024-03-19 ENCOUNTER — Other Ambulatory Visit: Payer: Self-pay

## 2024-03-19 ENCOUNTER — Ambulatory Visit
Admission: RE | Admit: 2024-03-19 | Discharge: 2024-03-19 | Disposition: A | Source: Ambulatory Visit | Attending: Radiation Oncology

## 2024-03-19 DIAGNOSIS — Z510 Encounter for antineoplastic radiation therapy: Secondary | ICD-10-CM | POA: Diagnosis not present

## 2024-03-19 LAB — RAD ONC ARIA SESSION SUMMARY
Course Elapsed Days: 1
Plan Fractions Treated to Date: 2
Plan Fractions Treated to Date: 2
Plan Fractions Treated to Date: 2
Plan Prescribed Dose Per Fraction: 3 Gy
Plan Prescribed Dose Per Fraction: 3 Gy
Plan Prescribed Dose Per Fraction: 3 Gy
Plan Total Fractions Prescribed: 10
Plan Total Fractions Prescribed: 10
Plan Total Fractions Prescribed: 10
Plan Total Prescribed Dose: 30 Gy
Plan Total Prescribed Dose: 30 Gy
Plan Total Prescribed Dose: 30 Gy
Reference Point Dosage Given to Date: 6 Gy
Reference Point Dosage Given to Date: 6 Gy
Reference Point Dosage Given to Date: 6 Gy
Reference Point Session Dosage Given: 3 Gy
Reference Point Session Dosage Given: 3 Gy
Reference Point Session Dosage Given: 3 Gy
Session Number: 2

## 2024-03-20 ENCOUNTER — Ambulatory Visit
Admission: RE | Admit: 2024-03-20 | Discharge: 2024-03-20 | Disposition: A | Discharge status: Home / Self Care | Source: Ambulatory Visit | Attending: Radiation Oncology | Admitting: Radiation Oncology

## 2024-03-20 ENCOUNTER — Other Ambulatory Visit: Payer: Self-pay

## 2024-03-20 ENCOUNTER — Ambulatory Visit

## 2024-03-20 DIAGNOSIS — Z510 Encounter for antineoplastic radiation therapy: Secondary | ICD-10-CM | POA: Diagnosis not present

## 2024-03-20 LAB — RAD ONC ARIA SESSION SUMMARY
Course Elapsed Days: 2
Plan Fractions Treated to Date: 3
Plan Fractions Treated to Date: 3
Plan Fractions Treated to Date: 3
Plan Prescribed Dose Per Fraction: 3 Gy
Plan Prescribed Dose Per Fraction: 3 Gy
Plan Prescribed Dose Per Fraction: 3 Gy
Plan Total Fractions Prescribed: 10
Plan Total Fractions Prescribed: 10
Plan Total Fractions Prescribed: 10
Plan Total Prescribed Dose: 30 Gy
Plan Total Prescribed Dose: 30 Gy
Plan Total Prescribed Dose: 30 Gy
Reference Point Dosage Given to Date: 9 Gy
Reference Point Dosage Given to Date: 9 Gy
Reference Point Dosage Given to Date: 9 Gy
Reference Point Session Dosage Given: 3 Gy
Reference Point Session Dosage Given: 3 Gy
Reference Point Session Dosage Given: 3 Gy
Session Number: 3

## 2024-03-21 ENCOUNTER — Ambulatory Visit
Admission: RE | Admit: 2024-03-21 | Discharge: 2024-03-21 | Disposition: A | Discharge status: Home / Self Care | Source: Ambulatory Visit | Attending: Radiation Oncology | Admitting: Radiation Oncology

## 2024-03-21 ENCOUNTER — Ambulatory Visit

## 2024-03-21 ENCOUNTER — Other Ambulatory Visit: Payer: Self-pay

## 2024-03-21 DIAGNOSIS — Z510 Encounter for antineoplastic radiation therapy: Secondary | ICD-10-CM | POA: Diagnosis not present

## 2024-03-21 LAB — RAD ONC ARIA SESSION SUMMARY
Course Elapsed Days: 3
Plan Fractions Treated to Date: 4
Plan Fractions Treated to Date: 4
Plan Fractions Treated to Date: 4
Plan Prescribed Dose Per Fraction: 3 Gy
Plan Prescribed Dose Per Fraction: 3 Gy
Plan Prescribed Dose Per Fraction: 3 Gy
Plan Total Fractions Prescribed: 10
Plan Total Fractions Prescribed: 10
Plan Total Fractions Prescribed: 10
Plan Total Prescribed Dose: 30 Gy
Plan Total Prescribed Dose: 30 Gy
Plan Total Prescribed Dose: 30 Gy
Reference Point Dosage Given to Date: 12 Gy
Reference Point Dosage Given to Date: 12 Gy
Reference Point Dosage Given to Date: 12 Gy
Reference Point Session Dosage Given: 3 Gy
Reference Point Session Dosage Given: 3 Gy
Reference Point Session Dosage Given: 3 Gy
Session Number: 4

## 2024-03-23 ENCOUNTER — Other Ambulatory Visit: Payer: Self-pay

## 2024-03-23 ENCOUNTER — Ambulatory Visit
Admission: RE | Admit: 2024-03-23 | Discharge: 2024-03-23 | Disposition: A | Source: Ambulatory Visit | Attending: Radiation Oncology

## 2024-03-23 DIAGNOSIS — Z510 Encounter for antineoplastic radiation therapy: Secondary | ICD-10-CM | POA: Diagnosis not present

## 2024-03-23 LAB — RAD ONC ARIA SESSION SUMMARY
Course Elapsed Days: 5
Plan Fractions Treated to Date: 5
Plan Fractions Treated to Date: 5
Plan Fractions Treated to Date: 5
Plan Prescribed Dose Per Fraction: 3 Gy
Plan Prescribed Dose Per Fraction: 3 Gy
Plan Prescribed Dose Per Fraction: 3 Gy
Plan Total Fractions Prescribed: 10
Plan Total Fractions Prescribed: 10
Plan Total Fractions Prescribed: 10
Plan Total Prescribed Dose: 30 Gy
Plan Total Prescribed Dose: 30 Gy
Plan Total Prescribed Dose: 30 Gy
Reference Point Dosage Given to Date: 15 Gy
Reference Point Dosage Given to Date: 15 Gy
Reference Point Dosage Given to Date: 15 Gy
Reference Point Session Dosage Given: 3 Gy
Reference Point Session Dosage Given: 3 Gy
Reference Point Session Dosage Given: 3 Gy
Session Number: 5

## 2024-03-24 ENCOUNTER — Ambulatory Visit
Admission: RE | Admit: 2024-03-24 | Discharge: 2024-03-24 | Disposition: A | Discharge status: Home / Self Care | Source: Ambulatory Visit | Attending: Radiation Oncology | Admitting: Radiation Oncology

## 2024-03-24 ENCOUNTER — Other Ambulatory Visit: Payer: Self-pay

## 2024-03-24 ENCOUNTER — Telehealth: Payer: Self-pay | Admitting: Radiation Oncology

## 2024-03-24 ENCOUNTER — Inpatient Hospital Stay: Admitting: Hematology and Oncology

## 2024-03-24 ENCOUNTER — Ambulatory Visit

## 2024-03-24 ENCOUNTER — Inpatient Hospital Stay

## 2024-03-24 VITALS — BP 128/59 | HR 96 | Temp 98.3°F | Resp 16 | Ht 67.0 in | Wt 146.3 lb

## 2024-03-24 DIAGNOSIS — Z510 Encounter for antineoplastic radiation therapy: Secondary | ICD-10-CM | POA: Diagnosis not present

## 2024-03-24 DIAGNOSIS — C50212 Malignant neoplasm of upper-inner quadrant of left female breast: Secondary | ICD-10-CM | POA: Diagnosis not present

## 2024-03-24 DIAGNOSIS — Z170 Estrogen receptor positive status [ER+]: Secondary | ICD-10-CM

## 2024-03-24 LAB — RAD ONC ARIA SESSION SUMMARY
Course Elapsed Days: 6
Plan Fractions Treated to Date: 6
Plan Fractions Treated to Date: 6
Plan Fractions Treated to Date: 6
Plan Prescribed Dose Per Fraction: 3 Gy
Plan Prescribed Dose Per Fraction: 3 Gy
Plan Prescribed Dose Per Fraction: 3 Gy
Plan Total Fractions Prescribed: 10
Plan Total Fractions Prescribed: 10
Plan Total Fractions Prescribed: 10
Plan Total Prescribed Dose: 30 Gy
Plan Total Prescribed Dose: 30 Gy
Plan Total Prescribed Dose: 30 Gy
Reference Point Dosage Given to Date: 18 Gy
Reference Point Dosage Given to Date: 18 Gy
Reference Point Dosage Given to Date: 18 Gy
Reference Point Session Dosage Given: 3 Gy
Reference Point Session Dosage Given: 3 Gy
Reference Point Session Dosage Given: 3 Gy
Session Number: 6

## 2024-03-24 LAB — CBC WITH DIFFERENTIAL (CANCER CENTER ONLY)
Abs Immature Granulocytes: 0.01 K/uL (ref 0.00–0.07)
Basophils Absolute: 0 K/uL (ref 0.0–0.1)
Basophils Relative: 1 %
Eosinophils Absolute: 0.1 K/uL (ref 0.0–0.5)
Eosinophils Relative: 3 %
HCT: 31.4 % — ABNORMAL LOW (ref 36.0–46.0)
Hemoglobin: 10.6 g/dL — ABNORMAL LOW (ref 12.0–15.0)
Immature Granulocytes: 0 %
Lymphocytes Relative: 31 %
Lymphs Abs: 0.7 K/uL (ref 0.7–4.0)
MCH: 30.8 pg (ref 26.0–34.0)
MCHC: 33.8 g/dL (ref 30.0–36.0)
MCV: 91.3 fL (ref 80.0–100.0)
Monocytes Absolute: 0.1 K/uL (ref 0.1–1.0)
Monocytes Relative: 5 %
Neutro Abs: 1.4 K/uL — ABNORMAL LOW (ref 1.7–7.7)
Neutrophils Relative %: 60 %
Platelet Count: 221 K/uL (ref 150–400)
RBC: 3.44 MIL/uL — ABNORMAL LOW (ref 3.87–5.11)
RDW: 18.5 % — ABNORMAL HIGH (ref 11.5–15.5)
WBC Count: 2.4 K/uL — ABNORMAL LOW (ref 4.0–10.5)
nRBC: 0 % (ref 0.0–0.2)

## 2024-03-24 LAB — CMP (CANCER CENTER ONLY)
ALT: 18 U/L (ref 0–44)
AST: 23 U/L (ref 15–41)
Albumin: 4.3 g/dL (ref 3.5–5.0)
Alkaline Phosphatase: 236 U/L — ABNORMAL HIGH (ref 38–126)
Anion gap: 12 (ref 5–15)
BUN: 13 mg/dL (ref 8–23)
CO2: 24 mmol/L (ref 22–32)
Calcium: 8.4 mg/dL — ABNORMAL LOW (ref 8.9–10.3)
Chloride: 103 mmol/L (ref 98–111)
Creatinine: 0.94 mg/dL (ref 0.44–1.00)
GFR, Estimated: >60 mL/min (ref >60)
Glucose, Bld: 140 mg/dL — ABNORMAL HIGH (ref 70–99)
Potassium: 3.7 mmol/L (ref 3.5–5.1)
Sodium: 138 mmol/L (ref 135–145)
Total Bilirubin: 0.5 mg/dL (ref 0.0–1.2)
Total Protein: 7.2 g/dL (ref 6.5–8.1)

## 2024-03-24 NOTE — Assessment & Plan Note (Signed)
 01/18/2014: ILC ER 70% PR 70% Ki67 7% HER2 negative (patient was seen by Dr. 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 Scans to be done in February 2025.

## 2024-03-24 NOTE — Telephone Encounter (Signed)
 Contacted Dr. Sharrell office, referral coordinator advised it took several attempts to contact pt successfully. Pt is now scheduled for consult 12/3 @ 2:45pm with NP Pam. Notes will be sent once consult complete.

## 2024-03-24 NOTE — Progress Notes (Signed)
 Patient Care Team: Catherine Charlies LABOR, DO as PCP - General (Family Medicine) Latisha Medford, MD as Consulting Physician (Obstetrics and Gynecology) Leigh, Elspeth SQUIBB, MD as Consulting Physician (Gastroenterology) Dartha Ernst, MD as Consulting Physician (Endocrinology) Delane Lye, MD as Referring Physician (Orthopedic Surgery) Odean Potts, MD as Medical Oncologist (Hematology and Oncology)  DIAGNOSIS:  Encounter Diagnosis  Name Primary?   Malignant neoplasm of upper-inner quadrant of left breast in female, estrogen receptor positive (HCC) Yes     CHIEF COMPLIANT: Follow up of metastatic breast cancer on Verzenio  and letrozole  HISTORY OF PRESENT ILLNESS:   History of Present Illness [name] [name] is a 63 year old female with metastatic breast cancer who presents for follow-up on her current treatment regimen.  She has been on letrozole and Verzenio  for almost three weeks. Tumor markers show a decrease in CA 15-3 from 1500 to 1297 and CA 27-29 from 1466 to 1138 over two weeks. Her white blood cell count is 2.4, with neutrophils just above 1.0. Hemoglobin has increased from 10 to 10.6.  She experiences diarrhea approximately twice a week, starting about ten days ago. Her appetite has decreased, with a lack of taste for food despite feeling hungry.  Radiation therapy was performed to alleviate hip pain, which has subsided, though some tenderness remains. A CT scan of her femurs was performed, with concern noted about the left femur.  She has a genetic counseling appointment scheduled to assess her predisposition to other cancers, considering her family history of breast cancer and non-Hodgkin's lymphoma in her mother.     ALLERGIES:  has no known allergies.  MEDICATIONS:  Current Outpatient Medications  Medication Sig Dispense Refill   anastrozole  (ARIMIDEX ) 1 MG tablet Take 1 tablet (1 mg total) by mouth daily. 90 tablet 3   Cholecalciferol (VITAMIN D3) 125 MCG  (5000 UT) TABS Take by mouth daily with supper.     levothyroxine  (SYNTHROID ) 100 MCG tablet Take 1 tablet (100 mcg total) by mouth daily before breakfast. 90 tablet 3   MAGNESIUM GLYCINATE PO Take 1 capsule by mouth 4 (four) times a week. At bedtime.     Menaquinone-7 (K2 PO) Take 1 capsule by mouth daily with supper. MK-7 Vitamin K-2     Vitamins A & D (VITAMIN A & D) 10000-400 units TABS Take 1 tablet by mouth daily with supper. Now Ultra A & D-3     abemaciclib  (VERZENIO ) 100 MG tablet Take 1 tablet (100 mg total) by mouth 2 (two) times daily. (Patient not taking: Reported on 03/24/2024) 60 tablet 3   HYDROcodone -acetaminophen  (NORCO/VICODIN) 5-325 MG tablet 1 tablet. 5 mg (Patient not taking: Reported on 03/24/2024)     No current facility-administered medications for this visit.    PHYSICAL EXAMINATION: ECOG PERFORMANCE STATUS: 1 - Symptomatic but completely ambulatory  Vitals:   03/24/24 1426  BP: (!) 128/59  Pulse: 96  Resp: 16  Temp: 98.3 F (36.8 C)  SpO2: 100%   Filed Weights   03/24/24 1426  Weight: 146 lb 4.8 oz (66.4 kg)      LABORATORY DATA:  I have reviewed the data as listed    Latest Ref Rng & Units 03/05/2024    1:22 PM 01/28/2024    9:25 AM 07/19/2023    9:49 AM  CMP  Glucose 70 - 99 mg/dL 893  95  96   BUN 8 - 23 mg/dL 21  14  16    Creatinine 0.44 - 1.00 mg/dL 8.80  9.09  0.93   Sodium 135 - 145 mmol/L 140  138  138   Potassium 3.5 - 5.1 mmol/L 4.3  4.3  4.9   Chloride 98 - 111 mmol/L 104  101  104   CO2 22 - 32 mmol/L 28  27  27    Calcium  8.9 - 10.3 mg/dL 9.0  9.3    9.0  9.0   Total Protein 6.5 - 8.1 g/dL 7.4  6.8    Total Bilirubin 0.0 - 1.2 mg/dL 0.6  0.4    Alkaline Phos 38 - 126 U/L 180  130    AST 15 - 41 U/L 21  37    ALT 0 - 44 U/L 19  26      Lab Results  Component Value Date   WBC 2.4 (L) 03/24/2024   HGB 10.6 (L) 03/24/2024   HCT 31.4 (L) 03/24/2024   MCV 91.3 03/24/2024   PLT 221 03/24/2024   NEUTROABS 1.4 (L) 03/24/2024     ASSESSMENT & PLAN:  Malignant neoplasm of upper-inner quadrant of left breast in female, estrogen receptor positive (HCC) 01/18/2014: ILC ER 70% PR 70% Ki67 7% HER2 negative (patient was seen by Dr. 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 Tumor markers are improving Scans to be done in February 2025. I will see her back in January for a follow-up along with her genetics appointment Patient has been referred to [facility] orthopedics for hip surgery.  She is not interested in surgery at this time.  She is likely to watch and monitor.  Currently undergoing palliative radiation to the hips: Marked improvement in symptoms.    No orders of the defined types were placed in this encounter.  The patient  has a good understanding of the overall plan. she agrees with it. she will call with any problems that may develop before the next visit here.  I personally spent a total of 30 minutes in the care of the patient today including preparing to see the patient, getting/reviewing separately obtained history, performing a medically appropriate exam/evaluation, counseling and educating, placing orders, referring and communicating with other health care professionals, documenting clinical information in the EHR, independently interpreting results, communicating results, and coordinating care.   Viinay K Melvine Julin, MD 03/24/24

## 2024-03-25 ENCOUNTER — Other Ambulatory Visit: Payer: Self-pay

## 2024-03-25 ENCOUNTER — Ambulatory Visit

## 2024-03-25 ENCOUNTER — Ambulatory Visit
Admission: RE | Admit: 2024-03-25 | Discharge: 2024-03-25 | Disposition: A | Source: Ambulatory Visit | Attending: Radiation Oncology

## 2024-03-25 DIAGNOSIS — Z510 Encounter for antineoplastic radiation therapy: Secondary | ICD-10-CM | POA: Diagnosis not present

## 2024-03-25 LAB — RAD ONC ARIA SESSION SUMMARY
Course Elapsed Days: 7
Plan Fractions Treated to Date: 7
Plan Fractions Treated to Date: 7
Plan Fractions Treated to Date: 7
Plan Prescribed Dose Per Fraction: 3 Gy
Plan Prescribed Dose Per Fraction: 3 Gy
Plan Prescribed Dose Per Fraction: 3 Gy
Plan Total Fractions Prescribed: 10
Plan Total Fractions Prescribed: 10
Plan Total Fractions Prescribed: 10
Plan Total Prescribed Dose: 30 Gy
Plan Total Prescribed Dose: 30 Gy
Plan Total Prescribed Dose: 30 Gy
Reference Point Dosage Given to Date: 21 Gy
Reference Point Dosage Given to Date: 21 Gy
Reference Point Dosage Given to Date: 21 Gy
Reference Point Session Dosage Given: 3 Gy
Reference Point Session Dosage Given: 3 Gy
Reference Point Session Dosage Given: 3 Gy
Session Number: 7

## 2024-03-25 LAB — CANCER ANTIGEN 15-3: CA 15-3: 1351 U/mL — ABNORMAL HIGH (ref 0.0–25.0)

## 2024-03-25 LAB — CANCER ANTIGEN 27.29: CA 27.29: 1125.3 U/mL — ABNORMAL HIGH (ref 0.0–38.6)

## 2024-03-25 NOTE — Progress Notes (Signed)
 Specialty Pharmacy Refill Coordination Note  [name] [name] is a 63 y.o. female contacted today regarding refills of specialty medication(s) Abemaciclib  (VERZENIO )   Patient requested Delivery   Delivery date: 04/02/24   Verified address: [address] [place] [address]   Medication will be filled on: 04/01/24

## 2024-03-25 NOTE — Progress Notes (Signed)
 Specialty Pharmacy Ongoing Clinical Assessment Note  [name] [name] is a 63 y.o. female who is being followed by the specialty pharmacy service for RxSp Oncology   Patient's specialty medication(s) reviewed today: Abemaciclib  (VERZENIO )   Missed doses in the last 4 weeks: 0   Patient/Caregiver did not have any additional questions or concerns.   Therapeutic benefit summary: Unable to assess   Adverse events/side effects summary: Experienced adverse events/side effects (Patient reports diarrhea which she treats with Imodium and fatigue, both remain tolerable at this time)   Patient's therapy is appropriate to: Continue    Goals Addressed             This Visit's Progress    Maintain optimal adherence to therapy   On track    Patient is on track. Patient will maintain adherence         Follow up: 3 months  Silvano LOISE Dolly Specialty Pharmacist

## 2024-03-26 ENCOUNTER — Ambulatory Visit
Admission: RE | Admit: 2024-03-26 | Discharge: 2024-03-26 | Disposition: A | Discharge status: Home / Self Care | Source: Ambulatory Visit | Attending: Radiation Oncology | Admitting: Radiation Oncology

## 2024-03-26 ENCOUNTER — Other Ambulatory Visit: Payer: Self-pay

## 2024-03-26 ENCOUNTER — Ambulatory Visit

## 2024-03-26 DIAGNOSIS — Z510 Encounter for antineoplastic radiation therapy: Secondary | ICD-10-CM | POA: Diagnosis not present

## 2024-03-26 LAB — RAD ONC ARIA SESSION SUMMARY
Course Elapsed Days: 8
Plan Fractions Treated to Date: 8
Plan Fractions Treated to Date: 8
Plan Fractions Treated to Date: 8
Plan Prescribed Dose Per Fraction: 3 Gy
Plan Prescribed Dose Per Fraction: 3 Gy
Plan Prescribed Dose Per Fraction: 3 Gy
Plan Total Fractions Prescribed: 10
Plan Total Fractions Prescribed: 10
Plan Total Fractions Prescribed: 10
Plan Total Prescribed Dose: 30 Gy
Plan Total Prescribed Dose: 30 Gy
Plan Total Prescribed Dose: 30 Gy
Reference Point Dosage Given to Date: 24 Gy
Reference Point Dosage Given to Date: 24 Gy
Reference Point Dosage Given to Date: 24 Gy
Reference Point Session Dosage Given: 3 Gy
Reference Point Session Dosage Given: 3 Gy
Reference Point Session Dosage Given: 3 Gy
Session Number: 8

## 2024-03-31 ENCOUNTER — Ambulatory Visit

## 2024-03-31 ENCOUNTER — Ambulatory Visit
Admission: RE | Admit: 2024-03-31 | Discharge: 2024-03-31 | Disposition: A | Source: Ambulatory Visit | Attending: Radiation Oncology

## 2024-03-31 ENCOUNTER — Other Ambulatory Visit: Payer: Self-pay

## 2024-03-31 DIAGNOSIS — C50212 Malignant neoplasm of upper-inner quadrant of left female breast: Secondary | ICD-10-CM | POA: Insufficient documentation

## 2024-03-31 DIAGNOSIS — Z510 Encounter for antineoplastic radiation therapy: Secondary | ICD-10-CM | POA: Diagnosis present

## 2024-03-31 DIAGNOSIS — C7951 Secondary malignant neoplasm of bone: Secondary | ICD-10-CM | POA: Insufficient documentation

## 2024-03-31 LAB — RAD ONC ARIA SESSION SUMMARY
Course Elapsed Days: 13
Plan Fractions Treated to Date: 9
Plan Fractions Treated to Date: 9
Plan Fractions Treated to Date: 9
Plan Prescribed Dose Per Fraction: 3 Gy
Plan Prescribed Dose Per Fraction: 3 Gy
Plan Prescribed Dose Per Fraction: 3 Gy
Plan Total Fractions Prescribed: 10
Plan Total Fractions Prescribed: 10
Plan Total Fractions Prescribed: 10
Plan Total Prescribed Dose: 30 Gy
Plan Total Prescribed Dose: 30 Gy
Plan Total Prescribed Dose: 30 Gy
Reference Point Dosage Given to Date: 27 Gy
Reference Point Dosage Given to Date: 27 Gy
Reference Point Dosage Given to Date: 27 Gy
Reference Point Session Dosage Given: 3 Gy
Reference Point Session Dosage Given: 3 Gy
Reference Point Session Dosage Given: 3 Gy
Session Number: 9

## 2024-04-01 ENCOUNTER — Other Ambulatory Visit: Payer: Self-pay

## 2024-04-01 ENCOUNTER — Ambulatory Visit

## 2024-04-01 ENCOUNTER — Ambulatory Visit
Admission: RE | Admit: 2024-04-01 | Discharge: 2024-04-01 | Disposition: A | Source: Ambulatory Visit | Attending: Radiation Oncology

## 2024-04-01 DIAGNOSIS — Z510 Encounter for antineoplastic radiation therapy: Secondary | ICD-10-CM | POA: Diagnosis not present

## 2024-04-01 LAB — RAD ONC ARIA SESSION SUMMARY
Course Elapsed Days: 14
Plan Fractions Treated to Date: 10
Plan Fractions Treated to Date: 10
Plan Fractions Treated to Date: 10
Plan Prescribed Dose Per Fraction: 3 Gy
Plan Prescribed Dose Per Fraction: 3 Gy
Plan Prescribed Dose Per Fraction: 3 Gy
Plan Total Fractions Prescribed: 10
Plan Total Fractions Prescribed: 10
Plan Total Fractions Prescribed: 10
Plan Total Prescribed Dose: 30 Gy
Plan Total Prescribed Dose: 30 Gy
Plan Total Prescribed Dose: 30 Gy
Reference Point Dosage Given to Date: 30 Gy
Reference Point Dosage Given to Date: 30 Gy
Reference Point Dosage Given to Date: 30 Gy
Reference Point Session Dosage Given: 3 Gy
Reference Point Session Dosage Given: 3 Gy
Reference Point Session Dosage Given: 3 Gy
Session Number: 10

## 2024-04-02 ENCOUNTER — Ambulatory Visit

## 2024-04-02 NOTE — Radiation Completion Notes (Addendum)
" °  Radiation Oncology         [phone]) [phone] ________________________________  Name: [name] [name] MRN: [id number]  Date of Service: 04/01/2024  DOB: February 08, 1961  End of Treatment Note  Diagnosis:  Recurrent Metastatic ER/PR positive lobular carcinoma of the breast involving the bone.   Intent: Palliative     ==========DELIVERED PLANS==========  First Treatment Date: 2024-03-18 Last Treatment Date: 2024-04-01   Plan Name: Pelvis_Sacrum Site: Sacrum Technique: Isodose Plan Mode: Photon Dose Per Fraction: 3 Gy Prescribed Dose (Delivered / Prescribed): 30 Gy / 30 Gy Prescribed Fxs (Delivered / Prescribed): 10 / 10   Plan Name: Ext_R_Femur Site: Femur Right Technique: Isodose Plan Mode: Photon Dose Per Fraction: 3 Gy Prescribed Dose (Delivered / Prescribed): 30 Gy / 30 Gy Prescribed Fxs (Delivered / Prescribed): 10 / 10   Plan Name: Ext_L_Femur Site: Femur Left Technique: Isodose Plan Mode: Photon Dose Per Fraction: 3 Gy Prescribed Dose (Delivered / Prescribed): 30 Gy / 30 Gy Prescribed Fxs (Delivered / Prescribed): 10 / 10     ==========ON TREATMENT VISIT DATES========== 2024-03-20, 2024-03-26   See weekly On Treatment Notes in Epic for details in the Media tab (listed as Progress notes on the On Treatment Visit Dates listed above).The patient tolerated radiation. She developed fatigue and anticipated skin changes in the treatment field.   The patient will receive a call in about one month from the radiation oncology department. She will continue follow up with Dr. Gudena as well as with orthopedic oncology as needed at [facility].     Donald KYM Husband, PAC     "

## 2024-04-03 ENCOUNTER — Other Ambulatory Visit: Payer: Self-pay | Admitting: Pharmacist

## 2024-04-03 ENCOUNTER — Encounter: Payer: Self-pay | Admitting: Pharmacist

## 2024-04-03 MED ORDER — ONDANSETRON HCL 8 MG PO TABS: 8.0000 mg | ORAL_TABLET | Freq: Three times a day (TID) | ORAL | 2 refills | Status: DC | PRN

## 2024-04-07 NOTE — Progress Notes (Unsigned)
 [facility]        Telephone: [phone]?Fax: [phone]   Oncology Clinical Pharmacist Practitioner Progress Note   [name] [name] was contacted via in-person to discuss her chemotherapy regimen for abemaciclib  which they receive under the care of Dr. Mackey Chad.  Current treatment regimen and start date Abemaciclib  (03/08/24) Anastrozole  02/13/24 Zoledronic  acid (03/05/24)  Interval History She continues on abemaciclib  100 mg by mouth every 12 hours on days 1 to 28 of a 28-day cycle. This is being given in combination with anastrozole  and zoledronic  acid. Therapy is planned to continue until disease progression or unacceptable toxicity. She last saw clinical pharmacy on 03/05/24 and Dr. Chad on 03/24/24. She contacted the clinic on 04/03/24 which increased nausea and decreased appetite. We sent ondansetron  to her local pharmacy of choice.  Response to Therapy The nausea has improved on ondansetron . She is still having some loose stools and we discussed scheduling loperamide, 1 tablet daily, in the morning and then using more PRN throughout the day which she will try.   Her calcium  and potassium are lower today and we will repeat labs in two weeks. In the meantime, she will start taking her vitamin D  supplement more consistently. She was to be taking it daily but had not and she prefers TUMS for calcium  supplementation. We also gave information on potassium rich foods today.  She is not interested in a nutrition consult currently but will let us  know if this changes. Alk Phos is trending down. ANC is improving, and Hgb is slightly lower than last visit.  Labs, vitals, treatment parameters, and manufacturer guidelines assessing toxicity were reviewed with [name] [name] today. Based on these values, patient is in agreement to continue abemaciclib  therapy at this time.  Allergies No Known Allergies  Vitals    04/08/2024   11:18 AM 03/24/2024    2:26 PM 03/12/2024     8:38 AM  Oncology Vitals  Height  170 cm 170 cm  Weight 64.638 kg 66.361 kg 65.375 kg  Weight (lbs) 142 lbs 8 oz 146 lbs 5 oz 144 lbs 2 oz  BMI 22.32 kg/m2 22.91 kg/m2 22.57 kg/m2  Temp 98.2 F (36.8 C) 98.3 F (36.8 C) 97.5 F (36.4 C)  Pulse Rate 85 96 91  BP 119/64 128/59 136/83  Resp 17 16 18   SpO2 100 % 100 % 100 %  BSA (m2) 1.75 m2 1.77 m2 1.76 m2    Laboratory Data    Latest Ref Rng & Units 04/08/2024   10:44 AM 03/24/2024    2:13 PM 03/05/2024    1:22 PM  CBC EXTENDED  WBC 4.0 - 10.5 K/uL 2.3  2.4  4.5   RBC 3.87 - 5.11 MIL/uL 3.20  3.44  3.33   Hemoglobin 12.0 - 15.0 g/dL 89.9  89.3  89.9   HCT 36.0 - 46.0 % 29.9  31.4  30.3   Platelets 150 - 400 K/uL 296  221  244   NEUT# 1.7 - 7.7 K/uL 1.6  1.4  2.5   Lymph# 0.7 - 4.0 K/uL 0.4  0.7  1.3        Latest Ref Rng & Units 04/08/2024   10:44 AM 03/24/2024    2:13 PM 03/05/2024    1:22 PM  CMP  Glucose 70 - 99 mg/dL 95  859  893   BUN 8 - 23 mg/dL 12  13  21    Creatinine 0.44 - 1.00 mg/dL 9.14  9.05  1.19   Sodium 135 - 145 mmol/L 140  138  140   Potassium 3.5 - 5.1 mmol/L 3.3  3.7  4.3   Chloride 98 - 111 mmol/L 105  103  104   CO2 22 - 32 mmol/L 25  24  28    Calcium  8.9 - 10.3 mg/dL 7.7  8.4  9.0   Total Protein 6.5 - 8.1 g/dL 6.5  7.2  7.4   Total Bilirubin 0.0 - 1.2 mg/dL 0.3  0.5  0.6   Alkaline Phos 38 - 126 U/L 137  236  180   AST 15 - 41 U/L 20  23  21    ALT 0 - 44 U/L 17  18  19      No results found for: MG Lab Results  Component Value Date   CA2729 1,125.3 (H) 03/24/2024   CA2729 1,138.7 (H) 03/05/2024   [id number] 1,466.2 (H) 02/07/2024     Adverse Effects Assessment ANC: 1600 (last visit 1400): Recheck 2 weeks Calcium : decreased. She will start taking her vitamin D  daily. She will also supplement with TUMS (pt preference). Recheck 2 weeks Potassium: decreased. Discussed potassium rich foods and patient handout given. Recheck 2 weeks.  Adherence Assessment [name] [name] reports missing 1  doses over the past 2 weeks.   Reason for missed dose: slept late Patient was re-educated on importance of adherence.   Access Assessment [name] [name] is currently receiving her abemaciclib  through [facility] concerns:  none  Medication Reconciliation The patient's medication list was reviewed today with the patient? Yes New medications or herbal supplements have recently been started? No  Any medications have been discontinued? No  The medication list was updated and reconciled based on the patient's most recent medication list in the electronic medical record (EMR) including herbal products and OTC medications.   Medications Current Outpatient Medications  Medication Sig Dispense Refill   Menaquinone-7 (K2 PO) Take 1 capsule by mouth daily with supper. MK-7 Vitamin K-2     Vitamins A & D (VITAMIN A & D) 10000-400 units TABS Take 1 tablet by mouth daily with supper. Now Ultra A & D-3     abemaciclib  (VERZENIO ) 100 MG tablet Take 1 tablet (100 mg total) by mouth 2 (two) times daily. (Patient not taking: Reported on 03/24/2024) 60 tablet 3   anastrozole  (ARIMIDEX ) 1 MG tablet Take 1 tablet (1 mg total) by mouth daily. 90 tablet 3   Cholecalciferol (VITAMIN D3) 125 MCG (5000 UT) TABS Take by mouth daily with supper.     HYDROcodone -acetaminophen  (NORCO/VICODIN) 5-325 MG tablet 1 tablet. 5 mg (Patient not taking: Reported on 03/24/2024)     levothyroxine  (SYNTHROID ) 100 MCG tablet Take 1 tablet (100 mcg total) by mouth daily before breakfast. 90 tablet 3   MAGNESIUM GLYCINATE PO Take 1 capsule by mouth 4 (four) times a week. At bedtime.     ondansetron  (ZOFRAN ) 8 MG tablet Take 1 tablet (8 mg total) by mouth every 8 (eight) hours as needed for nausea or vomiting. 30 tablet 2   No current facility-administered medications for this visit.   Drug-Drug Interactions (DDIs) DDIs were evaluated? Yes Significant DDIs? No  The patient was instructed to speak with  their health care provider and/or the oral chemotherapy pharmacist before starting any new drug, including prescription or over the counter, natural / herbal products, or vitamins.  Supportive Care Diarrhea: we reviewed that diarrhea is common with abemaciclib  and confirmed that she does  have loperamide (Imodium) at home.  We reviewed how to take this medication PRN. Neutropenia: we discussed the importance of having a thermometer and what the Centers for Disease Control and Prevention (CDC) considers a fever which is 100.66F (38C) or higher.  Gave patient 24/7 triage line to call if any fevers or symptoms. ILD/Pneumonitis: we reviewed potential symptoms including cough, shortness, and fatigue.  VTE: reviewed signs of DVT such as leg swelling, redness, pain, or tenderness and signs of PE such as shortness of breath, rapid or irregular heartbeat, cough, chest pain, or lightheadedness. Reviewed to take the medication every 12 hours (with food sometimes can be easier on the stomach) and to take it at the same time every day. Drug interactions with grapefruit products  Dosing Assessment Hepatic adjustments needed? No  Renal adjustments needed? No  Toxicity adjustments needed? No  The current dosing regimen is appropriate to continue at this time.  Follow-Up Plan Continue abemaciclib  100 by mouth every 12 hours Continue anastrozole  1 mg by mouth daily Continue zoledronic  acid 3.3 mg IV every 12 weeks. Last given 03/05/24, next due 05/28/24 Will add labs, pharmacy clinic visit in 2 weeks to recheck Hgb, ANC, Calcium , and Potassium Will add labs, pharmacy clinic  visit, and zoledronic  acid on 05/28/24 Labs. Dr. Odean visit scheduled for 05/06/24 [name] [name] can follow up with clinical pharmacy as deemed necessary by Dr. Mackey Odean going forward   [name] [name] participated in the discussion, expressed understanding, and voiced agreement with the above plan. All questions were answered to her  satisfaction. The patient was advised to contact the clinic at [phone] with any questions or concerns prior to her return visit.   I spent 30 minutes assessing and educating the patient.  Shykeria Sakamoto A. Lucila, PharmD, BCOP, CPP  Norleen DELENA Lucila, RPH-CPP, 04/08/2024  11:37 AM   **Disclaimer: This note was dictated with voice recognition software. Similar sounding words can inadvertently be transcribed and this note may contain transcription errors which may not have been corrected upon publication of note.**

## 2024-04-08 ENCOUNTER — Inpatient Hospital Stay: Attending: Hematology and Oncology

## 2024-04-08 ENCOUNTER — Inpatient Hospital Stay: Admitting: Pharmacist

## 2024-04-08 VITALS — BP 119/64 | HR 85 | Temp 98.2°F | Resp 17 | Wt 142.5 lb

## 2024-04-08 DIAGNOSIS — C50212 Malignant neoplasm of upper-inner quadrant of left female breast: Secondary | ICD-10-CM | POA: Diagnosis present

## 2024-04-08 LAB — CBC WITH DIFFERENTIAL (CANCER CENTER ONLY)
Abs Immature Granulocytes: 0.02 K/uL (ref 0.00–0.07)
Basophils Absolute: 0 K/uL (ref 0.0–0.1)
Basophils Relative: 2 %
Eosinophils Absolute: 0.2 K/uL (ref 0.0–0.5)
Eosinophils Relative: 6 %
HCT: 29.9 % — ABNORMAL LOW (ref 36.0–46.0)
Hemoglobin: 10 g/dL — ABNORMAL LOW (ref 12.0–15.0)
Immature Granulocytes: 1 %
Lymphocytes Relative: 15 %
Lymphs Abs: 0.4 K/uL — ABNORMAL LOW (ref 0.7–4.0)
MCH: 31.3 pg (ref 26.0–34.0)
MCHC: 33.4 g/dL (ref 30.0–36.0)
MCV: 93.4 fL (ref 80.0–100.0)
Monocytes Absolute: 0.2 K/uL (ref 0.1–1.0)
Monocytes Relative: 9 %
Neutro Abs: 1.6 K/uL — ABNORMAL LOW (ref 1.7–7.7)
Neutrophils Relative %: 67 %
Platelet Count: 296 K/uL (ref 150–400)
RBC: 3.2 MIL/uL — ABNORMAL LOW (ref 3.87–5.11)
RDW: 19.2 % — ABNORMAL HIGH (ref 11.5–15.5)
WBC Count: 2.3 K/uL — ABNORMAL LOW (ref 4.0–10.5)
nRBC: 0 % (ref 0.0–0.2)

## 2024-04-08 LAB — CMP (CANCER CENTER ONLY)
ALT: 17 U/L (ref 0–44)
AST: 20 U/L (ref 15–41)
Albumin: 3.8 g/dL (ref 3.5–5.0)
Alkaline Phosphatase: 137 U/L — ABNORMAL HIGH (ref 38–126)
Anion gap: 10 (ref 5–15)
BUN: 12 mg/dL (ref 8–23)
CO2: 25 mmol/L (ref 22–32)
Calcium: 7.7 mg/dL — ABNORMAL LOW (ref 8.9–10.3)
Chloride: 105 mmol/L (ref 98–111)
Creatinine: 0.85 mg/dL (ref 0.44–1.00)
GFR, Estimated: >60 mL/min (ref >60)
Glucose, Bld: 95 mg/dL (ref 70–99)
Potassium: 3.3 mmol/L — ABNORMAL LOW (ref 3.5–5.1)
Sodium: 140 mmol/L (ref 135–145)
Total Bilirubin: 0.3 mg/dL (ref 0.0–1.2)
Total Protein: 6.5 g/dL (ref 6.5–8.1)

## 2024-04-09 LAB — CANCER ANTIGEN 15-3: CA 15-3: 756 U/mL — ABNORMAL HIGH (ref 0.0–25.0)

## 2024-04-09 LAB — CANCER ANTIGEN 27.29: CA 27.29: 561.5 U/mL — ABNORMAL HIGH (ref 0.0–38.6)

## 2024-04-17 NOTE — Progress Notes (Unsigned)
 [facility]        Telephone: [phone]?Fax: [phone]   Oncology Clinical Pharmacist Practitioner Progress Note   [name] [name] was contacted via in-person to discuss her chemotherapy regimen for abemaciclib  which they receive under the care of Dr. Vinay Gudena.  Current treatment regimen and start date Abemaciclib  (03/08/24) Anastrozole  02/13/24 Zoledronic  acid (03/05/24)  Interval History She continues on abemaciclib  100 mg by mouth every 12 hours on days 1 to 28 of a 28-day cycle. This is being given in combination with anastrozole  and zoledronic  acid. Therapy is planned to continue until disease progression or unacceptable toxicity. She last saw clinical pharmacy on 04/08/24 and Dr. Odean on 03/24/24.   Response to Therapy ***  Labs, vitals, treatment parameters, and manufacturer guidelines assessing toxicity were reviewed with [name] [name] today. Based on these values, patient is in agreement to continue abemaciclib  therapy at this time.  Allergies No Known Allergies  Vitals    04/08/2024   11:18 AM 03/24/2024    2:26 PM 03/12/2024    8:38 AM  Oncology Vitals  Height  170 cm 170 cm  Weight 64.638 kg 66.361 kg 65.375 kg  Weight (lbs) 142 lbs 8 oz 146 lbs 5 oz 144 lbs 2 oz  BMI 22.32 kg/m2 22.91 kg/m2 22.57 kg/m2  Temp 98.2 F (36.8 C) 98.3 F (36.8 C) 97.5 F (36.4 C)  Pulse Rate 85 96 91  BP 119/64 128/59 136/83  Resp 17 16 18   SpO2 100 % 100 % 100 %  BSA (m2) 1.75 m2 1.77 m2 1.76 m2    Laboratory Data    Latest Ref Rng & Units 04/08/2024   10:44 AM 03/24/2024    2:13 PM 03/05/2024    1:22 PM  CBC EXTENDED  WBC 4.0 - 10.5 K/uL 2.3  2.4  4.5   RBC 3.87 - 5.11 MIL/uL 3.20  3.44  3.33   Hemoglobin 12.0 - 15.0 g/dL 89.9  89.3  89.9   HCT 36.0 - 46.0 % 29.9  31.4  30.3   Platelets 150 - 400 K/uL 296  221  244   NEUT# 1.7 - 7.7 K/uL 1.6  1.4  2.5   Lymph# 0.7 - 4.0 K/uL 0.4  0.7  1.3        Latest Ref Rng & Units 04/08/2024   10:44 AM  03/24/2024    2:13 PM 03/05/2024    1:22 PM  CMP  Glucose 70 - 99 mg/dL 95  859  893   BUN 8 - 23 mg/dL 12  13  21    Creatinine 0.44 - 1.00 mg/dL 9.14  9.05  8.80   Sodium 135 - 145 mmol/L 140  138  140   Potassium 3.5 - 5.1 mmol/L 3.3  3.7  4.3   Chloride 98 - 111 mmol/L 105  103  104   CO2 22 - 32 mmol/L 25  24  28    Calcium  8.9 - 10.3 mg/dL 7.7  8.4  9.0   Total Protein 6.5 - 8.1 g/dL 6.5  7.2  7.4   Total Bilirubin 0.0 - 1.2 mg/dL 0.3  0.5  0.6   Alkaline Phos 38 - 126 U/L 137  236  180   AST 15 - 41 U/L 20  23  21    ALT 0 - 44 U/L 17  18  19      No results found for: MG Lab Results  Component Value Date   CA2729 561.5 (H)  04/08/2024   CA2729 1,125.3 (H) 03/24/2024   CA2729 1,138.7 (H) 03/05/2024     Adverse Effects Assessment ANC: 1600 (last visit 1400): Recheck 2 weeks Calcium : decreased. She will start taking her vitamin D  daily. She will also supplement with TUMS (pt preference). Recheck 2 weeks Potassium: decreased. Discussed potassium rich foods and patient handout given. Recheck 2 weeks.  Adherence Assessment [name] [name] reports missing 1 doses over the past 2 weeks.   Reason for missed dose: slept late Patient was re-educated on importance of adherence.   Access Assessment [name] [name] is currently receiving her abemaciclib  through [facility] concerns:  none  Medication Reconciliation The patient's medication list was reviewed today with the patient? Yes New medications or herbal supplements have recently been started? No  Any medications have been discontinued? No  The medication list was updated and reconciled based on the patient's most recent medication list in the electronic medical record (EMR) including herbal products and OTC medications.   Medications Current Outpatient Medications  Medication Sig Dispense Refill   abemaciclib  (VERZENIO ) 100 MG tablet Take 1 tablet (100 mg total) by mouth 2 (two) times daily.  (Patient not taking: Reported on 03/24/2024) 60 tablet 3   anastrozole  (ARIMIDEX ) 1 MG tablet Take 1 tablet (1 mg total) by mouth daily. 90 tablet 3   Cholecalciferol (VITAMIN D3) 125 MCG (5000 UT) TABS Take by mouth daily with supper.     HYDROcodone -acetaminophen  (NORCO/VICODIN) 5-325 MG tablet 1 tablet. 5 mg (Patient not taking: Reported on 03/24/2024)     levothyroxine  (SYNTHROID ) 100 MCG tablet Take 1 tablet (100 mcg total) by mouth daily before breakfast. 90 tablet 3   MAGNESIUM GLYCINATE PO Take 1 capsule by mouth 4 (four) times a week. At bedtime.     Menaquinone-7 (K2 PO) Take 1 capsule by mouth daily with supper. MK-7 Vitamin K-2     ondansetron  (ZOFRAN ) 8 MG tablet Take 1 tablet (8 mg total) by mouth every 8 (eight) hours as needed for nausea or vomiting. 30 tablet 2   Vitamins A & D (VITAMIN A & D) 10000-400 units TABS Take 1 tablet by mouth daily with supper. Now Ultra A & D-3     No current facility-administered medications for this visit.   Drug-Drug Interactions (DDIs) DDIs were evaluated? Yes Significant DDIs? No  The patient was instructed to speak with their health care provider and/or the oral chemotherapy pharmacist before starting any new drug, including prescription or over the counter, natural / herbal products, or vitamins.  Supportive Care Diarrhea: we reviewed that diarrhea is common with abemaciclib  and confirmed that she does have loperamide (Imodium) at home.  We reviewed how to take this medication PRN. Neutropenia: we discussed the importance of having a thermometer and what the Centers for Disease Control and Prevention (CDC) considers a fever which is 100.62F (38C) or higher.  Gave patient 24/7 triage line to call if any fevers or symptoms. ILD/Pneumonitis: we reviewed potential symptoms including cough, shortness, and fatigue.  VTE: reviewed signs of DVT such as leg swelling, redness, pain, or tenderness and signs of PE such as shortness of breath, rapid or  irregular heartbeat, cough, chest pain, or lightheadedness. Reviewed to take the medication every 12 hours (with food sometimes can be easier on the stomach) and to take it at the same time every day. Drug interactions with grapefruit products  Dosing Assessment Hepatic adjustments needed? No  Renal adjustments needed? No  Toxicity adjustments  needed? No  The current dosing regimen is appropriate to continue at this time.  Follow-Up Plan Continue abemaciclib  100 by mouth every 12 hours Continue anastrozole  1 mg by mouth daily Continue zoledronic  acid 3.3 mg IV every 12 weeks. Last given 03/05/24, next due 05/28/24 Will add labs, pharmacy clinic visit in 2 weeks to recheck Hgb, ANC, Calcium , and Potassium Will add labs, pharmacy clinic  visit, and zoledronic  acid on 05/28/24 Labs. Dr. Odean visit scheduled for 05/06/24 [name] [name] can follow up with clinical pharmacy as deemed necessary by Dr. Mackey Odean going forward   Kelyse CHRISTELLA [name] participated in the discussion, expressed understanding, and voiced agreement with the above plan. All questions were answered to her satisfaction. The patient was advised to contact the clinic at [phone] with any questions or concerns prior to her return visit.   I spent 30 minutes assessing and educating the patient.  Alvena Kiernan A. Lucila, PharmD, BCOP, CPP  Norleen DELENA Lucila, RPH-CPP, 04/17/2024  9:49 AM   **Disclaimer: This note was dictated with voice recognition software. Similar sounding words can inadvertently be transcribed and this note may contain transcription errors which may not have been corrected upon publication of note.**

## 2024-04-21 NOTE — Progress Notes (Incomplete)
" °  Radiation Oncology         [phone] ________________________________  Name: [name] [name] MRN: [id number]  Date of Service: 05/05/2024  DOB: [birth year]  Post Treatment Telephone Note  Diagnosis:  Secondary malignant neoplasm of bone   First Treatment Date: 2024-03-18 Last Treatment Date: 2024-04-01   Plan Name: Pelvis_Sacrum Site: Sacrum Technique: Isodose Plan Mode: Photon Dose Per Fraction: 3 Gy Prescribed Dose (Delivered / Prescribed): 30 Gy / 30 Gy Prescribed Fxs (Delivered / Prescribed): 10 / 10   Plan Name: Ext_R_Femur Site: Femur Right Technique: Isodose Plan Mode: Photon Dose Per Fraction: 3 Gy Prescribed Dose (Delivered / Prescribed): 30 Gy / 30 Gy Prescribed Fxs (Delivered / Prescribed): 10 / 10   Plan Name: Ext_L_Femur Site: Femur Left Technique: Isodose Plan Mode: Photon Dose Per Fraction: 3 Gy Prescribed Dose (Delivered / Prescribed): 30 Gy / 30 Gy Prescribed Fxs (Delivered / Prescribed): 10 / 10  The patient {WAS/WAS NOT:[id number]::was not} available for call today.  The patient {Desc; did/not:[id number]}note fatigue during radiation. The patient {Desc; did/not:[id number]} note skin changes in the field of radiation during therapy. The patient {HAS HAS WNU:81165} noticed improvement in pain in the area(s) treated with radiation. The patient {ACTION; IS/IS WNU:[id number]} taking dexamethasone . The patient {DOES_DOES WNU:81435} have symptoms of  weakness or loss of control of the extremities. The patient {DOES_DOES WNU:81435} have symptoms of headache. The patient {DOES_DOES WNU:81435} have symptoms of seizure or uncontrolled movement. The patient {DOES_DOES WNU:81435} have symptoms of changes in vision. The patient {DOES_DOES WNU:81435} have changes in speech. The patient {DOES_DOES WNU:81435} have confusion.   The patient {ACTION; IS/IS WNU:[id number]} scheduled for ongoing care with Dr. Odean in medical oncology. The patient was encouraged to call if  {he/she/they:23295} develops concerns or questions regarding radiation.   "

## 2024-04-22 ENCOUNTER — Inpatient Hospital Stay

## 2024-04-22 ENCOUNTER — Inpatient Hospital Stay: Admitting: Pharmacist

## 2024-04-22 VITALS — BP 123/74 | HR 88 | Temp 98.2°F | Resp 18 | Ht 67.0 in | Wt 138.6 lb

## 2024-04-22 DIAGNOSIS — C50212 Malignant neoplasm of upper-inner quadrant of left female breast: Secondary | ICD-10-CM | POA: Diagnosis not present

## 2024-04-22 DIAGNOSIS — Z170 Estrogen receptor positive status [ER+]: Secondary | ICD-10-CM

## 2024-04-22 LAB — CBC WITH DIFFERENTIAL (CANCER CENTER ONLY)
Abs Immature Granulocytes: 0.01 K/uL (ref 0.00–0.07)
Basophils Absolute: 0 K/uL (ref 0.0–0.1)
Basophils Relative: 1 %
Eosinophils Absolute: 0.1 K/uL (ref 0.0–0.5)
Eosinophils Relative: 4 %
HCT: 31.4 % — ABNORMAL LOW (ref 36.0–46.0)
Hemoglobin: 10.5 g/dL — ABNORMAL LOW (ref 12.0–15.0)
Immature Granulocytes: 0 %
Lymphocytes Relative: 14 %
Lymphs Abs: 0.4 K/uL — ABNORMAL LOW (ref 0.7–4.0)
MCH: 31.6 pg (ref 26.0–34.0)
MCHC: 33.4 g/dL (ref 30.0–36.0)
MCV: 94.6 fL (ref 80.0–100.0)
Monocytes Absolute: 0.2 K/uL (ref 0.1–1.0)
Monocytes Relative: 7 %
Neutro Abs: 1.9 K/uL (ref 1.7–7.7)
Neutrophils Relative %: 74 %
Platelet Count: 320 K/uL (ref 150–400)
RBC: 3.32 MIL/uL — ABNORMAL LOW (ref 3.87–5.11)
RDW: 18.6 % — ABNORMAL HIGH (ref 11.5–15.5)
WBC Count: 2.6 K/uL — ABNORMAL LOW (ref 4.0–10.5)
nRBC: 0 % (ref 0.0–0.2)

## 2024-04-22 LAB — CMP (CANCER CENTER ONLY)
ALT: 24 U/L (ref 0–44)
AST: 24 U/L (ref 15–41)
Albumin: 4.3 g/dL (ref 3.5–5.0)
Alkaline Phosphatase: 155 U/L — ABNORMAL HIGH (ref 38–126)
Anion gap: 12 (ref 5–15)
BUN: 8 mg/dL (ref 8–23)
CO2: 25 mmol/L (ref 22–32)
Calcium: 8.4 mg/dL — ABNORMAL LOW (ref 8.9–10.3)
Chloride: 105 mmol/L (ref 98–111)
Creatinine: 1.03 mg/dL — ABNORMAL HIGH (ref 0.44–1.00)
GFR, Estimated: >60 mL/min
Glucose, Bld: 97 mg/dL (ref 70–99)
Potassium: 3.8 mmol/L (ref 3.5–5.1)
Sodium: 142 mmol/L (ref 135–145)
Total Bilirubin: 0.4 mg/dL (ref 0.0–1.2)
Total Protein: 7.2 g/dL (ref 6.5–8.1)

## 2024-04-23 ENCOUNTER — Other Ambulatory Visit: Payer: Self-pay

## 2024-04-23 LAB — CANCER ANTIGEN 15-3: CA 15-3: 624 U/mL — ABNORMAL HIGH (ref 0.0–25.0)

## 2024-04-23 LAB — CANCER ANTIGEN 27.29: CA 27.29: 455.1 U/mL — ABNORMAL HIGH (ref 0.0–38.6)

## 2024-04-25 ENCOUNTER — Other Ambulatory Visit: Payer: Self-pay

## 2024-04-25 NOTE — Progress Notes (Signed)
 Specialty Pharmacy Refill Coordination Note  [name] [name] is a 63 y.o. female contacted today regarding refills of specialty medication(s) Abemaciclib  (VERZENIO )   Patient requested Delivery   Delivery date: 04/30/24   Verified address: [address] [place] [address]   Medication will be filled on: 04/29/24

## 2024-04-28 ENCOUNTER — Telehealth: Payer: Self-pay | Admitting: Radiation Oncology

## 2024-04-28 NOTE — Telephone Encounter (Signed)
 Received call from pt asking to cx 1/5 f/u. She states at this time she is feeling much better and does not have any immediate questions/concerns that need to be addressed. Pt refused to r/s call at this time.

## 2024-04-29 ENCOUNTER — Other Ambulatory Visit: Payer: Self-pay

## 2024-05-04 ENCOUNTER — Other Ambulatory Visit: Payer: Self-pay | Admitting: Pharmacist

## 2024-05-04 DIAGNOSIS — C50212 Malignant neoplasm of upper-inner quadrant of left female breast: Secondary | ICD-10-CM

## 2024-05-04 NOTE — Progress Notes (Signed)
 Per Dr. Gudena, added iron panel per patient request to be drawn on 05/06/24 at Dr. Odean visit.

## 2024-05-05 ENCOUNTER — Ambulatory Visit

## 2024-05-05 ENCOUNTER — Inpatient Hospital Stay

## 2024-05-05 ENCOUNTER — Encounter: Payer: Self-pay | Admitting: Genetic Counselor

## 2024-05-05 ENCOUNTER — Inpatient Hospital Stay: Attending: Hematology and Oncology | Admitting: Genetic Counselor

## 2024-05-05 DIAGNOSIS — Z170 Estrogen receptor positive status [ER+]: Secondary | ICD-10-CM

## 2024-05-05 DIAGNOSIS — C7951 Secondary malignant neoplasm of bone: Secondary | ICD-10-CM

## 2024-05-05 DIAGNOSIS — Z800 Family history of malignant neoplasm of digestive organs: Secondary | ICD-10-CM

## 2024-05-05 DIAGNOSIS — Z803 Family history of malignant neoplasm of breast: Secondary | ICD-10-CM

## 2024-05-05 NOTE — Progress Notes (Signed)
 REFERRING PROVIDER: Lanell Donald Stagger, PA-C   PRIMARY PROVIDER:  Catherine Fuller A, DO  PRIMARY REASON FOR VISIT:  1. Malignant neoplasm of upper-inner quadrant of left breast in female, estrogen receptor positive (HCC)   2. Metastasis to bone (HCC)   3. Family history of malignant neoplasm of breast   4. Family history of malignant neoplasm of gastrointestinal tract      HISTORY OF PRESENT ILLNESS:   [name] [name], [name] 64 y.o. female, was seen for [name] [name] cancer genetics consultation at the request of [name] Donald Stagger, PA-C due to [name] personal and family history of cancer.  [name] [name] presents to clinic today to discuss the possibility of [name] hereditary predisposition to cancer, to discuss genetic testing, and to further clarify her future cancer risks, as well as potential cancer risks for family members.   CANCER HISTORY:  In 2015, at the age of 50, [name] [name] was diagnosed with left breast cancer, ILC ER+, PR+, Her2-. She was treated with [name] lumpectomy, ALND, adjuvant chemotherapy, radiation and chemoprevention. In 12/2023, she was having hip and low back pain, diagnosed with bone metastases. Treated with radiation, anastrozole  and verzinio.   RELEVANT MEDICAL HISTORY:  Ovaries intact: yes.  Uterus intact: no. Hysterectomy at 41 due to menorrhagia  Menopausal status: postmenopausal.  HRT use: 0 years. Colonoscopy: yes; 2019, one polyp. Mammogram within the last year: yes. 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 No problems since-pt states was caring for ailing father, anxiety   Personal history of radiation therapy    left breast 2015   Psychophysiological insomnia 04/15/2020   Skin lesion 08/10/2015   Right forearm and left shoulder  [place] dermatology note 08/2015: Dr. Forestine, inflamed seborrheic keratosis left posterior shoulder, no treatment. Actinic keratosis on right arm with cryotherapy. Benign scattered seborrheic keratosis.   Thyroid  disease    Tick bite of right thigh 2016   Use of tamoxifen  (Nolvadex ) 08/10/2015   Started: 03/2014    Wears glasses     Past Surgical History:  Procedure Laterality Date   ABDOMINAL HYSTERECTOMY  02/07/2011   Procedure: HYSTERECTOMY ABDOMINAL;  Surgeon: Truman Corona;  Location: [facility];  Service: Gynecology;  Laterality: N/[name];   BREAST EXCISIONAL BIOPSY Right 10/07/2014   high risk   BREAST EXCISIONAL BIOPSY Left 12/24/2015   benign   BREAST LUMPECTOMY Left 09/09/2013   left breast 2015   BREAST LUMPECTOMY WITH NEEDLE LOCALIZATION AND AXILLARY LYMPH NODE DISSECTION Left 09/09/2013   Procedure: LEFT BREAST LUMPECTOMY WITH NEEDLE LOCALIZATION AND LEFT AXILLARY LYMPH NODE DISSECTION;  Surgeon: Morene ONEIDA Olives, MD;  Location: [facility];  Service: General;  Laterality: Left;   BREAST LUMPECTOMY WITH RADIOACTIVE SEED LOCALIZATION Right 10/07/2014   Procedure:  RIGHT BREAST LUMPECTOMY WITH RADIOACTIVE SEED LOCALIZATION;  Surgeon: Morene Olives, MD;  Location: [facility];  Service: General;  Laterality: Right;   BREAST LUMPECTOMY WITH RADIOACTIVE SEED LOCALIZATION Left 12/24/2015   Procedure: LEFT BREAST LUMPECTOMY WITH RADIOACTIVE SEED LOCALIZATION;  Surgeon: Morene Olives, MD;  Location: [facility];  Service: General;  Laterality: Left;   CESAREAN SECTION     DILATION AND CURETTAGE OF UTERUS     ENDOMETRIAL ABLATION  2006   LAPAROSCOPIC ASSISTED VAGINAL HYSTERECTOMY  02/07/2011   Procedure: LAPAROSCOPIC ASSISTED VAGINAL HYSTERECTOMY;  Surgeon: Truman Corona;  Location: [facility];  Service: Gynecology;  Laterality: N/[name];   PORTACATH PLACEMENT Right 09/29/2013   Procedure: INSERTION PORT-[name]-CATH;  Surgeon: Morene ONEIDA Olives, MD;  Location: [facility];  Service: General;  Laterality: Right;   THYROIDECTOMY, PARTIAL Left 2001   TOTAL THYROIDECTOMY  05/10/2023    Social History   Socioeconomic History   Marital status: Married    Spouse name: Not on file   Number of children: Not on file   Years of education: Not on file   Highest education level: 12th grade  Occupational History   Occupation: Register of deeds    Comment: Full time  Tobacco Use   Smoking status: Never    Passive exposure: Never   Smokeless tobacco: Never  Vaping Use   Vaping status: Never Used  Substance and Sexual Activity   Alcohol use: Not Currently    Alcohol/week: 2.0 standard drinks of alcohol    Types: 2 Glasses of wine per week    Comment: socially   Drug use: Not Currently    Comment: smoked marijuana last month   Sexual activity: Yes    Birth control/protection: Surgical  Other Topics Concern   Not on file  Social History Narrative   Married, Alm. 2 children Bard and Brewer)   HS graduation; [employer] employee (desk job)   Nonsmoker, 3-5 drinks weekly of etoh,  Denies drugs.    Drinks caffeine beverages   Wears seatbelt, smoke detector in the home   Firearms in the home   Exercises >3 x week.    Feels safe in relationships.    Social Drivers of Health   Tobacco Use: Low Risk (03/12/2024)   Patient History    Smoking Tobacco Use: Never    Smokeless Tobacco Use: Never    Passive Exposure: Never  Financial  Resource Strain: Low Risk  (04/01/2024)   Received from [facility]   Overall Financial Resource Strain (CARDIA)    Difficulty of Paying Living Expenses: Not hard at all  Food Insecurity: No Food Insecurity (04/01/2024)   Received from [facility]   Epic    Within the past 12 months, you worried that your food would run out before you got the money to buy more.: Never true    Within the past 12 months, the food you bought just didn't last and you didn't have money to get more.: Never true  Transportation Needs: No Transportation Needs (04/01/2024)   Received from [facility] - Transportation    In the past 12 months, has lack of transportation kept you from medical appointments or from getting medications?: No    Lack of Transportation (Non-Medical): No  Physical Activity: Unknown (01/25/2024)   Exercise Vital Sign    Days of Exercise per Week: Patient declined    Minutes of Exercise per Session: Not on  file  Stress: No Stress Concern Present (01/25/2024)   [employer] [facility] [employer] - Occupational Stress Questionnaire    Feeling of Stress: Not at all  Social Connections: Socially Integrated (01/25/2024)   Social Connection and Isolation Panel    Frequency of Communication with Friends and Family: Twice [name] week    Frequency of Social Gatherings with Friends and Family: Once [name] week    Attends Religious Services: More than 4 times per year    Active Member of Clubs or Organizations: Yes    Attends Banker Meetings: More than 4 times per year    Marital Status: Married  Depression (PHQ2-9): Low Risk (03/12/2024)   Depression (PHQ2-9)    PHQ-2 Score: 0  Alcohol Screen: Low Risk (03/12/2024)   Alcohol Screen    Last Alcohol Screening Score (AUDIT): 0  Housing: Low Risk  (04/01/2024)   Received from [facility]   Epic    In the last 12 months, was there [name] time when you were not able to  pay the mortgage or rent on time?: No    In the past 12 months, how many times have you moved where you were living?: 0    At any time in the past 12 months, were you homeless or living in [name] shelter (including now)?: No  Utilities: Not At Risk (04/01/2024)   Received from [facility]   Epic    In the past 12 months has the electric, gas, oil, or water company threatened to shut off services in your home?: No  Health Literacy: Not on file     FAMILY HISTORY:  We obtained [name] detailed, 4-generation family history.  Significant diagnoses are listed below: Family History  Problem Relation Age of Onset   Breast cancer Mother        dx 50s. Died at 65   Non-Hodgkin's lymphoma Mother 73   Angina Father    Aortic aneurysm Father    Dementia Father    CAD Father    Arrhythmia Maternal Aunt        [name] fib   Breast cancer Maternal Aunt 56   Thyroid  disease Maternal Aunt    Arrhythmia Maternal Aunt        [name] fib   Breast cancer Maternal Aunt 55 - 79       bilateral   Cancer Maternal Aunt 58       sarcoma   Breast cancer Maternal Aunt 45   Thyroid  disease Maternal Uncle    Cancer Paternal Uncle        unknown primary in early 62s   Colon cancer Maternal Grandmother        dx 54s; died at 9   Thyroid  disease Maternal Grandmother    Colon cancer Maternal Grandfather 69   Rectal cancer Paternal Grandmother    Hypertension Other    Arrhythmia Other        Uncle- [name] fib   Stomach cancer Neg Hx    Esophageal cancer Neg Hx     [name] [name] is unaware of previous family history of genetic testing for hereditary cancer risks. There is no reported Ashkenazi Jewish ancestry.     GENETIC COUNSELING ASSESSMENT: [name] [name] is [name] 64 y.o. female with [name] personal and family history of cancer which is somewhat suggestive of [name] hereditary predisposition to cancer given her personal history of breast cancer and family history of four close relatives diagnosed with breast cancer on  the maternal  side of the family. We, therefore, discussed and recommended the following at today's visit.   DISCUSSION: We discussed that 5 - 10% of cancer is hereditary, with most cases of breast associated with pathogenic variants in BRCA1/2.  There are other genes that can be associated with hereditary breast cancer syndromes.  We discussed that testing is beneficial for several reasons including knowing how to follow individuals after completing their treatment, identifying whether potential treatment options such as PARPs would be beneficial, and understanding if other family members could be at risk for cancer and allowing them to undergo genetic testing.   We reviewed the characteristics, features and inheritance patterns of hereditary cancer syndromes. We also discussed genetic testing, including the appropriate family members to test, the process of testing, insurance coverage and turn-around-time for results. We discussed the implications of [name] negative, positive, carrier and/or variant of uncertain significant result. We recommended [name] [name] pursue genetic testing for [name] panel that includes genes associated with breast cancer.   [name] [name]  was offered [name] common hereditary cancer panel (40+ genes) and an expanded pan-cancer panel (70+ genes). [name] [name] was informed of the benefits and limitations of each panel, including that expanded pan-cancer panels contain genes that do not have clear management guidelines at this point in time.  We also discussed that as the number of genes included on [name] panel increases, the chances of variants of uncertain significance increases. After considering the benefits and limitations of each gene panel, [name] [name] elected to have [facility] CancerNext-Expanded +RNAinsight panel. The CancerNext-Expanded gene panel offered by [facility] and includes sequencing, rearrangement, and RNA analysis for the following 77 genes: AIP, ALK, APC, ATM, AXIN2, BAP1, BARD1, BMPR1A, BRCA1, BRCA2,  BRIP1, CDC73, CDH1, CDK4, CDKN1B, CDKN2A, CEBPA, CHEK2, CTNNA1, DDX41, DICER1, EGFR, EPCAM, ETV6, FH, FLCN, GATA2, GREM1, HOXB13, KIT, LZTR1, MAX, MBD4, MEN1, MET, MITF, MLH1, MSH2, MSH3, MSH6, MUTYH, NF1, NF2, NTHL1, PALB2, PDGFRA, PHOX2B, PMS2, POLD1, POLE, POT1, PRKAR1A, PTCH1, PTEN, RAD51C, RAD51D, RB1, RET, RPS20, RUNX1, SDHA, SDHAF2, SDHB, SDHC, SDHD, SMAD4, SMARCA4, SMARCB1, SMARCE1, STK11, SUFU, TMEM127, TP53, TSC1, TSC2, VHL, WT1.    Based on [name] [name] personal and family history of cancer, she meets medical criteria for genetic testing. Despite that she meets criteria, she may still have an out of pocket cost. We discussed that if her out of pocket cost for testing is over $100, the laboratory should contact them to discuss self-pay prices, patient pay assistance programs, if applicable, and other billing options.  We discussed that some people do not want to undergo genetic testing due to fear of genetic discrimination.  [name] federal law called the Genetic Information Non-Discrimination Act (GINA) of 2008 helps protect individuals against genetic discrimination based on their genetic test results.  It impacts both health insurance and employment.  With health insurance, it protects against increased premiums, being kicked off insurance or being forced to take [name] test in order to be insured.  For employment it protects against hiring, firing and promoting decisions based on genetic test results.  GINA does not apply to those in the [employer], those who work for companies with less than 15 employees, and new life insurance or long-term disability insurance policies.  Health status due to [name] cancer diagnosis is not protected under GINA.  PLAN: After considering the risks, benefits, and limitations, [name] [name] provided informed consent to pursue genetic testing and the blood sample was sent to [facility] for analysis of the CancerNext-Expanded +  RNAinsight test. Results should be available  within approximately 2-3 weeks' time, at which point they will be disclosed by telephone to [name] [name], as will any additional recommendations warranted by these results. [name] [name] will receive [name] summary of her genetic counseling visit and [name] copy of her results once available. This information will also be available in Epic.   Lastly, we encouraged Ms. Macaraeg to remain in contact with cancer genetics annually so that we can continuously update the family history and inform her of any changes in cancer genetics and testing that may be of benefit for this family.   Ms. Maniscalco questions were answered to her satisfaction today. Our contact information was provided should additional questions or concerns arise. Thank you for the referral and allowing us  to share in the care of your patient.   Burnard Ogren, MS, [facility] Licensed, [email or web address] phone: [phone]   60 minutes were spent on the date of the encounter in service to the patient including preparation, face-to-face consultation, documentation and care coordination.  The patient brought her spouse, [name] [name] and/or [name] were available to discuss this case as needed.   _______________________________________________________________________ For Office Staff:  Number of people involved in session: 2 Was an Intern/ student involved with case: no

## 2024-05-06 ENCOUNTER — Inpatient Hospital Stay (HOSPITAL_BASED_OUTPATIENT_CLINIC_OR_DEPARTMENT_OTHER): Admitting: Hematology and Oncology

## 2024-05-06 ENCOUNTER — Inpatient Hospital Stay

## 2024-05-06 VITALS — BP 124/74 | HR 83 | Temp 98.2°F | Resp 19 | Wt 138.0 lb

## 2024-05-06 DIAGNOSIS — Z170 Estrogen receptor positive status [ER+]: Secondary | ICD-10-CM

## 2024-05-06 DIAGNOSIS — C7951 Secondary malignant neoplasm of bone: Secondary | ICD-10-CM

## 2024-05-06 DIAGNOSIS — C50212 Malignant neoplasm of upper-inner quadrant of left female breast: Secondary | ICD-10-CM | POA: Diagnosis not present

## 2024-05-06 LAB — CBC WITH DIFFERENTIAL (CANCER CENTER ONLY)
Abs Immature Granulocytes: 0.01 K/uL (ref 0.00–0.07)
Basophils Absolute: 0 K/uL (ref 0.0–0.1)
Basophils Relative: 1 %
Eosinophils Absolute: 0 K/uL (ref 0.0–0.5)
Eosinophils Relative: 1 %
HCT: 29.6 % — ABNORMAL LOW (ref 36.0–46.0)
Hemoglobin: 10.1 g/dL — ABNORMAL LOW (ref 12.0–15.0)
Immature Granulocytes: 1 %
Lymphocytes Relative: 18 %
Lymphs Abs: 0.4 K/uL — ABNORMAL LOW (ref 0.7–4.0)
MCH: 32.8 pg (ref 26.0–34.0)
MCHC: 34.1 g/dL (ref 30.0–36.0)
MCV: 96.1 fL (ref 80.0–100.0)
Monocytes Absolute: 0.2 K/uL (ref 0.1–1.0)
Monocytes Relative: 8 %
Neutro Abs: 1.5 K/uL — ABNORMAL LOW (ref 1.7–7.7)
Neutrophils Relative %: 71 %
Platelet Count: 266 K/uL (ref 150–400)
RBC: 3.08 MIL/uL — ABNORMAL LOW (ref 3.87–5.11)
RDW: 18 % — ABNORMAL HIGH (ref 11.5–15.5)
WBC Count: 2.1 K/uL — ABNORMAL LOW (ref 4.0–10.5)
nRBC: 0 % (ref 0.0–0.2)

## 2024-05-06 LAB — CMP (CANCER CENTER ONLY)
ALT: 18 U/L (ref 0–44)
AST: 22 U/L (ref 15–41)
Albumin: 4.3 g/dL (ref 3.5–5.0)
Alkaline Phosphatase: 145 U/L — ABNORMAL HIGH (ref 38–126)
Anion gap: 10 (ref 5–15)
BUN: 12 mg/dL (ref 8–23)
CO2: 25 mmol/L (ref 22–32)
Calcium: 8.5 mg/dL — ABNORMAL LOW (ref 8.9–10.3)
Chloride: 101 mmol/L (ref 98–111)
Creatinine: 1.14 mg/dL — ABNORMAL HIGH (ref 0.44–1.00)
GFR, Estimated: 53 mL/min — ABNORMAL LOW
Glucose, Bld: 86 mg/dL (ref 70–99)
Potassium: 3.8 mmol/L (ref 3.5–5.1)
Sodium: 136 mmol/L (ref 135–145)
Total Bilirubin: 0.5 mg/dL (ref 0.0–1.2)
Total Protein: 7.1 g/dL (ref 6.5–8.1)

## 2024-05-06 LAB — IRON AND IRON BINDING CAPACITY (CC-WL,HP ONLY)
Iron: 89 ug/dL (ref 28–170)
Saturation Ratios: 24 % (ref 10.4–31.8)
TIBC: 374 ug/dL (ref 250–450)
UIBC: 284 ug/dL

## 2024-05-06 LAB — FERRITIN: Ferritin: 245 ng/mL (ref 11–307)

## 2024-05-06 LAB — GENETIC SCREENING ORDER

## 2024-05-06 NOTE — Progress Notes (Signed)
 "  Patient Care Team: Catherine Charlies LABOR, DO as PCP - General (Family Medicine) Latisha Medford, MD as Consulting Physician (Obstetrics and Gynecology) Leigh, Elspeth SQUIBB, MD as Consulting Physician (Gastroenterology) Dartha Ernst, MD as Consulting Physician (Endocrinology) Delane Lye, MD as Referring Physician (Orthopedic Surgery) Odean Potts, MD as Medical Oncologist (Hematology and Oncology)  DIAGNOSIS:  Encounter Diagnosis  Name Primary?   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  She has been on abemaciclib  100 mg twice daily with anastrozole  for about 1.5 to 2 months. She is tolerating therapy well with mostly good days and remains highly active, including recent travel and hiking. Appetite is improving and she is eating more. She has evening fatigue but remains fully ambulatory and expects to resume gardening.  Diarrhea is the main toxicity, occurring two to three times daily without antidiarrheals, with improved cramping compared to earlier in treatment. Intermittent loperamide use decreases stool frequency, especially when she plans to leave home, and on days without loperamide she typically has one to two bowel movements. She maintains hydration with occasional electrolyte drinks and has had no severe or refractory diarrhea.  She had marked improvement in mobility and pain after prior palliative pelvic radiation, with resolution of previous difficulty walking and driving. She currently has no musculoskeletal pain or limitation.        ALLERGIES:  has no known allergies.  MEDICATIONS:  Current Outpatient  Medications  Medication Sig Dispense Refill   abemaciclib  (VERZENIO ) 100 MG tablet Take 1 tablet (100 mg total) by mouth 2 (two) times daily. 60 tablet 3   anastrozole  (ARIMIDEX ) 1 MG tablet Take 1 tablet (1 mg total) by mouth daily. 90 tablet 3   Calcium  Citrate-Vitamin D  (CALCIUM  CITRATE + PO) Take 1,200 mg by mouth daily.     Cholecalciferol (VITAMIN D3) 125 MCG (5000 UT) TABS Take by mouth daily with supper.     HYDROcodone -acetaminophen  (NORCO/VICODIN) 5-325 MG tablet 1 tablet. 5 mg     levothyroxine  (SYNTHROID ) 100 MCG tablet Take 1 tablet (100 mcg total) by mouth daily before breakfast. 90 tablet 3   MAGNESIUM GLYCINATE PO Take 1 capsule by mouth 4 (four) times a week. At bedtime.     Menaquinone-7 (K2 PO) Take 1 capsule by mouth daily with supper. MK-7 Vitamin K-2     ondansetron  (ZOFRAN ) 8 MG tablet Take 1 tablet (8 mg total) by mouth every 8 (eight) hours as needed for nausea or vomiting. 30 tablet 2   Vitamins A & D (VITAMIN A & D) 10000-400 units TABS Take 1 tablet by mouth daily with supper. Now Ultra A & D-3     No current facility-administered medications for this visit.    PHYSICAL EXAMINATION: ECOG PERFORMANCE STATUS: 1 - Symptomatic but completely ambulatory  Vitals:   05/06/24 1124  BP: 124/74  Pulse: 83  Resp: 19  Temp: 98.2 F (36.8 C)  SpO2: 100%   Filed Weights   05/06/24 1124  Weight: 138 lb (62.6 kg)      LABORATORY DATA:  I have reviewed the data as listed    Latest Ref Rng & Units 05/06/2024   11:07 AM 04/22/2024  8:29 AM 04/08/2024   10:44 AM  CMP  Glucose 70 - 99 mg/dL 86  97  95   BUN 8 - 23 mg/dL 12  8  12    Creatinine 0.44 - 1.00 mg/dL 8.85  8.96  9.14   Sodium 135 - 145 mmol/L 136  142  140   Potassium 3.5 - 5.1 mmol/L 3.8  3.8  3.3   Chloride 98 - 111 mmol/L 101  105  105   CO2 22 - 32 mmol/L 25  25  25    Calcium  8.9 - 10.3 mg/dL 8.5  8.4  7.7   Total Protein 6.5 - 8.1 g/dL 7.1  7.2  6.5   Total Bilirubin 0.0 - 1.2 mg/dL 0.5  0.4   0.3   Alkaline Phos 38 - 126 U/L 145  155  137   AST 15 - 41 U/L 22  24  20    ALT 0 - 44 U/L 18  24  17      Lab Results  Component Value Date   WBC 2.1 (L) 05/06/2024   HGB 10.1 (L) 05/06/2024   HCT 29.6 (L) 05/06/2024   MCV 96.1 05/06/2024   PLT 266 05/06/2024   NEUTROABS 1.5 (L) 05/06/2024    ASSESSMENT & PLAN:  Malignant neoplasm of upper-inner quadrant of left breast in female, estrogen receptor positive (HCC) 01/18/2014: ILC ER 70% PR 70% Ki67 7% HER2 negative (patient was seen by Dr. 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  Left sacral ala fracture, moderate to severe facet arthropathy L1-2 L5 and S1   Treatment plan: PET CT scan: 02/13/2024: Diffuse bone metastases, no evidence of extraosseous metastases Bone marrow biopsy 02/21/2024: Completely replaced with metastatic lobular breast cancer ER 100%, PR 80%, HER2 0 negative Guardant 360: 02/15/2024: RB1 loss, PIK3CA mutation, CDH 1 mutation, TMB 15.18, MSI high not detected Current treatment: Anastrozole  started 02/13/2024 with Verzinio started 03/05/2024   Abemaciclib  toxicities: Leukopenia: ANC 1.4: Continue with the same dosage Diarrhea: 2-3 times per day if she does not take Imodium.  She tells me that she is able to handle it and would like to stay on the same dosage. Anemia: Monitoring closely   Bone metastases: Zometa  every 3 months given November 2025   Will continue with the same dosage Tumor markers are improving (CA 27-29: 455 (was 1460), CA 15-3: 624 (Was 1501) Scans to be done in  February 2025.  Return to clinic after scans to discuss results    No orders of the defined types were placed in this encounter.  The patient has a good understanding of the overall plan. she agrees with it. she will call with any problems that may develop before the next visit here.  I personally spent a total of 30 minutes in the care of the patient today including preparing to see the patient, getting/reviewing separately obtained history, performing a medically appropriate exam/evaluation, counseling and educating, placing orders, referring and communicating with other health care professionals, documenting clinical information in the EHR, independently interpreting results, communicating results, and coordinating care.   Viinay K Jeovanny Cuadros, MD 05/06/2024    "

## 2024-05-06 NOTE — Assessment & Plan Note (Signed)
 01/18/2014: ILC ER 70% PR 70% Ki67 7% HER2 negative (patient was seen by Dr. 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  Left sacral ala fracture, moderate to severe facet arthropathy L1-2 L5 and S1   Treatment plan: PET CT scan: 02/13/2024: Diffuse bone metastases, no evidence of extraosseous metastases Bone marrow biopsy 02/21/2024: Completely replaced with metastatic lobular breast cancer ER 100%, PR 80%, HER2 0 negative Guardant 360: 02/15/2024: RB1 loss, PIK3CA mutation, CDH 1 mutation, TMB 15.18, MSI high not detected Current treatment: Anastrozole  started 02/13/2024 with Verzinio started 03/05/2024   Abemaciclib  toxicities: Leukopenia: ANC 1.4: Continue with the same dosage Mild diarrhea once or twice a week Anemia: Monitoring closely   Bone metastases: Zometa  every 3 months given November 2025   Will continue with the same dosage Tumor markers are improving (CA 27-29: 455 (was 1460), CA 15-3: 624 (Was 1501) Scans to be done in February 2025.  Return to clinic after scans to discuss results

## 2024-05-07 LAB — CANCER ANTIGEN 27.29: CA 27.29: 393.3 U/mL — ABNORMAL HIGH (ref 0.0–38.6)

## 2024-05-07 LAB — CANCER ANTIGEN 15-3: CA 15-3: 552 U/mL — ABNORMAL HIGH (ref 0.0–25.0)

## 2024-05-19 ENCOUNTER — Other Ambulatory Visit: Payer: Self-pay

## 2024-05-19 ENCOUNTER — Ambulatory Visit: Payer: Self-pay | Admitting: Genetic Counselor

## 2024-05-19 DIAGNOSIS — Z1379 Encounter for other screening for genetic and chromosomal anomalies: Secondary | ICD-10-CM | POA: Insufficient documentation

## 2024-05-19 NOTE — Progress Notes (Signed)
 HPI:  Ms. Hahne was previously seen in the [facility] due to a personal and family history of breast cancer and concerns regarding a hereditary predisposition to cancer. Please refer to our prior cancer genetics clinic note for more information regarding our discussion, assessment and recommendations, at the time. Ms. Fulgham recent genetic test results were disclosed to her, as were recommendations warranted by these results. These results and recommendations are discussed in more detail below.  Results were disclosed by telephone on 05/19/24.   CANCER HISTORY:  In 2015, at the age of 64, Ms. Luton was diagnosed with left breast cancer, ILC ER+, PR+, Her2-. She was treated with a lumpectomy, ALND, adjuvant chemotherapy, radiation and chemoprevention. In 12/2023, she was having hip and low back pain, diagnosed with bone metastases. Treated with radiation, anastrozole  and verzinio.   FAMILY HISTORY:  We obtained a detailed, 4-generation family history.  Significant diagnoses are listed below: Family History  Problem Relation Age of Onset   Breast cancer Mother        dx 35s. Died at 30   Non-Hodgkin's lymphoma Mother 24   Angina Father    Aortic aneurysm Father    Dementia Father    CAD Father    Arrhythmia Maternal Aunt        A fib   Breast cancer Maternal Aunt 11   Thyroid  disease Maternal Aunt    Arrhythmia Maternal Aunt        A fib   Breast cancer Maternal Aunt 40 - 79       bilateral   Cancer Maternal Aunt 58       sarcoma   Breast cancer Maternal Aunt 45   Thyroid  disease Maternal Uncle    Cancer Paternal Uncle        unknown primary in early 67s   Colon cancer Maternal Grandmother        dx 13s; died at 84   Thyroid  disease Maternal Grandmother    Colon cancer Maternal Grandfather 69   Rectal cancer Paternal Grandmother    Hypertension Other    Arrhythmia Other        Uncle- a fib   Stomach cancer Neg Hx    Esophageal cancer Neg Hx     Ms.  Nieland is unaware of previous family history of genetic testing for hereditary cancer risks. There is no reported Ashkenazi Jewish ancestry.      GENETIC TEST RESULTS: Genetic testing reported out on 05/15/24 through the CancerNext-Expanded +RNAinsight panel found no pathogenic mutations. The CancerNext-Expanded gene panel offered by [facility] and includes sequencing, rearrangement, and RNA analysis for the following 77 genes: AIP, ALK, APC, ATM, AXIN2, BAP1, BARD1, BMPR1A, BRCA1, BRCA2, BRIP1, CDC73, CDH1, CDK4, CDKN1B, CDKN2A, CEBPA, CHEK2, CTNNA1, DDX41, DICER1, EGFR, EPCAM, ETV6, FH, FLCN, GATA2, GREM1, HOXB13, KIT, LZTR1, MAX, MBD4, MEN1, MET, MITF, MLH1, MSH2, MSH3, MSH6, MUTYH, NF1, NF2, NTHL1, PALB2, PDGFRA, PHOX2B, PMS2, POLD1, POLE, POT1, PRKAR1A, PTCH1, PTEN, RAD51C, RAD51D, RB1, RET, RPS20, RUNX1, SDHA, SDHAF2, SDHB, SDHC, SDHD, SMAD4, SMARCA4, SMARCB1, SMARCE1, STK11, SUFU, TMEM127, TP53, TSC1, TSC2, VHL, WT1. The test report has been scanned into EPIC and is located under the Molecular Pathology section of the Results Review tab.  A portion of the result report is included below for reference.     We discussed with Ms. Ewen that because current genetic testing is not perfect, it is possible there may be a gene mutation in one of these genes that current  testing cannot detect, but that chance is small.  We also discussed, that there could be another gene that has not yet been discovered, or that we have not yet tested, that is responsible for the cancer diagnoses in the family. It is also possible there is a hereditary cause for the cancer in the family that Ms. Hertzberg did not inherit and therefore was not identified in her testing.  Therefore, it is important to remain in touch with cancer genetics in the future so that we can continue to offer Ms. Marte the most up to date genetic testing.   ADDITIONAL GENETIC TESTING: We discussed with Ms. Bolen that her genetic testing was fairly  extensive.  If there are genes identified to increase cancer risk that can be analyzed in the future, we would be happy to discuss and coordinate this testing at that time.    CANCER SCREENING RECOMMENDATIONS: Ms. Correnti test result is considered negative (normal).  This means that we have not identified a hereditary cause for her personal and family history of cancer at this time. Most cancers happen by chance and this negative test suggests that her personal and family history of cancer may fall into this category.    Possible reasons for Ms. Peckman's negative genetic test include:  1. There may be a gene mutation in one of these genes that current testing methods cannot detect. The likelihood of this is low, but possible.   2. There could be another gene that has not yet been discovered, or that we have not yet tested, that is responsible for the cancer diagnoses in the family.  3.  There may be no hereditary risk for cancer in the family. The cancers in Ms. Yardley and/or her family may be sporadic/familial or due to other genetic and environmental factors. 4. It is also possible there is a hereditary cause for the cancer in the family that Ms. Rankin did not inherit.  Therefore, it is recommended she continue to follow the cancer management and screening guidelines provided by her oncology and primary healthcare provider. An individual's cancer risk and medical management are not determined by genetic test results alone. Overall cancer risk assessment incorporates additional factors, including personal medical history, family history, and any available genetic information that may result in a personalized plan for cancer prevention and surveillance  Given Ms. Perkin's personal and family histories, we must interpret these negative results with some caution.  Families with features suggestive of hereditary risk for cancer tend to have multiple family members with cancer, diagnoses in multiple generations  and diagnoses before the age of 33. Ms. Marcelino family exhibits some of these features. Thus, this result may simply reflect our current inability to detect all mutations within these genes or there may be a different gene that has not yet been discovered or tested.   Ms. Lawn does question if environment could have played a role in her breast cancer diagnosis. 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 Of the twelve women that worked in the office, approximately half have been diagnosed with breast cancer. We discussed that shared environmental causes can sometimes explain clusters of cancers diagnosed in specific communities, but that this can often be difficult in identifying one underlying explanation.   RECOMMENDATIONS FOR FAMILY MEMBERS:  Individuals in this family might be at some increased risk of developing cancer, over the general population risk, simply due to the  family history of cancer.  We recommended women in this family have a yearly mammogram beginning at age 25, or 72 years younger than the earliest onset of cancer, an annual clinical breast exam, and perform monthly breast self-exams. Women in this family should also have a gynecological exam as recommended by their primary provider. All family members should be referred for colonoscopy starting at age 72, or 87 years younger than the earliest onset of cancer.  Other relatives may benefit from completing their own genetic testing, especially if they have been diagnosed with cancer.   FOLLOW-UP: Lastly, we discussed with Ms. Mohabir that cancer genetics is a rapidly advancing field and it is possible that new genetic tests will be appropriate for her and/or her family members in the future. We encouraged her to remain in contact with cancer genetics on an annual basis so we can update her personal and family histories and let her know of advances in cancer genetics that  may benefit this family.   Our contact number was provided. Ms. Milius questions were answered to her satisfaction, and she knows she is welcome to call us  at anytime with additional questions or concerns.   Burnard Ogren, MS, [facility] Licensed, [email or web address] [phone]

## 2024-05-20 ENCOUNTER — Other Ambulatory Visit (HOSPITAL_COMMUNITY): Payer: Self-pay

## 2024-05-20 ENCOUNTER — Other Ambulatory Visit: Payer: Self-pay

## 2024-05-20 NOTE — Progress Notes (Signed)
 Specialty Pharmacy Refill Coordination Note  [name] [name] is a 64 y.o. female contacted today regarding refills of specialty medication(s) Abemaciclib  (VERZENIO )   Patient requested (Patient-Rptd) Delivery   Delivery date: 05/23/24   Verified address: (Patient-Rptd) [address], [place]   Medication will be filled on: 05/22/24

## 2024-05-22 ENCOUNTER — Other Ambulatory Visit: Payer: Self-pay

## 2024-05-27 NOTE — Progress Notes (Unsigned)
 [facility]        Telephone: [phone]?Fax: [phone]   Oncology Clinical Pharmacist Practitioner Progress Note   [name] [name] was contacted via in-person to discuss her chemotherapy regimen for abemaciclib  which they receive under the care of Dr. Vinay Gudena.  Current treatment regimen and start date Abemaciclib  (03/08/24) Anastrozole  02/13/24 Zoledronic  acid (03/05/24)  Interval History She continues on abemaciclib  100 mg by mouth every 12 hours on days 1 to 28 of a 28-day cycle. This is being given in combination with anastrozole  and zoledronic  acid. Therapy is planned to continue until disease progression or unacceptable toxicity. She last saw clinical pharmacy on 04/22/24 and Dr. Odean on 05/06/24.   Response to Therapy ***  Labs, vitals, treatment parameters, and manufacturer guidelines assessing toxicity were reviewed with [name] [name] today. Based on these values, patient is in agreement to continue abemaciclib  therapy at this time.  Allergies No Known Allergies  Vitals    05/06/2024   11:24 AM 04/22/2024    9:16 AM 04/08/2024   11:18 AM  Oncology Vitals  Height  170 cm   Weight 62.596 kg 62.869 kg 64.638 kg  Weight (lbs) 138 lbs 138 lbs 10 oz 142 lbs 8 oz  BMI 21.61 kg/m2 21.71 kg/m2 22.32 kg/m2  Temp 98.2 F (36.8 C) 98.2 F (36.8 C) 98.2 F (36.8 C)  Pulse Rate 83 88 85  BP 124/74 123/74 119/64  Resp 19 18 17   SpO2 100 % 100 % 100 %  BSA (m2) 1.72 m2 1.72 m2 1.75 m2    Laboratory Data    Latest Ref Rng & Units 05/06/2024   11:07 AM 04/22/2024    8:29 AM 04/08/2024   10:44 AM  CBC EXTENDED  WBC 4.0 - 10.5 K/uL 2.1  2.6  2.3   RBC 3.87 - 5.11 MIL/uL 3.08  3.32  3.20   Hemoglobin 12.0 - 15.0 g/dL 89.8  89.4  89.9   HCT 36.0 - 46.0 % 29.6  31.4  29.9   Platelets 150 - 400 K/uL 266  320  296   NEUT# 1.7 - 7.7 K/uL 1.5  1.9  1.6   Lymph# 0.7 - 4.0 K/uL 0.4  0.4  0.4        Latest Ref Rng & Units 05/06/2024   11:07 AM 04/22/2024     8:29 AM 04/08/2024   10:44 AM  CMP  Glucose 70 - 99 mg/dL 86  97  95   BUN 8 - 23 mg/dL 12  8  12    Creatinine 0.44 - 1.00 mg/dL 8.85  8.96  9.14   Sodium 135 - 145 mmol/L 136  142  140   Potassium 3.5 - 5.1 mmol/L 3.8  3.8  3.3   Chloride 98 - 111 mmol/L 101  105  105   CO2 22 - 32 mmol/L 25  25  25    Calcium  8.9 - 10.3 mg/dL 8.5  8.4  7.7   Total Protein 6.5 - 8.1 g/dL 7.1  7.2  6.5   Total Bilirubin 0.0 - 1.2 mg/dL 0.5  0.4  0.3   Alkaline Phos 38 - 126 U/L 145  155  137   AST 15 - 41 U/L 22  24  20    ALT 0 - 44 U/L 18  24  17      No results found for: MG Lab Results  Component Value Date   CA2729 393.3 (H) 05/06/2024   CA2729 455.1 (H)  04/22/2024   CA2729 561.5 (H) 04/08/2024   Adverse Effects Assessment ***  Adherence Assessment [name] [name] reports missing *** doses over the past *** weeks.   Reason for missed dose: *** Patient was re-educated on importance of adherence.   Access Assessment [name] [name] is currently receiving her abemaciclib  through [facility] concerns:  none  Medication Reconciliation The patient's medication list was reviewed today with the patient? Yes New medications or herbal supplements have recently been started? No  Any medications have been discontinued? No  The medication list was updated and reconciled based on the patient's most recent medication list in the electronic medical record (EMR) including herbal products and OTC medications.   Medications Current Outpatient Medications  Medication Sig Dispense Refill   abemaciclib  (VERZENIO ) 100 MG tablet Take 1 tablet (100 mg total) by mouth 2 (two) times daily. 60 tablet 3   anastrozole  (ARIMIDEX ) 1 MG tablet Take 1 tablet (1 mg total) by mouth daily. 90 tablet 3   Calcium  Citrate-Vitamin D  (CALCIUM  CITRATE + PO) Take 1,200 mg by mouth daily.     Cholecalciferol (VITAMIN D3) 125 MCG (5000 UT) TABS Take by mouth daily with supper.      HYDROcodone -acetaminophen  (NORCO/VICODIN) 5-325 MG tablet 1 tablet. 5 mg     levothyroxine  (SYNTHROID ) 100 MCG tablet Take 1 tablet (100 mcg total) by mouth daily before breakfast. 90 tablet 3   MAGNESIUM GLYCINATE PO Take 1 capsule by mouth 4 (four) times a week. At bedtime.     Menaquinone-7 (K2 PO) Take 1 capsule by mouth daily with supper. MK-7 Vitamin K-2     ondansetron  (ZOFRAN ) 8 MG tablet Take 1 tablet (8 mg total) by mouth every 8 (eight) hours as needed for nausea or vomiting. 30 tablet 2   Vitamins A & D (VITAMIN A & D) 10000-400 units TABS Take 1 tablet by mouth daily with supper. Now Ultra A & D-3     No current facility-administered medications for this visit.   Drug-Drug Interactions (DDIs) DDIs were evaluated? Yes Significant DDIs? No  The patient was instructed to speak with their health care provider and/or the oral chemotherapy pharmacist before starting any new drug, including prescription or over the counter, natural / herbal products, or vitamins.  Supportive Care Diarrhea: we reviewed that diarrhea is common with abemaciclib  and confirmed that she does have loperamide (Imodium) at home.  We reviewed how to take this medication PRN. Neutropenia: we discussed the importance of having a thermometer and what the Centers for Disease Control and Prevention (CDC) considers a fever which is 100.25F (38C) or higher.  Gave patient 24/7 triage line to call if any fevers or symptoms. ILD/Pneumonitis: we reviewed potential symptoms including cough, shortness, and fatigue.  VTE: reviewed signs of DVT such as leg swelling, redness, pain, or tenderness and signs of PE such as shortness of breath, rapid or irregular heartbeat, cough, chest pain, or lightheadedness. Reviewed to take the medication every 12 hours (with food sometimes can be easier on the stomach) and to take it at the same time every day. Drug interactions with grapefruit products  Dosing Assessment Hepatic adjustments  needed? No  Renal adjustments needed? No  Toxicity adjustments needed? No  The current dosing regimen is appropriate to continue at this time.  Follow-Up Plan Continue abemaciclib  100 by mouth every 12 hours Continue anastrozole  1 mg by mouth daily Continue zoledronic  acid 3.3 mg IV every 12 weeks. Last given 03/05/24, due  today CA 15-3 and CA 27.29 will result tomorrow and go directly to Dr. Odean. Trending down. Serum creatinine ANC Diarrhea Monitor fatigue. Discussed exercise and resting when able. Labs, CT CAP 06/13/24 Dr. Odean visit on 06/19/24 [name] [name] can follow up with clinical pharmacy as deemed necessary by Dr. Mackey Odean going forward   [name] [name] participated in the discussion, expressed understanding, and voiced agreement with the above plan. All questions were answered to her satisfaction. The patient was advised to contact the clinic at [phone] with any questions or concerns prior to her return visit.   I spent 30 minutes assessing and educating the patient.  Baani Bober A. Lucila, PharmD, BCOP, CPP  Norleen DELENA Lucila, RPH-CPP, 05/27/2024  8:55 AM   **Disclaimer: This note was dictated with voice recognition software. Similar sounding words can inadvertently be transcribed and this note may contain transcription errors which may not have been corrected upon publication of note.**

## 2024-05-28 ENCOUNTER — Inpatient Hospital Stay

## 2024-05-28 ENCOUNTER — Inpatient Hospital Stay: Admitting: Pharmacist

## 2024-05-28 VITALS — BP 137/81 | HR 84 | Temp 98.2°F | Resp 18 | Ht 67.0 in | Wt 138.8 lb

## 2024-05-28 DIAGNOSIS — C50212 Malignant neoplasm of upper-inner quadrant of left female breast: Secondary | ICD-10-CM

## 2024-05-28 LAB — CBC WITH DIFFERENTIAL (CANCER CENTER ONLY)
Abs Immature Granulocytes: 0.01 10*3/uL (ref 0.00–0.07)
Basophils Absolute: 0.1 10*3/uL (ref 0.0–0.1)
Basophils Relative: 3 %
Eosinophils Absolute: 0 10*3/uL (ref 0.0–0.5)
Eosinophils Relative: 2 %
HCT: 29.3 % — ABNORMAL LOW (ref 36.0–46.0)
Hemoglobin: 9.9 g/dL — ABNORMAL LOW (ref 12.0–15.0)
Immature Granulocytes: 1 %
Lymphocytes Relative: 23 %
Lymphs Abs: 0.5 10*3/uL — ABNORMAL LOW (ref 0.7–4.0)
MCH: 32.7 pg (ref 26.0–34.0)
MCHC: 33.8 g/dL (ref 30.0–36.0)
MCV: 96.7 fL (ref 80.0–100.0)
Monocytes Absolute: 0.2 10*3/uL (ref 0.1–1.0)
Monocytes Relative: 10 %
Neutro Abs: 1.2 10*3/uL — ABNORMAL LOW (ref 1.7–7.7)
Neutrophils Relative %: 61 %
Platelet Count: 251 10*3/uL (ref 150–400)
RBC: 3.03 MIL/uL — ABNORMAL LOW (ref 3.87–5.11)
RDW: 17 % — ABNORMAL HIGH (ref 11.5–15.5)
WBC Count: 1.9 10*3/uL — ABNORMAL LOW (ref 4.0–10.5)
nRBC: 0 % (ref 0.0–0.2)

## 2024-05-28 LAB — CMP (CANCER CENTER ONLY)
ALT: 25 U/L (ref 0–44)
AST: 27 U/L (ref 15–41)
Albumin: 4.5 g/dL (ref 3.5–5.0)
Alkaline Phosphatase: 123 U/L (ref 38–126)
Anion gap: 14 (ref 5–15)
BUN: 11 mg/dL (ref 8–23)
CO2: 22 mmol/L (ref 22–32)
Calcium: 8.7 mg/dL — ABNORMAL LOW (ref 8.9–10.3)
Chloride: 104 mmol/L (ref 98–111)
Creatinine: 0.88 mg/dL (ref 0.44–1.00)
GFR, Estimated: >60 mL/min
Glucose, Bld: 90 mg/dL (ref 70–99)
Potassium: 3.7 mmol/L (ref 3.5–5.1)
Sodium: 140 mmol/L (ref 135–145)
Total Bilirubin: 0.4 mg/dL (ref 0.0–1.2)
Total Protein: 7.3 g/dL (ref 6.5–8.1)

## 2024-05-28 MED ORDER — ZOLEDRONIC ACID 4 MG/5ML IV CONC
3.3000 mg | Freq: Once | INTRAVENOUS | Status: AC
  Administered 2024-05-28: 3.3 mg via INTRAVENOUS
  Filled 2024-05-28: qty 4.13

## 2024-05-28 MED ORDER — SODIUM CHLORIDE 0.9 % IV SOLN: INTRAVENOUS | Status: DC

## 2024-05-28 NOTE — Patient Instructions (Signed)

## 2024-05-28 NOTE — Progress Notes (Signed)
 Patient reports that her dentist recommended she get a filling replaced.  She has not scheduled this procedure.  Per Dr. Gudena, OK to proceed with Zometa  administration.

## 2024-05-29 LAB — CANCER ANTIGEN 15-3: CA 15-3: 559 U/mL — ABNORMAL HIGH (ref 0.0–25.0)

## 2024-05-29 LAB — CANCER ANTIGEN 27.29: CA 27.29: 427.1 U/mL — ABNORMAL HIGH (ref 0.0–38.6)

## 2024-06-04 ENCOUNTER — Encounter: Payer: Self-pay | Admitting: Pharmacist

## 2024-06-13 ENCOUNTER — Inpatient Hospital Stay: Attending: Hematology and Oncology

## 2024-06-13 ENCOUNTER — Ambulatory Visit (HOSPITAL_COMMUNITY)

## 2024-06-19 ENCOUNTER — Inpatient Hospital Stay: Admitting: Hematology and Oncology

## 2025-01-29 ENCOUNTER — Encounter: Admitting: Family Medicine
